# Patient Record
Sex: Female | Born: 1945 | Race: White | Hispanic: No | Marital: Married | Smoking: Never smoker
Health system: Southern US, Community
[De-identification: ages and names within clinical notes are randomized; demographics above are authoritative.]

## PROBLEM LIST (undated history)

## (undated) DIAGNOSIS — G4730 Sleep apnea, unspecified: Secondary | ICD-10-CM

## (undated) DIAGNOSIS — Z9289 Personal history of other medical treatment: Secondary | ICD-10-CM

## (undated) DIAGNOSIS — M1990 Unspecified osteoarthritis, unspecified site: Secondary | ICD-10-CM

## (undated) DIAGNOSIS — F329 Major depressive disorder, single episode, unspecified: Secondary | ICD-10-CM

## (undated) DIAGNOSIS — K219 Gastro-esophageal reflux disease without esophagitis: Secondary | ICD-10-CM

## (undated) DIAGNOSIS — I509 Heart failure, unspecified: Secondary | ICD-10-CM

## (undated) DIAGNOSIS — E669 Obesity, unspecified: Secondary | ICD-10-CM

## (undated) DIAGNOSIS — I10 Essential (primary) hypertension: Secondary | ICD-10-CM

## (undated) DIAGNOSIS — F32A Depression, unspecified: Secondary | ICD-10-CM

## (undated) DIAGNOSIS — R51 Headache: Secondary | ICD-10-CM

## (undated) DIAGNOSIS — I2510 Atherosclerotic heart disease of native coronary artery without angina pectoris: Secondary | ICD-10-CM

## (undated) HISTORY — DX: Depression, unspecified: F32.A

## (undated) HISTORY — DX: Gastro-esophageal reflux disease without esophagitis: K21.9

## (undated) HISTORY — PX: CARDIAC CATHETERIZATION: SHX172

## (undated) HISTORY — DX: Obesity, unspecified: E66.9

## (undated) HISTORY — DX: Atherosclerotic heart disease of native coronary artery without angina pectoris: I25.10

## (undated) HISTORY — DX: Personal history of other medical treatment: Z92.89

## (undated) HISTORY — PX: TOTAL ABDOMINAL HYSTERECTOMY: SHX209

## (undated) HISTORY — DX: Essential (primary) hypertension: I10

## (undated) HISTORY — PX: BACK SURGERY: SHX140

## (undated) HISTORY — DX: Major depressive disorder, single episode, unspecified: F32.9

---

## 1998-05-31 ENCOUNTER — Other Ambulatory Visit: Admission: RE | Admit: 1998-05-31 | Discharge: 1998-05-31 | Payer: Self-pay | Admitting: Obstetrics and Gynecology

## 1999-04-14 ENCOUNTER — Other Ambulatory Visit: Admission: RE | Admit: 1999-04-14 | Discharge: 1999-04-14 | Payer: Self-pay | Admitting: Obstetrics and Gynecology

## 2000-05-05 ENCOUNTER — Other Ambulatory Visit: Admission: RE | Admit: 2000-05-05 | Discharge: 2000-05-05 | Payer: Self-pay | Admitting: Obstetrics and Gynecology

## 2000-05-16 ENCOUNTER — Other Ambulatory Visit: Admission: RE | Admit: 2000-05-16 | Discharge: 2000-05-16 | Payer: Self-pay | Admitting: Obstetrics and Gynecology

## 2000-05-16 ENCOUNTER — Encounter (INDEPENDENT_AMBULATORY_CARE_PROVIDER_SITE_OTHER): Payer: Self-pay | Admitting: Specialist

## 2000-06-30 ENCOUNTER — Encounter (INDEPENDENT_AMBULATORY_CARE_PROVIDER_SITE_OTHER): Payer: Self-pay | Admitting: Specialist

## 2000-06-30 ENCOUNTER — Other Ambulatory Visit: Admission: RE | Admit: 2000-06-30 | Discharge: 2000-06-30 | Payer: Self-pay | Admitting: *Deleted

## 2000-09-08 ENCOUNTER — Encounter: Payer: Self-pay | Admitting: *Deleted

## 2000-09-14 ENCOUNTER — Encounter (INDEPENDENT_AMBULATORY_CARE_PROVIDER_SITE_OTHER): Payer: Self-pay | Admitting: Specialist

## 2000-09-14 ENCOUNTER — Inpatient Hospital Stay (HOSPITAL_COMMUNITY): Admission: RE | Admit: 2000-09-14 | Discharge: 2000-09-15 | Payer: Self-pay | Admitting: *Deleted

## 2001-06-16 ENCOUNTER — Encounter: Payer: Self-pay | Admitting: Family Medicine

## 2001-06-16 ENCOUNTER — Inpatient Hospital Stay (HOSPITAL_COMMUNITY): Admission: AD | Admit: 2001-06-16 | Discharge: 2001-06-18 | Payer: Self-pay | Admitting: Family Medicine

## 2001-06-18 ENCOUNTER — Encounter: Payer: Self-pay | Admitting: Family Medicine

## 2001-08-21 ENCOUNTER — Ambulatory Visit (HOSPITAL_COMMUNITY): Admission: RE | Admit: 2001-08-21 | Discharge: 2001-08-21 | Payer: Self-pay | Admitting: Gastroenterology

## 2001-11-01 ENCOUNTER — Other Ambulatory Visit: Admission: RE | Admit: 2001-11-01 | Discharge: 2001-11-01 | Payer: Self-pay | Admitting: *Deleted

## 2003-07-01 ENCOUNTER — Encounter: Admission: RE | Admit: 2003-07-01 | Discharge: 2003-09-29 | Payer: Self-pay | Admitting: Family Medicine

## 2006-08-25 DIAGNOSIS — I2510 Atherosclerotic heart disease of native coronary artery without angina pectoris: Secondary | ICD-10-CM

## 2006-08-25 HISTORY — PX: CORONARY STENT PLACEMENT: SHX1402

## 2006-08-25 HISTORY — DX: Atherosclerotic heart disease of native coronary artery without angina pectoris: I25.10

## 2006-09-06 ENCOUNTER — Ambulatory Visit: Payer: Self-pay | Admitting: Internal Medicine

## 2006-09-06 ENCOUNTER — Observation Stay (HOSPITAL_COMMUNITY): Admission: EM | Admit: 2006-09-06 | Discharge: 2006-09-09 | Payer: Self-pay | Admitting: Emergency Medicine

## 2006-09-21 ENCOUNTER — Ambulatory Visit: Payer: Self-pay | Admitting: Cardiology

## 2006-10-17 ENCOUNTER — Observation Stay (HOSPITAL_COMMUNITY): Admission: EM | Admit: 2006-10-17 | Discharge: 2006-10-19 | Payer: Self-pay | Admitting: Emergency Medicine

## 2006-10-17 ENCOUNTER — Ambulatory Visit: Payer: Self-pay | Admitting: Cardiology

## 2006-12-07 ENCOUNTER — Ambulatory Visit: Payer: Self-pay | Admitting: Internal Medicine

## 2007-07-16 ENCOUNTER — Ambulatory Visit (HOSPITAL_COMMUNITY): Admission: RE | Admit: 2007-07-16 | Discharge: 2007-07-16 | Payer: Self-pay | Admitting: Ophthalmology

## 2007-12-06 ENCOUNTER — Emergency Department (HOSPITAL_COMMUNITY): Admission: EM | Admit: 2007-12-06 | Discharge: 2007-12-07 | Payer: Self-pay | Admitting: Emergency Medicine

## 2009-04-16 ENCOUNTER — Telehealth (INDEPENDENT_AMBULATORY_CARE_PROVIDER_SITE_OTHER): Payer: Self-pay | Admitting: *Deleted

## 2010-04-14 ENCOUNTER — Encounter: Admission: RE | Admit: 2010-04-14 | Discharge: 2010-04-14 | Payer: Self-pay | Admitting: Endocrinology

## 2010-09-21 ENCOUNTER — Encounter: Payer: Self-pay | Admitting: Cardiology

## 2010-10-06 ENCOUNTER — Encounter: Payer: Self-pay | Admitting: Cardiology

## 2010-10-06 ENCOUNTER — Ambulatory Visit (INDEPENDENT_AMBULATORY_CARE_PROVIDER_SITE_OTHER): Payer: PRIVATE HEALTH INSURANCE | Admitting: Cardiology

## 2010-10-06 DIAGNOSIS — R0602 Shortness of breath: Secondary | ICD-10-CM | POA: Insufficient documentation

## 2010-10-06 DIAGNOSIS — I2510 Atherosclerotic heart disease of native coronary artery without angina pectoris: Secondary | ICD-10-CM | POA: Insufficient documentation

## 2010-10-06 DIAGNOSIS — I10 Essential (primary) hypertension: Secondary | ICD-10-CM

## 2010-10-06 DIAGNOSIS — I509 Heart failure, unspecified: Secondary | ICD-10-CM

## 2010-10-09 ENCOUNTER — Encounter: Payer: Self-pay | Admitting: Cardiology

## 2010-10-10 LAB — CONVERTED CEMR LAB
BUN: 16 mg/dL (ref 6–23)
CO2: 24 meq/L (ref 19–32)
Chloride: 104 meq/L (ref 96–112)
Creatinine, Ser: 0.79 mg/dL (ref 0.40–1.20)
Glucose, Bld: 80 mg/dL (ref 70–99)
Potassium: 4 meq/L (ref 3.5–5.3)

## 2010-10-12 ENCOUNTER — Telehealth: Payer: Self-pay | Admitting: Cardiology

## 2010-10-12 NOTE — Assessment & Plan Note (Signed)
Summary: np6/cad,sob,flutter/inclusive health/dr. Darl Pikes fuller per jes...  Medications Added CARVEDILOL 6.25 MG TABS (CARVEDILOL) Take one tablet by mouth twice a day AMLODIPINE BESYLATE 5 MG TABS (AMLODIPINE BESYLATE) Take one tablet by mouth daily FUROSEMIDE 40 MG TABS (FUROSEMIDE) Take one tablet by mouth daily for 5 days, then DECREASE to 1/2 tablet by mouth once daily. POTASSIUM CHLORIDE CR 10 MEQ CR-CAPS (POTASSIUM CHLORIDE) Take 2 tablets by mouth once daily for 5 days, then DECREASE to 1 tablet by mouth once daily.      Allergies Added: NKDA  Visit Type:  Initial Consult Primary Provider:  [name], P.A.   CC:  c/o fatigue, shortness of breath, and fluttering in chest with swelling in feet and face.Marland Kitchen  History of Present Illness: 65 yo with history of CAD, DM, HTN presents for evaluation of dyspnea, orthopnea, and lower extremity edema.  She had unstable angina in 2/08 with a stent placed in her distal CFX.  She has not had cardiology followup since 2008.  She did not have heart failure at that time, symptom with unstable angina was chest pain.    She has had exertional dyspnea for several months now but much worse for 2-3 weeks.  [name] short of breath walking from one room to the next at this point.  She has had significant orthopnea for the last 2 weeks and sometimes sleeps sitting up.  She has episodes consistent with PND.  She has had increased swelling in her legs for the last 2-3 weeks.  She gets occasional fleeting atypical chest pain but nothing prolonged or associated with exertion.   Blood pressure today is elevated at 160/64 and has been running high at her last few visits with doctors.  She does not check it at home. She has had a cough for about a month.    ECG: NSR, lateral T wave flattening, HR 58  Preventive Screening-Counseling & Management  Alcohol-Tobacco     Smoking Status: never  Caffeine-Diet-Exercise     Does Patient Exercise: no  Current Medications  (verified): 1)  Aspirin 325 Mg Tabs (Aspirin) .Marland Kitchen.. 1 Tablet Once Daily 2)  Actos 15 Mg Tabs (Pioglitazone Hcl) .Marland Kitchen.. 1 Tablet Once Daily 3)  Ropinirole Hcl 2 Mg Tabs (Ropinirole Hcl) .Marland Kitchen.. 1 Tablet Once Daily 4)  Metformin Hcl 1000 Mg Tabs (Metformin Hcl) .Marland Kitchen.. 1 Tablet Two Times A Day 5)  Lisinopril 40 Mg Tabs (Lisinopril) .Marland Kitchen.. 1 Tablet Once Daily 6)  Lyrica 75 Mg Caps (Pregabalin) .Marland Kitchen.. 1 Capsule Two Times A Day, For 30 Days 7)  Simvastatin 40 Mg Tabs (Simvastatin) .Marland Kitchen.. 1 Tablet At Bedtime 8)  Humalog 100 Unit/ml Soln (Insulin Lispro (Human)) .... As Directed 9)  Ibuprofen 800 Mg Tabs (Ibuprofen) .Marland Kitchen.. 1 Tablet As Needed 10)  Coreg 12.5 Mg Tabs (Carvedilol) .Marland Kitchen.. 1 Tablet Once Daily For 30 Days 11)  Lantus 100 Unit/ml Soln (Insulin Glargine) .... As Directed 12)  Zolpidem Tartrate 10 Mg Tabs (Zolpidem Tartrate) .Marland Kitchen.. 1 Tablet At Bedtime  Allergies (verified): No Known Drug Allergies  Past History:  Family History: Last updated: 10-10-10 Father: Deceased; MI in his 79s, CABG, CHF Mother: Alive; HTN, rhematoid arthritis, anemic  Social History: Last updated: 10/10/2010 Married, lives with husband in [place] disabled: diabetes & fibromyagia Tobacco Use - No.  Alcohol Use - no Regular Exercise - no  Risk Factors: Exercise: no (10/10/10)  Risk Factors: Smoking Status: never (10/10/10)  Past Medical History: 1.  Hypertension 2.  Depression 3.  DM 4.  Obesity 5.  GERD 6.  CAD: Unstable angina 2/08.  PCI to distal codominant CFX with 2.5 x 20 Taxus DES.   Past Surgical History: CAD; stent placed @ [facility] 08/2006  Family History: Father: Deceased; MI in his 35s, CABG, CHF Mother: Alive; HTN, rhematoid arthritis, anemic  Social History: Married, lives with husband in [place] disabled: diabetes & fibromyagia Tobacco Use - No.  Alcohol Use - no Regular Exercise - no Smoking Status:  never Does Patient Exercise:  no  Review of Systems       All systems  reviewed and negative except as per HPI.   Vital Signs:  Patient profile:   65 year old female Height:      65 inches Weight:      273 pounds BMI:     45.59 Pulse rate:   58 / minute BP sitting:   160 / 64  (left arm) Cuff size:   large  Vitals Entered By: Bishop Dublin, CMA (October 06, 2010 3:27 PM)  Physical Exam  General:  Well developed, well nourished, in no acute distress.  Obese.  Head:  normocephalic and atraumatic Nose:  no deformity, discharge, inflammation, or lesions Mouth:  Teeth, gums and palate normal. Oral mucosa normal. Neck:  Neck supple, JVP 14 cm. No masses, thyromegaly or abnormal cervical nodes. Lungs:  Crackles at bases bilaterally.  Heart:  Non-displaced PMI, chest non-tender; regular rate and rhythm, S1, S2 without rubs or gallops. 3/6 HSM at apex.  Carotid upstroke normal, no bruit. Pedals normal pulses. 1+ edema to knees bilaterally.  Abdomen:  Bowel sounds positive; abdomen soft and non-tender without masses, organomegaly, or hernias noted. No hepatosplenomegaly. Extremities:  No clubbing or cyanosis. Neurologic:  Alert and oriented x 3. Skin:  Intact without lesions or rashes. Psych:  Normal affect.   Impression & Recommendations:  Problem # 1:  CHF (ICD-428.0) Patient is volume overloaded today with NYHA class III symptoms.  Symptoms have come on gradually over the last few months but much worse last 2 weeks.  No rest symptoms.  She has only mild atypical chest pain.  She has what sounds like a mitral area murmur.  - Needs echocardiogram.  - Start Lasix 40 mg daily x 5 days + KCl 20 mEq daily x 5 days.  After 5 days, decrease Lasix to 20 mg daily and KCl to 10 mEq daily.  - BMET, BNP, TSH today; BMET/BNP in 2 wks.   - Change Coreg dosing to 6.25 mg two times a day (she has been taking 12.5 mg once daily).   - Continue ACEI.   Problem # 2:  CAD (ICD-414.00) Patient has history of CAD s/p PCI in 2/08.  She has had only mild, fleeting atypical chest  pain.  However, given her new onset CHF, I would be concerned for the possibility of ischemic cardiomyopathy.  I will get a Lexiscan myoview to assess for ischemia.  She will continue ASA and statin.  I will get lipids/LFTs with goal LDL < 70.   Problem # 3:  HYPERTENSION, UNSPECIFIED (ICD-401.9) BP running high.  Divide Coreg to 6.25 mg two times a day and continue lisinopril.  I will also add amlodipine 5 mg daily.    Followup in 2 weeks.  Go to the ER if symptoms worsen.   Other Orders: Nuclear Stress Test (Nuc Stress Test) Echocardiogram (Echo)  Patient Instructions: 1)  Your physician recommends that you schedule a follow-up appointment in: 2 WEEKS 2)  Your physician recommends that you return for a FASTING lipid profile: Lipid/Lft/BMP/BNP IN 2 WEEKS 3)  Your physician has recommended you make the following change in your medication: DECREASE  Coreg to 6.25 two times a day. START Amlodipine 5mg  once daily. START Lasix 40mg  1/2 tablet once daily x 5 days then 1 tablet once daily. START Potassium 2 tablets by mouth x 5 days then DECREASE to 1 tablet once daily. 4)  Your physician has requested that you have an echocardiogram.  Echocardiography is a painless test that uses sound waves to create images of your heart. It provides your doctor with information about the size and shape of your heart and how well your heart's chambers and valves are working.  This procedure takes approximately one hour. There are no restrictions for this procedure. 5)  Your physician has requested that you have an exercise stress myoview.  For further information please visit [email or web address]  Please follow instruction sheet, as given. Prescriptions: POTASSIUM CHLORIDE CR 10 MEQ CR-CAPS (POTASSIUM CHLORIDE) Take 2 tablets by mouth once daily for 5 days, then DECREASE to 1 tablet by mouth once daily.  #30 x 6   Entered by:   Lanny Hurst RN   Authorized by:   [name], MD   Signed by:   Lanny Hurst RN  on 10/06/2010   Method used:   Electronically to        [facility] (retail)       [address]       [place], [place]  [address]       Ph: [phone]       Fax: [phone]   RxID:   [id number] FUROSEMIDE 40 MG TABS (FUROSEMIDE) Take one tablet by mouth daily for 5 days, then DECREASE to 1/2 tablet by mouth once daily.  #30 x 6   Entered by:   Lanny Hurst RN   Authorized by:   [name], MD   Signed by:   Lanny Hurst RN on 10/06/2010   Method used:   Electronically to        [facility] (retail)       [address]       [place], [place]  [address]       Ph: [phone]       Fax: [phone]   RxID:   [id number] AMLODIPINE BESYLATE 5 MG TABS (AMLODIPINE BESYLATE) Take one tablet by mouth daily  #30 x 6   Entered by:   Lanny Hurst RN   Authorized by:   [name], MD   Signed by:   Lanny Hurst RN on 10/06/2010   Method used:   Electronically to        [facility] (retail)       [address]       [place], [place]  [address]       Ph: [phone]       Fax: [phone]   RxID:   [id number] CARVEDILOL 6.25 MG TABS (CARVEDILOL) Take one tablet by mouth twice a day  #60 x 6   Entered by:   Lanny Hurst RN   Authorized by:   [name], MD   Signed by:   Lanny Hurst RN on 10/06/2010   Method used:   Electronically to  [facility]       [address]       [place], [place]  [address]       Ph: [phone]       Fax: [phone]   RxID:   [id number]   Appended Document: Orders Update    Clinical Lists Changes  Problems: Added new problem of SHORTNESS OF BREATH (ICD-786.05) Orders: Added new Test order of T-Basic Metabolic Panel [phone]) - Signed Added new Test order of T-BNP  (B Natriuretic Peptide) [phone]) - Signed Added new Test order of T-TSH ([id number]) - Signed

## 2010-10-12 NOTE — Telephone Encounter (Signed)
Patient: Krista Wise Note: All result statuses are Final unless otherwise noted.  Tests: (1) B Natriuretic Peptide ([id number])  B Natriuretic Peptide                        [H]  417.5 pg/mL                 0.0-100.0  Note: An exclamation mark (!) indicates a result that was not dispersed into the flowsheet. Document Creation Date: 10/07/2010 11:18 AM _______________________________________________________________________  (1) Order result status: Final Collection or observation date-time: 10/06/2010 23:00 Requested date-time: 10/06/2010 00:00 Receipt date-time: 10/06/2010 23:00 Reported date-time: 10/07/2010 11:18 Referring Physician:   Ordering Physician:  Gala Romney ([id number]) Specimen Source:  Source: Ursula Alert Order Number: [id number] Lab site: [facility], [facility]     [address]     [place]  [place]  [address]   Signed by Dolores Patty, MD, [facility] on 10/10/2010 at 11:12 AM  Signed by [name], MD on 10/10/2010 at 3:39 PM ________________________________________________________________________ elevated BNP, started lasix.    Signed by [name], MD on 10/10/2010 at 3:39 PM  ________________________________________________________________________     Patient: Krista Wise Note: All result statuses are Final unless otherwise noted.  Tests: (1) TSH (23280)   TSH                  [H]  5.694 uIU/mL                0.350-4.500  Note: An exclamation mark (!) indicates a result that was not dispersed into the flowsheet. Document Creation Date: 10/07/2010 11:18 AM _______________________________________________________________________  (1) Order result status: Final Collection or observation date-time: 10/06/2010 23:00 Requested date-time: 10/06/2010 00:00 Receipt date-time: 10/06/2010 23:00 Reported date-time: 10/07/2010 11:18 Referring Physician:   Ordering Physician:  Gala Romney ([id number]) Specimen Source:  Source:  Ursula Alert Order Number: [id number] Lab site: [facility], [facility]     [address]     [place]  [place]  [address]   Signed by Dolores Patty, MD, [facility] on 10/10/2010 at 11:13 AM  Signed by [name], MD on 10/10/2010 at 3:39 PM ________________________________________________________________________ needs free T3 and free T4, also repeat TSH   Signed by [name], MD on 10/10/2010 at 3:40 PM  ________________________________________________________________________    Patient: Krista Wise Note: All result statuses are Final unless otherwise noted.  Tests: (1) B Natriuretic Peptide (56387)  B Natriuretic Peptide                        [H]  417.5 pg/mL                 0.0-100.0  Note: An exclamation mark (!) indicates a result that was not dispersed into the flowsheet. Document Creation Date: 10/07/2010 11:18 AM _______________________________________________________________________  (1) Order result status: Final Collection or observation date-time: 10/06/2010 23:00 Requested date-time: 10/06/2010 00:00 Receipt date-time: 10/06/2010 23:00 Reported date-time: 10/07/2010 11:18 Referring Physician:   Ordering Physician:  Gala Romney ([id number]) Specimen Source:  Source: Ursula Alert Order Number: [id number] Lab site: [facility], [facility]     [address]     [place]  [place]  [address]   Signed by Dolores Patty, MD, [facility] on 10/10/2010 at 11:12 AM  Signed by [name], MD on 10/10/2010 at 3:39 PM ________________________________________________________________________ elevated BNP, started lasix.    Signed by [name], MD on 10/10/2010  at 3:39 PM  ________________________________________________________________________

## 2010-10-15 NOTE — Telephone Encounter (Signed)
Free T3, T4, and TSH added to pt's bloodwork.

## 2010-10-19 ENCOUNTER — Other Ambulatory Visit (HOSPITAL_COMMUNITY): Payer: Self-pay | Admitting: Cardiology

## 2010-10-19 DIAGNOSIS — I509 Heart failure, unspecified: Secondary | ICD-10-CM

## 2010-10-20 ENCOUNTER — Ambulatory Visit (HOSPITAL_BASED_OUTPATIENT_CLINIC_OR_DEPARTMENT_OTHER): Payer: PRIVATE HEALTH INSURANCE | Admitting: Radiology

## 2010-10-20 ENCOUNTER — Ambulatory Visit (HOSPITAL_COMMUNITY): Payer: PRIVATE HEALTH INSURANCE | Attending: Cardiology | Admitting: Radiology

## 2010-10-20 ENCOUNTER — Other Ambulatory Visit: Payer: PRIVATE HEALTH INSURANCE | Admitting: *Deleted

## 2010-10-20 ENCOUNTER — Other Ambulatory Visit (INDEPENDENT_AMBULATORY_CARE_PROVIDER_SITE_OTHER): Payer: PRIVATE HEALTH INSURANCE | Admitting: *Deleted

## 2010-10-20 DIAGNOSIS — I2510 Atherosclerotic heart disease of native coronary artery without angina pectoris: Secondary | ICD-10-CM

## 2010-10-20 DIAGNOSIS — I509 Heart failure, unspecified: Secondary | ICD-10-CM

## 2010-10-20 DIAGNOSIS — E119 Type 2 diabetes mellitus without complications: Secondary | ICD-10-CM

## 2010-10-20 DIAGNOSIS — R0609 Other forms of dyspnea: Secondary | ICD-10-CM

## 2010-10-20 DIAGNOSIS — R011 Cardiac murmur, unspecified: Secondary | ICD-10-CM

## 2010-10-20 DIAGNOSIS — R0989 Other specified symptoms and signs involving the circulatory and respiratory systems: Secondary | ICD-10-CM

## 2010-10-20 DIAGNOSIS — R0789 Other chest pain: Secondary | ICD-10-CM

## 2010-10-20 LAB — HEPATIC FUNCTION PANEL
AST: 15 U/L (ref 0–37)
Alkaline Phosphatase: 76 U/L (ref 39–117)
Bilirubin, Direct: 0.1 mg/dL (ref 0.0–0.3)
Total Protein: 7.1 g/dL (ref 6.0–8.3)

## 2010-10-20 LAB — BASIC METABOLIC PANEL
BUN: 18 mg/dL (ref 6–23)
CO2: 30 mEq/L (ref 19–32)
Chloride: 103 mEq/L (ref 96–112)
Creatinine, Ser: 0.7 mg/dL (ref 0.4–1.2)
Glucose, Bld: 193 mg/dL — ABNORMAL HIGH (ref 70–99)
Potassium: 4.4 mEq/L (ref 3.5–5.1)

## 2010-10-20 LAB — CBC WITH DIFFERENTIAL/PLATELET
Eosinophils Absolute: 0.2 10*3/uL (ref 0.0–0.7)
Eosinophils Relative: 3.1 % (ref 0.0–5.0)
HCT: 44 % (ref 36.0–46.0)
Lymphs Abs: 0.9 10*3/uL (ref 0.7–4.0)
MCHC: 32.7 g/dL (ref 30.0–36.0)
MCV: 81.2 fl (ref 78.0–100.0)
Monocytes Absolute: 0.4 10*3/uL (ref 0.1–1.0)
Neutrophils Relative %: 75.8 % (ref 43.0–77.0)
Platelets: 190 10*3/uL (ref 150.0–400.0)
RDW: 16.3 % — ABNORMAL HIGH (ref 11.5–14.6)
WBC: 6.5 10*3/uL (ref 4.5–10.5)

## 2010-10-20 LAB — LIPID PANEL
Cholesterol: 167 mg/dL (ref 0–200)
Total CHOL/HDL Ratio: 4

## 2010-10-20 LAB — LDL CHOLESTEROL, DIRECT: Direct LDL: 96.5 mg/dL

## 2010-10-20 MED ORDER — REGADENOSON 0.4 MG/5ML IV SOLN
0.4000 mg | Freq: Once | INTRAVENOUS | Status: AC
  Administered 2010-10-20: 0.4 mg via INTRAVENOUS

## 2010-10-20 MED ORDER — TECHNETIUM TC 99M TETROFOSMIN IV KIT
33.0000 | PACK | Freq: Once | INTRAVENOUS | Status: AC | PRN
  Administered 2010-10-20: 33 via INTRAVENOUS

## 2010-10-20 MED ORDER — TECHNETIUM TC 99M TETROFOSMIN IV KIT
11.0000 | PACK | Freq: Once | INTRAVENOUS | Status: AC | PRN
  Administered 2010-10-20: 11 via INTRAVENOUS

## 2010-10-20 NOTE — Progress Notes (Signed)
[facility] SITE 3 NUCLEAR MED [address] [place] [address] [phone]  Cardiology Nuclear Med Study Krista Wise female 1946/07/23   Nuclear Med Background Indication for Stress Test:  Evaluation for Ischemia and Stent Patency History:  2/08 Stent-CFX, 3/08 CATH:patent stent, EF=65% Cardiac Risk Factors: Family History - CAD, Hypertension, IDDM Type 2, Lipids and Obesity  Symptoms:  Chest Tightness with and without Exertion (last date of chest discomfort was 3-4 days ago), Diaphoresis, Dizziness, DOE, Fatigue, Near Syncope and Rapid HR   Nuclear Pre-Procedure Caffeine/Decaff Intake:  7:00pm NPO After: 8:00pm   Lungs:  Clear IV 0.9% NS with Angio Cath:  20g  IV Site: R Antecubital  IV Started by:  Stanton Kidney, EMT-P  Chest Size (in):  44 Cup Size:C+  Height: 5\' 5"  (1.651 m)  Weight:  265 lb (120.203 kg)  BMI:  Body mass index is 44.10 kg/(m^2). Tech Comments:  n/a    Nuclear Med Study 1 or 2 day study: 1 day  Stress Test Type:  Lexiscan  Reading MD: Cassell Clement, MD  Order Authorizing Provider: [name], MD  Resting Radionuclide: Technetium 16m Tetrofosmin  Resting Radionuclide Dose: 11 mCi   Stress Radionuclide:  Technetium 1m Tetrofosmin  Stress Radionuclide Dose: 33 mCi           Stress Protocol Rest HR: 69 Stress HR: 114  Rest BP: 160/65 Stress BP: 189/78  Exercise Time:  4:21 min METS: 4.5  Predicted HR: 158 % of Maximum: 72%        Dose of Adenosine:  n/a Dose of Lexiscan:  0.4 mg  Dose of Atropine:  n/a Dose of Dobutamine:  n/a  Stress Test Technologist: Rea [employer], CMA-N  Nuclear Technologist:  Doyne Keel, CNMT     Rest Procedure:  Myocardial perfusion imaging was performed at rest 45 minutes following the intravenous administration of Technetium 51m Tetrofosmin. Rest ECG: No acute changes.  Stress Procedure:  The patient attempted to walk the treadmill utilizing the Bruce protocol for 3:15, but was unable to  obtain her target heart rate due to knee pain and dyspnea.  Her O2 sat  was 88%.  She was then given IV Lexiscan 0.4 mg over 15-seconds with concurrent low level exercise and then Technetium 108m Tetrofosmin was injected at 30-seconds while the patient continued walking   There were nonspecific ST-T wave changes prior to giving Lexiscan.  She did c/o chest tightness, 6/10.  Quantitative spect images were obtained after a 45-minute delay.  Discussed images with Dr. Shirlee Latch and patient is scheduled to see him tomorrow, 10/21/10. Stress ECG: Significant ST abnormalities consistent with ischemia.  QPS Raw Data Images:  Normal; no motion artifact; normal heart/lung ratio. Stress Images:  There is decreased uptake in the lateral wall. Rest Images:  There is decreased uptake in the lateral wall. Subtraction (SDS):  These findings are consistent with ischemia.  Findings Risk Category:  High risk nuclear study. Clinically Abnormal:  Chest Pain Ischemia:  lateral Fixed Defect:  Lateral LV Dysfunction:  Yes Transient Ischemic Dilatation (Normal <1.22):  1.17 Lung/Heart Ratio (Normal <0.45):  0.35  Quantitative Gated Spect Images QGS EDV:  128 ml QGS ESV:  49 ml QGS cine images:  No wall motion abnormalities QGS EF:  62 %  Impression Exercise Capacity:  Lexiscan with low level exercise. BP Response:  Normal blood pressure response. Clinical Symptoms:  Significant chest pain. ECG Impression:  Significant ST abnormalities consistent with ischemia. Comparison with Prior  Nuclear Study: No previous nuclear study performed  Overall Impression:  High risk stress nuclear study.  There is a small fixed lateral defect with significant surrounding lateral reversible ischemia.

## 2010-10-21 ENCOUNTER — Ambulatory Visit (INDEPENDENT_AMBULATORY_CARE_PROVIDER_SITE_OTHER): Payer: PRIVATE HEALTH INSURANCE | Admitting: Cardiology

## 2010-10-21 ENCOUNTER — Encounter: Payer: Self-pay | Admitting: Cardiology

## 2010-10-21 ENCOUNTER — Encounter: Payer: Self-pay | Admitting: *Deleted

## 2010-10-21 DIAGNOSIS — I10 Essential (primary) hypertension: Secondary | ICD-10-CM

## 2010-10-21 DIAGNOSIS — I509 Heart failure, unspecified: Secondary | ICD-10-CM

## 2010-10-21 DIAGNOSIS — E785 Hyperlipidemia, unspecified: Secondary | ICD-10-CM

## 2010-10-21 DIAGNOSIS — I2510 Atherosclerotic heart disease of native coronary artery without angina pectoris: Secondary | ICD-10-CM

## 2010-10-21 LAB — PROTIME-INR: Prothrombin Time: 11.4 s (ref 10.2–12.4)

## 2010-10-21 MED ORDER — ATORVASTATIN CALCIUM 40 MG PO TABS: 40.0000 mg | ORAL_TABLET | Freq: Every day | ORAL | Status: DC

## 2010-10-21 MED ORDER — CARVEDILOL 12.5 MG PO TABS: 12.5000 mg | ORAL_TABLET | Freq: Two times a day (BID) | ORAL | Status: DC

## 2010-10-21 NOTE — Assessment & Plan Note (Signed)
Goal LDL < 70.  She was above goal when cholesterol was checked in 3/12.  Need to replace simvastatin with atorvastatin 40 mg daily.  Lipids/LFTs in 2 months.

## 2010-10-21 NOTE — Assessment & Plan Note (Signed)
Significant ischemia on myoview in the lateral wall.  I suspect that this is a major factor in her exertional dyspnea.  She has known CAD s/p prior PCI.  She will continue ASA.  I am going to change simvastatin to Lipitor and will also increase Coreg to 12.5 mg bid.  [name] taking lisinopril.  She has no rest symptoms (only exertional).  I am going to take her for left heart cath next Tuesday.  Will use radial approach.

## 2010-10-21 NOTE — Patient Instructions (Addendum)
Stop Simvastatin.  Start Lipitor 40mg  daily.  Increase Coreg(carvedilol) to 12.5mg  twice a day.  Cardiac Cath Tuesday April 3,2012. See instruction sheet given to you today.  Lab today--PT 414.01   401.9  428.0  Appointment with Dr Shirlee Latch in about 3 weeks.

## 2010-10-21 NOTE — Assessment & Plan Note (Signed)
Diastolic CHF with preserved LV systolic function but moderate pulmonary hypertension.  Weight is down 7 lbs with Lasix and volume status looks better (no JVD now).  I suspect that her ongoing dyspnea with exertion is due to ischemia.  I will arrange for left heart cath.

## 2010-10-21 NOTE — Progress Notes (Signed)
65 yo with history of CAD, DM, HTN, and diastolic CHF presents for followup of dyspnea, orthopnea, and lower extremity edema.  She had unstable angina in 2/08 with a stent placed in her distal CFX.  I saw her recently in the office due to severe exertional dyspnea (short of breath just walking around her house).  She was volume overloaded on exam and hypertensive.  I uptitrated her BP meds and started Lasix.  She was set up for myoview and echo.  The myoview was ischemic by both ECG and perfusion images.  There was significant lateral ischemia.  Echo showed preserved EF (55-60%), but there was moderate pulmonary hypertension suggesting likely diastolic LV dysfunction.   Lasix has helped a lot with Krista Wise's swelling.  She has lost 7 lbs.  [name] still, however, short of breath just walking around her house.  She has orthopnea.  She gets occasional sharp atypical chest pain that lasts for only a few seconds at a time.  She has had no significant symptoms at rest.   Labs (3/12): BNP 417 => 309, K 4.4, creatinine 0.7, HCT 44, free T3/T4 normal, TSH normal, HDL 38, LDL 96.5  Allergies (verified):  No Known Drug Allergies  Family History: Father: Deceased; MI in his 67s, CABG, CHF Mother: Alive; HTN, rhematoid arthritis, anemic  Social History: Married, lives with husband in [place] disabled: diabetes & fibromyagia Tobacco Use - No.  Alcohol Use - no Regular Exercise - no  Past Medical History: 1.  Hypertension 2.  Depression 3.  DM 4.  Obesity 5.  GERD 6.  CAD: Unstable angina 2/08.  PCI to distal codominant CFX with 2.5 x 20 Taxus DES.  Lexiscan myoview (3/12): chest pain and dyspnea with stress, EF 62%, small fixed lateral perfusion defect with significant surrounding reversible perfusion defect suggestive of significant ischemia, ischemic ECG response.   7.  Fibromyalgia 8.  Diastolic CHF: Echo (3/12) with EF 55-60%, mild LV hypertrophy, mild aortic stenosis (mean gradient 13  mmHg), mild MR, normal RV size and systolic function, PA systolic pressure 55 mmHg.  9.  Aortic stenosis: Mild by echo 3/12, mean gradient 13 mmHg.   Review of Systems        All systems reviewed and negative except as per HPI.   Current Outpatient Prescriptions  Medication Sig Dispense Refill  . amLODipine (NORVASC) 5 MG tablet Take 5 mg by mouth daily.        Marland Kitchen aspirin 325 MG tablet Take 325 mg by mouth daily.        . furosemide (LASIX) 40 MG tablet Take 40 mg by mouth. Take 1 tablet daily for 5 days, the DECREASE to 1/2 tablet once daily       . ibuprofen (ADVIL,MOTRIN) 800 MG tablet Take 800 mg by mouth as needed.        . insulin glargine (LANTUS) 100 UNIT/ML injection Inject into the skin as directed.        . insulin lispro (HUMALOG) 100 UNIT/ML injection Inject into the skin as directed.        Marland Kitchen lisinopril (PRINIVIL,ZESTRIL) 40 MG tablet Take 40 mg by mouth daily.        . metFORMIN (GLUCOPHAGE) 1000 MG tablet Take 1,000 mg by mouth 2 (two) times daily.        . pioglitazone (ACTOS) 15 MG tablet Take 15 mg by mouth daily.        . potassium chloride (KLOR-CON) 10 MEQ CR tablet Take  10 mEq by mouth. Take 2 tablets for 5 days, then DECREASE to 1 tablet once daily.       . pregabalin (LYRICA) 75 MG capsule Take 75 mg by mouth 2 (two) times daily.        Marland Kitchen rOPINIRole (REQUIP) 2 MG tablet Take 2 mg by mouth daily.        Marland Kitchen zolpidem (AMBIEN) 10 MG tablet Take 10 mg by mouth at bedtime as needed.        Marland Kitchen DISCONTD: carvedilol (COREG) 6.25 MG tablet Take 6.25 mg by mouth 2 (two) times daily.        Marland Kitchen DISCONTD: simvastatin (ZOCOR) 40 MG tablet Take 40 mg by mouth at bedtime.        Marland Kitchen atorvastatin (LIPITOR) 40 MG tablet Take 1 tablet (40 mg total) by mouth daily.  30 tablet  3  . carvedilol (COREG) 12.5 MG tablet Take 1 tablet (12.5 mg total) by mouth 2 (two) times daily.  60 tablet  6   No current facility-administered medications for this visit.   Facility-Administered Medications  Ordered in Other Visits  Medication Dose Route Frequency Provider Last Rate Last Dose  . regadenoson (LEXISCAN) injection SOLN 0.4 mg  0.4 mg Intravenous Once [name], MD   0.4 mg at 10/20/10 1214  . technetium tetrofosmin (TC-MYOVIEW) injection 33 milli Curie  33 milli Curie Intravenous Once PRN Cassell Clement, MD   33 milli Curie at 10/20/10 1215    BP 172/75  Pulse 72  Ht 5\' 5"  (1.651 m)  Wt 266 lb 4 oz (120.77 kg)  BMI 44.31 kg/m2 General:  Well developed, well nourished, in no acute distress.  Obese.  Neck:  Neck supple, JVP 7 cm (improved). No masses, thyromegaly or abnormal cervical nodes. Lungs:  Clear bilaterally Heart:  Non-displaced PMI, chest non-tender; regular rate and rhythm, S1, S2 without rubs or gallops. 2/6 early systolic murmur RUSB.  Carotid upstroke normal, no bruit. Pedals normal pulses. Trace ankle edema.  Abdomen:  Bowel sounds positive; abdomen soft and non-tender without masses, organomegaly, or hernias noted. No hepatosplenomegaly. Extremities:  No clubbing or cyanosis. Neurologic:  Alert and oriented x 3. Psych:  Normal affect.

## 2010-10-21 NOTE — Assessment & Plan Note (Signed)
Increasing Coreg to 12.5 mg bid today.

## 2010-10-25 ENCOUNTER — Ambulatory Visit: Payer: Self-pay | Admitting: Cardiology

## 2010-10-26 ENCOUNTER — Ambulatory Visit (HOSPITAL_COMMUNITY)
Admission: RE | Admit: 2010-10-26 | Discharge: 2010-10-27 | Disposition: A | Discharge status: Home / Self Care | Payer: PRIVATE HEALTH INSURANCE | Source: Ambulatory Visit | Attending: Cardiology | Admitting: Cardiology

## 2010-10-26 DIAGNOSIS — I2510 Atherosclerotic heart disease of native coronary artery without angina pectoris: Secondary | ICD-10-CM | POA: Insufficient documentation

## 2010-10-26 DIAGNOSIS — Z794 Long term (current) use of insulin: Secondary | ICD-10-CM | POA: Insufficient documentation

## 2010-10-26 DIAGNOSIS — I2582 Chronic total occlusion of coronary artery: Secondary | ICD-10-CM | POA: Insufficient documentation

## 2010-10-26 DIAGNOSIS — R0609 Other forms of dyspnea: Secondary | ICD-10-CM | POA: Insufficient documentation

## 2010-10-26 DIAGNOSIS — Z01810 Encounter for preprocedural cardiovascular examination: Secondary | ICD-10-CM | POA: Insufficient documentation

## 2010-10-26 DIAGNOSIS — E669 Obesity, unspecified: Secondary | ICD-10-CM | POA: Insufficient documentation

## 2010-10-26 DIAGNOSIS — Z23 Encounter for immunization: Secondary | ICD-10-CM | POA: Insufficient documentation

## 2010-10-26 DIAGNOSIS — E119 Type 2 diabetes mellitus without complications: Secondary | ICD-10-CM | POA: Insufficient documentation

## 2010-10-26 DIAGNOSIS — F329 Major depressive disorder, single episode, unspecified: Secondary | ICD-10-CM | POA: Insufficient documentation

## 2010-10-26 DIAGNOSIS — I10 Essential (primary) hypertension: Secondary | ICD-10-CM | POA: Insufficient documentation

## 2010-10-26 DIAGNOSIS — I359 Nonrheumatic aortic valve disorder, unspecified: Secondary | ICD-10-CM | POA: Insufficient documentation

## 2010-10-26 DIAGNOSIS — K219 Gastro-esophageal reflux disease without esophagitis: Secondary | ICD-10-CM | POA: Insufficient documentation

## 2010-10-26 DIAGNOSIS — F3289 Other specified depressive episodes: Secondary | ICD-10-CM | POA: Insufficient documentation

## 2010-10-26 DIAGNOSIS — I5030 Unspecified diastolic (congestive) heart failure: Secondary | ICD-10-CM | POA: Insufficient documentation

## 2010-10-26 DIAGNOSIS — R0989 Other specified symptoms and signs involving the circulatory and respiratory systems: Secondary | ICD-10-CM | POA: Insufficient documentation

## 2010-10-26 DIAGNOSIS — I509 Heart failure, unspecified: Secondary | ICD-10-CM | POA: Insufficient documentation

## 2010-10-26 DIAGNOSIS — IMO0001 Reserved for inherently not codable concepts without codable children: Secondary | ICD-10-CM | POA: Insufficient documentation

## 2010-10-26 LAB — GLUCOSE, CAPILLARY
Glucose-Capillary: 153 mg/dL — ABNORMAL HIGH (ref 70–99)
Glucose-Capillary: 134 mg/dL — ABNORMAL HIGH (ref 70–99)
Glucose-Capillary: 232 mg/dL — ABNORMAL HIGH (ref 70–99)
Glucose-Capillary: 178 mg/dL — ABNORMAL HIGH (ref 70–99)

## 2010-10-27 DIAGNOSIS — R0989 Other specified symptoms and signs involving the circulatory and respiratory systems: Secondary | ICD-10-CM

## 2010-10-27 DIAGNOSIS — R0609 Other forms of dyspnea: Secondary | ICD-10-CM

## 2010-10-27 LAB — CBC
Hemoglobin: 11 g/dL — ABNORMAL LOW (ref 12.0–15.0)
MCH: 24.9 pg — ABNORMAL LOW (ref 26.0–34.0)
MCHC: 30.6 g/dL (ref 30.0–36.0)
MCV: 81.6 fL (ref 78.0–100.0)

## 2010-10-27 LAB — BASIC METABOLIC PANEL
BUN: 16 mg/dL (ref 6–23)
CO2: 31 mEq/L (ref 19–32)
Calcium: 8.9 mg/dL (ref 8.4–10.5)
Chloride: 99 mEq/L (ref 96–112)
Creatinine, Ser: 0.93 mg/dL (ref 0.4–1.2)
GFR calc Af Amer: >60 mL/min (ref >60)
Glucose, Bld: 197 mg/dL — ABNORMAL HIGH (ref 70–99)

## 2010-10-27 NOTE — Procedures (Signed)
NAMEADALYND, DONAHOE               ACCOUNT NO.:  [id number]  MEDICAL RECORD NO.:  [id number]           PATIENT TYPE:  O  LOCATION:  6526                         FACILITY:  [facility]  PHYSICIAN:  Verne Carrow, MDDATE OF BIRTH:  1946/02/16  DATE OF PROCEDURE:  10/26/2010 DATE OF DISCHARGE:                           CARDIAC CATHETERIZATION   PRIMARY CARDIOLOGIST:  [name], MD  PROCEDURE PERFORMED:  Percutaneous transluminal coronary angioplasty with placement of two drug-eluting stents in the mid circumflex coronary artery.  OPERATOR:  Verne Carrow, MD  INDICATION:  This is a 65 year old Caucasian female with a history of known coronary artery disease with recent complaints of dyspnea. Diagnostic catheterization was performed by Dr. Shirlee Latch and is dictated in a separate report.  The patient was found to have two severe lesions in the mid circumflex artery both before and after prior placed drug- eluting stent from 4 years ago.  She also was found to have a small- caliber totally occluded obtuse marginal branch that appeared to be chronic.  Dr. Shirlee Latch asked me to perform intervention in the circumflex artery.  DETAILS OF PROCEDURE:  When I entered the case, the patient had a 5- [place] sheath present in the right radial artery.  I changed the sheath out over a wire and inserted a 6-French sheath into the right radial artery.  Verapamil 3 mg was given through the sheath.  The patient was given a bolus of Angiomax and a drip was started.  We then used XB LAD 3.5 guiding catheter to selectively engage the left main artery.  When the ACT was greater than 200, I passed a cougar wire down the circumflex artery and into the distal portion of the most distal obtuse marginal branch.  A 2.5 x 15 mm balloon was used to predilate both the more distal lesion in the mid segment and the more proximal lesion in the mid segment.  A 2.5 x 16 mm Promus Element drug-eluting stent was  carefully positioned in the distal portion of the mid segment and was deployed without difficulty.  A 2.5 x 12 mm balloon was used to post dilate the lesion.  A 2.75 x 28 mm Promus Element drug-eluting stent was carefully positioned in the proximal vessel making sure to cover the severe stenosis as well as a moderately severe stenosis in the distal portion of the proximal segment.  This stent was deployed without difficulty.  A 3.0 x 20 mm noncompliant balloon was inflated twice inside the stented segment.  In the more distal portion in the stent, I went to 8 atmospheres; and in the more proximal portion, I went to 14 atmospheres. There was an excellent angiographic result.  The vessel was diffusely diseased beyond the more distal stent.  There was excellent flow into the distal vessel.  The patient tolerated the procedure well.  She was taken to the recovery room in stable condition.  IMPRESSION:  Successful percutaneous transluminal coronary angioplasty with placement of two drug-eluting stents in a non-overlapping fashion in the mid circumflex artery.  RECOMMENDATIONS:  The patient will be continued on aspirin and Plavix for at least 1 year.  We will continue her beta-blocker, ACE inhibitors, and statin.     Verne Carrow, MD     CM/MEDQ  D:  10/26/2010  T:  10/27/2010  Job:  [id number]  cc:   [name], MD  Electronically Signed by Verne Carrow MD on 10/27/2010 08:49:05 AM

## 2010-10-28 NOTE — Discharge Summary (Addendum)
NAMEJOSSLYN, CIOLEK               ACCOUNT NO.:  [id number]  MEDICAL RECORD NO.:  [id number]           PATIENT TYPE:  O  LOCATION:  6526                         FACILITY:  [facility]  PHYSICIAN:  Verne Carrow, MDDATE OF BIRTH:  January 05, 1946  DATE OF ADMISSION:  10/26/2010 DATE OF DISCHARGE:  10/27/2010                              DISCHARGE SUMMARY   DISCHARGE DIAGNOSES: 1. Exertional dyspnea with abnormal nuclear Myoview stress test as an     outpatient. 2. Coronary artery disease status post two drug-eluting stents placed     in a nonoverlapping fashion to the mid circumflex artery, October 26, 2010.     a.     Residual disease in the left anterior descending, right      coronary artery, and total occlusion of a moderate first obtuse      marginal.     b.     History of Taxus drug-eluting stent placement to the distal      circumflex, February 2008. 3. Diastolic heart failure with normal ejection fraction by cath,     October 26, 2010. 4. Hypertension. 5. Insulin-dependent diabetes. 6. Depression. 7. Obesity. 8. Gastroesophageal reflux disease. 9. Fibromyalgia. 10.Mild aortic stenosis.  HOSPITAL COURSE:  Krista Wise is a 65 year old female with a history of known CAD, diastolic heart failure, diabetes, and fibromyalgia who was recently seen in the office for severe exertional dyspnea, even with just walking around her house.  She was volume overloaded on exam and hypertensive.  Her blood pressure meds were uptitrated and Lasix was started.  She was set up for Myoview and echocardiogram.  Echo showed preserved EF, but there was moderate pulmonary hypertension suggesting likely diastolic LV dysfunction.  The Myoview was ischemic by both EKG and perfusion images with significant lateral wall ischemia.  She had done well since starting on the Lasix and had lost 7 pounds, but still complained of shortness of breath with even just walking around her house.  She was set up for  the catheterization on October 26, 2010, which demonstrated severe circumflex system disease with an 80-90% lesions in the mid circumflex both before and after prior placed stent.  She had total occlusion of the moderate first OM.  She also had residual RCA and LAD disease, please see full cath report.  Her EF was estimated at 55% without regional wall motion abnormalities.  Dr. Clifton James was called and performed PCI and subsequently placed two Promus drug-eluting stents in a nonoverlapping fashion in the mid circumflex artery, after which recommendations were made to continue aspirin and Plavix for at least 1 year.  The patient did well overnight and was observed.  She tolerated the procedure well.  Dr. Clifton James has seen and examined her today and feels she is stable for discharge.  DISCHARGE LABORATORY DATA:  WBC 7, hemoglobin 11, hematocrit 36, platelet count 224.  Sodium 138, potassium 3.8, chloride 99, CO2 is 31, glucose 197, BUN 16, creatinine 0.93.  STUDIES:  Cardiac catheterization on October 26, 2010, please see full report for details as well as HPI for summary.  DISCHARGE MEDICATIONS: 1. Plavix  75 mg daily. 2. Nitroglycerin sublingual 0.4 mg every 5 minutes as needed up to     three doses. 3. Aspirin 81 mg daily. 4. Ibuprofen 800 mg, only as needed with instructions that this     medicine can increase the risk of stomach bleeding while taking     medicines like aspirin and Plavix. 5. Silver sulfate 1% cream 1 application daily as needed for burn area     on stomach. 6. Actos 15 mg daily. 7. Amlodipine 5 mg daily. 8. Atorvastatin 40 mg daily. 9. Carvedilol 12.5 mg b.i.d. 10.Cymbalta 60 mg daily. 11.Insulin lispro 30 units t.i.d. before meals as needed. 12.Klor-Con 10 mEq daily. 13.Lantus 90 units subcutaneously every morning. 14.Lasix 40 mg daily. 15.Lisinopril 20 mg two tablets daily nightly. 16.Lyrica 75 mg b.i.d. 17.Metformin 1000 mg b.i.d. with instructions not to  restart until     October 29, 2010. 18.Ropinirole 2 mg nightly. 19.Zolpidem 10 mg nightly.  DISPOSITION:  [name] will be discharged in stable condition to home.  [name] not to lift anything over 5 pounds or participate in sexual activity for 1 week.  [name] to follow a low-salt heart-healthy diabetic diet and call or return if she notices any pain, swelling, bleeding, or pus at her cath site.  She will follow up with Dr. Shirlee Latch on a previously scheduled appointment on November 09, 2010, at 2:00 p.m. She was also seen by cardiac rehab to set up for cardiac rehab as an outpatient, and they have recommended to write for rolling walker which has been done.  DURATION OF DISCHARGE ENCOUNTER:  Greater than 30 minutes including physician and PA time.     Ronie Spies, P.A.C.   ______________________________ Verne Carrow, MD    DD/MEDQ  D:  10/27/2010  T:  10/27/2010  Job:  [id number]  cc:   [name], MD [name], PA  Electronically Signed by Verne Carrow MD on 10/28/2010 12:42:36 PM Electronically Signed by Ronie Spies  on 10/28/2010 01:30:46 PM

## 2010-11-02 LAB — GLUCOSE, CAPILLARY: Glucose-Capillary: 132 mg/dL — ABNORMAL HIGH (ref 70–99)

## 2010-11-08 ENCOUNTER — Encounter: Payer: Self-pay | Admitting: Cardiology

## 2010-11-08 NOTE — Procedures (Signed)
Krista Wise, Krista Wise               ACCOUNT NO.:  [id number]  MEDICAL RECORD NO.:  [id number]           PATIENT TYPE:  O  LOCATION:  6526                         FACILITY:  [facility]  PHYSICIAN:  [name], MD      DATE OF BIRTH:  14-Jan-1946  DATE OF PROCEDURE:  10/26/2010 DATE OF DISCHARGE:                           CARDIAC CATHETERIZATION   PROCEDURES: 1. Left heart catheterization. 2. Coronary angiography. 3. Left ventriculography.  INDICATIONS:  This is a 65 year old with diabetes and prior coronary artery disease who presented to clinic with congestive heart failure exacerbation.  She had an abnormal Myoview showing significant lateral ischemia.  PROCEDURE NOTE:  After informed consent was obtained, the patient underwent Allen testing on the right wrist.  The right ulnar artery provided good collateral circulation of the radial side of the hand constituting positive Allen test.  The patient's right wrist was then sterilely prepped and draped.  Lidocaine 1% was used to locally anesthetize the right radial area.  The right radial artery was entered using modified Seldinger technique and a 5-French arterial sheath was placed.  The patient received 3 mg of intraarterial verapamil and then 4000 units of IV heparin.  The right coronary artery was engaged using the JR-4 catheter.  The left coronary artery was engaged using the JL- 3.5 catheter.  Left ventricle was entered using angled pigtail catheter and no complications.  FINDINGS: 1. Hemodynamics:  Aorta 158/71.  LV 160/23. 2. Left ventriculography:  EF was estimated to be 55%.  There were no     regional wall motion abnormalities. 3. Right coronary artery:  The right coronary artery was a dominant     vessel.  There was 30% proximal RCA stenosis and about 30% mid RCA     stenosis. The distal PDA had a 70% stenosis at a branch point. 4. Left main:  The left main had no significant coronary artery     disease. 5. Left  circumflex system:  There was a total occlusion of a moderate-     sized first obtuse marginal close to the takeoff from the     circumflex.  This was followed by a small second obtuse marginal.     Just distal to the takeoff of a small second obtuse marginal     there was an 80-90% discrete stenosis in the circumflex which was     just proximal to a prior placed drug-eluting stent.  This stent had     about 30% in-stent restenosis.  Following the stent, there was     another 80-90% stenosis in the distal circumflex.  The circumflex     ended in a moderate-sized PLOM. 6. LAD system:  The LAD itself had about 40% midvessel stenosis.     There was a small first diagonal with about 80% ostial stenosis and     a small second diagonal with 90% ostial stenosis.  IMPRESSION:  This is a 65 year old with diabetes and diastolic congestive heart failure who had an abnormal Myoview showing significant lateral ischemia.  [name] found today to have severe circumflex system disease with 80-90% lesions in  the mid and distal circumflex both before and after a prior placed stent.  The patient has had total occlusion of a moderate first obtuse marginal.  We will plan percutaneous coronary intervention to the circumflex today.  Dr. Clifton James will perform the procedure.     [name], MD     DM/MEDQ  D:  10/26/2010  T:  10/27/2010  Job:  [id number]  cc:   Darl Pikes A. Toni Arthurs, [place]  Electronically Signed by [name] MD on 11/08/2010 09:11:16 AM

## 2010-11-09 ENCOUNTER — Ambulatory Visit (INDEPENDENT_AMBULATORY_CARE_PROVIDER_SITE_OTHER): Payer: PRIVATE HEALTH INSURANCE | Admitting: Cardiology

## 2010-11-09 ENCOUNTER — Encounter: Payer: Self-pay | Admitting: Cardiology

## 2010-11-09 DIAGNOSIS — I509 Heart failure, unspecified: Secondary | ICD-10-CM

## 2010-11-09 DIAGNOSIS — I2510 Atherosclerotic heart disease of native coronary artery without angina pectoris: Secondary | ICD-10-CM

## 2010-11-09 DIAGNOSIS — I5032 Chronic diastolic (congestive) heart failure: Secondary | ICD-10-CM

## 2010-11-09 DIAGNOSIS — E785 Hyperlipidemia, unspecified: Secondary | ICD-10-CM

## 2010-11-09 DIAGNOSIS — R0602 Shortness of breath: Secondary | ICD-10-CM

## 2010-11-09 MED ORDER — FUROSEMIDE 40 MG PO TABS: 40.0000 mg | ORAL_TABLET | Freq: Every day | ORAL | Status: DC

## 2010-11-09 MED ORDER — POTASSIUM CHLORIDE CRYS ER 20 MEQ PO TBCR: 20.0000 meq | EXTENDED_RELEASE_TABLET | Freq: Every day | ORAL | Status: DC

## 2010-11-09 MED ORDER — GLIPIZIDE 5 MG PO TABS: 5.0000 mg | ORAL_TABLET | Freq: Two times a day (BID) | ORAL | Status: DC

## 2010-11-09 NOTE — Patient Instructions (Signed)
Increase Lasix(furosemide) to 40mg  daily.    Increase  KCL(potassium) to  20 mEq daily.  Decrease Aspirin to 81mg  daily. This should be enteric coated.  Stop Actos.  Start Glipizide 5mg  twice a day.  Lab today--BMP/BNP 414.01  428.32  Lab in 2 weeks--BMP/BNP 414.01 428.32  Fasting lab in 2 months--lipid profile/liver profile--414.01  428.32  Schedule an appointment to see Dr Shirlee Latch in 1 month.  Dr Shirlee Latch has referred to you to the Cardiac  Rehab program at [facility].

## 2010-11-10 LAB — BASIC METABOLIC PANEL
BUN: 13 mg/dL (ref 6–23)
CO2: 32 mEq/L (ref 19–32)
Calcium: 9.3 mg/dL (ref 8.4–10.5)
Chloride: 100 mEq/L (ref 96–112)
Creatinine, Ser: 0.8 mg/dL (ref 0.4–1.2)
Glucose, Bld: 186 mg/dL — ABNORMAL HIGH (ref 70–99)

## 2010-11-10 NOTE — Progress Notes (Signed)
PCP: [name]  65 yo with history of CAD, DM, HTN, and diastolic CHF presents for followup of dyspnea, orthopnea, and lower extremity edema. She had unstable angina in 2/08 with a stent placed in her distal CFX. I saw her recently in the office due to severe exertional dyspnea (short of breath just walking around her house). She was volume overloaded on exam and hypertensive. I uptitrated her BP meds and started Lasix. She was set up for myoview and echo. The myoview was ischemic by both ECG and perfusion images. There was significant lateral ischemia. Echo showed preserved EF (55-60%), but there was moderate pulmonary hypertension suggesting likely diastolic LV dysfunction.  I took to the cath lab where left heart cath showed 80-90% mid and distal circumflex stenoses before and after the prior-placed stent and a totally occluded moderate-sized OM1.  She received 2 drug eluting stents in the CFX.   Since discharge from the hospital, she has continued to have dyspnea.  [name] short of breath walking around her house.  She has gained 2 lbs since last appointment.  She has no energy.  No chest pain.  Also of note, she has been taking Actos for about 6 months, and she noted the onset of lower extremity swelling 4-5 months ago.   Labs (3/12): BNP 417 => 309, K 4.4, creatinine 0.7, HCT 44, free T3/T4 normal, TSH normal, HDL 38, LDL 96.5   Allergies (verified):  No Known Drug Allergies   Family History:  Father: Deceased; MI in his 48s, CABG, CHF  Mother: Alive; HTN, rhematoid arthritis, anemic   Social History:  Married, lives with husband in [place]  disabled: diabetes & fibromyagia  Tobacco Use - No.  Alcohol Use - no  Regular Exercise - no   Past Medical History:  1. Hypertension  2. Depression  3. DM  4. Obesity  5. GERD  6. CAD: Unstable angina 2/08. PCI to distal codominant CFX with 2.5 x 20 Taxus DES. Lexiscan myoview (3/12): chest pain and dyspnea with stress, EF 62%, small  fixed lateral perfusion defect with significant surrounding reversible perfusion defect suggestive of significant ischemia, ischemic ECG response.  LHC (4/12): 80-90% mid and distal CFX stenosis before and after prior-placed stent, total occlusion of a moderate 1st OM.  Patient had DES x 2 to CFX.   7. Fibromyalgia  8. Diastolic CHF: Echo (3/12) with EF 55-60%, mild LV hypertrophy, mild aortic stenosis (mean gradient 13 mmHg), mild MR, normal RV size and systolic function, PA systolic pressure 55 mmHg.  9. Aortic stenosis: Mild by echo 3/12, mean gradient 13 mmHg.   Review of Systems  All systems reviewed and negative except as per HPI.   Current Outpatient Prescriptions  Medication Sig Dispense Refill  . atorvastatin (LIPITOR) 40 MG tablet Take 1 tablet (40 mg total) by mouth daily.  30 tablet  3  . carvedilol (COREG) 12.5 MG tablet Take 1 tablet (12.5 mg total) by mouth 2 (two) times daily.  60 tablet  6  . ibuprofen (ADVIL,MOTRIN) 800 MG tablet Take 800 mg by mouth as needed.        . insulin glargine (LANTUS) 100 UNIT/ML injection Inject 90 Units into the skin as directed.       . insulin lispro (HUMALOG) 100 UNIT/ML injection Inject 30 Units into the skin as directed.       Marland Kitchen lisinopril (PRINIVIL,ZESTRIL) 40 MG tablet Take 40 mg by mouth daily.        Marland Kitchen  metFORMIN (GLUCOPHAGE) 1000 MG tablet Take 1,000 mg by mouth 2 (two) times daily.        . pregabalin (LYRICA) 75 MG capsule Take 75 mg by mouth 2 (two) times daily.        Marland Kitchen rOPINIRole (REQUIP) 2 MG tablet Take 2 mg by mouth daily.        . simvastatin (ZOCOR) 40 MG tablet Take 40 mg by mouth at bedtime.        Marland Kitchen zolpidem (AMBIEN) 10 MG tablet Take 10 mg by mouth at bedtime as needed.        Marland Kitchen amLODipine (NORVASC) 5 MG tablet Take 5 mg by mouth daily.        Marland Kitchen aspirin EC 81 MG EC tablet Take 1 tablet (81 mg total) by mouth daily.      . clopidogrel (PLAVIX) 75 MG tablet Take 1 tablet (75 mg total) by mouth daily.      . furosemide  (LASIX) 40 MG tablet Take 1 tablet (40 mg total) by mouth daily.  30 tablet  11  . glipiZIDE (GLUCOTROL) 5 MG tablet Take 1 tablet (5 mg total) by mouth 2 (two) times daily before a meal.  60 tablet  6  . potassium chloride SA (KLOR-CON M20) 20 MEQ tablet Take 1 tablet (20 mEq total) by mouth daily.  30 tablet  6    BP 128/98  Pulse 68  Ht 5\' 5"  (1.651 m)  Wt 268 lb 1.9 oz (121.618 kg)  BMI 44.62 kg/m2 General: Well developed, well nourished, in no acute distress. Obese.  Neck: Neck supple, JVP 8-9 cm. No masses, thyromegaly or abnormal cervical nodes.  Lungs: Clear bilaterally  Heart: Non-displaced PMI, chest non-tender; regular rate and rhythm, S1, S2 without rubs or gallops. 2/6 early systolic murmur RUSB. Carotid upstroke normal, no bruit. Pedals normal pulses. 1+ ankle edema.  Abdomen: Bowel sounds positive; abdomen soft and non-tender without masses, organomegaly, or hernias noted. No hepatosplenomegaly.  Extremities: No clubbing or cyanosis.  Neurologic: Alert and oriented x 3.  Psych: Normal affect.

## 2010-11-10 NOTE — Assessment & Plan Note (Signed)
Diastolic CHF with preserved LV systolic function but moderate pulmonary hypertension.  She appears volume overloaded on exam, worse than at prior appointment, and weight is back up 2 lbs.   - Stop Actos, as this may be playing a role in volume retention.  I will have her take glipizide 5 mg bid and followup with her endocrinologist.  - Increase Lasix back to 40 mg daily and KCl to 20 mEq daily.  - BMET/BNP now and in 2 wks.   - Followup in 1 month.

## 2010-11-10 NOTE — Assessment & Plan Note (Signed)
S/p PCI with DES x 2 to CFX.  She will continue Plavix for at least a year and likely long-term given multiple drug eluting stents.  She may decrease ASA to 81 mg daily.  Continue statin, Coreg, and lisinopril. She will start cardiac rehab.

## 2010-11-10 NOTE — Assessment & Plan Note (Signed)
Goal LDL < 70.  Recently increased statin potency, check lipids/LFTs in 2 months.

## 2010-11-23 ENCOUNTER — Other Ambulatory Visit (INDEPENDENT_AMBULATORY_CARE_PROVIDER_SITE_OTHER): Payer: PRIVATE HEALTH INSURANCE | Admitting: *Deleted

## 2010-11-23 ENCOUNTER — Telehealth: Payer: Self-pay | Admitting: Cardiology

## 2010-11-23 ENCOUNTER — Other Ambulatory Visit (INDEPENDENT_AMBULATORY_CARE_PROVIDER_SITE_OTHER): Payer: PRIVATE HEALTH INSURANCE | Admitting: Cardiology

## 2010-11-23 DIAGNOSIS — I2510 Atherosclerotic heart disease of native coronary artery without angina pectoris: Secondary | ICD-10-CM

## 2010-11-23 DIAGNOSIS — E785 Hyperlipidemia, unspecified: Secondary | ICD-10-CM

## 2010-11-23 DIAGNOSIS — I5032 Chronic diastolic (congestive) heart failure: Secondary | ICD-10-CM

## 2010-11-23 DIAGNOSIS — R0602 Shortness of breath: Secondary | ICD-10-CM

## 2010-11-23 DIAGNOSIS — I509 Heart failure, unspecified: Secondary | ICD-10-CM

## 2010-11-23 LAB — HEPATIC FUNCTION PANEL
ALT: 11 U/L (ref 0–35)
AST: 17 U/L (ref 0–37)
Bilirubin, Direct: 0.1 mg/dL (ref 0.0–0.3)
Total Bilirubin: 0.3 mg/dL (ref 0.3–1.2)

## 2010-11-23 LAB — LDL CHOLESTEROL, DIRECT: Direct LDL: 83.7 mg/dL

## 2010-11-23 LAB — BASIC METABOLIC PANEL
BUN: 16 mg/dL (ref 6–23)
CO2: 28 mEq/L (ref 19–32)
Calcium: 8.8 mg/dL (ref 8.4–10.5)
Creatinine, Ser: 0.9 mg/dL (ref 0.4–1.2)
GFR: 68.11 mL/min (ref >60.00)
Glucose, Bld: 163 mg/dL — ABNORMAL HIGH (ref 70–99)

## 2010-11-23 LAB — LIPID PANEL
Total CHOL/HDL Ratio: 4
Triglycerides: 233 mg/dL — ABNORMAL HIGH (ref 0.0–149.0)

## 2010-11-23 NOTE — Telephone Encounter (Signed)
Walk In Pt Form" Pt would Like to set up Rehab" please call @ [phone] sent to Message Nurse 11/23/10/km

## 2010-11-25 ENCOUNTER — Telehealth: Payer: Self-pay | Admitting: *Deleted

## 2010-11-25 DIAGNOSIS — E785 Hyperlipidemia, unspecified: Secondary | ICD-10-CM

## 2010-11-25 MED ORDER — ATORVASTATIN CALCIUM 80 MG PO TABS: 80.0000 mg | ORAL_TABLET | Freq: Every day | ORAL | Status: DC

## 2010-11-25 NOTE — Telephone Encounter (Signed)
Pt rtn your call and she said you can call her back at the same number

## 2010-11-25 NOTE — Telephone Encounter (Signed)
Notes Recorded by Jacqlyn Krauss, RN on 11/25/2010 at 3:10 PM Discussed results with pt by telephone. She will increase Lipitor to 80mg  daily. She will return for fasting Lipid/Liver profile 01/21/11. ------  Notes Recorded by Jacqlyn Krauss, RN on 11/25/2010 at 9:16 AM LMTCB ------  Notes Recorded by [name], MD on 11/23/2010 at 11:17 PM LDL a little above goal, increase Lipitor to 80 mg daily with lipids/LFTs 2 months.

## 2010-11-25 NOTE — Telephone Encounter (Signed)
I called pt about lab results from 11/23/10. I talked with pt by telephone.

## 2010-12-07 NOTE — Op Note (Signed)
Krista Wise, Krista Wise               ACCOUNT NO.:  [id number]   MEDICAL RECORD NO.:  [id number]          PATIENT TYPE:  AMB   LOCATION:  SDS                          FACILITY:  [facility]   PHYSICIAN:  Alford Highland. Rankin, M.D.   DATE OF BIRTH:  08-06-1945   DATE OF PROCEDURE:  07/16/2007  DATE OF DISCHARGE:                               OPERATIVE REPORT   PREOPERATIVE DIAGNOSES:  1. Dense vitreous hemorrhage, right eye.  2. Progressive proliferative diabetic retinopathy, right eye.   POSTOPERATIVE DIAGNOSES:  1. Dense vitreous hemorrhage, right eye.  2. Progressive proliferative diabetic retinopathy, right eye.  3. Diabetic papillopathy, right eye.   PROCEDURE:  Posterior vitrectomy with endolaser  pan photocoagulation,  right eye - 25 gauge.   SURGEON:  Alford Highland. Rankin, M.D.   ANESTHESIA:  Left for local pharmacy control.   PROCEDURE:  The patient is a 65 year old woman has profound vision loss  on the basis of the vitreous and preretinal hemorrhage on the left eye.  The patient understands this is an attempt to induce quiescence of  diabetic retinopathy, laser photocoagulation but also to remove the  vitreous opacification to allow enhanced visual acuity on the right eye.  She understands the risks of anesthesia including rare occurrence of  death, loss of the eye including, but not limited to, from the  underlying condition a surgical repair hemorrhage, infection, scarring,  need for another surgery, no change in vision, loss of vision,  progressive disease despite intervention.  Appropriate signed consent  was obtained.  The patient taken to the operating room.  In the  operating __________ mild sedation.  Xylocaine 2% injected retrobulbar  for additional 5 mL left fascia modified [name].  The right periocular  region sterilely prepped and draped in the usual ophthalmic sterile  fashion.  Lid speculum applied.  A 25 gauge trocar placed in the  inferotemporal quadrant.  Superior  trocars applied.  Core vitrectomy was  again begun.  Physician induced posterior hyaloid detachment was not  necessary as this was largely created spontaneously.  The posterior  hyaloid was removed.  Posterior hyaloid was then trimmed 360 degrees and  then the vitreous base as well.  Preretinal hemorrhage was aspirated.  Peripheral and laser photocoagulation was present.  Posterior laser  photocoagulation from the mid periphery to the temporal arcades was then  completed.  No complications occurred.  The issues removed from the eye.  Superior trocars removed.  Excellent wound closure occurred.  The  infusion removed.  Subconjunctival Decadron applied.  Sterile patch and  Fox shield applied.  The patient tolerated the procedure well without  complication.  She was taken to the PACU in good and stable condition.     Alford Highland Rankin, M.D.  Electronically Signed    GAR/MEDQ  D:  07/16/2007  T:  07/17/2007  Job:  [id number]

## 2010-12-07 NOTE — Assessment & Plan Note (Signed)
[facility]                            CARDIOLOGY OFFICE NOTE   NAME:Wise, Krista SEGALL                 MRN:          [id number]  DATE:12/07/2006                            DOB:          1946/03/12    IDENTIFICATION:  Krista Wise is a 65 year old woman with a history of  coronary artery disease. Recently discharged from the hospital in March.  Had a repeat catheterization and a widely patent stent (that had been  placed in the circumflex in February). Also, had 90% lesion in the D1  and D2 small vessels. D3 had a 50% lesion. Circumflex again as noted.  Right coronary artery (RCA) had 20% lesion.   Since seen, she notes no shortness of breath. [name] trying to build up  on her walking. Still has chest pain 1 to 2 times per week lasting 15  minutes to an hour. It can occur with and without activity. Note, she  noted a lot of burping on the Nexium, sour burps. Also, complaining of  constipation when she does not take her laxatives.   CURRENT MEDICATIONS:  1. Nexium 40 daily.  2. Synthroid 137.  3. Insulin as directed.  4. Lisinopril 40.  5. Metformin 1 gram b.i.d.  6. Aspirin 325 daily.  7. Plavix 75 daily.  8. Stool softeners.  9. Simvastatin 20.  10.Requip 1-2 q nightly.  11.Lyrica 5 t.i.d.  12.Effexor 150 daily.   PHYSICAL EXAMINATION:  The patient is in no distress. Blood pressure is  132/82. Pulse 85. Weight is 174, down from 177 on last visit.  LUNGS:  Clear.  CARDIAC: Regular rate and rhythm, S1, S2. No S3.  EXTREMITIES: No edema.   IMPRESSION:  1. Coronary artery disease. Note, recent cath results as noted in      March. Again, I am not convinced her chest pain is anginal      equivalent. It occurs with and without activity and question GI.      Not clear to my why she is not on a beta-blocker and will add      Toprol low-dose 25.  2. Unusual to have acid indigestion with the Nexium. [name] having      problems with  constipation with it and I have written for      omeprazole. Will followup.  3. Dyslipidemia. Continue on simvastatin. Will need to follow.   I will set followup for August, sooner if problems develop.     Pricilla Riffle, MD, [facility]  Electronically Signed    PVR/MedQ  DD: 12/11/2006  DT: 12/11/2006  Job #: [id number]

## 2010-12-10 NOTE — Discharge Summary (Signed)
[facility]  Patient:    Krista Wise, Krista Wise                 MRN: [id number] Adm. Date:  [id number] Disc. 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  The remainder of her History and Physical are as previously dictated.  LABORATORY DATA AND X-RAY FINDINGS:  Preoperative hemoglobin 12.1.  Chest x-ray and electrocardiogram within normal limits.  HOSPITAL COURSE:  The patient was taken to the operating room on September 14, 2000, at which time abdominal supracervical hysterectomy with bilateral salpingo-oophorectomy were performed.  The patient did well postoperatively. Diet and ambulation were progressed over the evening of September 14, 2000, and early morning of September 15, 2000.  On the morning of September 15, 2000, she was afebrile and experiencing only slight hesitancy and voiding which later resolved.  It was felt that she could be discharged at this time.  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  PROCEDURE:  Abdominal supracervical hysterectomy with bilateral salpingo-oophorectomy.  Pathology report unavailable at the time of dictation.  DISPOSITION:  Discharged to home to return to the office in approximately two weeks for followup.  SPECIAL INSTRUCTIONS:  [name] to call with unusual bleeding, pain or unexplained fevers.  ACTIVITY:  She was fully ambulatory.  DIET:  Regular.  CONDITION ON DISCHARGE:  Good.  DISCHARGE MEDICATIONS: 1. Tylox or generic, #30, to be taken one or two q.4h. p.r.n. pain. 2. Doxycycline 100 mg, #14, to be taken one b.i.d. 3. Estrogen replacement therapy. DD:  09/18/00 TD:  09/19/00 Job:  [id number]

## 2010-12-10 NOTE — Discharge Summary (Signed)
[name], Wise               ACCOUNT NO.:  [id number]   MEDICAL RECORD NO.:  [id number]          PATIENT TYPE:  INP   LOCATION:  2027                         FACILITY:  [facility]   PHYSICIAN:  Salvadore Farber, MD  DATE OF BIRTH:  May 18, 1946   DATE OF ADMISSION:  10/17/2006  DATE OF DISCHARGE:  10/17/2006                               DISCHARGE SUMMARY   PRIMARY CARDIOLOGIST:  Dr. Dietrich Pates.   PRIMARY CARE PHYSICIAN:  Dr. Mosetta Putt.   PROCEDURES PERFORMED DURING HOSPITALIZATION:  1. Cardiac catheterization per Dr. Randa Evens.      a.     Widely patent circumflex stent.  No new stenosis.  Probable       noncardiac chest pain (please see fully dictated cardiac       catheterization report for more details).   DISCHARGE DIAGNOSES:  1. Noncardiac chest pain.  2. Known coronary artery disease.      a.     Status post admission for unstable angina, treated with       TAXUS drug-eluting stent to the distal circumflex on September 08, 2006.      b.     Cardiac catheterization, September 08, 2006, first diagonal       90% ostial stenosis in a 1.5 mm vessel.  Second diagonal with a       90% ostial stenosis in a 1.0 mm vessel.  Third diagonal with a 50%       ostial stenosis in a 1.5 mm vessel.  Circumflex 30%, mid distal       90%.  Treated with PCI.  Right coronary artery 20% mid, ejection       fraction 65%.  Medical therapy recommended for diagonal disease       secondary to the small size.   SECONDARY DIAGNOSES:  1. Hypertension.  2. Hyperlipidemia.  3. Diabetes mellitus insulin dependent.  4. Treated hypothyroidism.  5. Arthritis.  6. Depression.  7. Chronic fatigue.  8. Rheumatic fever as a child.  9. Nephrolithiasis.   HISTORY OF PRESENT ILLNESS:  This is a 65 year old Caucasian female with  known history of coronary artery disease as discussed above with chronic  chest discomfort, which has been occurring prior to the stent placement  in February and  continued after stent placement.  She describes the  chest discomfort as substernal, coming and going on and off all day.  She takes sublingual nitroglycerin with some relief.  She has occasional  shortness of breath associated with it.  The pain has appeared to be  very chronic, sometimes lasting several hours, at other times lasting  several minutes.  She denies associated nausea, vomiting or syncope.  She does note some bilateral radiation to her shoulders and arms.  On  day of admission, the patient decided to come to the emergency room  because she was tired of it and wished to be further evaluated with  her known history of coronary artery disease secondary to the continuing  symptoms.  The patient was seen and evaluated by Dr. Lorin Picket  Alben Spittle,  physician assistant and Dr. Valera Castle in the emergency room.  The  patient was admitted to rule out cardiac etiology for chest discomfort.  She was placed on heparin.  There was a concern that statins may be  causing some of these myalgias that she is having and had been on  Vytorin in the past, but this had been switched to simvastatin.  The  patient was also continued on aspirin and Plavix and a proton pump  inhibitor was added.   The following morning, the patient was seen and examined by myself and  Dr. Randa Evens and planned cardiac catheterization for that day.  The patient continued to have discomfort prior to the cardiac  catheterization.  The patient tolerated the cardiac catheterization  well, will continue to be treated medically per Dr. Samule Ohm.  Dr. Samule Ohm  was concerned that there may be some component of compression associated  with this discomfort.  She will need to follow with her primary care  physician for other etiology of the continued chest discomfort.   LABS:  Troponin 0.05, 0.03 and 0.02 respectively.  CK-MB 0.9 and 1.1  respectively.  TSH 2.33.  PT 13.6, INR 1.0.  Hemoglobin 11.9, hematocrit  35.5, white blood  cells 4.4, platelets 274.  Sodium 139, potassium 3.8,  chloride 106, CO2 27, BUN 20, creatinine 0.5, glucose 90.  Blood  pressure 135/75, heart rate 76, respirations 20, weight 80.8 kilograms.  EKG revealing normal sinus rhythm.   FOLLOWUP PLANS AND APPOINTMENTS:  1. The patient is scheduled to see Dr. Dietrich Pates on May 15 at 11 a.m.  2. The patient is to continue her previously scheduled medications as      at home.  3. She should follow with her primary care physician and make an      appointment post discharge.  4. The patient has been given postcardiac catheterization instruction      with particular emphasis to the right groin site for evidence of      bleeding, hematoma or infection.   DISCHARGE MEDICATIONS:  1. Aspirin 325 daily.  2. Plavix 75 mg daily.  3. Levoxyl 0.137 daily.  4. Lantus insulin 100 units daily.  5. Lisinopril 40 mg daily.  6. Metformin 1 gram twice a day.  7. Fluoxetine 40 mg daily.  8. Simvastatin 40 mg daily.  9. Requip at bedtime.  10.alprazolam p.r.n.   ALLERGIES:  NO KNOWN DRUG ALLERGIES WITH QUESTIONABLE INTOLERANCE TO  STATINS.   Time spent with the patient, to include physician time, 30 minutes.      Bettey Mare. Lyman Bishop, NP      Salvadore Farber, MD  Electronically Signed    KML/MEDQ  D:  10/18/2006  T:  10/18/2006  Job:  [id number]   cc:   Mosetta Putt, M.D.

## 2010-12-10 NOTE — Discharge Summary (Signed)
NAMELARISSA, Krista Wise               ACCOUNT NO.:  [id number]   MEDICAL RECORD NO.:  [id number]          PATIENT TYPE:  INP   LOCATION:  1435                         FACILITY:  [facility]   PHYSICIAN:  Luis Abed, MD, FACCDATE OF BIRTH:  05/04/1946   DATE OF ADMISSION:  09/06/2006  DATE OF DISCHARGE:  09/08/2006                               DISCHARGE SUMMARY   DISCHARGE PHYSICIAN:  Dr. Myrtis Ser   PRIMARY CARDIOLOGIST:  Dr. Samule Ohm   PRIMARY CARE:  Dr. Mosetta Putt   DISCHARGE DIAGNOSIS:  Coronary artery disease status post cardiac  catheterization this admission by Dr. Samule Ohm on September 08, 2006 with  successful percutaneous revascularization of culprit lesion in the  distal circumflex using a drug-eluting stent, patient with ejection  fraction of 65% without regional wall motion abnormality, residual  disease, 20% stenosis in the mid-coronary artery.  LAD:  First diagonal  with a 90% ostial stenosis, second diagonal 90% ostial stenosis, third  diagonal 50% ostial stenosis.  Circumflex:  Patient with 30% stenosis in  the mid-vessel, distal vessel had 90% stenosis that was treated with the  drug-eluting stent with no residual stenosis.  Patient tolerated  procedure without complications.   PAST MEDICAL HISTORY:  Includes:  1. Type 2 diabetes poorly controlled with a hemoglobin A1c of 11.5      this admission.  2. Hypertension.  3. Chronic fatigue.  4. Arthritis.  5. Depression.   HOSPITAL COURSE:  [name] is a 65 year old Caucasian female with  three-to-four-day history of mid-sternal chest pain radiating to her  neck and shoulders bilaterally.  She initially presented to [facility], was seen by [facility] team.  We were asked to consult.  The  patient was transferred to [facility] for further evaluation.  It was  felt she should have further diagnostic evaluation with her risk factors  and complaints of discomfort.  Patient to the cath lab on the 15th,  results  as stated above.  Patient ruled out for MI with negative cardiac  enzymes, EKG showing no acute ST-T wave changes.  Post cath, patient  stable.  Cardiac rehab worked with patient.  Patient being discharged to  home with instructions to continue previous medications including  Synthroid 137 mcg daily, Lantus insulin 100 units daily or as previously  prescribed by Dr. Duaine Dredge, Wellbutrin 150 b.i.d., lisinopril 40,  Vytorin 10/40, Prozac 40, metformin 500 mg two tablets p.o. b.i.d., the  patient can resume this on September 11, 2006, Xanax as previously  prescribed.  Her new medications include aspirin 325 mg daily and Plavix  75 mg daily, both are indefinite, nitroglycerin for chest discomfort.  Patient has been given the post cardiac catheterization discharge  instructions.  [name] instructed to call the office if she has any  problems from cath site, she is to call our office Monday and make an  appointment to see Dr. Samule Ohm within the next two weeks, patient  verbalizes understanding of the discharge instructions.  [name] to  follow up with Dr. Duaine Dredge within the next month.   Duration of discharge encounter  is 30 minutes.      [name], ACNP      Luis Abed, MD, [facility]  Electronically Signed    MB/MEDQ  D:  09/09/2006  T:  09/10/2006  Job:  [id number]   cc:   Mosetta Putt, M.D.

## 2010-12-10 NOTE — Assessment & Plan Note (Signed)
[facility]                            CARDIOLOGY OFFICE NOTE   NAME:Wise, Krista PRYER                 MRN:          [id number]  DATE:09/21/2006                            DOB:          08/21/1945    PRIMARY CARE PHYSICIAN:  Krista Wise, M.D.   HISTORY OF PRESENT ILLNESS:  Ms. Krista Wise is a 65 year old woman with  hypertension, diabetes mellitus, and hypercholesterolemia.  She  presented with unstable angina on September 07, 2006.  I took her to  cardiac catheterization and found the culprit lesion to be a severe  stenosis of the distal portion of the codominant circumflex artery.  I  treated it with a 2.5 x 20 mm Taxus drug-eluting stent.  The remainder  of her course was uncomplicated.  She has done well since discharge.  She has not had any recurrent angina.  [name] having no exertional  dyspnea, PND, orthopnea, or edema.  She has had a few episodes of chest  discomfort lasting 1 second which are clearly different from her  presenting symptom.   CURRENT MEDICATIONS:  1. Aspirin 325 mg daily.  2. Plavix 75 mg daily.  3. Synthroid 137 mcg daily.  4. Lantus insulin 100 units daily.  5. Wellbutrin 150 mg twice per day.  6. Lisinopril 40 mg daily.  7. Vytorin 10/40, 1 per day.  8. Prozac 40 mg daily.  9. Metformin 1000 mg twice daily.  10.Alprazolam p.r.n.  11.Stool softener p.r.n.   PHYSICAL EXAMINATION:  GENERAL:  [name] generally well appearing, in no  distress.  VITAL SIGNS:  Heart rate 85, blood pressure 130/76, weight 177 pounds.  NECK:  She has no jugular venous distention, thyromegaly, or  lymphadenopathy.  LUNGS:  Clear to auscultation.  CARDIAC:  She has a nondisplaced point of maximal cardiac impulse.  There is a regular rate and rhythm, without murmur, rub, or gallop.  ABDOMEN:  Soft, nondistended, nontender.  There is no  hepatosplenomegaly.  Bowel sounds are normal.  EXTREMITIES:  Warm, without clubbing, cyanosis, edema,  or ulceration.  Carotid pulses 2+ bilaterally, without bruit.   Electrocardiogram demonstrates normal sinus rhythm.  It is a normal EKG.   IMPRESSION/RECOMMENDATION:  1. Coronary disease.  Status post drug-eluting stent in the distal      circumflex for unstable angina.  Doing well.  Since it is drug      eluting, she should continue on aspirin and Plavix indefinitely.  2. Diabetes mellitus.  Hemoglobin A1c was 11.5 in the hospital.  This      is followed by Dr. Duaine Wise.  [name] to see him in approximately 1      month's time.  3. Hypertension.  Blood pressure nicely controlled today.  Continue      current medications.  4. Hypercholesterolemia.  Managed by Dr. Duaine Wise.  Goal LDL less than      70.   The patient will follow up with Dr. Tenny Wise in 3 months' time.     Krista Farber, MD  Electronically Signed    WED/MedQ  DD: 09/21/2006  DT: 09/21/2006  Job #: [id number]  cc:   Krista Wise, M.D.

## 2010-12-10 NOTE — Discharge Summary (Signed)
[place]  Patient:    Krista Wise, Krista Wise Visit Number: [id number] MRN: [id number]          Service Type: MED Location: [id number] Attending Physician:  Drema Halon Dictated by:   Carolyne Fiscal, M.D. Admit Date:  06/16/2001 Discharge Date: 06/18/2001                             Discharge Summary  HISTORY OF PRESENT ILLNESS:  This 65 year old white female patient five days prior to admission developed fever, chills, myalgias, and malaise.  She had been seen in the office a few days prior to admission, and found to have an urinary tract infection and placed on Levaquin.  She was seen in the office on the day of admission by Dr. Karma Ganja, with complaints of weakness, poor intake, and dehydration.  She had a history of type 2 diabetes mellitus. Other significant past medical history included a history of depression.  ADMISSION MEDICATIONS: 1. Glucophage 2 g q.d. 2. Glucotrol 2 tablets q.d. 3. Prozac 60 mg q.d. 4. Levoxyl 125 mcg q.d.  PHYSICAL EXAMINATION:  VITAL SIGNS:  Blood pressure 120/82, pulse 80, respiratory rate 18, temperature 99.1.  HEENT:  Unremarkable, except for dry mucous membranes.  NECK:  Unremarkable.  SKIN:  Normal.  HEART:  Negative.  LUNGS:  Negative.  ABDOMEN:  Without organomegaly or masses.  Bowel sounds were normal.  PELVIC:  Not done, but had been done 36 hours prior to the admission, and was normal.  RECTAL:  Not done, but had been done 36 hours prior to the admission, and was normal.  NEUROLOGIC:  Normal.  ADMISSION DIAGNOSIS:  Possible viral syndrome with dehydration versus pyelonephritis.  LABORATORY DATA:  Admission EKG revealed nonspecific ST-T changes that were minor, and was otherwise normal.  There was no significant change since her previous tracing of 09/08/00.  Abdominal ultrasound showed gallstones, and was otherwise negative.  Chest x-ray demonstrated bronchitic  and mild interstitial changes with a questionable mild left perihilar infiltrate.  Admission CBC showed a hemoglobin of 11.1, hematocrit 34, platelets 329,000, and white blood cell count 5000 with 47 polys, 35 lymphs, 15 monos, 2 eosinophils, and 1 basophil.  CMET on admission was normal except for a potassium of 3.2, glucose of 146, and an albumin of 2.4.  Followup potassium returned to 3.7 prior to discharge.  Glucoses dropped to 111.  Her albumin rose slightly to 2.5.  Liver enzymes were unremarkable.  TSH was normal at 2.63.  Urinalysis on admission showed specific gravity of 1.035.  She had a small amount of occult blood. Many white blood cells were present.  A few red blood cells were present, and many bacteria were present.  Urine culture showed no growth, but the patient was on Levaquin at the time of admission when it was obtained.  HOSPITAL COURSE:  The patient was given intravenous fluids and an 1800 calorie ADA diet.  She was continued on Prozac 60 mg daily, Synthroid 125 mcg daily, Glucophage XR 1 g b.i.d., Glucotrol XL 5 mg daily, and Levaquin 500 mg p.o. daily was continued.  By the second hospital day the patient was improved. She was having some difficulty sleeping, possibly secondary to Tequin which was given at 400 mg q.d. instead of Levaquin.  She had a low-grade fever on the second hospital day.  Vital signs were okay.  Her I&O was good.  Lungs  were clear.  Cardiac examination revealed a grade 2/6 systolic ejection murmur at the left sternal border which was an old finding.  Abdominal examination was negative with no CVA tenderness.  It was felt that she probably had acute pyelonephritis, and not a viral illness.  Her intravenous fluids and Tequin was continued.  By the third hospital day the patient continued to improve further.  She was tolerating solid foods and was afebrile.  She did complain of a small amount of bright red rectal bleeding.  Physical examination  was negative, except she had mild right upper quadrant tenderness with some guarding, but no rebound.  Rectal examination was negative with a trace heme positive stool.  Urine culture obtained as an outpatient prior to admission showed Klebsiella, sensitive to quinolones.  Abdominal ultrasound on the third hospital day showed gallstones.  Consideration was given to the possibility of cholecystitis.  The patient was seen in surgical consultation by Dr. Carolynne Edouard.  It was his feeling the patient did not likely have cholecystitis in the absence of significant abdominal pain and increased liver enzymes, and could be discharged.  DISCHARGE DIAGNOSES: 1. Acute pyelonephritis. 2. Dehydration with hypokalemia. 3. Type 2 diabetes mellitus. 4. Mild anemia, probably secondary to her acute illness. 5. Mild rectal bleeding.  CONDITION ON DISCHARGE:  Satisfactory and improved.  DIET:  A 1500 calorie, ADA.  FOLLOWUP:  In the office in four days.  DISCHARGE MEDICATIONS: 1. Levaquin 500 mg q.d. 2. Glucophage 1 g b.i.d. p.c. 3. Glucotrol XL 10 mg q.a.m. if p.o. intake adequate. 4. Prozac 60 mg q.d. 5. Levoxyl 125 mcg daily. Dictated by:   Carolyne Fiscal, M.D. Attending Physician:  Drema Halon DD:  08/13/01 TD:  08/14/01 Job: [id number]

## 2010-12-10 NOTE — Procedures (Signed)
[place]  Patient:    Krista Wise, Krista Wise Visit Number: [id number] MRN: [id number]          Service Type: END Location: ENDO Attending Physician:  Orland Mustard Dictated by:   Llana Aliment. Randa Evens, M.D. Proc. Date: 08/21/01 Admit Date:  08/21/2001   CC:         Carolyne Fiscal, M.D.   Procedure Report  PROCEDURE PERFORMED:  Colonoscopy.  ENDOSCOPIST:  Llana Aliment. Randa Evens, M.D.  MEDICATIONS USED:  Fentanyl 70 mcg, Versed 6 mg IV.  INSTRUMENT:  INDICATIONS:  Hematochezia with strong family history of colon polyps.  DESCRIPTION OF PROCEDURE:  The procedure had been explained to the patient and consent obtained.  With the patient in the left lateral decubitus position, the Olympus pediatric video colonoscope was inserted and advanced under direct visualization.  The prep was excellent and we were able to advance to the cecum without difficulty.  The ileocecal valve and appendiceal orifice were seen well.  The scope was withdrawn.  The cecum, ascending colon, hepatic flexure, transverse colon, splenic flexure, descending and sigmoid colon were seen well upon withdrawal.  No polyps were seen.  No significant diverticular disease.  Scope withdrawn.  Internal hemorrhoids were seen in the rectum upon removal of the scope.  The patient tolerated the procedure well.  Maintained on low flow oxygen and pulse oximeter throughout the procedure. ASSESSMENT: 1. Internal hemorrhoids probably the source of heme positive stools. 2. Heme positive stool.  PLAN: 1. Will recommend repeating the procedure in five years due to strong family    history of colon polyps in multiple family members. 2. Will give a hemorrhoid sheet. Dictated by:   Llana Aliment. Randa Evens, M.D. Attending Physician:  Orland Mustard DD:  08/21/01 TD:  08/21/01 Job: [id number]

## 2010-12-10 NOTE — H&P (Signed)
[place]. [facility]  Patient:    Krista Wise, Krista Wise Visit Number: [id number] MRN: [id number]          Service Type: Attending:  Vale Haven. Andrey Campanile, M.D. Dictated by:   Vale Haven Andrey Campanile, M.D. Adm. Date:  06/16/01                           History and Physical  PHYSICIAN: Carolyne Fiscal, M.D.  PRESENTING HISTORY: The patient is a 65 year old white female, type 2 diabetic, who five days prior to admission started having fever, chills, and myalgias with decreased energy.  She had no upper respiratory infection symptoms but was seen by Dr. Duaine Dredge on Thursday and found to have an abnormal urine and was placed on Levaquin.  She was seen in my office and admitted today when her liquid intake had been substantially less than it should have been.  She continued to feel weak and it was noted her potassium was low.  She was hospitalized for hydration and appropriate further evaluation and therapy.  ALLERGIES: None known.  MEDICATIONS:  1. Glucophage 2 g total per day.  2. Glucotrol two tablets q.d.  3. Prozac 60 mg q.d.  4. Levoxyl probably 1.25 mg q.d.  SOCIAL HISTORY: No tobacco or alcohol.  [name] married.  FAMILY HISTORY: She has a significant family history of diabetes. PAST MEDICAL/SURGICAL HISTORY:  1. Abdominal hysterectomy earlier this year for bleeding.  2. Left cataract repair.  3. Gravida 2 para 2 AB 0.  REVIEW OF SYSTEMS: Pertinent positives: The patient has been diabetic for three years.  She monitors her own sugars at home.  She today was 200 on a CBG and had not had adequate liquid intake.  She does not particularly follow an appropriate diabetic diet.  Known to have a heart murmur but was told it was not significant by Dr. Caprice Kluver, probably mitral prolapse by history.  GU: The patient has had urinary tract infections in the past and one episode of pyelonephritis.  PHYSICAL EXAMINATION:  VITAL SIGNS: On admission weight 245 pounds.   TEMP 99.1 degrees, pulse 80, respirations 18, blood pressure 120/82.  SKIN: Cool and dry with no acute rash.  No substantial edema.  HEENT: Tympanic membranes clear.  Mucous membranes benign except dry tongue.  NECK: Without masses or bruits.  CARDIORESPIRATORY: I do not today hear a click or murmur.  Lung fields clear.  ABDOMEN: Soft without organomegaly or masses.  Bowel sounds normal.  GU/RECTAL: Deferred pending laboratories and appropriately Dr. Duaine Dredge had examined her 36 hours prior.  NEUROLOGIC/MUSCULOSKELETAL: Within normal limits.  ADMISSION IMPRESSION:  1. Viral syndrome with dehydration.  Rule out pyelonephritis.  2. Type 2 diabetes.  3. Thyroid replacement.  4. History of depression.  PLAN: See orders. Dictated by:   Vale Haven Andrey Campanile, M.D. Attending:  Vale Haven. Andrey Campanile, M.D. DD:  06/16/01 TD:  06/17/01 Job: [id number]

## 2010-12-10 NOTE — H&P (Signed)
Krista Wise, Krista Wise               ACCOUNT NO.:  [id number]   MEDICAL RECORD NO.:  [id number]          PATIENT TYPE:  INP   LOCATION:  2027                         FACILITY:  [facility]   PHYSICIAN:  Jesse Sans. Wall, MD, FACCDATE OF BIRTH:  [birth year]   DATE OF ADMISSION:  10/17/2006  DATE OF DISCHARGE:                              HISTORY & PHYSICAL   PRIMARY CARE PHYSICIAN:  Mosetta Putt, M.D.   CARDIOLOGIST:  Salvadore Farber, MD.  She will eventually switch over  to Dr. Dietrich Pates.   CHIEF COMPLAINT:  Chest pain.   HISTORY OF PRESENT ILLNESS:  Ms. Krista Wise is a very pleasant 65 year old  female patient with coronary artery disease status post Taxus drug-  eluting stent placement to the distal circumflex on September 08, 2006  after presenting with symptoms consistent with unstable angina pectoris.  She was also noted to have a 90% ostial stenosis in the first diagonal  and the second diagonal.  These were both very small vessels, and this  was treated medically.  [name] actually seeing Dr. Samule Ohm back in  followup in the office and was actually doing well at that time.  She  complained of a few episodes of chest discomfort lasting for less than  one second.  The patient called the office today with complaints of  chest discomfort worsened since last Saturday.  Today is Tuesday.  She  was referred to the emergency room as she noted that her pain was  consistent with her pre-PCI pain.  In the emergency room, the patient  notes that she has been having chest discomfort ever since her  intervention.  This was the same pain she had prior to intervention.  It  has never gotten any better.  It has been intermittent.  It is not  necessarily related to exertion.  There are times when she can exert  herself and not have any pain at all.  The pain usually lasts about 30  minutes.  She does take sublingual nitroglycerin with relief.  She does  note shortness of breath.  She also notes  shortness of breath with  exertion, but this seems to be chronic and has not changed that much  recently.  She notes occasional diaphoresis.  She denies nausea or  syncope.  She does note some radiation to her bilateral shoulders and  arms.  [name] currently pain free.   PAST MEDICAL HISTORY:  1. Coronary artery disease.      a.     Status post admission for unstable angina pectoris treated       with a Taxus drug-eluting stent to the distal circumflex September 08, 2006.      b.     Cardiac catheterization September 08, 2006.  First diagonal       90% ostial stenosis in a 1.5-mm vessel; second diagonal with a 90%       ostial stenosis in a 1-mm vessel, third diagonal with 50% ostial       stenosis in a 1.5-mm vessel; circumflex 30%, mid-distal 90%  treated with PCI; RCA 20% mid; EF 65%.  Medical therapy       recommended for diagonal disease secondary to small size.  2. Hypertension.  3. Hyperlipidemia.  4. Diabetes mellitus, insulin dependent.  5. Treated hypothyroidism.  6. Arthritis.  7. Depression.  8. Chronic fatigue.  9. Rheumatic fever as a child.  10.Nephrolithiasis.  11.Status post total abdominal hysterectomy/bilateral salpingo-      oophorectomy.  12.History of left cataract surgery.  13.History of colonoscopy.   MEDICATIONS:  1. Aspirin 325 mg daily.  2. Plavix 75 mg daily.  3. Levoxyl 0.137 mg daily.  4. Lantus 100 units daily.  5. Lisinopril 40 mg daily.  6. Metformin 1 gram b.i.d.  7. Alprazolam p.r.n.  8. Stool softener p.r.n.  9. Fluoxetine 40 mg daily.  10.Nitroglycerin p.r.n.  11.Ibuprofen.  12.Simvastatin 20 mg q.h.s. - recently started and Vytorin was      discontinued secondary to myalgias.  13.Requip q.h.s. - recently started due to suspected restless leg      syndrome.   ALLERGIES:  No known drug allergies.   SOCIAL HISTORY:  She lives in [place] with her husband.  She has  two children.  Denies tobacco or alcohol abuse.  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   FAMILY HISTORY:  Significant for coronary artery disease.  Her father  had myocardial infarction in his 37s and also had bypass.  He is alive  at age 64.   REVIEW OF SYSTEMS:  Please see HPI.  Denies any fevers but has chills.  Denies any headaches or sore throat.  She does have a nonproductive  cough.  There is no hemoptysis.  She denies orthopnea or paroxysmal  nocturnal dyspnea.  Denies any syncope or near syncope.  She does note  global body aches and weakness.  She also notes chronic fatigue.  She  denies bright red blood per rectum or melena, dysphagia, odynophagia.  She does have occasional water brash symptoms.  She denies dysuria or  hematuria.  The rest of the review of systems are negative.  Of note,  she does note a recent trip to [facility].  She traveled in the care  two hours total and in a plane about one hour.   PHYSICAL EXAMINATION:  [name] a well-nourished, well-developed female in  no distress.  Blood pressure 142/73, pulse 78, respirations 18, oxygen saturation 90%  on room air.  HEENT:  Head:  Normocephalic, atraumatic.  Eyes:  PERRLA.  Sclerae  clear.  NECK:  Without JVD.  Carotids without bruits bilaterally.  LYMPHATICS:  Without lymphadenopathy.  ENDOCRINE:  Without thyromegaly.  CARDIAC:  S1, S2, regular rate and rhythm, with a 1 to 2 over 6 systolic  ejection murmur heard best along the left sternal border.  LUNGS:  Clear to auscultation bilaterally without wheezes, rhonchi, or  rales.  SKIN:  Warm and dry.  ABDOMEN:  Soft, nontender with no masses, no rebound, no guarding, no  organomegaly.  EXTREMITIES:  Without clubbing, cyanosis, or edema.  MUSCULOSKELETAL:  Without spine or CVA tenderness.  NEUROLOGIC:  [name] alert and oriented x 3.  Cranial Nerves II-XII  grossly intact.   Chest x-ray is pending.  EKG reveals sinus rhythm with a heart rate of  78, normal axis, no acute changes.  LABORATORY DATA:   Hemoglobin 12.9, sodium 138, potassium 3.8, BUN 20,  creatinine 0.6, glucose 175, cardiac markers negative x1.  D-dimer 1.44.   IMPRESSION:  1. Chest pain syndrome.  The patient's symptoms are somewhat      concerning for end-stage angina pectoris, but she does have some      atypical features as well.  Stent thrombosis is not likely at this      point and time.  She does have a history of 90% ostial stenosis in      the first and second diagonal.  These were small vessels and were      treated medically.  There is a question if her residual disease is      contributing to some of her pain.  She also gives a history of      global body aching.  She recently had her Vytorin switched to      Simvastatin.  Question if she is having myalgias from statin      therapy.  We will hold her statin therapy at this point and time.      Also question whether or not she has fibromyalgia.  Her D-dimer is      elevated.  She did have a recent trip.  Will get a chest CT to rule      out pulmonary embolus.  Will admit her to [facility] and      continue her on aspirin and Plavix.  Will start heparin.  Will      start her on low-dose beta blocker and Imdur therapy.  Will check      serial enzymes.  Will consider proceeding with Myoview scan versus      rule out cardiac catheterization.  Will also add proton pump      inhibitor and check echocardiogram given her murmur.  2. Coronary artery disease status post Taxus stent to the circumflex      February 2008.  Will continue her on aspirin and Plavix.  3. Diabetes mellitus.  Will continue her on Lantus and hold her      metformin for now.  4. Hyperlipidemia.  Will hold her statins as noted above.  5. Hypothyroidism.  Will continue on her Levoxyl.  6. Hypertension.  Will continue on her home medications.  7. Depression.  Will continue on Prozac.      Tereso Newcomer, PA-C      Jesse Sans. Daleen Squibb, MD, [facility]  Electronically Signed    SW/MEDQ  D:   10/17/2006  T:  10/18/2006  Job:  [id number]

## 2010-12-10 NOTE — Cardiovascular Report (Signed)
NAMELORINE, IANNACCONE NO.:  [id number]   MEDICAL RECORD NO.:  [id number]          PATIENT TYPE:  OUT   LOCATION:  CATH                         FACILITY:  [facility]   PHYSICIAN:  Salvadore Farber, MD  DATE OF BIRTH:  [birth year]   DATE OF PROCEDURE:  09/08/2006  DATE OF DISCHARGE:                            CARDIAC CATHETERIZATION   PROCEDURES:  1. Left heart catheterization.  2. Left ventriculography.  3. Coronary angiography.  4. Drug-eluting stent placed in the distal circumflex.  5. Star close closure of the right common femoral arteriotomy site.   INDICATION:  Ms. Tantillo as a 65 year old woman with hypertension,  diabetes, and hypercholesterolemia, but no prior history of cardiac  disease.  She presents with chest discomfort occurring primarily at rest  and not with the modest levels of exertion that she does.  Symptoms have  been progressive over the past couple of weeks prompting presentation to  the emergency room.  Dr. Tenny Craw recommended cardiac catheterization, given  her multiple risk factors.   PROCEDURAL TECHNIQUE:  Informed consent was obtained.  Under 1%  lidocaine local anesthesia, a 5-French sheath was placed in the right  common femoral artery using the modified Seldinger technique.  Diagnostic angiography and ventriculography were performed using JL-4,  JR-4, and pigtail catheters.  These images demonstrated 90% stenosis in  the distal portion of a codominant circumflex.  Decision was made to  proceed to percutaneous revascularization of this.   The sheath was up-sized over a wire to a 6-French.  Anticoagulation was  initiated with bivalirudin.  ACT was confirmed to be greater than 225  seconds.  Plavix 600 mg was administered.  She had been loaded on  aspirin prior to the procedure.   A 6-French Voda left 3.5 guide was advanced over a wire and engaged in  the ostial of the left main.  A Prowater wire was advanced to the distal  portion of  the circumflex without difficulty.  The lesion was pre-  dilated using a 2.25 x 15-mm Maverick at 8 atmospheres.  It was then  stented using a 2.5 x 20-mm TAXUS deployed at 16 atmospheres.  The stent  was then post-dilated using a 2.75 x 20-mm Quantum at 16 atmospheres.  Final angiography demonstrated no residual stenosis, no dissection, and  TIMI III flow to the distal vasculature.   The arteriotomy was closed using a Star close device.  She required  approximately 5 minutes of manual compression to achieve complete  hemostasis.  It was then achieved.  She was transferred to the holding  room in stable condition, having tolerated the procedure well.   COMPLICATIONS:  None.   FINDINGS:  1. LV:  142/2/5.  EF 65% without regional wall motion abnormality.  2. No aortic stenosis or mitral regurgitation.  3. Left main:  Angiographically normal.  4. LAD:  A moderate-sized vessel giving rise to three diagonals.  The      first diagonal was approximately 1.5-mm in diameter and has a 90%      ostial stenosis.  The second diagonal was approximately 1-mm in  diameter and also has a 90% ostial stenosis.  The third diagonal      has a 50% ostial stenosis and is approximately 1.5-mm in diameter.      The LAD has some calcification at the origin of the first diagonal      but no significant stenosis.  5. Circumflex:  Moderate-sized vessel giving rise to two marginals and      a PDA.  It is a codominant vessel.  There is a 30% stenosis of the      mid vessel.  The distal vessel had a 90% stenosis just before the      origin of the left PDA.  This was treated with drug-eluting stent      with no residual stenosis.  6. RCA:  Moderate-sized codominant vessel.  There is a 20% stenosis in      the mid vessel.   IMPRESSION/PLAN:  Successful percutaneous revascularization of the  culprit lesion of the distal circumflex using a drug-eluting stent.  We  will plan medical therapy of the diagonal  stenoses given their small  size.  I think the likelihood of limiting angina due to these is low.   Due to her drug-eluting stent, she should be continued on both aspirin  and Plavix indefinitely.  Importance of control of her cholesterol and  diabetes was emphasized.      Salvadore Farber, MD  Electronically Signed     WED/MEDQ  D:  09/08/2006  T:  09/08/2006  Job:  [id number]   cc:   Mosetta Putt, M.D.  Pricilla Riffle, MD, [facility]

## 2010-12-10 NOTE — Cardiovascular Report (Signed)
Krista Wise, SENEY               ACCOUNT NO.:  [id number]   MEDICAL RECORD NO.:  [id number]          PATIENT TYPE:  INP   LOCATION:  2027                         FACILITY:  [facility]   PHYSICIAN:  Salvadore Farber, MD  DATE OF BIRTH:  [birth year]   DATE OF PROCEDURE:  10/18/2006  DATE OF DISCHARGE:                            CARDIAC CATHETERIZATION   PROCEDURE:  1. Left heart catheterization.  2. Left ventriculography.  3. Coronary angiography.  4. StarClose closure of the right common femoral arteriotomy site.   INDICATIONS:  Ms. Meineke is a 65 year old woman who presented with  unstable angina on September 08, 2006 and underwent placement of a drug-  eluting stent in the distal circumflex.  She presents with recurrent  atypical symptoms.  Though I had seen her, and she was doing fairly well  on February 29, she now presents complaining that she has had chest pain  ever since her initial presentation.  Symptoms can frequently last 20-30  minutes.  She had an episode lasting 2 hours yesterday.  Troponin and  electrocardiogram were normal.  We discussed options for further  assessment; and she opted for coronary angiography for definitive  exclusion of new coronary disease as the etiology of her symptoms.   PROCEDURAL TECHNIQUE:  Informed consent was obtained.  Under 1%  lidocaine local anesthesia, a 5-French sheath was placed in the right  common femoral artery using the modified Seldinger technique.  Diagnostic angiography and ventriculography were performed using JL-4,  JR-4, and pigtail catheters.  The arteriotomy was then closed using a  StarClose device.  Complete hemostasis was obtained.  The patient was  then transferred to the holding room in stable condition, having  tolerated the procedure well.   COMPLICATIONS:  None.   FINDINGS:  1. LV:  133/3/8,  2. EF of 65% without regional wall motion abnormality.  3. No aortic stenosis or mitral regurgitation.  4. Left main:   Angiographically normal.  5. LAD:  Moderate-sized vessel giving rise to three diagonals.  The      first two diagonals are quite small and a third is moderate-sized.      The first two diagonals have a 90% stenosis at their ostia.  The      third diagonal has a 50% stenosis at its origin.  The LAD has some      calcification across from the arch in the first diagonal, but no      significant stenosis.  6. Circumflex:  A moderate-sized vessel giving rise to two marginals      and a PDA.  It is codominant.  There is a 30% stenosis in the mid      vessel.  The previously stented portion of the distal vessel was      widely patent.  7. RCA:  A moderate-sized codominant vessel.  A 20% stenosis in the      mid vessel.   IMPRESSION/PLAN:  No change in her coronary anatomy compared with  February.  Specifically, the stent in the distal circumflex remains  widely patent.  EF remains normal.  I  suspect a noncardiac etiology of  to her chest discomfort.  Discharge home later today.      Salvadore Farber, MD  Electronically Signed     WED/MEDQ  D:  10/18/2006  T:  10/18/2006  Job:  [id number]

## 2010-12-10 NOTE — H&P (Signed)
NAMEANJELIQUE, MAKAR               ACCOUNT NO.:  [id number]   MEDICAL RECORD NO.:  [id number]          PATIENT TYPE:  EMS   LOCATION:  GYN                          FACILITY[facility]  [facility]   PHYSICIAN:  Merlene Laughter. Renae Gloss, M.D.DATE OF BIRTH:  26-Nov-1945   DATE OF ADMISSION:  09/06/2006  DATE OF DISCHARGE:                              HISTORY & PHYSICAL   PRIMARY CARE PHYSICIAN:  Mosetta Putt, M.D.   CHIEF COMPLAINT:  Chest pain.   HISTORY OF PRESENT ILLNESS:  Krista Wise is a 65 year old lady who  presents with a 3-4 day history of mid sternal chest pain that has been  radiating to her neck and shoulders bilaterally.  She complains of  intermittent palpitations and shortness of breath, especially with  exertion.  She denies nausea, vomiting, fever or chills, or diaphoresis.  When in the emergency department, it was noted that she did have relief  of her chest and shoulder pain with sublingual nitroglycerin.   PAST MEDICAL HISTORY:  1. Type 2 diabetes mellitus.  2. Hypertension.  3. Chronic fatigue.  4. Arthritis.  5. Depression.   Family history significant for hypertension, diabetes, and coronary  artery disease in her father who is age 41.  Hypertension in mother, who  is age 56.   SOCIAL HISTORY:  Patient is married.  She denies alcohol, tobacco, or  drugs of abuse.   DRUG ALLERGIES:  NKDA.   MEDICATIONS:  1. Levoxyl 137 mcg 1 p.o. daily.  2. Fluoxetine 40 mg p.o. daily.  3. Metformin 500 mg 2 p.o. b.i.d.  4. Budeprion SR 150 mg 2 p.o. daily.  5. Vytorin 10/40 1 p.o. daily.  6. Lisinopril 400 mg p.o. daily.  7. Lantus 100 units subcu q.a.m.  8. Ibuprofen p.r.n.  9. Alprazolam 0.05 mg p.o. nightly.  10.Tylenol arthritis 650 mg 2 p.o. daily.   REVIEW OF SYSTEMS:  Significant for chronic fatigue, otherwise negative,  greater than 10 systems were reviewed.   PHYSICAL EXAMINATION:  GENERAL APPEARANCE:  A well-developed and well-  nourished female in no acute  distress.  VITAL SIGNS:  Temperature 97.9, pulse 75, respirations 20, blood  pressure 141/79, O2 sats on room air 97%.  HEENT:  No oropharyngeal lesions.  NECK:  Supple.  No masses.  Carotids 2+, no bruits.  LUNGS:  Clear to auscultation bilaterally.  HEART:  S1 and S2.  A grade 3/6 systolic murmur, best heard in the left  upper sternal border.  ABDOMEN:  Soft, nontender, nondistended.  Positive bowel sounds.  EXTREMITIES:  No clubbing, cyanosis or edema.  SKIN:  Warm, intact.  NEURO:  Alert and oriented x3.  Cranial nerves are intact.   LABORATORY DATA:  Chest x-ray shows no acute disease.  Cardiac panel  pending.   ASSESSMENT/PLAN:  1. Chest pain:  Given Ms. Pennella's cardiac risk factors and the fact      that her chest pain has been relieved with nitroglycerin, she will      be admitted to assess her for myocardial ischemia.  Serial cardiac      enzymes will be obtained as well  as cardiology consultation.  2. Hypertension:  Ms. Dempsey outpatient regimen will be continued;      however, adjusted as needed.  3. Type 2 diabetes mellitus:  Hemoglobin A1C will be obtained, and her      diabetic regimen will be adjusted if needed.  4. Hyperlipidemia:  Lipid panel will be obtained, and Vytorin will be      adjusted if needed.           ______________________________  Merlene Laughter Renae Gloss, M.D.     KRS/MEDQ  D:  09/06/2006  T:  09/06/2006  Job:  [id number]

## 2010-12-10 NOTE — Consult Note (Signed)
NAMETYRA, GURAL               ACCOUNT NO.:  [id number]   MEDICAL RECORD NO.:  [id number]          PATIENT TYPE:  INP   LOCATION:  1435                         FACILITY:  [facility]   PHYSICIAN:  Pricilla Riffle, MD, FACCDATE OF BIRTH:  [birth year]   DATE OF CONSULTATION:  09/07/2006  DATE OF DISCHARGE:                                 CONSULTATION   IDENTIFICATION:  The patient is a 65 year old who we are asked to see  regarding chest pain.   HISTORY OF PRESENT ILLNESS:  The patient has no known history of  coronary artery disease.  She complains of substernal chest pain that  began weeks ago, maybe even longer, that has gotten worse over the past  several days.  Now greater than five episodes daily, not associated with  any exertion or meals. Denies any difficulty in eating food.  The food  is not getting stuck. She denies GE reflux.  Of concern to her and what  brought her in was that the pain was radiating to her shoulders and  arms.  5-6 out of 10 at its worse.  No shortness of breath, nausea,  vomiting or diaphoresis.  She was seen by her regular doctor yesterday.  Also, complained of general malaise, has noted increased fatiguability  with exertion, again worsening. [name] not that active, overall.  She  was admitted and ruled out for myocardial infarction.  Note, sublingual  nitroglycerin x2 relieved the symptoms, question Dilaudid.  Notes  occasional PND, remote.   ALLERGIES:  None.   MEDICATIONS:  Levoxyl 137 mcg daily, Wellbutrin 150, 2 daily, Prozac 40  daily, metformin 500 q.i.d., Vytorin 10/40 daily, Xanax q.h.s., Tylenol  650 b.i.d., Lantus insulin 100 q.a.m., lisinopril 40 daily.   Here in the hospital, the patient is getting aspirin 81 mg daily,  guaifenesin b.i.d., Synthroid 137 mcg daily, Prozac 40 daily, Wellbutrin  150 b.i.d., Vytorin 10/40 daily, lisinopril 40 daily, Protonix 40 daily,  heparin IV, Lantus 8 units subcu daily, sliding scale insulin, Phenergan  p.r.n.  Note, hydromorphine p.r.n., does not appear to be getting  metformin, though written for.   PAST MEDICAL HISTORY:  Diabetes.  The patient reports for five years has  neuropathy in hands and feet.  Hypertension.  Dyslipidemia, has been on  Vytorin for four years.  Chronic fatigue, osteoarthritis,  hypothyroidism, depression, history of rheumatic fever as a child,  nephrolithiasis.   PAST SURGICAL HISTORY:  TAH-BSO, left cataract surgery, status post  colonoscopy.   SOCIAL HISTORY:  The patient lives in [place], she is married, does  not smoke, does not drink.   FAMILY HISTORY:  Mother is alive at age 72.  Father is alive at age 72.  He has a history of coronary artery disease with a MI in his 90s, had  bypass surgery. Three sisters, none with CAD.   REVIEW OF SYSTEMS:  Notes 75-80 pound weight loss in the past year.  Occasional headache, depression, arthralgias, chronic abdominal pain.  Otherwise, all systems are reviewed and are negative to the above  problem except as noted.   PHYSICAL EXAMINATION:  GENERAL:  The patient is currently in no acute  distress.  VITAL SIGNS:  Blood pressure 134/76, pulse is 75 and regular, weight not  taken.  Temperature 98, O2 sat on 1 liter 99%.  HEENT:  Normocephalic, atraumatic.  PERRL.  EOMI.  Sclerae clear.  NECK:  JVP is normal.  No bruits.  LUNGS:  Clear to auscultation.  No rales or wheezes.  CARDIAC:  Regular rate and rhythm, S1 and S2.  No S3.  Grade 1/6  systolic murmur.  ABDOMEN:  Supple, nontender.  No hepatomegaly.  EXTREMITIES:  Good distal pulses. No lower extremity edema.  No femoral  bruits.   Chest x-ray shows slight cardiac enlargement. 12-lead EKG shows probable  ectopic atrial rhythm with a PR interval of 134 milliseconds.  Negative  P waves inferiorly. Left axis deviation.  No Q-waves in V6 but normal  QRS in 1 L, question placement.   Labs significant for a hemoglobin of 12.3, WBC of 3.6, BUN and   creatinine of 15 and 0.5, potassium of 3.6. CK-MB negative x3.  Troponin  negative x3.  Cholesterol 122, triglyceride 114, HDL 34, LDL of 65.  Hemoglobin A1c of 11.5.   IMPRESSION:  The patient is a 65 year old with diabetes accompanied by  neuropathy, has poor control, dyslipidemia, family history of CAD,  hypertension, now with increasing chest pain radiating to the shoulders  and notes also increased fatigue, shortness of breath with activity.  The pain occurs with and without activity making it atypical but the  patient is not that active.  I discussed the options of noninvasive  versus invasive (cath) and with all her other medical problems, would  recommend cath to define anatomy.  The patient understands the risks and  agrees to proceed.   PLAN:  Hold metformin, half Landis in a.m., gentle hydration, potassium  in a.m.  Repeat EKG if not done to confirm progression and atrial site.  Continue her on lipid lowering agents.      Pricilla Riffle, MD, [facility]  Electronically Signed     PVR/MEDQ  D:  09/07/2006  T:  09/07/2006  Job:  [id number]

## 2010-12-10 NOTE — Op Note (Signed)
[facility]  Patient:    Krista Wise, Krista Wise                 MRN: [id number] Proc. Date: 09/14/00 Adm. Date:  [id number] Attending:  Collene Schlichter CC:         Harl Bowie, M.D.   Operative Report  PREOPERATIVE DIAGNOSES:  Abnormal uterine bleeding, endometrial hyperplasia with atypia, pelvic pain.  POSTOPERATIVE DIAGNOSES:  Abnormal uterine bleeding, endometrial hyperplasia with atypia, pelvic pain. Pending pathology.  OPERATION PERFORMED:  Abdominal supracervical hysterectomy, bilateral salpingo-oophorectomy.  ANESTHESIA:  General oral tracheal.  SURGEON:  Dr. Randell Patient.  FIRST ASSISTANT:  Dr. Laureen Ochs.  INDICATIONS FOR PROCEDURE:  The patient is a 65 year old with persistent abnormal postmenopausal bleeding and findings of complex hyperplasia with atypia on D&C previously. [name] admitted at this time for above noted surgery and has been counseled fully as to the nature of this procedure and the risks involved to include risks of anesthesia, injury to bowel, bladder, blood vessels, ureters, postoperative hemorrhage, infection, recuperation, and continued hormone replacement following surgery. She fully understands all these considerations and wishes to proceed on September 14, 2000.  OPERATIVE FINDINGS:  On entry into the abdomen, palpation of the upper abdominal viscera revealed the liver, area of the gallbladder, spleen, diaphragms, kidney, periaortic areas to be normal to palpation. The uterus was enlarged and quite soft suggestive of adenomyosis. There were areas of deposits in the serosa which were suggestive of endometriosis as well. Both ovaries appeared to be inactive and normal.  DESCRIPTION OF PROCEDURE:  With the patient under general anesthesia, prepped and draped in the usual sterile fashion, with a Foley catheter in the bladder, a lower abdominal transverse incision was made and carried into the peritoneal cavity without  difficulty except for some difficulty with the patients panniculus. Small bleeders were rendered hemostatic using Bovie electrocoagulation. The self retaining retractor was placed, and the bowel was packed off.  The uterine ovarian anastomosis, tubes, and round ligaments were clamped using Kelly clamps for traction and hemostasis. Suture ligatures were placed in the round ligaments with transection of these structures proximal to the suture and opening of the retroperitoneal space and development of the perivesical space and bladder flap anteriorly. The infundibulopelvic ligaments were identified bilaterally, clamped, cut, and doubly ligated with #1 chromic catgut for removal of the tubes and ovaries. The uterine vessels were then skeletonized bilaterally, clamped, cut and suture ligated with #1 chromic catgut. The cardinal ligaments bilaterally were likewise, clamped, cut and suture ligated with #1 chromic catgut. It was then possible to excise the uterine fundus, tubes and ovaries as a single specimen. The cervix was rendered hemostatic and endocervical tissue was oblated using Bovie electrocoagulation. Figure-of-eight sutures of #1 chromic catgut were placed at the lateral angles of the cervix for hemostasis. The mid portion of the cervix was rendered hemostatic and reapproximated with an interrupted mattress suture of #1 chromic catgut. The area was lavaged with copious amounts of lactated Ringers solution, and small bleeders were rendered hemostatic with Bovie electrocoagulation. There was a slight ooze in the area of the left adnexal structure, so a small piece of Gelfoam was placed with hemostasis. After noting that hemostasis was maintained and that sponge and instrument counts were correct, the peritoneum was closed with a continuing suture of #0 Vicryl. The fascia was closed with two sutures of #0 Vicryl which were brought from the lateral aspects of the incision and tied  separately in the midline. The  subcutaneous fat was reapproximated with interrupted sutures of #1 chromic catgut. The skin was closed with subcuticular suture of 3-0 plain catgut. Estimated blood loss 250 ml. The patient was taken to the recovery room in good condition with clear urine and a Foley catheter tubing. She will be placed on 23 hour observation following surgery. DD:  09/14/00 TD:  09/15/00 Job: [id number]

## 2010-12-10 NOTE — H&P (Signed)
[facility]  Patient:    Krista Wise, Krista Wise                        MRN: [id number] Adm. Date:  09/14/00 Attending:  Almedia Balls. Randell Patient, M.D.                         History and Physical  REASON FOR ADMISSION:  Abnormal uterine bleeding, endometrial hyperplasia with atypia.  HISTORY:  Patient is a 65 year old gravida 2, para 2 who has had intermittent bleeding over the past several years, having been placed on hormone replacement several years ago.  She was seen in our office initially on June 20, 2000, at which time evaluation was performed showing her uterus to be enlarged, with hemoglobin 11.9.  Saline sonogram was performed on June 27, 2000 with finding of polypoid area in the endometrium on saline instillation.  She underwent hysteroscopy/D&C on June 30, 2000, at which time benign endocervical mucosa was found.  Endometrium showed complex hyperplasia with atypia.  The uterus was enlarged and noted to have submucous myomata present.  She was counseled as to the need for surgery and the type of surgery to be performed to include the risks of anesthesia; injury to bowel, bladder, blood vessels, ureters, postoperative hemorrhage or infection; recuperation; and continuation of estrogen therapy.  She fully understands all these considerations and wishes to proceed on September 14, 2000.  Last Pap smear was normal in November of 2001.  PAST MEDICAL HISTORY:  History of rheumatic fever in 1958 which has left her with a heart murmur.  She has adult-onset diabetes mellitus controlled with oral medications to include Glucotrol XL 10 mg two times a day, Glucophage XR 500 mg four times a day.  She has thyroid problems and takes Levoxyl 125 mcg a day.  She has been on Prometrium 100 mg daily, Sonata one 10 mg tablet h.s., Estrace 1 mg daily, fluoxetine 20 mg a day and ibuprofen for pain.  She has been maintained well on her anti-diabetes medications with normal  sugars.  FAMILY HISTORY:  Family history includes grandfather with cancer of unknown type on her maternal side, father and mother with cardiovascular disease, father and aunts with diabetes mellitus.  SOCIAL HISTORY:  The patient is a nonsmoker, nondrinker.  She drinks two to three cups of coffee a day.  REVIEW OF SYSTEMS:  She has headaches.  CARDIORESPIRATORY:  As noted above. GASTROINTESTINAL:  Essentially negative.  GENITOURINARY:  As noted above. NEUROMUSCULAR:  Negative.  PHYSICAL EXAMINATION  VITAL SIGNS:  Height 5 feet 4-1/4 inches.  Weight 257 pounds.  Blood pressure 140/80, pulse 84, respirations 18.  GENERAL:  Well-developed white female in no acute distress.  HEENT:  Within normal limits.  NECK:  Supple without masses, adenopathy or bruits.  HEART:  Regular rate and rhythm with grade 2 to 3 systolic ejection murmur greatest in the aortic area with transmission to the carotids bilaterally.  LUNGS:  Clear to P&A.  BREASTS:  Examined sitting and lying without mass.  Axillae negative.  ABDOMEN:  Increased panniculus.  Soft without mass, nontender.  PELVIC:  External genitalia, Bartholins, urethra and Skenes glands within normal limits.  Cervix slightly inflamed.  Uterus mid-posterior, approximately [redacted] weeks gestational size.  Adnexa without mass.  Rectovaginal exam is confirmatory.  EXTREMITIES:  Within normal limits.  CENTRAL NERVOUS SYSTEM:  Grossly intact.  SKIN:  Without suspicious lesions.  IMPRESSION:  Abnormal  uterine bleeding; complex endometrial hyperplasia; leiomyomata uteri.  DISPOSITION:  As noted above. DD:  09/11/00 TD:  09/12/00 Job: [id number]

## 2010-12-13 ENCOUNTER — Other Ambulatory Visit: Payer: Self-pay | Admitting: Cardiology

## 2010-12-13 ENCOUNTER — Encounter (HOSPITAL_COMMUNITY): Payer: PRIVATE HEALTH INSURANCE | Attending: Cardiology

## 2010-12-13 ENCOUNTER — Telehealth: Payer: Self-pay | Admitting: Cardiology

## 2010-12-13 DIAGNOSIS — I5030 Unspecified diastolic (congestive) heart failure: Secondary | ICD-10-CM | POA: Insufficient documentation

## 2010-12-13 DIAGNOSIS — Z794 Long term (current) use of insulin: Secondary | ICD-10-CM | POA: Insufficient documentation

## 2010-12-13 DIAGNOSIS — I2510 Atherosclerotic heart disease of native coronary artery without angina pectoris: Secondary | ICD-10-CM | POA: Insufficient documentation

## 2010-12-13 DIAGNOSIS — F3289 Other specified depressive episodes: Secondary | ICD-10-CM | POA: Insufficient documentation

## 2010-12-13 DIAGNOSIS — I509 Heart failure, unspecified: Secondary | ICD-10-CM | POA: Insufficient documentation

## 2010-12-13 DIAGNOSIS — Z9861 Coronary angioplasty status: Secondary | ICD-10-CM | POA: Insufficient documentation

## 2010-12-13 DIAGNOSIS — I10 Essential (primary) hypertension: Secondary | ICD-10-CM | POA: Insufficient documentation

## 2010-12-13 DIAGNOSIS — E119 Type 2 diabetes mellitus without complications: Secondary | ICD-10-CM | POA: Insufficient documentation

## 2010-12-13 DIAGNOSIS — E669 Obesity, unspecified: Secondary | ICD-10-CM | POA: Insufficient documentation

## 2010-12-13 DIAGNOSIS — I359 Nonrheumatic aortic valve disorder, unspecified: Secondary | ICD-10-CM | POA: Insufficient documentation

## 2010-12-13 DIAGNOSIS — F329 Major depressive disorder, single episode, unspecified: Secondary | ICD-10-CM | POA: Insufficient documentation

## 2010-12-13 DIAGNOSIS — I2582 Chronic total occlusion of coronary artery: Secondary | ICD-10-CM | POA: Insufficient documentation

## 2010-12-13 DIAGNOSIS — Z5189 Encounter for other specified aftercare: Secondary | ICD-10-CM | POA: Insufficient documentation

## 2010-12-13 LAB — GLUCOSE, CAPILLARY: Glucose-Capillary: 241 mg/dL — ABNORMAL HIGH (ref 70–99)

## 2010-12-13 NOTE — Telephone Encounter (Signed)
LOv,12 faxed to [facility] @ [phone]  12/13/10/km

## 2010-12-14 ENCOUNTER — Encounter: Payer: Self-pay | Admitting: Cardiology

## 2010-12-14 ENCOUNTER — Ambulatory Visit (INDEPENDENT_AMBULATORY_CARE_PROVIDER_SITE_OTHER): Payer: PRIVATE HEALTH INSURANCE | Admitting: Cardiology

## 2010-12-14 DIAGNOSIS — I509 Heart failure, unspecified: Secondary | ICD-10-CM

## 2010-12-14 DIAGNOSIS — I2510 Atherosclerotic heart disease of native coronary artery without angina pectoris: Secondary | ICD-10-CM

## 2010-12-14 DIAGNOSIS — I10 Essential (primary) hypertension: Secondary | ICD-10-CM

## 2010-12-14 DIAGNOSIS — E785 Hyperlipidemia, unspecified: Secondary | ICD-10-CM

## 2010-12-14 NOTE — Patient Instructions (Signed)
Follow-up with Dr Shirlee Latch as needed.

## 2010-12-14 NOTE — Assessment & Plan Note (Signed)
S/p PCI with DES x 2 to CFX.  She will continue Plavix for at least a year and likely long-term given multiple drug eluting stents.  Continue ASA 81, statin, Coreg, and lisinopril. She has started cardiac rehab.

## 2010-12-14 NOTE — Assessment & Plan Note (Signed)
Lipids/LFTs in 7/12 with goal LDL < 70.

## 2010-12-14 NOTE — Assessment & Plan Note (Signed)
BP is at goal on current regimen. 

## 2010-12-14 NOTE — Assessment & Plan Note (Signed)
Diastolic CHF with preserved LV systolic function but moderate pulmonary hypertension.  Weight is down 5 lbs and BNP is down.  She still has NYHA class III symptoms.  I will continue current med regimen for now. [name] going to start cardiac rehab which should help with deconditioning, which I think is a major part of her dyspnea. Followup in 3 months.

## 2010-12-14 NOTE — Progress Notes (Signed)
PCP: [name]  65 yo with history of CAD, DM, HTN, and diastolic CHF presents for followup of dyspnea, orthopnea, and lower extremity edema. She had unstable angina in 2/08 with a stent placed in her distal CFX. I saw her recently in the office due to severe exertional dyspnea (short of breath just walking around her house). She was volume overloaded on exam and hypertensive. I uptitrated her BP meds and started Lasix. She was set up for myoview and echo. The myoview was ischemic by both ECG and perfusion images. There was significant lateral ischemia. Echo showed preserved EF (55-60%), but there was moderate pulmonary hypertension suggesting likely diastolic LV dysfunction.  I took to the cath lab where left heart cath showed 80-90% mid and distal circumflex stenoses before and after the prior-placed stent and a totally occluded moderate-sized OM1.  She received 2 drug eluting stents in the CFX.   Since discharge from the hospital, she has continued to have dyspnea.  [name] short of breath walking around her house.  At last appointment, I increased her Lasix and had her stop Actos.  Weight is down 5 lbs.  Her lower extremity edema has mostly resolved and BNP has gone down considerably but she is symptomatically about the same. No chest pain.  [name] not very active, her husband is taking care of most of the cooking and cleaning.  She started cardiac rehab yesterday.   Labs (3/12): BNP 417 => 309, K 4.4, creatinine 0.7, HCT 44, free T3/T4 normal, TSH normal, HDL 38, LDL 96.5  Labs (5/12): BNP 422 => 160, K 3.9, creatinine 0.9, HDL 33, LDL 84  Allergies (verified):  No Known Drug Allergies   Family History:  Father: Deceased; MI in his 104s, CABG, CHF  Mother: Alive; HTN, rhematoid arthritis, anemic   Social History:  Married, lives with husband in [place]  disabled: diabetes & fibromyagia  Tobacco Use - No.  Alcohol Use - no  Regular Exercise - no   Past Medical History:  1.  Hypertension  2. Depression  3. DM  4. Obesity  5. GERD  6. CAD: Unstable angina 2/08. PCI to distal codominant CFX with 2.5 x 20 Taxus DES. Lexiscan myoview (3/12): chest pain and dyspnea with stress, EF 62%, small fixed lateral perfusion defect with significant surrounding reversible perfusion defect suggestive of significant ischemia, ischemic ECG response.  LHC (4/12): 80-90% mid and distal CFX stenosis before and after prior-placed stent, total occlusion of a moderate 1st OM.  Patient had DES x 2 to CFX.   7. Fibromyalgia  8. Diastolic CHF: Echo (3/12) with EF 55-60%, mild LV hypertrophy, mild aortic stenosis (mean gradient 13 mmHg), mild MR, normal RV size and systolic function, PA systolic pressure 55 mmHg.  9. Aortic stenosis: Mild by echo 3/12, mean gradient 13 mmHg.    Current Outpatient Prescriptions  Medication Sig Dispense Refill  . amLODipine (NORVASC) 5 MG tablet Take 5 mg by mouth daily.        Marland Kitchen aspirin EC 81 MG EC tablet Take 1 tablet (81 mg total) by mouth daily.      Marland Kitchen atorvastatin (LIPITOR) 80 MG tablet Take 40 mg by mouth 2 (two) times daily.        . carvedilol (COREG) 12.5 MG tablet Take 1 tablet (12.5 mg total) by mouth 2 (two) times daily.  60 tablet  6  . clopidogrel (PLAVIX) 75 MG tablet Take 1 tablet (75 mg total) by mouth daily.      Marland Kitchen  DULoxetine (CYMBALTA) 60 MG capsule Take 60 mg by mouth daily.        . furosemide (LASIX) 40 MG tablet Take 1 tablet (40 mg total) by mouth daily.  30 tablet  11  . glipiZIDE (GLUCOTROL) 5 MG tablet Take 1 tablet (5 mg total) by mouth 2 (two) times daily before a meal.  60 tablet  6  . ibuprofen (ADVIL,MOTRIN) 800 MG tablet Take 800 mg by mouth as needed.        . insulin glargine (LANTUS) 100 UNIT/ML injection Inject 90 Units into the skin as directed.       . insulin lispro (HUMALOG) 100 UNIT/ML injection Inject 30 Units into the skin as directed.       Marland Kitchen lisinopril (PRINIVIL,ZESTRIL) 40 MG tablet Take 40 mg by mouth daily.         . metFORMIN (GLUCOPHAGE) 1000 MG tablet Take 1,000 mg by mouth 2 (two) times daily.        . potassium chloride SA (KLOR-CON M20) 20 MEQ tablet Take 1 tablet (20 mEq total) by mouth daily.  30 tablet  6  . pregabalin (LYRICA) 75 MG capsule Take 75 mg by mouth 2 (two) times daily.        Marland Kitchen rOPINIRole (REQUIP) 2 MG tablet Take 2 mg by mouth daily.        Marland Kitchen zolpidem (AMBIEN) 10 MG tablet Take 10 mg by mouth at bedtime as needed.        Marland Kitchen DISCONTD: atorvastatin (LIPITOR) 80 MG tablet Take 1 tablet (80 mg total) by mouth daily.  30 tablet  3  . DISCONTD: simvastatin (ZOCOR) 40 MG tablet Take 40 mg by mouth at bedtime.          BP 118/55  Pulse 74  Resp 20  Ht 5\' 5"  (1.651 m)  Wt 263 lb (119.296 kg)  BMI 43.77 kg/m2 General: Well developed, well nourished, in no acute distress. Obese.  Neck: Neck supple, JVP 7 cm. No masses, thyromegaly or abnormal cervical nodes.  Lungs: Clear bilaterally  Heart: Non-displaced PMI, chest non-tender; regular rate and rhythm, S1, S2 without rubs or gallops. 2/6 early systolic murmur RUSB. Carotid upstroke normal, no bruit. Pedals normal pulses. Trace ankle edema.  Abdomen: Bowel sounds positive; abdomen soft and non-tender without masses, organomegaly, or hernias noted. No hepatosplenomegaly.  Extremities: No clubbing or cyanosis.  Neurologic: Alert and oriented x 3.  Psych: Normal affect.

## 2010-12-15 ENCOUNTER — Other Ambulatory Visit: Payer: Self-pay | Admitting: Cardiology

## 2010-12-15 ENCOUNTER — Encounter (HOSPITAL_COMMUNITY): Payer: PRIVATE HEALTH INSURANCE

## 2010-12-15 LAB — GLUCOSE, CAPILLARY
Glucose-Capillary: 250 mg/dL — ABNORMAL HIGH (ref 70–99)
Glucose-Capillary: 187 mg/dL — ABNORMAL HIGH (ref 70–99)

## 2010-12-17 ENCOUNTER — Other Ambulatory Visit: Payer: Self-pay | Admitting: Cardiology

## 2010-12-17 ENCOUNTER — Encounter (HOSPITAL_COMMUNITY): Payer: PRIVATE HEALTH INSURANCE

## 2010-12-17 LAB — GLUCOSE, CAPILLARY: Glucose-Capillary: 279 mg/dL — ABNORMAL HIGH (ref 70–99)

## 2010-12-20 ENCOUNTER — Encounter (HOSPITAL_COMMUNITY): Payer: PRIVATE HEALTH INSURANCE

## 2010-12-22 ENCOUNTER — Encounter (HOSPITAL_COMMUNITY): Payer: PRIVATE HEALTH INSURANCE

## 2010-12-22 ENCOUNTER — Other Ambulatory Visit: Payer: Self-pay | Admitting: Cardiology

## 2010-12-22 LAB — GLUCOSE, CAPILLARY: Glucose-Capillary: 138 mg/dL — ABNORMAL HIGH (ref 70–99)

## 2010-12-24 ENCOUNTER — Encounter (HOSPITAL_COMMUNITY): Payer: PRIVATE HEALTH INSURANCE | Attending: Cardiology

## 2010-12-24 ENCOUNTER — Other Ambulatory Visit: Payer: Self-pay | Admitting: Cardiology

## 2010-12-24 DIAGNOSIS — E669 Obesity, unspecified: Secondary | ICD-10-CM | POA: Insufficient documentation

## 2010-12-24 DIAGNOSIS — I2510 Atherosclerotic heart disease of native coronary artery without angina pectoris: Secondary | ICD-10-CM | POA: Insufficient documentation

## 2010-12-24 DIAGNOSIS — Z5189 Encounter for other specified aftercare: Secondary | ICD-10-CM | POA: Insufficient documentation

## 2010-12-24 DIAGNOSIS — I2582 Chronic total occlusion of coronary artery: Secondary | ICD-10-CM | POA: Insufficient documentation

## 2010-12-24 DIAGNOSIS — F329 Major depressive disorder, single episode, unspecified: Secondary | ICD-10-CM | POA: Insufficient documentation

## 2010-12-24 DIAGNOSIS — Z794 Long term (current) use of insulin: Secondary | ICD-10-CM | POA: Insufficient documentation

## 2010-12-24 DIAGNOSIS — F3289 Other specified depressive episodes: Secondary | ICD-10-CM | POA: Insufficient documentation

## 2010-12-24 DIAGNOSIS — I10 Essential (primary) hypertension: Secondary | ICD-10-CM | POA: Insufficient documentation

## 2010-12-24 DIAGNOSIS — E119 Type 2 diabetes mellitus without complications: Secondary | ICD-10-CM | POA: Insufficient documentation

## 2010-12-24 DIAGNOSIS — I5030 Unspecified diastolic (congestive) heart failure: Secondary | ICD-10-CM | POA: Insufficient documentation

## 2010-12-24 DIAGNOSIS — Z9861 Coronary angioplasty status: Secondary | ICD-10-CM | POA: Insufficient documentation

## 2010-12-24 DIAGNOSIS — I359 Nonrheumatic aortic valve disorder, unspecified: Secondary | ICD-10-CM | POA: Insufficient documentation

## 2010-12-24 DIAGNOSIS — I509 Heart failure, unspecified: Secondary | ICD-10-CM | POA: Insufficient documentation

## 2010-12-24 LAB — GLUCOSE, CAPILLARY: Glucose-Capillary: 223 mg/dL — ABNORMAL HIGH (ref 70–99)

## 2010-12-27 ENCOUNTER — Encounter (HOSPITAL_COMMUNITY): Payer: PRIVATE HEALTH INSURANCE

## 2010-12-27 ENCOUNTER — Encounter: Payer: Self-pay | Admitting: Cardiology

## 2010-12-29 ENCOUNTER — Encounter (HOSPITAL_COMMUNITY): Payer: PRIVATE HEALTH INSURANCE

## 2010-12-31 ENCOUNTER — Encounter (HOSPITAL_COMMUNITY): Payer: PRIVATE HEALTH INSURANCE

## 2011-01-03 ENCOUNTER — Encounter (HOSPITAL_COMMUNITY): Payer: PRIVATE HEALTH INSURANCE

## 2011-01-03 ENCOUNTER — Other Ambulatory Visit: Payer: PRIVATE HEALTH INSURANCE | Admitting: *Deleted

## 2011-01-05 ENCOUNTER — Encounter (HOSPITAL_COMMUNITY): Payer: PRIVATE HEALTH INSURANCE

## 2011-01-07 ENCOUNTER — Encounter (HOSPITAL_COMMUNITY): Payer: PRIVATE HEALTH INSURANCE

## 2011-01-10 ENCOUNTER — Encounter (HOSPITAL_COMMUNITY): Payer: PRIVATE HEALTH INSURANCE

## 2011-01-12 ENCOUNTER — Encounter (HOSPITAL_COMMUNITY): Payer: PRIVATE HEALTH INSURANCE

## 2011-01-14 ENCOUNTER — Encounter (HOSPITAL_COMMUNITY): Payer: PRIVATE HEALTH INSURANCE

## 2011-01-17 ENCOUNTER — Encounter (HOSPITAL_COMMUNITY): Payer: PRIVATE HEALTH INSURANCE

## 2011-01-17 ENCOUNTER — Other Ambulatory Visit: Payer: Self-pay | Admitting: Internal Medicine

## 2011-01-17 ENCOUNTER — Ambulatory Visit
Admission: RE | Admit: 2011-01-17 | Discharge: 2011-01-17 | Disposition: A | Discharge status: Home / Self Care | Payer: PRIVATE HEALTH INSURANCE | Source: Ambulatory Visit | Attending: Internal Medicine | Admitting: Internal Medicine

## 2011-01-17 DIAGNOSIS — R0602 Shortness of breath: Secondary | ICD-10-CM

## 2011-01-19 ENCOUNTER — Encounter (HOSPITAL_COMMUNITY): Payer: PRIVATE HEALTH INSURANCE

## 2011-01-21 ENCOUNTER — Other Ambulatory Visit (INDEPENDENT_AMBULATORY_CARE_PROVIDER_SITE_OTHER): Payer: PRIVATE HEALTH INSURANCE | Admitting: *Deleted

## 2011-01-21 ENCOUNTER — Encounter (HOSPITAL_COMMUNITY): Payer: PRIVATE HEALTH INSURANCE

## 2011-01-21 DIAGNOSIS — I10 Essential (primary) hypertension: Secondary | ICD-10-CM

## 2011-01-21 LAB — HEPATIC FUNCTION PANEL
ALT: 18 U/L (ref 0–35)
AST: 20 U/L (ref 0–37)
Albumin: 3.5 g/dL (ref 3.5–5.2)
Alkaline Phosphatase: 72 U/L (ref 39–117)
Total Protein: 6.4 g/dL (ref 6.0–8.3)

## 2011-01-24 ENCOUNTER — Encounter (HOSPITAL_COMMUNITY): Payer: PRIVATE HEALTH INSURANCE

## 2011-01-25 ENCOUNTER — Encounter: Payer: Self-pay | Admitting: Cardiology

## 2011-01-25 ENCOUNTER — Emergency Department (HOSPITAL_BASED_OUTPATIENT_CLINIC_OR_DEPARTMENT_OTHER)
Admission: EM | Admit: 2011-01-25 | Discharge: 2011-01-26 | Disposition: A | Discharge status: Home / Self Care | Payer: PRIVATE HEALTH INSURANCE | Attending: Emergency Medicine | Admitting: Emergency Medicine

## 2011-01-25 ENCOUNTER — Emergency Department (INDEPENDENT_AMBULATORY_CARE_PROVIDER_SITE_OTHER): Payer: PRIVATE HEALTH INSURANCE

## 2011-01-25 DIAGNOSIS — E109 Type 1 diabetes mellitus without complications: Secondary | ICD-10-CM | POA: Insufficient documentation

## 2011-01-25 DIAGNOSIS — S199XXA Unspecified injury of neck, initial encounter: Secondary | ICD-10-CM

## 2011-01-25 DIAGNOSIS — Y92009 Unspecified place in unspecified non-institutional (private) residence as the place of occurrence of the external cause: Secondary | ICD-10-CM | POA: Insufficient documentation

## 2011-01-25 DIAGNOSIS — W1809XA Striking against other object with subsequent fall, initial encounter: Secondary | ICD-10-CM | POA: Insufficient documentation

## 2011-01-25 DIAGNOSIS — S0990XA Unspecified injury of head, initial encounter: Secondary | ICD-10-CM | POA: Insufficient documentation

## 2011-01-25 DIAGNOSIS — E785 Hyperlipidemia, unspecified: Secondary | ICD-10-CM | POA: Insufficient documentation

## 2011-01-25 DIAGNOSIS — I2510 Atherosclerotic heart disease of native coronary artery without angina pectoris: Secondary | ICD-10-CM | POA: Insufficient documentation

## 2011-01-25 DIAGNOSIS — IMO0002 Reserved for concepts with insufficient information to code with codable children: Secondary | ICD-10-CM

## 2011-01-25 DIAGNOSIS — R0602 Shortness of breath: Secondary | ICD-10-CM

## 2011-01-25 DIAGNOSIS — I10 Essential (primary) hypertension: Secondary | ICD-10-CM | POA: Insufficient documentation

## 2011-01-25 LAB — CK TOTAL AND CKMB (NOT AT ARMC)
CK, MB: 1.9 ng/mL (ref 0.3–4.0)
Relative Index: INVALID (ref 0.0–2.5)
Total CK: 54 U/L (ref 7–177)

## 2011-01-25 LAB — BASIC METABOLIC PANEL
BUN: 17 mg/dL (ref 6–23)
Calcium: 9.6 mg/dL (ref 8.4–10.5)
Chloride: 98 mEq/L (ref 96–112)
Creatinine, Ser: 1.1 mg/dL (ref 0.50–1.10)
GFR calc Af Amer: >60 mL/min (ref >60)
GFR calc non Af Amer: 50 mL/min — ABNORMAL LOW (ref >60)

## 2011-01-25 LAB — TROPONIN I: Troponin I: <0.3 ng/mL (ref <0.30)

## 2011-01-25 LAB — CBC
MCV: 81.3 fL (ref 78.0–100.0)
Platelets: 227 10*3/uL (ref 150–400)
RBC: 4.38 MIL/uL (ref 3.87–5.11)
RDW: 17.3 % — ABNORMAL HIGH (ref 11.5–15.5)
WBC: 7.3 10*3/uL (ref 4.0–10.5)

## 2011-01-25 LAB — DIFFERENTIAL
Basophils Absolute: 0 10*3/uL (ref 0.0–0.1)
Basophils Relative: 0 % (ref 0–1)
Eosinophils Absolute: 0.2 10*3/uL (ref 0.0–0.7)
Eosinophils Relative: 3 % (ref 0–5)
Lymphs Abs: 1.1 10*3/uL (ref 0.7–4.0)
Neutrophils Relative %: 73 % (ref 43–77)

## 2011-01-26 ENCOUNTER — Encounter (HOSPITAL_COMMUNITY): Payer: PRIVATE HEALTH INSURANCE

## 2011-01-27 ENCOUNTER — Ambulatory Visit (INDEPENDENT_AMBULATORY_CARE_PROVIDER_SITE_OTHER): Payer: PRIVATE HEALTH INSURANCE | Admitting: Cardiology

## 2011-01-27 ENCOUNTER — Encounter: Payer: Self-pay | Admitting: *Deleted

## 2011-01-27 DIAGNOSIS — I509 Heart failure, unspecified: Secondary | ICD-10-CM

## 2011-01-27 DIAGNOSIS — I2510 Atherosclerotic heart disease of native coronary artery without angina pectoris: Secondary | ICD-10-CM

## 2011-01-27 DIAGNOSIS — E785 Hyperlipidemia, unspecified: Secondary | ICD-10-CM

## 2011-01-27 DIAGNOSIS — I10 Essential (primary) hypertension: Secondary | ICD-10-CM

## 2011-01-27 DIAGNOSIS — R42 Dizziness and giddiness: Secondary | ICD-10-CM

## 2011-01-27 DIAGNOSIS — I5032 Chronic diastolic (congestive) heart failure: Secondary | ICD-10-CM

## 2011-01-27 MED ORDER — FUROSEMIDE 40 MG PO TABS: ORAL_TABLET | ORAL | Status: DC

## 2011-01-27 MED ORDER — POTASSIUM CHLORIDE CRYS ER 20 MEQ PO TBCR: EXTENDED_RELEASE_TABLET | ORAL | Status: DC

## 2011-01-27 MED ORDER — MECLIZINE HCL 12.5 MG PO TABS: 12.5000 mg | ORAL_TABLET | Freq: Three times a day (TID) | ORAL | Status: DC | PRN

## 2011-01-27 NOTE — Patient Instructions (Signed)
Your physician has recommended you make the following change in your medication:    INCREASE:  Furosemide to 1 tablet in the morning and 1 tablet in the evening for 4 days      Then:   Furosemide 1 tablet in the morning and 1/2 tablet in the evening  INCREASE:  Potassium to 1 tablet in the morning and 1 tablet in the evening for 4 days       Then:     Potassium 1 tablet in the morning and 1/2 tablet in the evening  START:  Meclizine   You have been referred to EENT doctor for your vertigo  Your physician recommends that you schedule a follow-up appointment in: 2 weeks with Dr. Shirlee Latch  Your physician recommends that you return for lab work in: 2 weeks for a bmp and a bnp

## 2011-01-28 ENCOUNTER — Encounter: Payer: Self-pay | Admitting: Cardiology

## 2011-01-28 ENCOUNTER — Encounter (HOSPITAL_COMMUNITY): Payer: PRIVATE HEALTH INSURANCE

## 2011-01-28 DIAGNOSIS — R42 Dizziness and giddiness: Secondary | ICD-10-CM | POA: Insufficient documentation

## 2011-01-28 NOTE — Progress Notes (Signed)
   Patient ID: Krista Wise, female    DOB: 12-09-1945, 65 y.o.   MRN: [id number]  HPI    Review of Systems    Physical Exam

## 2011-01-28 NOTE — Progress Notes (Signed)
PCP: [name]  65 yo with history of CAD, DM, HTN, and diastolic CHF presents for followup of dyspnea, orthopnea, and lower extremity edema. She had unstable angina in 2/08 with a stent placed in her distal CFX. I saw her earlier this year due to severe exertional dyspnea (short of breath just walking around her house). She was volume overloaded on exam and hypertensive. I uptitrated her BP meds and started Lasix. She was set up for myoview and echo. The myoview was ischemic by both ECG and perfusion images. There was significant lateral ischemia. Echo showed preserved EF (55-60%), but there was moderate pulmonary hypertension suggesting likely diastolic LV dysfunction.  I took her to the cath lab in 4/12 where left heart cath showed 80-90% mid and distal circumflex stenoses before and after the prior-placed stent and a totally occluded moderate-sized OM1.  She received 2 drug eluting stents in the CFX.   Since discharge from the hospital, she has continued to have dyspnea.  [name] short of breath after walking about 20 feet (this has changed minimally).  She has rare atypical chest pain (nonexertional, lasts 1-2 seconds at a time).  Today, she also reports "dizziness."  She feels off balance and has been walking with a walker.  She has had 2 recent falls due to losing balance (did not pass out).  She reports a "spinning" sensation that will often occur when she moves her head from side to side or lies down.  She does not get lightheaded like she is going to pass out, and she was not orthostatic when checked in the office today.  She has had vertigo before that felt similar to this, and it sounds like meclizine helped in the past.    Patient started cardiac rehab but apparently developed some retinal bleeding related to her diabetes retinopathy and has been treated by an ophthalmologist. She stopped cardiac rehab when this began.  Labs (3/12): BNP 417 => 309, K 4.4, creatinine 0.7, HCT 44, free T3/T4  normal, TSH normal, HDL 38, LDL 96.5  Labs (5/12): BNP 422 => 160, K 3.9, creatinine 0.9, HDL 33, LDL 84 Labs (6/12): LDL 50, HDL 34, TGs 197 Labs (7/12): K 4.1, creatinine 1.1  Allergies (verified):  No Known Drug Allergies   Family History:  Father: Deceased; MI in his 40s, CABG, CHF  Mother: Alive; HTN, rhematoid arthritis, anemic   Social History:  Married, lives with husband in [place]  disabled: diabetes & fibromyagia  Tobacco Use - No.  Alcohol Use - no  Regular Exercise - no   Past Medical History:  1. Hypertension  2. Depression  3. DM  4. Obesity  5. GERD  6. CAD: Unstable angina 2/08. PCI to distal codominant CFX with 2.5 x 20 Taxus DES. Lexiscan myoview (3/12): chest pain and dyspnea with stress, EF 62%, small fixed lateral perfusion defect with significant surrounding reversible perfusion defect suggestive of significant ischemia, ischemic ECG response.  LHC (4/12): 80-90% mid and distal CFX stenosis before and after prior-placed stent, total occlusion of a moderate 1st OM.  Patient had DES x 2 to CFX.   7. Fibromyalgia  8. Diastolic CHF: Echo (3/12) with EF 55-60%, mild LV hypertrophy, mild aortic stenosis (mean gradient 13 mmHg), mild MR, normal RV size and systolic function, PA systolic pressure 55 mmHg.  9. Aortic stenosis: Mild by echo 3/12, mean gradient 13 mmHg.  10.  Diabetic retinopathy 11.  H/o vertigo   Current Outpatient Prescriptions  Medication Sig  Dispense Refill  . albuterol (PROAIR HFA) 108 (90 BASE) MCG/ACT inhaler Inhale 2 puffs into the lungs every 6 (six) hours as needed.        Marland Kitchen amLODipine (NORVASC) 5 MG tablet Take 5 mg by mouth daily.        Marland Kitchen aspirin EC 81 MG EC tablet Take 1 tablet (81 mg total) by mouth daily.      Marland Kitchen atorvastatin (LIPITOR) 40 MG tablet Take 40 mg by mouth daily.       Marland Kitchen buPROPion (WELLBUTRIN SR) 150 MG 12 hr tablet Take 150 mg by mouth 2 (two) times daily.        . carvedilol (COREG) 12.5 MG tablet Take 1 tablet  (12.5 mg total) by mouth 2 (two) times daily.  60 tablet  6  . clopidogrel (PLAVIX) 75 MG tablet Take 1 tablet (75 mg total) by mouth daily.      . DULoxetine (CYMBALTA) 60 MG capsule Take 60 mg by mouth daily.        . furosemide (LASIX) 40 MG tablet 1 tablet in the morning and 1/2 tablet in the evening   60 tablet  11  . glipiZIDE (GLUCOTROL) 5 MG tablet Take 1 tablet (5 mg total) by mouth 2 (two) times daily before a meal.  60 tablet  6  . ibuprofen (ADVIL,MOTRIN) 800 MG tablet Take 800 mg by mouth as needed.        . insulin glargine (LANTUS) 100 UNIT/ML injection Inject into the skin as directed.       . insulin lispro (HUMALOG) 100 UNIT/ML injection Inject into the skin as directed.       Marland Kitchen lisinopril (PRINIVIL,ZESTRIL) 20 MG tablet 2 tabs po qd       . metFORMIN (GLUCOPHAGE) 1000 MG tablet Take 1,000 mg by mouth 2 (two) times daily.        . potassium chloride SA (KLOR-CON M20) 20 MEQ tablet 1 tablet in the morning and 1/2 tablet in the evening   60 tablet  11  . pregabalin (LYRICA) 75 MG capsule Take 75 mg by mouth 2 (two) times daily.        Marland Kitchen rOPINIRole (REQUIP) 2 MG tablet Take 2 mg by mouth daily.        . traZODone (DESYREL) 50 MG tablet Take 50 mg by mouth at bedtime.        Marland Kitchen zolpidem (AMBIEN) 10 MG tablet Take 10 mg by mouth at bedtime as needed.        . meclizine (ANTIVERT) 12.5 MG tablet Take 1 tablet (12.5 mg total) by mouth 3 (three) times daily as needed.  30 tablet  3    BP 124/68  Pulse 70  Ht 5\' 4"  (1.626 m)  Wt 269 lb (122.018 kg)  BMI 46.17 kg/m2 General: Well developed, well nourished, in no acute distress. Obese.  Neck: Neck supple, JVP 8-9 cm. No masses, thyromegaly or abnormal cervical nodes.  Lungs: Clear bilaterally  Heart: Non-displaced PMI, chest non-tender; regular rate and rhythm, S1, S2 without rubs or gallops. 2/6 early systolic murmur RUSB. Carotid upstroke normal, no bruit. Pedals normal pulses. 1+ ankle edema.  Abdomen: Bowel sounds positive;  abdomen soft and non-tender without masses, organomegaly, or hernias noted. No hepatosplenomegaly.  Extremities: No clubbing or cyanosis.  Neurologic: Alert and oriented x 3.  Psych: Normal affect.

## 2011-01-28 NOTE — Assessment & Plan Note (Signed)
LDL is at goal, continue current atorvastatin.

## 2011-01-28 NOTE — Assessment & Plan Note (Signed)
BP is at goal, continue current medications.  

## 2011-01-28 NOTE — Assessment & Plan Note (Signed)
Chronic diastolic CHF.  Patient has stable NYHA class III symptoms and does appear volume overloaded.  Weight is up 6 lbs.  I will have her increase Lasix to 40 mg bid x 4 days then 40 qam, 20 qpm.  She will increase KCl to 20 bid x 4 days then 20 qam, 10 qpm.  I will get a BMET/BNP in 10 days and she will followup in 2 wks.

## 2011-01-28 NOTE — Assessment & Plan Note (Signed)
Patient is "dizzy" with poor balance.  [name] not orthostatic.  Symptoms sound more like positional vertigo which she apparently has had in the past.  I will let her try meclizine and will have her followup with ENT for evaluation and possible Epley maneuvers.

## 2011-01-28 NOTE — Assessment & Plan Note (Addendum)
S/p PCI with DES x 2 to CFX.  [name] not having worrisome chest pain.  She continues to have exertional dyspnea but this has not changed appreciably.  She will continue Plavix for at least a year and likely long-term given multiple drug eluting stents.  Continue ASA 81, statin, Coreg, and lisinopril.  I would like her to restart cardiac rehab.

## 2011-01-31 ENCOUNTER — Encounter (HOSPITAL_COMMUNITY): Payer: PRIVATE HEALTH INSURANCE

## 2011-02-01 ENCOUNTER — Telehealth: Payer: Self-pay | Admitting: *Deleted

## 2011-02-01 NOTE — Telephone Encounter (Signed)
02/01/11 Krista Wise had an appointment with Dr.Gore on 02/02/11 at 1:40p, and her son told me that she was felling a lot better and he tried to cancel the appointment,but no one answer the phone. I call this morning and cancel it for him just to save him a cancellation fee. [name]

## 2011-02-02 ENCOUNTER — Encounter (HOSPITAL_COMMUNITY): Payer: PRIVATE HEALTH INSURANCE

## 2011-02-04 ENCOUNTER — Encounter (HOSPITAL_COMMUNITY): Payer: PRIVATE HEALTH INSURANCE

## 2011-02-07 ENCOUNTER — Encounter (HOSPITAL_COMMUNITY): Payer: PRIVATE HEALTH INSURANCE

## 2011-02-09 ENCOUNTER — Encounter (HOSPITAL_COMMUNITY): Payer: PRIVATE HEALTH INSURANCE

## 2011-02-09 ENCOUNTER — Other Ambulatory Visit (INDEPENDENT_AMBULATORY_CARE_PROVIDER_SITE_OTHER): Payer: PRIVATE HEALTH INSURANCE | Admitting: *Deleted

## 2011-02-09 ENCOUNTER — Encounter: Payer: Self-pay | Admitting: Cardiology

## 2011-02-09 DIAGNOSIS — I2510 Atherosclerotic heart disease of native coronary artery without angina pectoris: Secondary | ICD-10-CM

## 2011-02-10 ENCOUNTER — Encounter: Payer: Self-pay | Admitting: Cardiology

## 2011-02-10 ENCOUNTER — Ambulatory Visit (INDEPENDENT_AMBULATORY_CARE_PROVIDER_SITE_OTHER): Payer: PRIVATE HEALTH INSURANCE | Admitting: Cardiology

## 2011-02-10 DIAGNOSIS — I509 Heart failure, unspecified: Secondary | ICD-10-CM

## 2011-02-10 DIAGNOSIS — R2681 Unsteadiness on feet: Secondary | ICD-10-CM

## 2011-02-10 DIAGNOSIS — I2510 Atherosclerotic heart disease of native coronary artery without angina pectoris: Secondary | ICD-10-CM

## 2011-02-10 DIAGNOSIS — E785 Hyperlipidemia, unspecified: Secondary | ICD-10-CM

## 2011-02-10 DIAGNOSIS — R269 Unspecified abnormalities of gait and mobility: Secondary | ICD-10-CM

## 2011-02-10 DIAGNOSIS — R0602 Shortness of breath: Secondary | ICD-10-CM

## 2011-02-10 DIAGNOSIS — I5032 Chronic diastolic (congestive) heart failure: Secondary | ICD-10-CM

## 2011-02-10 LAB — BASIC METABOLIC PANEL
CO2: 27 mEq/L (ref 19–32)
Calcium: 9 mg/dL (ref 8.4–10.5)
Calcium: 9.1 mg/dL (ref 8.4–10.5)
Creatinine, Ser: 1 mg/dL (ref 0.4–1.2)
GFR: 59.5 mL/min — ABNORMAL LOW (ref >60.00)
GFR: 53.87 mL/min — ABNORMAL LOW (ref >60.00)
Glucose, Bld: 199 mg/dL — ABNORMAL HIGH (ref 70–99)
Potassium: 4.5 mEq/L (ref 3.5–5.1)
Sodium: 141 mEq/L (ref 135–145)
Sodium: 142 mEq/L (ref 135–145)

## 2011-02-10 LAB — BRAIN NATRIURETIC PEPTIDE: Pro B Natriuretic peptide (BNP): 175 pg/mL — ABNORMAL HIGH (ref 0.0–100.0)

## 2011-02-10 NOTE — Patient Instructions (Signed)
Lab today---BMP/BNP 414.01  428.32  Cardiac Rehab phone number [phone].  Schedule an appointment to see Dr Shirlee Latch in 2 months.

## 2011-02-11 ENCOUNTER — Encounter (HOSPITAL_COMMUNITY): Payer: PRIVATE HEALTH INSURANCE

## 2011-02-11 DIAGNOSIS — R2681 Unsteadiness on feet: Secondary | ICD-10-CM | POA: Insufficient documentation

## 2011-02-11 NOTE — Assessment & Plan Note (Signed)
LDL is at goal, continue current atorvastatin.  

## 2011-02-11 NOTE — Progress Notes (Signed)
PCP: [name]  65 yo with history of CAD, DM, HTN, and diastolic CHF presents for followup of dyspnea, orthopnea, and lower extremity edema. She had unstable angina in 2/08 with a stent placed in her distal CFX. I saw her earlier this year due to severe exertional dyspnea (short of breath just walking around her house). She was volume overloaded on exam and hypertensive. I uptitrated her BP meds and started Lasix. She was set up for myoview and echo. The myoview was ischemic by both ECG and perfusion images. There was significant lateral ischemia. Echo showed preserved EF (55-60%), but there was moderate pulmonary hypertension suggesting likely diastolic LV dysfunction.  I took her to the cath lab in 4/12 where left heart cath showed 80-90% mid and distal circumflex stenoses before and after the prior-placed stent and a totally occluded moderate-sized OM1.  She received 2 drug eluting stents in the CFX.   At last appointment, patient was doing poorly symptomatically with significant exertional dyspnea as well as gait instability and episodes of positional vertigo.  I gave her meclizine for vertigo and increased her Lasix.  Since last appointment, she has been feeling considerably better.  The vertigo has resolved.  She has lost 13 lbs and is breathing better.  [name] able to walk around in her house without dyspnea.  [name] still short of breath walking longer distances.  She continues to walk with a walker because of balance difficulty: she attributes this to her peripheral neuropathy.  She has had an occasional cough.  No chest pain.   Labs (3/12): BNP 417 => 309, K 4.4, creatinine 0.7, HCT 44, free T3/T4 normal, TSH normal, HDL 38, LDL 96.5  Labs (5/12): BNP 422 => 160, K 3.9, creatinine 0.9, HDL 33, LDL 84 Labs (6/12): LDL 50, HDL 34, TGs 197 Labs (7/12): K 4.1, creatinine 1.1  Allergies (verified):  No Known Drug Allergies   Family History:  Father: Deceased; MI in his 33s, CABG, CHF    Mother: Alive; HTN, rhematoid arthritis, anemic   Social History:  Married, lives with husband in [place]  disabled: diabetes & fibromyagia  Tobacco Use - No.  Alcohol Use - no  Regular Exercise - no   Past Medical History:  1. Hypertension  2. Depression  3. DM  4. Obesity  5. GERD  6. CAD: Unstable angina 2/08. PCI to distal codominant CFX with 2.5 x 20 Taxus DES. Lexiscan myoview (3/12): chest pain and dyspnea with stress, EF 62%, small fixed lateral perfusion defect with significant surrounding reversible perfusion defect suggestive of significant ischemia, ischemic ECG response.  LHC (4/12): 80-90% mid and distal CFX stenosis before and after prior-placed stent, total occlusion of a moderate 1st OM.  Patient had DES x 2 to CFX.   7. Fibromyalgia  8. Diastolic CHF: Echo (3/12) with EF 55-60%, mild LV hypertrophy, mild aortic stenosis (mean gradient 13 mmHg), mild MR, normal RV size and systolic function, PA systolic pressure 55 mmHg.  9. Aortic stenosis: Mild by echo 3/12, mean gradient 13 mmHg.  10.  Diabetic retinopathy 11.  H/o vertigo 12.  Peripheral neuropathy likely from diabetes.    Current Outpatient Prescriptions  Medication Sig Dispense Refill  . albuterol (PROAIR HFA) 108 (90 BASE) MCG/ACT inhaler Inhale 2 puffs into the lungs every 6 (six) hours as needed.        Marland Kitchen amLODipine (NORVASC) 5 MG tablet Take 5 mg by mouth daily.        Marland Kitchen  aspirin EC 81 MG EC tablet Take 1 tablet (81 mg total) by mouth daily.      Marland Kitchen atorvastatin (LIPITOR) 40 MG tablet Take 40 mg by mouth daily.       Marland Kitchen buPROPion (WELLBUTRIN SR) 150 MG 12 hr tablet Take 150 mg by mouth 2 (two) times daily.        . carvedilol (COREG) 12.5 MG tablet Take 1 tablet (12.5 mg total) by mouth 2 (two) times daily.  60 tablet  6  . clopidogrel (PLAVIX) 75 MG tablet Take 1 tablet (75 mg total) by mouth daily.      . DULoxetine (CYMBALTA) 60 MG capsule Take 60 mg by mouth daily.        . furosemide (LASIX) 40 MG  tablet 1 tablet in the morning and 1/2 tablet in the evening   60 tablet  11  . glipiZIDE (GLUCOTROL) 5 MG tablet Take 1 tablet (5 mg total) by mouth 2 (two) times daily before a meal.  60 tablet  6  . ibuprofen (ADVIL,MOTRIN) 800 MG tablet Take 800 mg by mouth as needed.        . insulin glargine (LANTUS) 100 UNIT/ML injection Inject into the skin as directed.       . insulin lispro (HUMALOG) 100 UNIT/ML injection Inject into the skin as directed.       Marland Kitchen lisinopril (PRINIVIL,ZESTRIL) 20 MG tablet 2 tabs po qd       . metFORMIN (GLUCOPHAGE) 1000 MG tablet Take 1,000 mg by mouth 2 (two) times daily.        . potassium chloride SA (KLOR-CON M20) 20 MEQ tablet 1 tablet in the morning and 1/2 tablet in the evening   60 tablet  11  . pregabalin (LYRICA) 75 MG capsule Take 75 mg by mouth 2 (two) times daily.        Marland Kitchen rOPINIRole (REQUIP) 2 MG tablet Take 2 mg by mouth daily.        . traZODone (DESYREL) 50 MG tablet Take 50 mg by mouth at bedtime.        Marland Kitchen zolpidem (AMBIEN) 10 MG tablet Take 10 mg by mouth at bedtime as needed.          BP 118/56  Pulse 74  Resp 14  Ht 5\' 2"  (1.575 m)  Wt 256 lb (116.121 kg)  BMI 46.82 kg/m2 General: Well developed, well nourished, in no acute distress. Obese.  Neck: Neck supple, JVP 7 cm. No masses, thyromegaly or abnormal cervical nodes.  Lungs: Clear bilaterally  Heart: Non-displaced PMI, chest non-tender; regular rate and rhythm, S1, S2 without rubs or gallops. 2/6 early systolic murmur RUSB. Carotid upstroke normal, no bruit. Pedals normal pulses. No edema.  Abdomen: Bowel sounds positive; abdomen soft and non-tender without masses, organomegaly, or hernias noted. No hepatosplenomegaly.  Extremities: No clubbing or cyanosis.  Neurologic: Alert and oriented x 3.  Psych: Normal affect.

## 2011-02-11 NOTE — Assessment & Plan Note (Signed)
She uses a walker when out of the house.  She has tingling and numbness in her feet.  I think that her instability may be primarily due to diabetic neuropathy. She has been seeing an endocrinologist and need good glucose control.

## 2011-02-11 NOTE — Assessment & Plan Note (Signed)
S/p PCI with DES x 2 to CFX.  [name] not having worrisome chest pain.  She continues to have exertional dyspnea but this is now improved.  She will continue Plavix for at least a year and likely long-term given multiple drug eluting stents.  Continue ASA 81, statin, Coreg, and lisinopril.  I would like her to restart cardiac rehab.

## 2011-02-11 NOTE — Assessment & Plan Note (Signed)
Chronic diastolic CHF.  Patient has NYHA class III symptoms but is improved compared to prior appointment and is down 13 lbs.  I will have her continue current Lasix dosing and will get BMET/BNP today.  She needs to increase her activity level and will go back to cardiac rehab.

## 2011-02-14 ENCOUNTER — Encounter (HOSPITAL_COMMUNITY): Payer: PRIVATE HEALTH INSURANCE

## 2011-02-16 ENCOUNTER — Encounter (HOSPITAL_COMMUNITY): Payer: PRIVATE HEALTH INSURANCE

## 2011-02-18 ENCOUNTER — Encounter (HOSPITAL_COMMUNITY): Payer: PRIVATE HEALTH INSURANCE

## 2011-02-21 ENCOUNTER — Encounter (HOSPITAL_COMMUNITY): Payer: PRIVATE HEALTH INSURANCE

## 2011-02-23 ENCOUNTER — Encounter (HOSPITAL_COMMUNITY): Payer: PRIVATE HEALTH INSURANCE

## 2011-02-25 ENCOUNTER — Encounter (HOSPITAL_COMMUNITY): Payer: PRIVATE HEALTH INSURANCE

## 2011-02-28 ENCOUNTER — Ambulatory Visit: Payer: PRIVATE HEALTH INSURANCE | Attending: Nurse Practitioner | Admitting: Physical Therapy

## 2011-02-28 ENCOUNTER — Encounter (HOSPITAL_COMMUNITY): Payer: PRIVATE HEALTH INSURANCE

## 2011-02-28 DIAGNOSIS — IMO0001 Reserved for inherently not codable concepts without codable children: Secondary | ICD-10-CM | POA: Insufficient documentation

## 2011-02-28 DIAGNOSIS — R269 Unspecified abnormalities of gait and mobility: Secondary | ICD-10-CM | POA: Insufficient documentation

## 2011-02-28 DIAGNOSIS — R5381 Other malaise: Secondary | ICD-10-CM | POA: Insufficient documentation

## 2011-03-02 ENCOUNTER — Ambulatory Visit: Payer: PRIVATE HEALTH INSURANCE | Admitting: Physical Therapy

## 2011-03-02 ENCOUNTER — Encounter (HOSPITAL_COMMUNITY): Payer: PRIVATE HEALTH INSURANCE

## 2011-03-04 ENCOUNTER — Encounter (HOSPITAL_COMMUNITY): Payer: PRIVATE HEALTH INSURANCE

## 2011-03-04 ENCOUNTER — Other Ambulatory Visit: Payer: Self-pay | Admitting: Cardiology

## 2011-03-07 ENCOUNTER — Encounter (HOSPITAL_COMMUNITY): Payer: PRIVATE HEALTH INSURANCE

## 2011-03-08 ENCOUNTER — Other Ambulatory Visit: Payer: Self-pay | Admitting: Orthopaedic Surgery

## 2011-03-08 DIAGNOSIS — M4310 Spondylolisthesis, site unspecified: Secondary | ICD-10-CM

## 2011-03-08 DIAGNOSIS — R29898 Other symptoms and signs involving the musculoskeletal system: Secondary | ICD-10-CM

## 2011-03-08 DIAGNOSIS — M545 Low back pain: Secondary | ICD-10-CM

## 2011-03-09 ENCOUNTER — Encounter (HOSPITAL_COMMUNITY): Payer: PRIVATE HEALTH INSURANCE

## 2011-03-10 ENCOUNTER — Encounter: Payer: PRIVATE HEALTH INSURANCE | Admitting: Physical Therapy

## 2011-03-11 ENCOUNTER — Encounter (HOSPITAL_COMMUNITY): Payer: PRIVATE HEALTH INSURANCE

## 2011-03-14 ENCOUNTER — Encounter (HOSPITAL_COMMUNITY): Payer: PRIVATE HEALTH INSURANCE

## 2011-03-16 ENCOUNTER — Encounter (HOSPITAL_COMMUNITY): Payer: PRIVATE HEALTH INSURANCE

## 2011-03-16 ENCOUNTER — Ambulatory Visit
Admission: RE | Admit: 2011-03-16 | Discharge: 2011-03-16 | Disposition: A | Discharge status: Home / Self Care | Payer: PRIVATE HEALTH INSURANCE | Source: Ambulatory Visit | Attending: Orthopaedic Surgery | Admitting: Orthopaedic Surgery

## 2011-03-16 DIAGNOSIS — R29898 Other symptoms and signs involving the musculoskeletal system: Secondary | ICD-10-CM

## 2011-03-16 DIAGNOSIS — M4310 Spondylolisthesis, site unspecified: Secondary | ICD-10-CM

## 2011-03-16 DIAGNOSIS — M545 Low back pain, unspecified: Secondary | ICD-10-CM

## 2011-03-18 ENCOUNTER — Encounter (HOSPITAL_COMMUNITY): Payer: PRIVATE HEALTH INSURANCE

## 2011-03-18 ENCOUNTER — Encounter: Payer: PRIVATE HEALTH INSURANCE | Admitting: Physical Therapy

## 2011-03-21 ENCOUNTER — Ambulatory Visit (HOSPITAL_COMMUNITY): Payer: PRIVATE HEALTH INSURANCE

## 2011-03-22 ENCOUNTER — Encounter: Payer: PRIVATE HEALTH INSURANCE | Admitting: Physical Therapy

## 2011-03-23 ENCOUNTER — Ambulatory Visit (HOSPITAL_COMMUNITY): Payer: PRIVATE HEALTH INSURANCE

## 2011-03-24 ENCOUNTER — Encounter: Payer: PRIVATE HEALTH INSURANCE | Admitting: Physical Therapy

## 2011-03-25 ENCOUNTER — Ambulatory Visit (HOSPITAL_COMMUNITY): Payer: PRIVATE HEALTH INSURANCE

## 2011-03-28 ENCOUNTER — Ambulatory Visit (HOSPITAL_COMMUNITY): Payer: PRIVATE HEALTH INSURANCE

## 2011-03-29 ENCOUNTER — Encounter: Payer: PRIVATE HEALTH INSURANCE | Admitting: Physical Therapy

## 2011-03-30 ENCOUNTER — Ambulatory Visit (HOSPITAL_COMMUNITY): Payer: PRIVATE HEALTH INSURANCE

## 2011-03-31 ENCOUNTER — Encounter: Payer: PRIVATE HEALTH INSURANCE | Admitting: Physical Therapy

## 2011-04-01 ENCOUNTER — Ambulatory Visit (HOSPITAL_COMMUNITY): Payer: PRIVATE HEALTH INSURANCE

## 2011-04-04 ENCOUNTER — Ambulatory Visit (HOSPITAL_COMMUNITY): Payer: PRIVATE HEALTH INSURANCE

## 2011-04-05 ENCOUNTER — Other Ambulatory Visit: Payer: Self-pay | Admitting: Nurse Practitioner

## 2011-04-05 ENCOUNTER — Other Ambulatory Visit: Payer: PRIVATE HEALTH INSURANCE

## 2011-04-05 ENCOUNTER — Inpatient Hospital Stay
Admission: RE | Admit: 2011-04-05 | Discharge: 2011-04-05 | Payer: PRIVATE HEALTH INSURANCE | Source: Ambulatory Visit | Attending: Nurse Practitioner | Admitting: Nurse Practitioner

## 2011-04-05 DIAGNOSIS — N6310 Unspecified lump in the right breast, unspecified quadrant: Secondary | ICD-10-CM

## 2011-04-06 ENCOUNTER — Ambulatory Visit
Admission: RE | Admit: 2011-04-06 | Discharge: 2011-04-06 | Disposition: A | Discharge status: Home / Self Care | Payer: PRIVATE HEALTH INSURANCE | Source: Ambulatory Visit | Attending: Nurse Practitioner | Admitting: Nurse Practitioner

## 2011-04-06 ENCOUNTER — Other Ambulatory Visit: Payer: Self-pay | Admitting: Nurse Practitioner

## 2011-04-06 ENCOUNTER — Ambulatory Visit (HOSPITAL_COMMUNITY): Payer: PRIVATE HEALTH INSURANCE

## 2011-04-06 DIAGNOSIS — N6310 Unspecified lump in the right breast, unspecified quadrant: Secondary | ICD-10-CM

## 2011-04-07 ENCOUNTER — Other Ambulatory Visit: Payer: PRIVATE HEALTH INSURANCE

## 2011-04-08 ENCOUNTER — Ambulatory Visit (HOSPITAL_COMMUNITY): Payer: PRIVATE HEALTH INSURANCE

## 2011-04-11 ENCOUNTER — Ambulatory Visit (HOSPITAL_COMMUNITY): Payer: PRIVATE HEALTH INSURANCE

## 2011-04-12 ENCOUNTER — Other Ambulatory Visit: Payer: Self-pay | Admitting: Orthopaedic Surgery

## 2011-04-12 DIAGNOSIS — M549 Dorsalgia, unspecified: Secondary | ICD-10-CM

## 2011-04-12 DIAGNOSIS — M479 Spondylosis, unspecified: Secondary | ICD-10-CM

## 2011-04-13 ENCOUNTER — Ambulatory Visit (HOSPITAL_COMMUNITY): Payer: PRIVATE HEALTH INSURANCE

## 2011-04-15 ENCOUNTER — Ambulatory Visit (HOSPITAL_COMMUNITY): Payer: PRIVATE HEALTH INSURANCE

## 2011-04-18 ENCOUNTER — Ambulatory Visit
Admission: RE | Admit: 2011-04-18 | Discharge: 2011-04-18 | Disposition: A | Discharge status: Home / Self Care | Payer: PRIVATE HEALTH INSURANCE | Source: Ambulatory Visit | Attending: Orthopaedic Surgery | Admitting: Orthopaedic Surgery

## 2011-04-18 DIAGNOSIS — M479 Spondylosis, unspecified: Secondary | ICD-10-CM

## 2011-04-18 DIAGNOSIS — M549 Dorsalgia, unspecified: Secondary | ICD-10-CM

## 2011-04-18 MED ORDER — DIAZEPAM 2 MG PO TABS
10.0000 mg | ORAL_TABLET | Freq: Once | ORAL | Status: AC
  Administered 2011-04-18: 10 mg via ORAL

## 2011-04-18 MED ORDER — IOHEXOL 180 MG/ML  SOLN
20.0000 mL | Freq: Once | INTRAMUSCULAR | Status: AC | PRN
  Administered 2011-04-18: 20 mL via INTRATHECAL

## 2011-04-18 MED ORDER — SODIUM CHLORIDE 0.9 % IV SOLN: 4.0000 mg | Freq: Four times a day (QID) | INTRAVENOUS | Status: DC | PRN

## 2011-04-18 NOTE — Progress Notes (Signed)
Pt states she has been off Trazadone for the past two days.  jkl

## 2011-04-20 ENCOUNTER — Ambulatory Visit (INDEPENDENT_AMBULATORY_CARE_PROVIDER_SITE_OTHER)
Admission: RE | Admit: 2011-04-20 | Discharge: 2011-04-20 | Disposition: A | Discharge status: Home / Self Care | Payer: PRIVATE HEALTH INSURANCE | Source: Ambulatory Visit | Attending: Cardiology | Admitting: Cardiology

## 2011-04-20 ENCOUNTER — Encounter: Payer: Self-pay | Admitting: Cardiology

## 2011-04-20 ENCOUNTER — Ambulatory Visit (INDEPENDENT_AMBULATORY_CARE_PROVIDER_SITE_OTHER): Payer: PRIVATE HEALTH INSURANCE | Admitting: Cardiology

## 2011-04-20 ENCOUNTER — Ambulatory Visit (HOSPITAL_BASED_OUTPATIENT_CLINIC_OR_DEPARTMENT_OTHER): Payer: PRIVATE HEALTH INSURANCE | Attending: Cardiology

## 2011-04-20 DIAGNOSIS — I5032 Chronic diastolic (congestive) heart failure: Secondary | ICD-10-CM

## 2011-04-20 DIAGNOSIS — R0989 Other specified symptoms and signs involving the circulatory and respiratory systems: Secondary | ICD-10-CM

## 2011-04-20 DIAGNOSIS — I509 Heart failure, unspecified: Secondary | ICD-10-CM

## 2011-04-20 DIAGNOSIS — R0683 Snoring: Secondary | ICD-10-CM

## 2011-04-20 DIAGNOSIS — E785 Hyperlipidemia, unspecified: Secondary | ICD-10-CM

## 2011-04-20 DIAGNOSIS — I2510 Atherosclerotic heart disease of native coronary artery without angina pectoris: Secondary | ICD-10-CM

## 2011-04-20 DIAGNOSIS — R0602 Shortness of breath: Secondary | ICD-10-CM

## 2011-04-20 DIAGNOSIS — G4733 Obstructive sleep apnea (adult) (pediatric): Secondary | ICD-10-CM | POA: Insufficient documentation

## 2011-04-20 LAB — CBC
HCT: 42.3
Hemoglobin: 13.8
MCHC: 32.6
MCV: 76.4 — ABNORMAL LOW
RBC: 5.53 — ABNORMAL HIGH

## 2011-04-20 LAB — URINE CULTURE: Colony Count: >=100000

## 2011-04-20 LAB — POCT I-STAT, CHEM 8
Calcium, Ion: 1.13
Chloride: 99
Glucose, Bld: 394 — ABNORMAL HIGH
HCT: 45
TCO2: 28

## 2011-04-20 LAB — DIFFERENTIAL
Basophils Relative: 0
Eosinophils Absolute: 0
Eosinophils Relative: 0
Monocytes Absolute: 0.5
Monocytes Relative: 7
Neutro Abs: 5.9

## 2011-04-20 LAB — URINALYSIS, ROUTINE W REFLEX MICROSCOPIC
Bilirubin Urine: NEGATIVE
Ketones, ur: >80 — AB
Nitrite: POSITIVE — AB
Protein, ur: 100 — AB
pH: 7.5

## 2011-04-20 LAB — URINE MICROSCOPIC-ADD ON

## 2011-04-20 MED ORDER — POTASSIUM CHLORIDE CRYS ER 20 MEQ PO TBCR: 20.0000 meq | EXTENDED_RELEASE_TABLET | Freq: Two times a day (BID) | ORAL | Status: DC

## 2011-04-20 MED ORDER — FUROSEMIDE 40 MG PO TABS: ORAL_TABLET | ORAL | Status: DC

## 2011-04-20 NOTE — Assessment & Plan Note (Signed)
LDL is at goal, continue current atorvastatin.

## 2011-04-20 NOTE — Assessment & Plan Note (Signed)
S/p PCI with DES x 2 to CFX.  [name] not having worrisome chest pain.  She continues to have exertional dyspnea.  She will continue Plavix for at least a year and likely long-term given multiple drug eluting stents.  Continue ASA 81, statin, Coreg, and lisinopril.  She has been evaluated by Dr. Ophelia Charter for lumbar spine surgery.  It will not be safe for her to come off Plavix until 5/13 for surgery, and she should hold off unless it is emergent.

## 2011-04-20 NOTE — Progress Notes (Signed)
PCP: Krista Wise  65 yo with history of CAD, DM, HTN, and diastolic CHF presents for followup of dyspnea, orthopnea, and lower extremity edema. She had unstable angina in 2/08 with a stent placed in her distal CFX. I saw her earlier this year due to severe exertional dyspnea (short of breath just walking around her house). She was volume overloaded on exam and hypertensive. I uptitrated her BP meds and started Lasix. She was set up for myoview and echo. The myoview was ischemic by both ECG and perfusion images. There was significant lateral ischemia. Echo showed preserved EF (55-60%), but there was moderate pulmonary hypertension suggesting likely diastolic LV dysfunction.  I took her to the cath lab in 4/12 where left heart cath showed 80-90% mid and distal circumflex stenoses before and after the prior-placed stent and a totally occluded moderate-sized OM1.  She received 2 drug eluting stents in the CFX.   At last appointment, Krista Wise seemed to be doing better.  Lately, however, her dyspnea has increased.  [name] very limited by bilateral knee and back pain is not able to get much exercise.  She has been unsteady on her feet and feels like her legs are weak.  She uses a walker.  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  An operation was recommended.  Currently, she is short of breath just walking around her house.  She does have orthopnea and sleeps on several pillows.  She was told by her physical therapist that her oxygen level has been running low.  She feels fatigued all the time, and per her husband, she snores loudly.  Since last appointment, she has gained 10 lbs.    Oxygen saturation today was 85% at rest and 82% with exertion.  Saturation rose to 96% with 2 liters oxygen by nasal cannula.   Labs (3/12): BNP 417 => 309, K 4.4, creatinine 0.7, HCT 44, free T3/T4 normal, TSH normal, HDL 38, LDL 96.5  Labs (5/12): BNP 422 => 160, K 3.9, creatinine 0.9,  HDL 33, LDL 84 Labs (6/12): LDL 50, HDL 34, TGs 197 Labs (7/12): K 4.1, creatinine 1.1  Allergies (verified):  No Known Drug Allergies   Family History:  Father: Deceased; MI in his 51s, CABG, CHF  Mother: Alive; HTN, rhematoid arthritis, anemic   Social History:  Married, lives with husband in [place]  disabled: diabetes & fibromyagia  Tobacco Use - No.  Alcohol Use - no  Regular Exercise - no   ROS: All systems reviewed and negative except as per HPI.   Past Medical History:  1. Hypertension  2. Depression  3. DM  4. Obesity  5. GERD  6. CAD: Unstable angina 2/08. PCI to distal codominant CFX with 2.5 x 20 Taxus DES. Lexiscan myoview (3/12): chest pain and dyspnea with stress, EF 62%, small fixed lateral perfusion defect with significant surrounding reversible perfusion defect suggestive of significant ischemia, ischemic ECG response.  LHC (4/12): 80-90% mid and distal CFX stenosis before and after prior-placed stent, total occlusion of a moderate 1st OM.  Patient had DES x 2 to CFX.   7. Fibromyalgia  8. Diastolic CHF: Echo (3/12) with EF 55-60%, mild LV hypertrophy, mild aortic stenosis (mean gradient 13 mmHg), mild MR, normal RV size and systolic function, PA systolic pressure 55 mmHg.  9. Aortic stenosis: Mild by echo 3/12, mean gradient 13 mmHg.  10.  Diabetic retinopathy 11.  H/o vertigo 12.  Peripheral neuropathy  likely from diabetes.    Current Outpatient Prescriptions  Medication Sig Dispense Refill  . albuterol (PROAIR HFA) 108 (90 BASE) MCG/ACT inhaler Inhale 2 puffs into the lungs every 6 (six) hours as needed.        Marland Kitchen amLODipine (NORVASC) 5 MG tablet Take 5 mg by mouth daily.        Marland Kitchen aspirin EC 81 MG EC tablet Take 1 tablet (81 mg total) by mouth daily.      Marland Kitchen atorvastatin (LIPITOR) 40 MG tablet TAKE ONE TABLET BY MOUTH EVERY DAY  30 tablet  6  . buPROPion (WELLBUTRIN SR) 150 MG 12 hr tablet Take 150 mg by mouth 2 (two) times daily.        . carvedilol  (COREG) 12.5 MG tablet Take 1 tablet (12.5 mg total) by mouth 2 (two) times daily.  60 tablet  6  . clopidogrel (PLAVIX) 75 MG tablet Take 1 tablet (75 mg total) by mouth daily.      . DULoxetine (CYMBALTA) 60 MG capsule Take 60 mg by mouth daily.        Marland Kitchen glipiZIDE (GLUCOTROL) 5 MG tablet Take 1 tablet (5 mg total) by mouth 2 (two) times daily before a meal.  60 tablet  6  . ibuprofen (ADVIL,MOTRIN) 800 MG tablet Take 800 mg by mouth as needed.        . insulin glargine (LANTUS) 100 UNIT/ML injection Inject into the skin as directed.       . insulin lispro (HUMALOG) 100 UNIT/ML injection Inject into the skin as directed.       Marland Kitchen lisinopril (PRINIVIL,ZESTRIL) 20 MG tablet 2 tabs po qd       . metFORMIN (GLUCOPHAGE) 1000 MG tablet Take 1,000 mg by mouth 2 (two) times daily.        . pregabalin (LYRICA) 75 MG capsule Take 75 mg by mouth 2 (two) times daily.        Marland Kitchen rOPINIRole (REQUIP) 2 MG tablet Take 2 mg by mouth daily.        . traZODone (DESYREL) 50 MG tablet Take 50 mg by mouth at bedtime.        Marland Kitchen zolpidem (AMBIEN) 10 MG tablet Take 10 mg by mouth at bedtime as needed.        Marland Kitchen DISCONTD: furosemide (LASIX) 40 MG tablet 1 tablet in the morning and 1/2 tablet in the evening   60 tablet  11  . DISCONTD: potassium chloride SA (KLOR-CON M20) 20 MEQ tablet 1 tablet in the morning and 1/2 tablet in the evening   60 tablet  11  . furosemide (LASIX) 40 MG tablet Take two tablets in the morning and one tablet in the afternoon for 3 days, then take one tablet twice a day.  70 tablet  6  . potassium chloride SA (KLOR-CON M20) 20 MEQ tablet Take 1 tablet (20 mEq total) by mouth 2 (two) times daily.  60 tablet  6  . DISCONTD: furosemide (LASIX) 40 MG tablet Take 2 tablets twice a day for 3 days, then decrease to 1 tablet twice a day.  70 tablet  6    BP 122/71  Pulse 74  Ht 5\' 3"  (1.6 m)  Wt 266 lb (120.657 kg)  BMI 47.12 kg/m2  SpO2 82% General: Well developed, well nourished, in no acute  distress. Obese.  Neck: Neck supple, JVP 8-9 cm. No masses, thyromegaly or abnormal cervical nodes.  Lungs: Clear bilaterally  Heart: Non-displaced PMI,  chest non-tender; regular rate and rhythm, S1, S2 without rubs or gallops. 2/6 early systolic murmur RUSB. Carotid upstroke normal, no bruit. Pedals normal pulses. Trace ankle edema bilaterally.  Abdomen: Bowel sounds positive; abdomen soft and non-tender without masses, organomegaly, or hernias noted. No hepatosplenomegaly.  Extremities: No clubbing or cyanosis.  Neurologic: Alert and oriented x 3.  Psych: Normal affect.

## 2011-04-20 NOTE — Patient Instructions (Addendum)
Increase lasix(furosemide) to 80mg  in the morning and 40mg  in the afternoon  for 3 days, then decrease to lasix 40mg  twice a day. This will be two 40mg  lasix in the morning and one 40mg  tablet in the afternoon for 3 days,then one 40mg  lasix twice a day.  Increase KCL(potassium) to 20 mEq twice a day.  A chest x-ray takes a picture of the organs and structures inside the chest, including the heart, lungs, and blood vessels. This test can show several things, including, whether the heart is enlarges; whether fluid is building up in the lungs; and whether pacemaker / defibrillator leads are still in place. TODAY   Your physician has recommended that you have a sleep study. This test records several body functions during sleep, including: brain activity, eye movement, oxygen and carbon dioxide blood levels, heart rate and rhythm, breathing rate and rhythm, the flow of air through your mouth and nose, snoring, body muscle movements, and chest and belly movement.  Dr Shirlee Latch has referred you to Dr Vassie Loll in the [facility] pulmonary department.  Dr Shirlee Latch has referred you to [facility] for oxygen.  Your physician recommends that you schedule a follow-up appointment in: 2 weeks with Dr Shirlee Latch.  Lab in 2 weeks when you see Dr McLean--BMP/BNP 428.32  414.01

## 2011-04-20 NOTE — Assessment & Plan Note (Signed)
Patient has NYHA class III-IV symptoms with hypoxemia at rest and with exertion.  I suspect this is multifactorial.  She does have diastolic CHF and seems volume overloaded.  Weight is up 10 lbs compared to prior appointment.  [name] morbidly obese, and I am concerned that obesity hypoventilation syndrome/OSA could be playing a role here as well.  - Increase Lasix to 80 mg qam, 40 mg qpm x 3 days then 40 mg bid after that.  Increase KCl to 20 mEq bid.  - BMET/BNP in 2 weeks. - Start home oxygen at rest and with exertion. - I will arrange a sleep study and evaluation by pulmonary service for OHS/OSA.   - CXR to make sure that we are not missing pneumonia, etc.   - Followup in this office in 2 weeks.

## 2011-04-23 DIAGNOSIS — G4733 Obstructive sleep apnea (adult) (pediatric): Secondary | ICD-10-CM

## 2011-04-23 NOTE — Procedures (Signed)
Krista Wise, Krista Wise               ACCOUNT NO.:  [id number]  MEDICAL RECORD NO.:  [id number]          PATIENT TYPE:  OUT  LOCATION:  SLEEP CENTER                 FACILITY:  [facility]  PHYSICIAN:  Barbaraann Share, MD,FCCPDATE OF BIRTH:  1947/08/24  DATE OF STUDY:  04/20/2011                           NOCTURNAL POLYSOMNOGRAM  REFERRING PHYSICIAN:  [name], MD  INDICATION FOR STUDY:  Hypersomnia with sleep apnea.  EPWORTH SLEEPINESS SCORE:  Five.  MEDICATIONS:  SLEEP ARCHITECTURE:  The patient had a total sleep time of 328 minutes with no slow wave sleep and very small quantity of REM.  Sleep onset latency was normal at 3.5 minutes and REM onset was normal at 69 minutes.  Sleep efficiency was 77% during the diagnostic portion of the study and 86% during the titration portion.  RESPIRATORY DATA:  The patient underwent a split night protocol where she was found to have 171 obstructive and central events in the first 135 minutes of sleep.  This gave her an apnea/hypopnea index of 76 events per hour.  The events occurred in all body positions, and there was moderate snoring noted throughout.  By protocol, the patient was then fitted with a small ResMed Quattro FX full-face mask, and CPAP titration was initiated.  At an optimal pressure of 11 cm, she had what appeared to be adequate control of her obstructive events and snoring. However, she had very little supine sleep on this setting.  OXYGEN DATA:  There was O2 desaturation as low as 72% with the patient's obstructive events.  The patient, however, was started on oxygen at 2 liters per minute at 2:07 a.m. prior to reaching optimal CPAP pressure.  CARDIAC DATA:  Rare PAC noted, but no clinically significant arrhythmias were seen.  MOVEMENT-PARASOMNIA:  The patient had no significant leg jerks or other abnormal behaviors noted.  IMPRESSIONS-RECOMMENDATIONS: 1. Split night study shows severe obstructive sleep apnea with an AHI     of 76 events per hour and O2 desaturation as low as 72% during the     diagnostic portion of the study.  She was then fitted with a small     ResMed Quattro FX full-face mask, and found to have an optimal CPAP     pressure of 11 cm of water.  She should also be encouraged to work     aggressively on weight loss. 2. Two liters of oxygen was added to the patient's CPAP system prior     to reaching optimal CPAP pressure.  I would recommend starting the     patient on her optimal CPAP pressure, and then rechecking her     overnight     saturations prior to adding supplemental oxygen to her CPAP. 3. Rare PAC noted, but no clinically significant arrhythmias were     noted.     Barbaraann Share, MD,FCCP Diplomate, American Board of Sleep Medicine Electronically Signed    [facility]  D:  04/23/2011 14:35:07  T:  04/23/2011 15:18:20  Job:  [id number]

## 2011-04-29 LAB — BASIC METABOLIC PANEL
BUN: 12
CO2: 29
Chloride: 104
Creatinine, Ser: 0.59

## 2011-04-29 LAB — CBC
MCHC: 33.7
MCV: 74.8 — ABNORMAL LOW
Platelets: 255
RDW: 15.5

## 2011-04-29 LAB — PROTIME-INR: Prothrombin Time: 12.3

## 2011-05-05 ENCOUNTER — Other Ambulatory Visit: Payer: PRIVATE HEALTH INSURANCE | Admitting: *Deleted

## 2011-05-05 ENCOUNTER — Ambulatory Visit: Payer: PRIVATE HEALTH INSURANCE | Admitting: Cardiology

## 2011-05-06 ENCOUNTER — Ambulatory Visit (INDEPENDENT_AMBULATORY_CARE_PROVIDER_SITE_OTHER): Payer: PRIVATE HEALTH INSURANCE | Admitting: Pulmonary Disease

## 2011-05-06 ENCOUNTER — Encounter: Payer: Self-pay | Admitting: Pulmonary Disease

## 2011-05-06 VITALS — BP 136/82 | HR 71 | Temp 98.3°F | Ht 64.0 in | Wt 267.0 lb

## 2011-05-06 DIAGNOSIS — G4733 Obstructive sleep apnea (adult) (pediatric): Secondary | ICD-10-CM | POA: Insufficient documentation

## 2011-05-06 NOTE — Assessment & Plan Note (Signed)
The patient has severe obstructive sleep apnea with significant oxygen desaturation by her recent sleep study.  This is greatly affecting her quality of life, and I suspect a significant contributor to her pulmonary hypertension (in addition to her diastolic heart failure).  I would like to start her on CPAP therapy, and will ultimately have to optimize her pressure at home since she did not have supine sleep on the final pressure setting.  Once her pressure is optimized, we can determine whether she will need supplemental oxygen as well while sleeping on CPAP.  I have encouraged the patient to work aggressively on weight loss.

## 2011-05-06 NOTE — Patient Instructions (Signed)
Will set up on cpap, and see you back in 5weeks to check on progress Work on weight reduction Please call if having any cpap tolerance issues.

## 2011-05-06 NOTE — Progress Notes (Signed)
  Subjective:    Patient ID: Krista Wise, female    DOB: [birth year], 65 y.o.   MRN: [id number]  HPI The patient is a 65 year old female who I've been asked to see for severe obstructive sleep apnea.  She has recently undergone nocturnal polysomnography, and was found to have an AHI of 76 events per hour with severe desaturation.  Her events were significantly improved with a CPAP pressure of 11 cm.  The patient has been noted to have loud snoring by her spouse, but he has never commented on witnessed apneas during sleep.  She does have choking arousals at times.  She typically goes to bed at 11 PM, and arises at 10 AM to start her day.  She has fragmented and frequent awakenings, and is not rested in the mornings upon arising.  She has significant sleepiness with periods of in activity during the day.  She will often catnap.  She also has sleepiness in the evening if not being stimulated.  Of note, her weight is neutral of the last one year by her history.  The patient has also had a recent echo that showed elevation of her pulmonary artery pressures, but a normal right ventricle.  [name] felt to have multifactorial pulmonary hypertension.   Review of Systems  Constitutional: Negative for fever and unexpected weight change.  HENT: Negative for ear pain, nosebleeds, congestion, sore throat, rhinorrhea, sneezing, trouble swallowing, dental problem, postnasal drip and sinus pressure.   Eyes: Negative for redness and itching.  Respiratory: Positive for cough and shortness of breath. Negative for chest tightness and wheezing.   Cardiovascular: Positive for chest pain and leg swelling. Negative for palpitations.  Gastrointestinal: Negative for nausea and vomiting.  Genitourinary: Negative for dysuria.  Musculoskeletal: Positive for joint swelling.  Skin: Negative for rash.  Neurological: Negative for headaches.  Hematological: Does not bruise/bleed easily.  Psychiatric/Behavioral: Positive for  dysphoric mood. The patient is not nervous/anxious.        Objective:   Physical Exam Constitutional:  Obese and debilitated female, no acute distress  HENT:  Nares patent without discharge  Oropharynx without exudate, palate and uvula are thickened and elongated.  Eyes:  Perrla, eomi, no scleral icterus  Neck:  No JVD, no TMG  Cardiovascular:  Normal rate, regular rhythm, no rubs or gallops.  2/6 sem        Intact distal pulses  Pulmonary :  Normal breath sounds, no stridor or respiratory distress   No rales, rhonchi, or wheezing  Abdominal:  Soft, nondistended, bowel sounds present.  No tenderness noted.   Musculoskeletal:  1+ lower extremity edema noted.  Lymph Nodes:  No cervical lymphadenopathy noted  Skin:  No cyanosis noted  Neurologic:  Alert, appropriate, moves all 4 extremities without obvious deficit.         Assessment & Plan:

## 2011-05-20 ENCOUNTER — Other Ambulatory Visit: Payer: Self-pay | Admitting: *Deleted

## 2011-05-20 DIAGNOSIS — I5032 Chronic diastolic (congestive) heart failure: Secondary | ICD-10-CM

## 2011-05-20 DIAGNOSIS — I2510 Atherosclerotic heart disease of native coronary artery without angina pectoris: Secondary | ICD-10-CM

## 2011-05-20 MED ORDER — CLOPIDOGREL BISULFATE 75 MG PO TABS: 75.0000 mg | ORAL_TABLET | Freq: Every day | ORAL | Status: DC

## 2011-05-30 ENCOUNTER — Encounter: Payer: Self-pay | Admitting: Cardiology

## 2011-06-01 ENCOUNTER — Ambulatory Visit (INDEPENDENT_AMBULATORY_CARE_PROVIDER_SITE_OTHER): Payer: PRIVATE HEALTH INSURANCE | Admitting: Cardiology

## 2011-06-01 ENCOUNTER — Encounter: Payer: Self-pay | Admitting: Cardiology

## 2011-06-01 ENCOUNTER — Other Ambulatory Visit: Payer: PRIVATE HEALTH INSURANCE | Admitting: *Deleted

## 2011-06-01 DIAGNOSIS — I10 Essential (primary) hypertension: Secondary | ICD-10-CM

## 2011-06-01 DIAGNOSIS — E785 Hyperlipidemia, unspecified: Secondary | ICD-10-CM

## 2011-06-01 DIAGNOSIS — I2510 Atherosclerotic heart disease of native coronary artery without angina pectoris: Secondary | ICD-10-CM

## 2011-06-01 DIAGNOSIS — I509 Heart failure, unspecified: Secondary | ICD-10-CM

## 2011-06-01 DIAGNOSIS — G4733 Obstructive sleep apnea (adult) (pediatric): Secondary | ICD-10-CM

## 2011-06-01 DIAGNOSIS — R0989 Other specified symptoms and signs involving the circulatory and respiratory systems: Secondary | ICD-10-CM

## 2011-06-01 LAB — BASIC METABOLIC PANEL
BUN: 24 mg/dL — ABNORMAL HIGH (ref 6–23)
Creatinine, Ser: 1.4 mg/dL — ABNORMAL HIGH (ref 0.4–1.2)
GFR: 40.31 mL/min — ABNORMAL LOW (ref >60.00)
Potassium: 3.9 mEq/L (ref 3.5–5.1)

## 2011-06-01 LAB — BRAIN NATRIURETIC PEPTIDE: Pro B Natriuretic peptide (BNP): 91 pg/mL (ref 0.0–100.0)

## 2011-06-01 NOTE — Patient Instructions (Signed)
Your physician recommends that you have  lab work today--BMP/BNP 414.01  428.32   Your physician recommends that you schedule a follow-up appointment in: 2 months with Dr Shirlee Latch.

## 2011-06-02 NOTE — Assessment & Plan Note (Signed)
S/p PCI with DES x 2 to CFX.  [name] not having any chest pain.  She continues to have exertional dyspnea though this is improved.  She will continue Plavix for at least a year and likely long-term given multiple drug eluting stents.  Continue ASA 81, statin, Coreg, and lisinopril.  She has been evaluated by Dr. Ophelia Charter for lumbar spine surgery.  It will not be safe for her to come off Plavix until 5/13 for surgery, and she should hold off unless it is emergent.

## 2011-06-02 NOTE — Assessment & Plan Note (Signed)
BP seems controlled on current regimen.

## 2011-06-02 NOTE — Assessment & Plan Note (Signed)
LDL has been at goal (<70).

## 2011-06-02 NOTE — Progress Notes (Signed)
PCP: [name]  65 yo with history of CAD, DM, HTN, and diastolic CHF presents for followup of dyspnea, orthopnea, and lower extremity edema. She had unstable angina in 2/08 with a stent placed in her distal CFX. I saw her earlier this year due to severe exertional dyspnea (short of breath just walking around her house). She was volume overloaded on exam and hypertensive. I uptitrated her BP meds and started Lasix. She was set up for myoview and echo. The myoview was ischemic by both ECG and perfusion images. There was significant lateral ischemia. Echo showed preserved EF (55-60%), but there was moderate pulmonary hypertension suggesting likely diastolic LV dysfunction.  I took her to the cath lab in 4/12 where left heart cath showed 80-90% mid and distal circumflex stenoses before and after the prior-placed stent and a totally occluded moderate-sized OM1.  She received 2 drug eluting stents in the CFX.   At last appointment, patient had significant exertional dyspnea.  She was found to have hypoxemia at rest and with exertion and I started her on home oxygen.  I was concerned for OHS/OSA, so I had her do a sleep study.  This showed severe sleep apnea and she is now on CPAP.  I additionally increased her Lasix as I thought that she had volume overload from diastolic CHF.  Since last appointment, she has lost 2 lbs.  Oxygen saturation today is 93% on room air.  [name] using oxygen with exertion mainly.  She wears her CPAP at night.  Her breathing has improved some but she still gets short of breath walking around her house.  No chest pain.  [name] compliant with CPAP use.   ECG: NSR, poor anterior R wave progression, lateral T wave inversions and < 1 mm ST depression  Labs (3/12): BNP 417 => 309, K 4.4, creatinine 0.7, HCT 44, free T3/T4 normal, TSH normal, HDL 38, LDL 96.5  Labs (5/12): BNP 422 => 160, K 3.9, creatinine 0.9, HDL 33, LDL 84 Labs (6/12): LDL 50, HDL 34, TGs 197 Labs (7/12): K 4.1,  creatinine 1.1  Allergies (verified):  No Known Drug Allergies   Family History:  Father: Deceased; MI in his 66s, CABG, CHF  Mother: Alive; HTN, rhematoid arthritis, anemic   Social History:  Married, lives with husband in [place]  disabled: diabetes & fibromyagia  Tobacco Use - No.  Alcohol Use - no  Regular Exercise - no   ROS: All systems reviewed and negative except as per HPI.   Past Medical History:  1. Hypertension  2. Depression  3. DM  4. Obesity  5. GERD  6. CAD: Unstable angina 2/08. PCI to distal codominant CFX with 2.5 x 20 Taxus DES. Lexiscan myoview (3/12): chest pain and dyspnea with stress, EF 62%, small fixed lateral perfusion defect with significant surrounding reversible perfusion defect suggestive of significant ischemia, ischemic ECG response.  LHC (4/12): 80-90% mid and distal CFX stenosis before and after prior-placed stent, total occlusion of a moderate 1st OM.  Patient had DES x 2 to CFX.   7. Fibromyalgia  8. Diastolic CHF: Echo (3/12) with EF 55-60%, mild LV hypertrophy, mild aortic stenosis (mean gradient 13 mmHg), mild MR, normal RV size and systolic function, PA systolic pressure 55 mmHg.  9. Aortic stenosis: Mild by echo 3/12, mean gradient 13 mmHg.  10.  Diabetic retinopathy 11.  H/o vertigo 12.  Peripheral neuropathy likely from diabetes.  13. OHS/OSA: Severe OSA on sleep study.  Patient  is using CPAP at night and oxygen with exertion during the day.    Current Outpatient Prescriptions  Medication Sig Dispense Refill  . albuterol (PROAIR HFA) 108 (90 BASE) MCG/ACT inhaler Inhale 2 puffs into the lungs every 6 (six) hours as needed.        Marland Kitchen amLODipine (NORVASC) 5 MG tablet Take 5 mg by mouth daily.        Marland Kitchen aspirin EC 81 MG EC tablet Take 1 tablet (81 mg total) by mouth daily.      Marland Kitchen atorvastatin (LIPITOR) 40 MG tablet TAKE ONE TABLET BY MOUTH EVERY DAY  30 tablet  6  . buPROPion (WELLBUTRIN SR) 150 MG 12 hr tablet Take 150 mg by mouth 2  (two) times daily.        . carvedilol (COREG) 12.5 MG tablet Take 1 tablet (12.5 mg total) by mouth 2 (two) times daily.  60 tablet  6  . clopidogrel (PLAVIX) 75 MG tablet Take 1 tablet (75 mg total) by mouth daily.  30 tablet  6  . DULoxetine (CYMBALTA) 60 MG capsule Take 60 mg by mouth daily.        . furosemide (LASIX) 40 MG tablet Take 40 mg by mouth 2 (two) times daily.        Marland Kitchen glipiZIDE (GLUCOTROL) 5 MG tablet Take 1 tablet (5 mg total) by mouth 2 (two) times daily before a meal.  60 tablet  6  . ibuprofen (ADVIL,MOTRIN) 800 MG tablet Take 800 mg by mouth as needed.        . insulin glargine (LANTUS) 100 UNIT/ML injection Inject into the skin as directed.       . insulin lispro (HUMALOG) 100 UNIT/ML injection Inject into the skin as directed.       Marland Kitchen lisinopril (PRINIVIL,ZESTRIL) 20 MG tablet 2 tabs po qd       . metFORMIN (GLUCOPHAGE) 1000 MG tablet Take 1,000 mg by mouth 2 (two) times daily.        . potassium chloride SA (KLOR-CON M20) 20 MEQ tablet Take 1 tablet (20 mEq total) by mouth 2 (two) times daily.  60 tablet  6  . pregabalin (LYRICA) 75 MG capsule Take 75 mg by mouth 2 (two) times daily.        Marland Kitchen rOPINIRole (REQUIP) 2 MG tablet Take 2 mg by mouth daily.        . traZODone (DESYREL) 50 MG tablet Take 50 mg by mouth at bedtime.        Marland Kitchen zolpidem (AMBIEN) 10 MG tablet Take 10 mg by mouth at bedtime as needed.          BP 132/80  Pulse 71  Resp 18  Ht 5\' 3"  (1.6 m)  Wt 119.75 kg (264 lb)  BMI 46.77 kg/m2  SpO2 93% General: Well developed, well nourished, in no acute distress. Obese.  Neck: Neck supple, JVP 7 cm. No masses, thyromegaly or abnormal cervical nodes.  Lungs: Clear bilaterally  Heart: Non-displaced PMI, chest non-tender; regular rate and rhythm, S1, S2 without rubs or gallops. 2/6 early systolic murmur RUSB. Carotid upstroke normal, no bruit. Pedals normal pulses. Trace ankle edema bilaterally.  Abdomen: Bowel sounds positive; abdomen soft and non-tender  without masses, organomegaly, or hernias noted. No hepatosplenomegaly.  Extremities: No clubbing or cyanosis.  Neurologic: Alert and oriented x 3.  Psych: Normal affect.

## 2011-06-02 NOTE — Assessment & Plan Note (Signed)
Chronic diastolic CHF.  Volume looks better today on increased Lasix.  Still with NYHA class IIIb symptoms.  Obesity and deconditioning as well as OHS/OSA play a role in her dyspnea. Continue current Lasix dose.  I will get a BMET and BNP today.

## 2011-06-02 NOTE — Assessment & Plan Note (Signed)
Severe OSA by sleep study.  Suspect she also has a component of OHS.  [name] using oxygen during the day with exertion.   I encouraged her to restart rehab.

## 2011-06-07 ENCOUNTER — Ambulatory Visit: Payer: PRIVATE HEALTH INSURANCE | Admitting: Pulmonary Disease

## 2011-06-12 ENCOUNTER — Other Ambulatory Visit: Payer: Self-pay | Admitting: Cardiology

## 2011-06-30 ENCOUNTER — Ambulatory Visit: Payer: PRIVATE HEALTH INSURANCE | Admitting: Pulmonary Disease

## 2011-07-07 ENCOUNTER — Other Ambulatory Visit: Payer: Self-pay | Admitting: Cardiology

## 2011-07-18 ENCOUNTER — Other Ambulatory Visit: Payer: Self-pay | Admitting: Cardiology

## 2011-08-01 ENCOUNTER — Encounter: Payer: Self-pay | Admitting: Cardiology

## 2011-08-01 ENCOUNTER — Ambulatory Visit (INDEPENDENT_AMBULATORY_CARE_PROVIDER_SITE_OTHER): Payer: Medicare Other | Admitting: Cardiology

## 2011-08-01 DIAGNOSIS — I509 Heart failure, unspecified: Secondary | ICD-10-CM

## 2011-08-01 DIAGNOSIS — E785 Hyperlipidemia, unspecified: Secondary | ICD-10-CM

## 2011-08-01 DIAGNOSIS — G4733 Obstructive sleep apnea (adult) (pediatric): Secondary | ICD-10-CM

## 2011-08-01 DIAGNOSIS — I5032 Chronic diastolic (congestive) heart failure: Secondary | ICD-10-CM

## 2011-08-01 DIAGNOSIS — I2510 Atherosclerotic heart disease of native coronary artery without angina pectoris: Secondary | ICD-10-CM

## 2011-08-01 NOTE — Patient Instructions (Signed)
Your physician recommends that you return for a FASTING lipid profile /liver profile/BMET 414.01 428.32  Your physician recommends that you schedule a follow-up appointment in: 4 months with Dr Shirlee Latch. (May 2013)

## 2011-08-02 NOTE — Assessment & Plan Note (Signed)
Severe OSA by sleep study.  Suspect she also has a component of OHS.  [name] using oxygen during the day with exertion.  She has not tolerated the CPAP mask.  I asked her to contact pulmonary to see if there is another type of mask she could try.

## 2011-08-02 NOTE — Assessment & Plan Note (Signed)
S/p PCI with DES x 2 to CFX.  [name] not having any chest pain.  She continues to have exertional dyspnea though this is improved.  She will continue Plavix for at least a year and likely long-term given multiple drug eluting stents.  Continue ASA 81, statin, Coreg, and lisinopril.  She has been evaluated by Dr. Yates for lumbar spine surgery.  It will not be safe for her to come off Plavix until 5/13 for surgery, and she should hold off unless it is emergent.   

## 2011-08-02 NOTE — Assessment & Plan Note (Signed)
Check lipids/LFTs today with goal LDL < 70.  

## 2011-08-02 NOTE — Assessment & Plan Note (Addendum)
Chronic diastolic CHF.  Volume looks ok today.  NYHA class III symptoms but improved.  Obesity and deconditioning as well as OHS/OSA play a role in her dyspnea. Continue current Lasix dose.  BMET today.   I would like her to restart physical therapy.

## 2011-08-02 NOTE — Progress Notes (Signed)
PCP: [name]  66 yo with history of CAD, DM, HTN, and diastolic CHF presents for followup of dyspnea, orthopnea, and lower extremity edema. She had unstable angina in 2/08 with a stent placed in her distal CFX. I saw her earlier this year due to severe exertional dyspnea (short of breath just walking around her house). She was volume overloaded on exam and hypertensive. I uptitrated her BP meds and started Lasix. She was set up for myoview and echo. The myoview was ischemic by both ECG and perfusion images. There was significant lateral ischemia. Echo showed preserved EF (55-60%), but there was moderate pulmonary hypertension suggesting likely diastolic LV dysfunction.  I took her to the cath lab in 4/12 where left heart cath showed 80-90% mid and distal circumflex stenoses before and after the prior-placed stent and a totally occluded moderate-sized OM1.  She received 2 drug eluting stents in the CFX.   Krista Wise seems to be doing better today.  She still has mild dyspnea walking around her house with her walker but this is improved.  More significant limitation at this point is low back pain.  She has lost another 2 lbs since last appointment.  No chest pain.  [name] planning to restart PT.  [name] using oxygen with exertion mainly.  She wears her CPAP at night.  Her breathing has improved some but she still gets short of breath walking around her house.  She cannot tolerate CPAP.    Labs (3/12): BNP 417 => 309, K 4.4, creatinine 0.7, HCT 44, free T3/T4 normal, TSH normal, HDL 38, LDL 96.5  Labs (5/12): BNP 422 => 160, K 3.9, creatinine 0.9, HDL 33, LDL 84 Labs (6/12): LDL 50, HDL 34, TGs 197 Labs (7/12): K 4.1, creatinine 1.1 Labs (11/12): K 3.9, creatinine 1.4, BNP 91  Allergies (verified):  No Known Drug Allergies   Family History:  Father: Deceased; MI in his 75s, CABG, CHF  Mother: Alive; HTN, rhematoid arthritis, anemic   Social History:  Married, lives with husband in [place]   disabled: diabetes & fibromyagia  Tobacco Use - No.  Alcohol Use - no  Regular Exercise - no   Past Medical History:  1. Hypertension  2. Depression  3. DM  4. Obesity  5. GERD  6. CAD: Unstable angina 2/08. PCI to distal codominant CFX with 2.5 x 20 Taxus DES. Lexiscan myoview (3/12): chest pain and dyspnea with stress, EF 62%, small fixed lateral perfusion defect with significant surrounding reversible perfusion defect suggestive of significant ischemia, ischemic ECG response.  LHC (4/12): 80-90% mid and distal CFX stenosis before and after prior-placed stent, total occlusion of a moderate 1st OM.  Patient had DES x 2 to CFX.   7. Fibromyalgia  8. Diastolic CHF: Echo (3/12) with EF 55-60%, mild LV hypertrophy, mild aortic stenosis (mean gradient 13 mmHg), mild MR, normal RV size and systolic function, PA systolic pressure 55 mmHg.  9. Aortic stenosis: Mild by echo 3/12, mean gradient 13 mmHg.  10.  Diabetic retinopathy 11.  H/o vertigo 12.  Peripheral neuropathy likely from diabetes.  13. OHS/OSA: Severe OSA on sleep study.  Patient is off CPAP currently b/c she could not tolerate the mask.    Current Outpatient Prescriptions  Medication Sig Dispense Refill  . acetaminophen (TYLENOL) 650 MG CR tablet Take 650 mg by mouth every 8 (eight) hours as needed.        Marland Kitchen albuterol (PROAIR HFA) 108 (90 BASE) MCG/ACT inhaler Inhale  2 puffs into the lungs every 6 (six) hours as needed.        Marland Kitchen amLODipine (NORVASC) 5 MG tablet TAKE ONE TABLET BY MOUTH EVERY DAY  30 tablet  6  . aspirin EC 81 MG EC tablet Take 1 tablet (81 mg total) by mouth daily.      Marland Kitchen atorvastatin (LIPITOR) 40 MG tablet TAKE ONE TABLET BY MOUTH EVERY DAY  30 tablet  6  . buPROPion (WELLBUTRIN SR) 150 MG 12 hr tablet Take 150 mg by mouth 2 (two) times daily.        . carvedilol (COREG) 12.5 MG tablet TAKE ONE TABLET BY MOUTH TWICE DAILY  60 tablet  2  . clopidogrel (PLAVIX) 75 MG tablet Take 1 tablet (75 mg total) by mouth  daily.  30 tablet  6  . DULoxetine (CYMBALTA) 60 MG capsule Take 60 mg by mouth daily.        . furosemide (LASIX) 40 MG tablet Take 40 mg by mouth 2 (two) times daily.        Marland Kitchen glipiZIDE (GLUCOTROL) 5 MG tablet TAKE ONE TABLET BY MOUTH TWICE DAILY BEFORE A MEAL.  60 tablet  4  . ibuprofen (ADVIL,MOTRIN) 800 MG tablet Take 800 mg by mouth as needed.        . insulin glargine (LANTUS) 100 UNIT/ML injection Inject into the skin as directed.       . insulin lispro (HUMALOG) 100 UNIT/ML injection Inject into the skin as directed.       Marland Kitchen lisinopril (PRINIVIL,ZESTRIL) 20 MG tablet 2 tabs po qd       . metFORMIN (GLUCOPHAGE) 1000 MG tablet Take 1,000 mg by mouth 2 (two) times daily.        . Multiple Vitamin (MULTIVITAMIN) tablet Take 1 tablet by mouth daily.        . potassium chloride SA (KLOR-CON M20) 20 MEQ tablet Take 1 tablet (20 mEq total) by mouth 2 (two) times daily.  60 tablet  6  . pregabalin (LYRICA) 75 MG capsule Take 75 mg by mouth 2 (two) times daily.        Marland Kitchen rOPINIRole (REQUIP) 2 MG tablet Take 2 mg by mouth daily.        . traZODone (DESYREL) 50 MG tablet Take 50 mg by mouth at bedtime.        Marland Kitchen zolpidem (AMBIEN) 10 MG tablet Take 10 mg by mouth at bedtime as needed.          BP 98/58  Pulse 80  Ht 5\' 5"  (1.651 m)  Wt 118.842 kg (262 lb)  BMI 43.60 kg/m2 General: Well developed, well nourished, in no acute distress. Obese.  Neck: Neck supple, JVP 7 cm. No masses, thyromegaly or abnormal cervical nodes.  Lungs: Clear bilaterally  Heart: Non-displaced PMI, chest non-tender; regular rate and rhythm, S1, S2 without rubs or gallops. 2/6 early systolic murmur RUSB. Carotid upstroke normal, no bruit. Pedals normal pulses. No edema.  Abdomen: Bowel sounds positive; abdomen soft and non-tender without masses, organomegaly, or hernias noted. No hepatosplenomegaly.  Extremities: No clubbing or cyanosis.  Neurologic: Alert and oriented x 3.  Psych: Normal affect.

## 2011-08-29 ENCOUNTER — Other Ambulatory Visit: Payer: Self-pay

## 2011-08-29 ENCOUNTER — Emergency Department (HOSPITAL_COMMUNITY): Payer: Medicare Other

## 2011-08-29 ENCOUNTER — Encounter (HOSPITAL_COMMUNITY): Payer: Self-pay

## 2011-08-29 ENCOUNTER — Inpatient Hospital Stay (HOSPITAL_COMMUNITY)
Admission: EM | Admit: 2011-08-29 | Discharge: 2011-09-06 | DRG: 154 | Disposition: A | Discharge status: Home Health | Payer: Medicare Other | Attending: Family Medicine | Admitting: Family Medicine

## 2011-08-29 DIAGNOSIS — IMO0001 Reserved for inherently not codable concepts without codable children: Secondary | ICD-10-CM | POA: Diagnosis present

## 2011-08-29 DIAGNOSIS — Z794 Long term (current) use of insulin: Secondary | ICD-10-CM

## 2011-08-29 DIAGNOSIS — R2681 Unsteadiness on feet: Secondary | ICD-10-CM

## 2011-08-29 DIAGNOSIS — Z9861 Coronary angioplasty status: Secondary | ICD-10-CM

## 2011-08-29 DIAGNOSIS — J209 Acute bronchitis, unspecified: Secondary | ICD-10-CM | POA: Diagnosis present

## 2011-08-29 DIAGNOSIS — I2789 Other specified pulmonary heart diseases: Secondary | ICD-10-CM | POA: Diagnosis present

## 2011-08-29 DIAGNOSIS — I5033 Acute on chronic diastolic (congestive) heart failure: Secondary | ICD-10-CM | POA: Diagnosis not present

## 2011-08-29 DIAGNOSIS — R0902 Hypoxemia: Secondary | ICD-10-CM | POA: Diagnosis present

## 2011-08-29 DIAGNOSIS — E1149 Type 2 diabetes mellitus with other diabetic neurological complication: Secondary | ICD-10-CM | POA: Diagnosis present

## 2011-08-29 DIAGNOSIS — K219 Gastro-esophageal reflux disease without esophagitis: Secondary | ICD-10-CM | POA: Diagnosis present

## 2011-08-29 DIAGNOSIS — R7989 Other specified abnormal findings of blood chemistry: Secondary | ICD-10-CM

## 2011-08-29 DIAGNOSIS — E662 Morbid (severe) obesity with alveolar hypoventilation: Secondary | ICD-10-CM | POA: Diagnosis present

## 2011-08-29 DIAGNOSIS — E162 Hypoglycemia, unspecified: Secondary | ICD-10-CM

## 2011-08-29 DIAGNOSIS — N289 Disorder of kidney and ureter, unspecified: Secondary | ICD-10-CM | POA: Diagnosis present

## 2011-08-29 DIAGNOSIS — E11319 Type 2 diabetes mellitus with unspecified diabetic retinopathy without macular edema: Secondary | ICD-10-CM | POA: Diagnosis present

## 2011-08-29 DIAGNOSIS — E872 Acidosis, unspecified: Secondary | ICD-10-CM | POA: Diagnosis not present

## 2011-08-29 DIAGNOSIS — Z79899 Other long term (current) drug therapy: Secondary | ICD-10-CM

## 2011-08-29 DIAGNOSIS — I10 Essential (primary) hypertension: Secondary | ICD-10-CM

## 2011-08-29 DIAGNOSIS — Z7982 Long term (current) use of aspirin: Secondary | ICD-10-CM

## 2011-08-29 DIAGNOSIS — R0602 Shortness of breath: Secondary | ICD-10-CM

## 2011-08-29 DIAGNOSIS — I2510 Atherosclerotic heart disease of native coronary artery without angina pectoris: Secondary | ICD-10-CM | POA: Diagnosis present

## 2011-08-29 DIAGNOSIS — F329 Major depressive disorder, single episode, unspecified: Secondary | ICD-10-CM | POA: Diagnosis present

## 2011-08-29 DIAGNOSIS — E785 Hyperlipidemia, unspecified: Secondary | ICD-10-CM

## 2011-08-29 DIAGNOSIS — E1139 Type 2 diabetes mellitus with other diabetic ophthalmic complication: Secondary | ICD-10-CM | POA: Diagnosis present

## 2011-08-29 DIAGNOSIS — F3289 Other specified depressive episodes: Secondary | ICD-10-CM | POA: Diagnosis present

## 2011-08-29 DIAGNOSIS — I129 Hypertensive chronic kidney disease with stage 1 through stage 4 chronic kidney disease, or unspecified chronic kidney disease: Secondary | ICD-10-CM | POA: Diagnosis present

## 2011-08-29 DIAGNOSIS — J9620 Acute and chronic respiratory failure, unspecified whether with hypoxia or hypercapnia: Secondary | ICD-10-CM | POA: Diagnosis not present

## 2011-08-29 DIAGNOSIS — G4733 Obstructive sleep apnea (adult) (pediatric): Principal | ICD-10-CM | POA: Diagnosis present

## 2011-08-29 DIAGNOSIS — I509 Heart failure, unspecified: Secondary | ICD-10-CM | POA: Diagnosis present

## 2011-08-29 DIAGNOSIS — N189 Chronic kidney disease, unspecified: Secondary | ICD-10-CM | POA: Diagnosis present

## 2011-08-29 DIAGNOSIS — E1169 Type 2 diabetes mellitus with other specified complication: Secondary | ICD-10-CM | POA: Diagnosis present

## 2011-08-29 DIAGNOSIS — E669 Obesity, unspecified: Secondary | ICD-10-CM | POA: Diagnosis present

## 2011-08-29 DIAGNOSIS — R42 Dizziness and giddiness: Secondary | ICD-10-CM

## 2011-08-29 DIAGNOSIS — I5032 Chronic diastolic (congestive) heart failure: Secondary | ICD-10-CM

## 2011-08-29 DIAGNOSIS — E876 Hypokalemia: Secondary | ICD-10-CM | POA: Diagnosis present

## 2011-08-29 DIAGNOSIS — I214 Non-ST elevation (NSTEMI) myocardial infarction: Secondary | ICD-10-CM

## 2011-08-29 DIAGNOSIS — I272 Pulmonary hypertension, unspecified: Secondary | ICD-10-CM

## 2011-08-29 DIAGNOSIS — Z7902 Long term (current) use of antithrombotics/antiplatelets: Secondary | ICD-10-CM

## 2011-08-29 DIAGNOSIS — E1142 Type 2 diabetes mellitus with diabetic polyneuropathy: Secondary | ICD-10-CM | POA: Diagnosis present

## 2011-08-29 LAB — DIFFERENTIAL
Basophils Absolute: 0 10*3/uL (ref 0.0–0.1)
Basophils Relative: 1 % (ref 0–1)
Eosinophils Relative: 1 % (ref 0–5)
Monocytes Absolute: 0.7 10*3/uL (ref 0.1–1.0)

## 2011-08-29 LAB — BASIC METABOLIC PANEL
CO2: 32 mEq/L (ref 19–32)
Calcium: 10.4 mg/dL (ref 8.4–10.5)
Creatinine, Ser: 1.84 mg/dL — ABNORMAL HIGH (ref 0.50–1.10)
GFR calc Af Amer: 33 mL/min — ABNORMAL LOW (ref >90)

## 2011-08-29 LAB — URINALYSIS, ROUTINE W REFLEX MICROSCOPIC
Glucose, UA: NEGATIVE mg/dL
Ketones, ur: 15 mg/dL — AB
Protein, ur: NEGATIVE mg/dL

## 2011-08-29 LAB — GLUCOSE, CAPILLARY: Glucose-Capillary: 125 mg/dL — ABNORMAL HIGH (ref 70–99)

## 2011-08-29 LAB — URINE MICROSCOPIC-ADD ON

## 2011-08-29 LAB — CBC
HCT: 36.4 % (ref 36.0–46.0)
MCHC: 30.8 g/dL (ref 30.0–36.0)
MCV: 87.7 fL (ref 78.0–100.0)
RDW: 16.7 % — ABNORMAL HIGH (ref 11.5–15.5)

## 2011-08-29 LAB — CARDIAC PANEL(CRET KIN+CKTOT+MB+TROPI)
CK, MB: 2.2 ng/mL (ref 0.3–4.0)
Relative Index: INVALID (ref 0.0–2.5)
Total CK: 67 U/L (ref 7–177)
Troponin I: 0.63 ng/mL (ref <0.30)

## 2011-08-29 MED ORDER — CARVEDILOL 12.5 MG PO TABS
12.5000 mg | ORAL_TABLET | Freq: Two times a day (BID) | ORAL | Status: DC
  Filled 2011-08-29 (×3): qty 1

## 2011-08-29 MED ORDER — ONDANSETRON HCL 4 MG/2ML IJ SOLN
4.0000 mg | Freq: Once | INTRAMUSCULAR | Status: AC
  Administered 2011-08-29: 4 mg via INTRAVENOUS
  Filled 2011-08-29: qty 2

## 2011-08-29 MED ORDER — MORPHINE SULFATE 2 MG/ML IJ SOLN: 2.0000 mg | INTRAMUSCULAR | Status: DC | PRN

## 2011-08-29 MED ORDER — ROPINIROLE HCL 1 MG PO TABS
2.0000 mg | ORAL_TABLET | Freq: Every day | ORAL | Status: DC
  Administered 2011-08-29: 2 mg via ORAL
  Filled 2011-08-29 (×2): qty 2

## 2011-08-29 MED ORDER — SODIUM CHLORIDE 0.9 % IJ SOLN
3.0000 mL | Freq: Two times a day (BID) | INTRAMUSCULAR | Status: DC
  Administered 2011-08-29 – 2011-09-05 (×12): 3 mL via INTRAVENOUS

## 2011-08-29 MED ORDER — HEPARIN (PORCINE) IN NACL 100-0.45 UNIT/ML-% IJ SOLN
1250.0000 [IU]/h | INTRAMUSCULAR | Status: DC
  Administered 2011-08-29: 1000 [IU]/h via INTRAVENOUS
  Administered 2011-08-30 (×2): 1250 [IU]/h via INTRAVENOUS
  Filled 2011-08-29 (×2): qty 250

## 2011-08-29 MED ORDER — INSULIN GLARGINE 100 UNIT/ML ~~LOC~~ SOLN
20.0000 [IU] | Freq: Every day | SUBCUTANEOUS | Status: DC
  Administered 2011-08-29: 20 [IU] via SUBCUTANEOUS
  Filled 2011-08-29: qty 3

## 2011-08-29 MED ORDER — CLOPIDOGREL BISULFATE 75 MG PO TABS
75.0000 mg | ORAL_TABLET | Freq: Every day | ORAL | Status: DC
  Administered 2011-08-30 – 2011-09-06 (×8): 75 mg via ORAL
  Filled 2011-08-29 (×10): qty 1

## 2011-08-29 MED ORDER — PREGABALIN 50 MG PO CAPS
75.0000 mg | ORAL_CAPSULE | Freq: Two times a day (BID) | ORAL | Status: DC
  Administered 2011-08-29: 75 mg via ORAL
  Filled 2011-08-29: qty 1

## 2011-08-29 MED ORDER — FUROSEMIDE 40 MG PO TABS
40.0000 mg | ORAL_TABLET | Freq: Two times a day (BID) | ORAL | Status: DC
  Administered 2011-08-29 – 2011-09-01 (×6): 40 mg via ORAL
  Filled 2011-08-29 (×8): qty 1

## 2011-08-29 MED ORDER — HEPARIN BOLUS VIA INFUSION
4000.0000 [IU] | Freq: Once | INTRAVENOUS | Status: AC
  Administered 2011-08-29: 4000 [IU] via INTRAVENOUS

## 2011-08-29 MED ORDER — ROSUVASTATIN CALCIUM 20 MG PO TABS
20.0000 mg | ORAL_TABLET | Freq: Every day | ORAL | Status: DC
  Administered 2011-08-30 – 2011-09-05 (×7): 20 mg via ORAL
  Filled 2011-08-29 (×8): qty 1

## 2011-08-29 MED ORDER — BIOTENE DRY MOUTH MT LIQD
15.0000 mL | Freq: Two times a day (BID) | OROMUCOSAL | Status: DC
  Administered 2011-08-30 – 2011-09-05 (×13): 15 mL via OROMUCOSAL

## 2011-08-29 MED ORDER — MECLIZINE HCL 12.5 MG PO TABS
12.5000 mg | ORAL_TABLET | Freq: Three times a day (TID) | ORAL | Status: DC | PRN
  Filled 2011-08-29: qty 1

## 2011-08-29 MED ORDER — ROPINIROLE HCL 1 MG PO TABS: 2.0000 mg | ORAL_TABLET | Freq: Every day | ORAL | Status: DC

## 2011-08-29 MED ORDER — ASPIRIN 81 MG PO CHEW
324.0000 mg | CHEWABLE_TABLET | Freq: Once | ORAL | Status: AC
  Administered 2011-08-29: 324 mg via ORAL
  Filled 2011-08-29: qty 4

## 2011-08-29 MED ORDER — POTASSIUM CHLORIDE CRYS ER 20 MEQ PO TBCR
20.0000 meq | EXTENDED_RELEASE_TABLET | Freq: Two times a day (BID) | ORAL | Status: DC
  Administered 2011-08-29 – 2011-08-30 (×3): 20 meq via ORAL
  Filled 2011-08-29 (×5): qty 1

## 2011-08-29 MED ORDER — DEXTROSE 50 % IV SOLN
INTRAVENOUS | Status: AC
  Administered 2011-08-29: 18:00:00
  Filled 2011-08-29: qty 50

## 2011-08-29 MED ORDER — ASPIRIN EC 81 MG PO TBEC
81.0000 mg | DELAYED_RELEASE_TABLET | Freq: Every day | ORAL | Status: DC
  Administered 2011-08-30 – 2011-09-05 (×7): 81 mg via ORAL
  Filled 2011-08-29 (×8): qty 1

## 2011-08-29 MED ORDER — FUROSEMIDE 40 MG PO TABS: 40.0000 mg | ORAL_TABLET | Freq: Two times a day (BID) | ORAL | Status: DC

## 2011-08-29 MED ORDER — INSULIN ASPART 100 UNIT/ML ~~LOC~~ SOLN
0.0000 [IU] | Freq: Three times a day (TID) | SUBCUTANEOUS | Status: DC
  Administered 2011-08-30 – 2011-08-31 (×4): 3 [IU] via SUBCUTANEOUS
  Administered 2011-08-31: 11 [IU] via SUBCUTANEOUS
  Filled 2011-08-29: qty 3

## 2011-08-29 MED ORDER — BUPROPION HCL ER (SR) 150 MG PO TB12
150.0000 mg | ORAL_TABLET | Freq: Two times a day (BID) | ORAL | Status: DC
  Filled 2011-08-29 (×3): qty 1

## 2011-08-29 NOTE — ED Provider Notes (Signed)
History     CSN: [id number]  Arrival date & time 08/29/11  1723   First MD Initiated Contact with Patient 08/29/11 1746      Chief Complaint  Patient presents with  . Fatigue    Pt has recent dx of bronchitis, not eating well or feeling well for past few days.  Pt was placed on ABX, non productive cough.  Pt normally uses walker to ambulate a few steps but has been weak and not walking for past few days.    (Consider location/radiation/quality/duration/timing/severity/associated sxs/prior treatment) The history is provided by the patient, medical records and a relative. The history is limited by the condition of the patient.   the patient is a 66 year old, female, who has been recently diagnosed with bronchitis.  She complains of a nonproductive cough, nausea, and fatigue.  She denies chest pain, vomiting, fevers, chills, or sweating.  She denies diarrhea.  She denies any urinary tract symptoms.  She took her insulin and oral hypoglycemic agent today, but did not have lunch.  Upon arrival, she was somnolent, with a blood sugar of 52  Past Medical History  Diagnosis Date  . Hypertension   . Depression   . Diabetes mellitus   . GERD (gastroesophageal reflux disease)   . Obesity   . Coronary artery disease 08/2006    Unstable angina. PCI to distal codominant CFX with 2.5 x 20 Taxus DES...    Past Surgical History  Procedure Date  . Coronary stent placement 08/2006    CAD; stent placed @ [facility]  . Total abdominal hysterectomy     Family History  Problem Relation Age of Onset  . Heart attack Father   . Heart failure Father   . Anemia Mother   . Hypertension Mother     History  Substance Use Topics  . Smoking status: Never Smoker   . Smokeless tobacco: Never Used  . Alcohol Use: No    OB History    Grav Para Term Preterm Abortions TAB SAB Ect Mult Living                  Review of Systems  Constitutional: Positive for fatigue. Negative for fever and chills.    Respiratory: Positive for cough. Negative for chest tightness.   Cardiovascular: Negative for chest pain.  Gastrointestinal: Positive for nausea. Negative for vomiting.  Neurological: Positive for weakness. Negative for headaches.  All other systems reviewed and are negative.    Allergies  Review of patient's allergies indicates no known allergies.  Home Medications   Current Outpatient Rx  Name Route Sig Dispense Refill  . ALBUTEROL SULFATE HFA 108 (90 BASE) MCG/ACT IN AERS Inhalation Inhale 2 puffs into the lungs every 6 (six) hours as needed. For shortness of breath.    . AMLODIPINE BESYLATE 5 MG PO TABS Oral Take 5 mg by mouth daily.    . ASPIRIN EC 81 MG PO TBEC Oral Take 1 tablet (81 mg total) by mouth daily.    . ATORVASTATIN CALCIUM 40 MG PO TABS Oral Take 40 mg by mouth daily.    . BUPROPION HCL ER (SR) 150 MG PO TB12 Oral Take 150 mg by mouth 2 (two) times daily.    Marland Kitchen CARVEDILOL 12.5 MG PO TABS Oral Take 12.5 mg by mouth 2 (two) times daily with a meal.    . CLOPIDOGREL BISULFATE 75 MG PO TABS Oral Take 75 mg by mouth daily.    . FUROSEMIDE 40 MG PO  TABS Oral Take 40 mg by mouth 2 (two) times daily.      Marland Kitchen GLIPIZIDE 5 MG PO TABS Oral Take 5 mg by mouth 2 (two) times daily before a meal.    . INSULIN ASPART 100 UNIT/ML [place] SOLN Subcutaneous Inject 90 Units into the skin every morning.    . INSULIN LISPRO (HUMAN) 100 UNIT/ML [place] SOLN Subcutaneous Inject 40 Units into the skin every morning.    Marland Kitchen LISINOPRIL 20 MG PO TABS Oral Take 40 mg by mouth at bedtime.     Marland Kitchen MECLIZINE HCL 12.5 MG PO TABS Oral Take 12.5 mg by mouth 3 (three) times daily as needed. For nausea.    Marland Kitchen METFORMIN HCL 1000 MG PO TABS Oral Take 1,000 mg by mouth 2 (two) times daily.      Marland Kitchen ONE-DAILY MULTI VITAMINS PO TABS Oral Take 1 tablet by mouth daily.      Marland Kitchen POTASSIUM CHLORIDE CRYS ER 20 MEQ PO TBCR Oral Take 20 mEq by mouth 2 (two) times daily.    Marland Kitchen PREGABALIN 75 MG PO CAPS Oral Take 75 mg by mouth 2 (two)  times daily.      Marland Kitchen ROPINIROLE HCL 2 MG PO TABS Oral Take 2 mg by mouth daily.      Marland Kitchen SIMVASTATIN 40 MG PO TABS Oral Take 40 mg by mouth every evening.    Marland Kitchen TRAMADOL HCL 50 MG PO TABS Oral Take 50-100 mg by mouth 2 (two) times daily as needed. For pain.    . TRAZODONE HCL 50 MG PO TABS Oral Take 50-100 mg by mouth at bedtime as needed. For sleep.    Marland Kitchen ZOLPIDEM TARTRATE 10 MG PO TABS Oral Take 10 mg by mouth at bedtime as needed. For sleep.      BP 112/43  Pulse 74  Temp(Src) 99.4 F (37.4 C) (Oral)  Resp 19  SpO2 94%  Physical Exam  Vitals reviewed. Constitutional: [name] oriented to person, place, and time.       Morbidly obese somnolent  HENT:  Head: Normocephalic and atraumatic.  Eyes: Conjunctivae are normal. Pupils are equal, round, and reactive to light.  Neck: Normal range of motion. Neck supple.  Cardiovascular: Normal rate.   Murmur heard. Pulmonary/Chest: Effort normal. No respiratory distress. She has no wheezes. She has no rales.  Abdominal: Soft. There is no tenderness.  Neurological: [name] oriented to person, place, and time. No cranial nerve deficit.       Arousable but drifts off to sleep frequently until an amp of D50 was given and then, she was alert and spoke fluently, and oriented fashion  Skin: Skin is warm and dry.  Psychiatric: She has a normal mood and affect. Thought content normal.    ED Course  Procedures (including critical care time) 66 year old, female, with a history of coronary artery disease, and insulin-dependent diabetes.  Presents with a nonproductive cough, fatigue, and nausea.  Her EKG shows ST segment changes in the anterior lateral leads.  This is different than her prior EKG from July of 2012.  We will perform laboratory testing, including cardiac enzymes, and a chest x-ray,  Labs Reviewed  GLUCOSE, CAPILLARY - Abnormal; Notable for the following:    Glucose-Capillary 52 (*)    All other components within normal limits  GLUCOSE,  CAPILLARY - Abnormal; Notable for the following:    Glucose-Capillary 171 (*)    All other components within normal limits  CBC - Abnormal; Notable for the  following:    Hemoglobin 11.2 (*)    RDW 16.7 (*)    All other components within normal limits  BASIC METABOLIC PANEL - Abnormal; Notable for the following:    Glucose, Bld 145 (*)    Creatinine, Ser 1.84 (*)    GFR calc non Af Amer 28 (*)    GFR calc Af Amer 33 (*)    All other components within normal limits  POCT I-STAT TROPONIN I - Abnormal; Notable for the following:    Troponin i, poc 0.15 (*)    All other components within normal limits  DIFFERENTIAL  URINALYSIS, ROUTINE W REFLEX MICROSCOPIC   Dg Chest Port 1 View  08/29/2011  *RADIOLOGY REPORT*  Clinical Data: Nausea, vomiting, weakness  PORTABLE CHEST - 1 VIEW  Comparison: 04/20/2011  Findings: Stable cardiomegaly.  Perihilar and bibasilar interstitial and airspace edema or infiltrates   slightly increased since previous study.  No definite effusion.  Regional bones unremarkable.  IMPRESSION:  1.  Stable cardiomegaly with some increase in bilateral edema/infiltrates.  Original Report Authenticated By: Osa Craver, M.D.     No diagnosis found.  ED ECG REPORT   Date: 08/29/2011  EKG Time: 8:01 PM  Rate: 74  Rhythm: normal sinus rhythm, sttw depression and inversion in antlat leads  Axis: right  Intervals:none  ST&T Change: st depression with tw inversions  Narrative Interpretation: nsr with new sttw changes suggestive of ischemia     8:02 PM i spoke with Dr. Myrtis Ser. He will consult but requests medicine admission.       MDM  nonstemi Hypoglycemia resolved.        Nicholes Stairs, MD 08/30/11 [phone]

## 2011-08-29 NOTE — Consult Note (Signed)
Cardiology Consult Note  No ref. provider found Reason for consult:  Positive troponin  History of Present Illness (and review of medical records): Krista Wise is a 66 y.o. female who presented to ED after hypoglycemic episode at home.  [name] a known diabetic on insulin therapy.  She reports that she had had a dry cough over the past 3 weeks that has gotten progressively worse.  This has been associated with subjective fevers, wheezing and decreased po intake.  She was seen at urgent care on Sat and given antibiotics and cough suppressant.  She had no significant improvement.  She and family reported prior episode of FBG in 30s due to decrease oral intake and taking her insulin.  She remained taking her daily insulin and today had repeat low FBG, where she was unresponsive.  She had no chest pain, shortness of breath, PND, or orthopnea.  LE swelling has been at baseline.  Of note she has past medical history of CAD, DM, HTN, and diastolic CHF. She had unstable angina in 2/08 with a stent placed in her distal CFX.   She last had myoview which was ischemic by both ECG and perfusion images. There was significant lateral ischemia. Echo showed preserved EF (55-60%), but there was moderate pulmonary hypertension suggesting likely diastolic LV dysfunction. I took her to the cath lab in 4/12 where left heart cath showed 80-90% mid and distal circumflex stenoses before and after the prior-placed stent and a totally occluded moderate-sized OM1. She received 2 drug eluting stents in the CFX.    Past Medical History  Diagnosis Date  1. Hypertension  2. Depression  3. DM  4. Obesity  5. GERD  6. CAD: Unstable angina 2/08. PCI to distal codominant CFX with 2.5 x 20 Taxus DES. Lexiscan myoview (3/12): chest pain and dyspnea with stress, EF 62%, small fixed lateral perfusion defect with significant surrounding reversible perfusion defect suggestive of significant ischemia, ischemic ECG response. LHC (4/12):  80-90% mid and distal CFX stenosis before and after prior-placed stent, total occlusion of a moderate 1st OM. Patient had DES x 2 to CFX.  7. Fibromyalgia  8. Diastolic CHF: Echo (3/12) with EF 55-60%, mild LV hypertrophy, mild aortic stenosis (mean gradient 13 mmHg), mild MR, normal RV size and systolic function, PA systolic pressure 55 mmHg.  9. Aortic stenosis: Mild by echo 3/12, mean gradient 13 mmHg.  10. Diabetic retinopathy  11. H/o vertigo  12. Peripheral neuropathy likely from diabetes.  13. OHS/OSA: Severe OSA on sleep study. Patient is off CPAP currently b/c she could not tolerate the mask.   Past Surgical History  Procedure Date  . Coronary stent placement 08/2006    CAD; stent placed @ [facility]  . Total abdominal hysterectomy     Prescriptions prior to admission  Medication Sig Dispense Refill  . albuterol (PROAIR HFA) 108 (90 BASE) MCG/ACT inhaler Inhale 2 puffs into the lungs every 6 (six) hours as needed. For shortness of breath.      Marland Kitchen amLODipine (NORVASC) 5 MG tablet Take 5 mg by mouth daily.      Marland Kitchen aspirin EC 81 MG EC tablet Take 1 tablet (81 mg total) by mouth daily.      Marland Kitchen atorvastatin (LIPITOR) 40 MG tablet Take 40 mg by mouth daily.      Marland Kitchen buPROPion (WELLBUTRIN SR) 150 MG 12 hr tablet Take 150 mg by mouth 2 (two) times daily.      . carvedilol (COREG) 12.5  MG tablet Take 12.5 mg by mouth 2 (two) times daily with a meal.      . clopidogrel (PLAVIX) 75 MG tablet Take 75 mg by mouth daily.      . furosemide (LASIX) 40 MG tablet Take 40 mg by mouth 2 (two) times daily.        Marland Kitchen glipiZIDE (GLUCOTROL) 5 MG tablet Take 5 mg by mouth 2 (two) times daily before a meal.      . insulin aspart (NOVOLOG) 100 UNIT/ML injection Inject 90 Units into the skin every morning.      . insulin lispro (HUMALOG) 100 UNIT/ML injection Inject 40 Units into the skin every morning.      Marland Kitchen lisinopril (PRINIVIL,ZESTRIL) 20 MG tablet Take 40 mg by mouth at bedtime.       . meclizine (ANTIVERT)  12.5 MG tablet Take 12.5 mg by mouth 3 (three) times daily as needed. For nausea.      . metFORMIN (GLUCOPHAGE) 1000 MG tablet Take 1,000 mg by mouth 2 (two) times daily.        . Multiple Vitamin (MULTIVITAMIN) tablet Take 1 tablet by mouth daily.        . potassium chloride SA (K-DUR,KLOR-CON) 20 MEQ tablet Take 20 mEq by mouth 2 (two) times daily.      . pregabalin (LYRICA) 75 MG capsule Take 75 mg by mouth 2 (two) times daily.        Marland Kitchen rOPINIRole (REQUIP) 2 MG tablet Take 2 mg by mouth daily.        . simvastatin (ZOCOR) 40 MG tablet Take 40 mg by mouth every evening.      . traMADol (ULTRAM) 50 MG tablet Take 50-100 mg by mouth 2 (two) times daily as needed. For pain.      . traZODone (DESYREL) 50 MG tablet Take 50-100 mg by mouth at bedtime as needed. For sleep.      Marland Kitchen zolpidem (AMBIEN) 10 MG tablet Take 10 mg by mouth at bedtime as needed. For sleep.       No Known Allergies  History  Substance Use Topics  . Smoking status: Never Smoker   . Smokeless tobacco: Never Used  . Alcohol Use: No    Family History  Problem Relation Age of Onset  . Heart attack Father   . Heart failure Father   . Anemia Mother   . Hypertension Mother      Review of Systems A comprehensive review of systems was negative other than stated in HPI.  Objective: Blood pressure 106/60, pulse 68, temperature 98.7 F (37.1 C), temperature source Oral, resp. rate 20, height 5\' 5"  (1.651 m), weight 123.7 kg (272 lb 11.3 oz), SpO2 90.00%.  General Appearance:    Alert, obese, female, appears older than stated age  Head:    Normocephalic, without obvious abnormality, atraumatic  Eyes:     PERRL, EOMI, anicteric sclerae  Neck:   Supple, no carotid bruit or JVD  Lungs:     respirations unlabored, expiratory wheezing  Heart:    Regular rate and rhythm, S1 and S2 normal  Abdomen:     Soft, non-tender, normoactive bowel sounds  Extremities:   Extremities normal, atraumatic, no cyanosis or edema  Pulses:   2+ and  symmetric all extremities  Skin:   no rashes or lesions  Neurologic:   No focal deficits. AAO x3   Results for orders placed during the hospital encounter of 08/29/11 (from the past 48 hour(s))  GLUCOSE, CAPILLARY     Status: Abnormal   Collection Time   08/29/11  5:48 PM      Component Value Range Comment   Glucose-Capillary 52 (*) 70 - 99 (mg/dL)    Comment 1 Notify RN     GLUCOSE, CAPILLARY     Status: Abnormal   Collection Time   08/29/11  6:05 PM      Component Value Range Comment   Glucose-Capillary 171 (*) 70 - 99 (mg/dL)   CBC     Status: Abnormal   Collection Time   08/29/11  6:36 PM      Component Value Range Comment   WBC 8.0  4.0 - 10.5 (K/uL)    RBC 4.15  3.87 - 5.11 (MIL/uL)    Hemoglobin 11.2 (*) 12.0 - 15.0 (g/dL)    HCT 64.4  03.4 - 74.2 (%)    MCV 87.7  78.0 - 100.0 (fL)    MCH 27.0  26.0 - 34.0 (pg)    MCHC 30.8  30.0 - 36.0 (g/dL)    RDW 59.5 (*) 63.8 - 15.5 (%)    Platelets 188  150 - 400 (K/uL)   DIFFERENTIAL     Status: Normal   Collection Time   08/29/11  6:36 PM      Component Value Range Comment   Neutrophils Relative 75  43 - 77 (%)    Neutro Abs 6.0  1.7 - 7.7 (K/uL)    Lymphocytes Relative 15  12 - 46 (%)    Lymphs Abs 1.2  0.7 - 4.0 (K/uL)    Monocytes Relative 9  3 - 12 (%)    Monocytes Absolute 0.7  0.1 - 1.0 (K/uL)    Eosinophils Relative 1  0 - 5 (%)    Eosinophils Absolute 0.1  0.0 - 0.7 (K/uL)    Basophils Relative 1  0 - 1 (%)    Basophils Absolute 0.0  0.0 - 0.1 (K/uL)   BASIC METABOLIC PANEL     Status: Abnormal   Collection Time   08/29/11  6:36 PM      Component Value Range Comment   Sodium 138  135 - 145 (mEq/L)    Potassium 4.8  3.5 - 5.1 (mEq/L)    Chloride 96  96 - 112 (mEq/L)    CO2 32  19 - 32 (mEq/L)    Glucose, Bld 145 (*) 70 - 99 (mg/dL)    BUN 21  6 - 23 (mg/dL)    Creatinine, Ser 7.56 (*) 0.50 - 1.10 (mg/dL)    Calcium 43.3  8.4 - 10.5 (mg/dL)    GFR calc non Af Amer 28 (*) >90 (mL/min)    GFR calc Af Amer 33 (*) >90  (mL/min)   POCT I-STAT TROPONIN I     Status: Abnormal   Collection Time   08/29/11  6:51 PM      Component Value Range Comment   Troponin i, poc 0.15 (*) 0.00 - 0.08 (ng/mL)    Comment NOTIFIED PHYSICIAN      Comment 3            URINALYSIS, ROUTINE W REFLEX MICROSCOPIC     Status: Abnormal   Collection Time   08/29/11  7:40 PM      Component Value Range Comment   Color, Urine YELLOW  YELLOW     APPearance CLEAR  CLEAR     Specific Gravity, Urine 1.022  1.005 - 1.030     pH  5.0  5.0 - 8.0     Glucose, UA NEGATIVE  NEGATIVE (mg/dL)    Hgb urine dipstick NEGATIVE  NEGATIVE     Bilirubin Urine SMALL (*) NEGATIVE     Ketones, ur 15 (*) NEGATIVE (mg/dL)    Protein, ur NEGATIVE  NEGATIVE (mg/dL)    Urobilinogen, UA 0.2  0.0 - 1.0 (mg/dL)    Nitrite NEGATIVE  NEGATIVE     Leukocytes, UA TRACE (*) NEGATIVE    URINE MICROSCOPIC-ADD ON     Status: Abnormal   Collection Time   08/29/11  7:40 PM      Component Value Range Comment   Squamous Epithelial / LPF FEW (*) RARE     WBC, UA 0-2  <3 (WBC/hpf)    Bacteria, UA RARE  RARE     Casts HYALINE CASTS (*) NEGATIVE     Urine-Other AMORPHOUS URATES/PHOSPHATES     GLUCOSE, CAPILLARY     Status: Abnormal   Collection Time   08/29/11  9:35 PM      Component Value Range Comment   Glucose-Capillary 125 (*) 70 - 99 (mg/dL)   CARDIAC PANEL(CRET KIN+CKTOT+MB+TROPI)     Status: Abnormal   Collection Time   08/29/11  9:56 PM      Component Value Range Comment   Total CK 67  7 - 177 (U/L)    CK, MB 2.2  0.3 - 4.0 (ng/mL)    Troponin I 0.63 (*) <0.30 (ng/mL)    Relative Index RELATIVE INDEX IS INVALID  0.0 - 2.5    PRO B NATRIURETIC PEPTIDE     Status: Abnormal   Collection Time   08/29/11  9:57 PM      Component Value Range Comment   Pro B Natriuretic peptide (BNP) 4043.0 (*) 0 - 125 (pg/mL)   HEPARIN LEVEL (UNFRACTIONATED)     Status: Abnormal   Collection Time   08/30/11  2:15 AM      Component Value Range Comment   Heparin Unfractionated <0.10 (*)  0.30 - 0.70 (IU/mL)   CBC     Status: Abnormal   Collection Time   08/30/11  2:15 AM      Component Value Range Comment   WBC 7.6  4.0 - 10.5 (K/uL)    RBC 3.94  3.87 - 5.11 (MIL/uL)    Hemoglobin 10.6 (*) 12.0 - 15.0 (g/dL)    HCT 16.1 (*) 09.6 - 46.0 (%)    MCV 88.1  78.0 - 100.0 (fL)    MCH 26.9  26.0 - 34.0 (pg)    MCHC 30.5  30.0 - 36.0 (g/dL)    RDW 04.5 (*) 40.9 - 15.5 (%)    Platelets 199  150 - 400 (K/uL)    Dg Chest Port 1 View  08/29/2011  *RADIOLOGY REPORT*  Clinical Data: Nausea, vomiting, weakness  PORTABLE CHEST - 1 VIEW  Comparison: 04/20/2011  Findings: Stable cardiomegaly.  Perihilar and bibasilar interstitial and airspace edema or infiltrates   slightly increased since previous study.  No definite effusion.  Regional bones unremarkable.  IMPRESSION:  1.  Stable cardiomegaly with some increase in bilateral edema/infiltrates.  Original Report Authenticated By: Thora Lance III, M.D.    ECG:  Sinus rhythm HR 74, ST-T wave abnormality consider lateral ischemia, slightly more depression than prior, but not significantly changed  Impression: Nonspecific troponin elevation in setting of hypoglycemic episode with unresponsiveness, along with Acute on chronic renal insufficiency  Recommendations/Plan: --Supportive care of primary medical problems,  probably upper respiratory infection and hypoglycemia due to poor po intake --Would stop heparin gtt --Ecg prn chest pain or rhythm changes --May trend biomarkers to peak, however, not likely ACS --Continue ASA/Plavix, Statin, BB --Hold ACEI as elevated Cr --Monitor strict I/Os, daily weights, restart Lasix when Cr stablizes --Diabetes management as per primary team --Further recommendations pending clinical course and follow up assessment.

## 2011-08-29 NOTE — ED Notes (Signed)
MD at bedside. 

## 2011-08-29 NOTE — ED Notes (Signed)
Attempted to call report.  [phone] RN will call back in .

## 2011-08-29 NOTE — ED Notes (Signed)
Glucose check 52, MD Caporossi notified.

## 2011-08-29 NOTE — H&P (Addendum)
PCP:   [name], NP, NP   Chief Complaint:  Passed out  HPI: 66 year old female was brought to the hospital, after patient passed out at home. Patient took insulin at home, after that she developed hypoglycemia and passed out. As per family patient was unresponsive with shaking of the body. The family was trying to feed the patient, while she lay on the floor. Patient was brought to the hospital and was given an amp of D50. At this time patient is alert oriented x3 denies any chest pain or shortness of breath. EKG in the hospital shows ST depression in leads 1 V4 V5 V6 and also has mild elevation of troponin 0.15. Cardiology  consultation with the [facility] cardiology has been obtained. Patient is currently on IV heparin. As the family patient was given antibiotics 2 days ago when she was diagnosed with bronchitis, this persist or fever and dry cough.  Allergies:  No Known Allergies    Past Medical History  Diagnosis Date  . Hypertension   . Depression   . Diabetes mellitus   . GERD (gastroesophageal reflux disease)   . Obesity   . Coronary artery disease 08/2006    Unstable angina. PCI to distal codominant CFX with 2.5 x 20 Taxus DES...    Past Surgical History  Procedure Date  . Coronary stent placement 08/2006    CAD; stent placed @ [facility]  . Total abdominal hysterectomy     Prior to Admission medications   Medication Sig Start Date End Date Taking? Authorizing Provider  albuterol (PROAIR HFA) 108 (90 BASE) MCG/ACT inhaler Inhale 2 puffs into the lungs every 6 (six) hours as needed. For shortness of breath.   Yes Historical Provider, MD  amLODipine (NORVASC) 5 MG tablet Take 5 mg by mouth daily.   Yes Historical Provider, MD  aspirin EC 81 MG EC tablet Take 1 tablet (81 mg total) by mouth daily. 11/09/10  Yes [name], MD  atorvastatin (LIPITOR) 40 MG tablet Take 40 mg by mouth daily.   Yes Historical Provider, MD  buPROPion (WELLBUTRIN SR) 150 MG 12 hr tablet Take 150 mg  by mouth 2 (two) times daily.   Yes Historical Provider, MD  carvedilol (COREG) 12.5 MG tablet Take 12.5 mg by mouth 2 (two) times daily with a meal.   Yes Historical Provider, MD  clopidogrel (PLAVIX) 75 MG tablet Take 75 mg by mouth daily. 05/20/11  Yes [name], MD  furosemide (LASIX) 40 MG tablet Take 40 mg by mouth 2 (two) times daily.   04/20/11  Yes [name], MD  glipiZIDE (GLUCOTROL) 5 MG tablet Take 5 mg by mouth 2 (two) times daily before a meal.   Yes Historical Provider, MD  insulin aspart (NOVOLOG) 100 UNIT/ML injection Inject 90 Units into the skin every morning.   Yes Historical Provider, MD  insulin lispro (HUMALOG) 100 UNIT/ML injection Inject 40 Units into the skin every morning.   Yes Historical Provider, MD  lisinopril (PRINIVIL,ZESTRIL) 20 MG tablet Take 40 mg by mouth at bedtime.    Yes Historical Provider, MD  meclizine (ANTIVERT) 12.5 MG tablet Take 12.5 mg by mouth 3 (three) times daily as needed. For nausea.   Yes Historical Provider, MD  metFORMIN (GLUCOPHAGE) 1000 MG tablet Take 1,000 mg by mouth 2 (two) times daily.     Yes Historical Provider, MD  Multiple Vitamin (MULTIVITAMIN) tablet Take 1 tablet by mouth daily.     Yes Historical Provider, MD  potassium chloride SA (  K-DUR,KLOR-CON) 20 MEQ tablet Take 20 mEq by mouth 2 (two) times daily. 04/20/11 04/19/12 Yes [name], MD  pregabalin (LYRICA) 75 MG capsule Take 75 mg by mouth 2 (two) times daily.     Yes Historical Provider, MD  rOPINIRole (REQUIP) 2 MG tablet Take 2 mg by mouth daily.     Yes Historical Provider, MD  simvastatin (ZOCOR) 40 MG tablet Take 40 mg by mouth every evening.   Yes Historical Provider, MD  traMADol (ULTRAM) 50 MG tablet Take 50-100 mg by mouth 2 (two) times daily as needed. For pain.   Yes Historical Provider, MD  traZODone (DESYREL) 50 MG tablet Take 50-100 mg by mouth at bedtime as needed. For sleep.   Yes Historical Provider, MD  zolpidem (AMBIEN) 10 MG tablet Take 10 mg by  mouth at bedtime as needed. For sleep.   Yes Historical Provider, MD    Social History:  reports that she has never smoked. She has never used smokeless tobacco. She reports that she does not drink alcohol or use illicit drugs.  Family History  Problem Relation Age of Onset  . Heart attack Father   . Heart failure Father   . Anemia Mother   . Hypertension Mother     Review of Systems:  HEENT: Denies headache, positive blurred vision, no runny nose, sore throat,  Neck: Denies thyroid problems,lymphadenopathy Chest : Denies shortness of breath, no history of COPD Heart : Patient has history of coronary arterey disease, status post stent GI: Positive one episode of vomiting, no diarrhea, constipation GU: Denies dysuria, urgency, frequency of urination, hematuria Neuro: Denies stroke, seizures, syncope    Physical Exam: Blood pressure 103/42, pulse 71, temperature 99.4 F (37.4 C), temperature source Oral, resp. rate 20, SpO2 94.00%. Constitutional:   Patient is a well-developed and well-nourished female in no acute distress and cooperative with exam. Head: Normocephalic and atraumatic Mouth: Mucus membranes moist Eyes: PERRL, EOMI, conjunctivae normal Neck: Supple, No Thyromegaly Cardiovascular: RRR, S1 normal, S2 normal Pulmonary/Chest: Bilateral scattered wheezes Abdominal: Soft. Non-tender, non-distended, bowel sounds are normal, no masses, organomegaly, or guarding present.  Neurological: A&O x3, Strenght is normal and symmetric bilaterally, cranial nerve II-XII are grossly intact, no focal motor deficit, sensory intact to light touch bilaterally.  Extremities : No Cyanosis, Clubbing or Edema   Labs on Admission:  Results for orders placed during the hospital encounter of 08/29/11 (from the past 48 hour(s))  GLUCOSE, CAPILLARY     Status: Abnormal   Collection Time   08/29/11  5:48 PM      Component Value Range Comment   Glucose-Capillary 52 (*) 70 - 99 (mg/dL)     Comment 1 Notify RN     GLUCOSE, CAPILLARY     Status: Abnormal   Collection Time   08/29/11  6:05 PM      Component Value Range Comment   Glucose-Capillary 171 (*) 70 - 99 (mg/dL)   CBC     Status: Abnormal   Collection Time   08/29/11  6:36 PM      Component Value Range Comment   WBC 8.0  4.0 - 10.5 (K/uL)    RBC 4.15  3.87 - 5.11 (MIL/uL)    Hemoglobin 11.2 (*) 12.0 - 15.0 (g/dL)    HCT 16.1  09.6 - 04.5 (%)    MCV 87.7  78.0 - 100.0 (fL)    MCH 27.0  26.0 - 34.0 (pg)    MCHC 30.8  30.0 - 36.0 (g/dL)  RDW 16.7 (*) 11.5 - 15.5 (%)    Platelets 188  150 - 400 (K/uL)   DIFFERENTIAL     Status: Normal   Collection Time   08/29/11  6:36 PM      Component Value Range Comment   Neutrophils Relative 75  43 - 77 (%)    Neutro Abs 6.0  1.7 - 7.7 (K/uL)    Lymphocytes Relative 15  12 - 46 (%)    Lymphs Abs 1.2  0.7 - 4.0 (K/uL)    Monocytes Relative 9  3 - 12 (%)    Monocytes Absolute 0.7  0.1 - 1.0 (K/uL)    Eosinophils Relative 1  0 - 5 (%)    Eosinophils Absolute 0.1  0.0 - 0.7 (K/uL)    Basophils Relative 1  0 - 1 (%)    Basophils Absolute 0.0  0.0 - 0.1 (K/uL)   BASIC METABOLIC PANEL     Status: Abnormal   Collection Time   08/29/11  6:36 PM      Component Value Range Comment   Sodium 138  135 - 145 (mEq/L)    Potassium 4.8  3.5 - 5.1 (mEq/L)    Chloride 96  96 - 112 (mEq/L)    CO2 32  19 - 32 (mEq/L)    Glucose, Bld 145 (*) 70 - 99 (mg/dL)    BUN 21  6 - 23 (mg/dL)    Creatinine, Ser 1.61 (*) 0.50 - 1.10 (mg/dL)    Calcium 09.6  8.4 - 10.5 (mg/dL)    GFR calc non Af Amer 28 (*) >90 (mL/min)    GFR calc Af Amer 33 (*) >90 (mL/min)   POCT I-STAT TROPONIN I     Status: Abnormal   Collection Time   08/29/11  6:51 PM      Component Value Range Comment   Troponin i, poc 0.15 (*) 0.00 - 0.08 (ng/mL)    Comment NOTIFIED PHYSICIAN      Comment 3            URINALYSIS, ROUTINE W REFLEX MICROSCOPIC     Status: Abnormal   Collection Time   08/29/11  7:40 PM      Component Value Range  Comment   Color, Urine YELLOW  YELLOW     APPearance CLEAR  CLEAR     Specific Gravity, Urine 1.022  1.005 - 1.030     pH 5.0  5.0 - 8.0     Glucose, UA NEGATIVE  NEGATIVE (mg/dL)    Hgb urine dipstick NEGATIVE  NEGATIVE     Bilirubin Urine SMALL (*) NEGATIVE     Ketones, ur 15 (*) NEGATIVE (mg/dL)    Protein, ur NEGATIVE  NEGATIVE (mg/dL)    Urobilinogen, UA 0.2  0.0 - 1.0 (mg/dL)    Nitrite NEGATIVE  NEGATIVE     Leukocytes, UA TRACE (*) NEGATIVE    URINE MICROSCOPIC-ADD ON     Status: Abnormal   Collection Time   08/29/11  7:40 PM      Component Value Range Comment   Squamous Epithelial / LPF FEW (*) RARE     WBC, UA 0-2  <3 (WBC/hpf)    Bacteria, UA RARE  RARE     Casts HYALINE CASTS (*) NEGATIVE     Urine-Other AMORPHOUS URATES/PHOSPHATES       Radiological Exams on Admission: Dg Chest Port 1 View  08/29/2011  *RADIOLOGY REPORT*  Clinical Data: Nausea, vomiting, weakness  PORTABLE CHEST -  1 VIEW  Comparison: 04/20/2011  Findings: Stable cardiomegaly.  Perihilar and bibasilar interstitial and airspace edema or infiltrates   slightly increased since previous study.  No definite effusion.  Regional bones unremarkable.  IMPRESSION:  1.  Stable cardiomegaly with some increase in bilateral edema/infiltrates.  Original Report Authenticated By: Osa Craver, M.D.    Assessment/Plan  Acute coronary syndrome Patient has been started on aspirin we'll continue her Plavix and heparin. Cardiology consultation with [facility] cardiology has obtained, and Dr. Myrtis Ser is coming to see the patient. We'll monitor the patient on telemetry .  Pulmonary edema Patient chest x-ray shows stable cardiomegaly with some increase in bilateral edema/infiltrates, will continue the Lasix at home dose. Due to hypotension, would refrain from giving IV Lasix at this time.  Diabetes mellitus We'll start on Lantus 20 units subcutaneous daily along with sliding scale insulin  Hyperlipidemia Continue  Zocor   Time Spent on Admission: 60 min  Cledith Kamiya S [facility] Pager: [phone] 08/29/2011, 8:46 PM

## 2011-08-29 NOTE — ED Notes (Signed)
Glucose check 171

## 2011-08-29 NOTE — ED Notes (Signed)
ED tech at bedside for glucose check and EKG

## 2011-08-29 NOTE — Progress Notes (Signed)
Pt has a critical lab value.  Pt resting comfortably. Troponin elevated 0.63 MD paged at [phone]. MD returned page. No new orders received.

## 2011-08-29 NOTE — Progress Notes (Signed)
ANTICOAGULATION CONSULT NOTE - Initial Consult  Pharmacy Consult for heparin Indication: chest pain/ACS  No Known Allergies  Vital Signs: Temp: 100.1 F (37.8 C) (02/04 2049) Temp src: Oral (02/04 2049) BP: 92/40 mmHg (02/04 2100) Pulse Rate: 70  (02/04 2100)  Labs:  Basename 08/29/11 1836  HGB 11.2*  HCT 36.4  PLT 188  APTT --  LABPROT --  INR --  HEPARINUNFRC --  CREATININE 1.84*  CKTOTAL --  CKMB --  TROPONINI --   The CrCl is unknown because both a height and weight (above a minimum accepted value) are required for this calculation.  Medical History: Past Medical History  Diagnosis Date  . Hypertension   . Depression   . Diabetes mellitus   . GERD (gastroesophageal reflux disease)   . Obesity   . Coronary artery disease 08/2006    Unstable angina. PCI to distal codominant CFX with 2.5 x 20 Taxus DES...    Assessment: 65 yof to start IV heparin for ACS with mildly elevated troponins. Pt denies CP. Heparin started earlier today per MD and dose is appropriate. Pharmacy will take over management to order appropriate labs and adjust doses as appropriate. Received 4000unit bolus per MD and started on gtt at 1000units/hr  Goal of Therapy:  Heparin level 0.3-0.7 units/ml   Plan:  1. Continue heparin gtt at 1000units/hr 2. Check a heparin level at 0200 3. Daily heparin level and CBC  Xadrian Craighead, Drake Leach 08/29/2011,9:19 PM

## 2011-08-30 ENCOUNTER — Inpatient Hospital Stay (HOSPITAL_COMMUNITY): Payer: Medicare Other

## 2011-08-30 DIAGNOSIS — J9620 Acute and chronic respiratory failure, unspecified whether with hypoxia or hypercapnia: Secondary | ICD-10-CM

## 2011-08-30 DIAGNOSIS — J9801 Acute bronchospasm: Secondary | ICD-10-CM

## 2011-08-30 DIAGNOSIS — I509 Heart failure, unspecified: Secondary | ICD-10-CM

## 2011-08-30 DIAGNOSIS — R0602 Shortness of breath: Secondary | ICD-10-CM

## 2011-08-30 DIAGNOSIS — I359 Nonrheumatic aortic valve disorder, unspecified: Secondary | ICD-10-CM

## 2011-08-30 DIAGNOSIS — R0609 Other forms of dyspnea: Secondary | ICD-10-CM

## 2011-08-30 DIAGNOSIS — I5032 Chronic diastolic (congestive) heart failure: Secondary | ICD-10-CM

## 2011-08-30 DIAGNOSIS — R0989 Other specified symptoms and signs involving the circulatory and respiratory systems: Secondary | ICD-10-CM

## 2011-08-30 LAB — CBC
HCT: 34.7 % — ABNORMAL LOW (ref 36.0–46.0)
Hemoglobin: 10.2 g/dL — ABNORMAL LOW (ref 12.0–15.0)
MCHC: 30.5 g/dL (ref 30.0–36.0)
MCHC: 30.4 g/dL (ref 30.0–36.0)
MCV: 88.1 fL (ref 78.0–100.0)
Platelets: 184 10*3/uL (ref 150–400)
RDW: 16.7 % — ABNORMAL HIGH (ref 11.5–15.5)
RDW: 17 % — ABNORMAL HIGH (ref 11.5–15.5)
WBC: 7.6 10*3/uL (ref 4.0–10.5)

## 2011-08-30 LAB — CARDIAC PANEL(CRET KIN+CKTOT+MB+TROPI)
CK, MB: 2.3 ng/mL (ref 0.3–4.0)
CK, MB: 2 ng/mL (ref 0.3–4.0)
Relative Index: INVALID (ref 0.0–2.5)
Relative Index: 1.8 (ref 0.0–2.5)
Total CK: 84 U/L (ref 7–177)
Total CK: 112 U/L (ref 7–177)
Troponin I: 0.45 ng/mL (ref <0.30)

## 2011-08-30 LAB — BLOOD GAS, ARTERIAL
Bicarbonate: 31.1 mEq/L — ABNORMAL HIGH (ref 20.0–24.0)
O2 Saturation: 87.6 %
pO2, Arterial: 55.5 mmHg — ABNORMAL LOW (ref 80.0–100.0)

## 2011-08-30 LAB — COMPREHENSIVE METABOLIC PANEL
GFR calc Af Amer: 26 mL/min — ABNORMAL LOW (ref >90)
GFR calc non Af Amer: 22 mL/min — ABNORMAL LOW (ref >90)
Potassium: 4.8 mEq/L (ref 3.5–5.1)
Sodium: 133 mEq/L — ABNORMAL LOW (ref 135–145)
Total Bilirubin: 0.2 mg/dL — ABNORMAL LOW (ref 0.3–1.2)

## 2011-08-30 LAB — GLUCOSE, CAPILLARY: Glucose-Capillary: 179 mg/dL — ABNORMAL HIGH (ref 70–99)

## 2011-08-30 MED ORDER — BISOPROLOL FUMARATE 5 MG PO TABS
5.0000 mg | ORAL_TABLET | Freq: Every day | ORAL | Status: DC
  Administered 2011-08-30 – 2011-09-05 (×6): 5 mg via ORAL
  Filled 2011-08-30 (×8): qty 1

## 2011-08-30 MED ORDER — HEPARIN BOLUS VIA INFUSION
2500.0000 [IU] | Freq: Once | INTRAVENOUS | Status: AC
  Administered 2011-08-30: 2500 [IU] via INTRAVENOUS
  Filled 2011-08-30: qty 2500

## 2011-08-30 MED ORDER — ALBUTEROL SULFATE (5 MG/ML) 0.5% IN NEBU
2.5000 mg | INHALATION_SOLUTION | Freq: Four times a day (QID) | RESPIRATORY_TRACT | Status: DC
  Administered 2011-08-30 – 2011-09-01 (×11): 2.5 mg via RESPIRATORY_TRACT
  Filled 2011-08-30 (×11): qty 0.5

## 2011-08-30 MED ORDER — ACETAMINOPHEN 325 MG PO TABS
650.0000 mg | ORAL_TABLET | Freq: Four times a day (QID) | ORAL | Status: DC | PRN
  Administered 2011-08-30 – 2011-09-05 (×7): 650 mg via ORAL
  Filled 2011-08-30 (×7): qty 2

## 2011-08-30 MED ORDER — IPRATROPIUM BROMIDE 0.02 % IN SOLN
0.5000 mg | Freq: Four times a day (QID) | RESPIRATORY_TRACT | Status: DC
  Administered 2011-08-30 – 2011-09-01 (×11): 0.5 mg via RESPIRATORY_TRACT
  Filled 2011-08-30 (×10): qty 2.5

## 2011-08-30 MED ORDER — DEXTROSE 5 % IV SOLN
1.0000 g | INTRAVENOUS | Status: DC
  Administered 2011-08-30 – 2011-08-31 (×2): 1 g via INTRAVENOUS
  Filled 2011-08-30 (×2): qty 10

## 2011-08-30 MED ORDER — BUDESONIDE 0.25 MG/2ML IN SUSP
0.2500 mg | Freq: Four times a day (QID) | RESPIRATORY_TRACT | Status: DC
  Administered 2011-08-30 – 2011-08-31 (×4): 0.25 mg via RESPIRATORY_TRACT
  Filled 2011-08-30 (×8): qty 2

## 2011-08-30 MED ORDER — DEXTROSE 5 % IV SOLN
500.0000 mg | INTRAVENOUS | Status: AC
  Administered 2011-08-30 – 2011-09-04 (×6): 500 mg via INTRAVENOUS
  Filled 2011-08-30 (×7): qty 500

## 2011-08-30 MED ORDER — ACETAMINOPHEN 650 MG RE SUPP: 650.0000 mg | Freq: Four times a day (QID) | RECTAL | Status: DC | PRN

## 2011-08-30 NOTE — Progress Notes (Signed)
Utilization review completed.  

## 2011-08-30 NOTE — Consult Note (Signed)
PULMONARY/CCM CONSULT NOTE  Requesting MD/Service: Derry Skill Date of admission:  2/4 Date of consult: 2/5 Reason for consultation: hypercarbic resp failure  Pt Profile:  68 yowf with PMH of OSA/OHS seen by Dr Shelle Iron in Oct 2012 and intolerant to CPAP. Admitted by [facility] after hypoglycemic syncopal episode. Hosp day one found to be more dyspneic and lethargic with audible wheezes. ABG 7.31/63/56 on 2lpm [place]. Transferred to SDU and Pulm med asked to assist in mgmt. She has been intolerant to NPPV     HPI: 66 year old female was brought to the hospital, after patient passed out at home. Patient took insulin at home, after that she developed hypoglycemia and passed out. As per family patient was unresponsive with shaking of the body. The family was trying to feed the patient, while she lay on the floor. Patient was brought to the hospital and was given an amp of D50. At this time patient is alert oriented x3 denies any chest pain or shortness of breath. EKG in the hospital shows ST depression in leads 1 V4 V5 V6 and also has mild elevation of troponin 0.15. Cardiology consultation with the [facility] cardiology has been obtained. Patient is currently on IV heparin. As the family patient was given antibiotics 2 days ago when she was diagnosed with bronchitis, this persist or fever and dry cough.   Past Medical History  Diagnosis Date  . Hypertension   . Depression   . Diabetes mellitus   . GERD (gastroesophageal reflux disease)   . Obesity   . Coronary artery disease 08/2006    Unstable angina. PCI to distal codominant CFX with 2.5 x 20 Taxus DES...  [name] on chronic home O2 without a specific dx that should require such. No prior smoking hx. Possible hx of asthma as child.   MEDICATIONS: reviewed  History   Social History  . Marital Status: Married    Spouse Name: Sarha Bartelt    Number of Children: N/A  . Years of Education: N/A   Occupational History  . 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 Smoking status: Never Smoker   . Smokeless tobacco: Never Used  . Alcohol Use: No  . Drug Use: No  . Sexually Active: Not on file   Other Topics Concern  . Not on file   Social History Narrative   Married, [place]: diabetes and fibromyalgiaNo regular exercise    Family History  Problem Relation Age of Onset  . Heart attack Father   . Heart failure Father   . Anemia Mother   . Hypertension Mother     ROS - difficult to obtain due to lethargy  Filed Vitals:   08/30/11 0907 08/30/11 0940 08/30/11 1029 08/30/11 1130  BP: 126/67 114/55 131/50 122/83  Pulse: 69 71 70 69  Temp: 100.9 F (38.3 C)  98.4 F (36.9 C) 99.4 F (37.4 C)  TempSrc: Oral  Oral Oral  Resp: 36 28 20 20   Height:   5\' 5"  (1.651 m)   Weight:   123.2 kg (271 lb 9.7 oz)   SpO2: 99% 87% 87% 90%    EXAM:  Gen: obese, audible wheezes noted, mild increased WOB, lethargic but oriented HEENT: WNL Neck: JVP cannot be visualized Lungs: Moderately diminished throughout. Scattered wheezes. No findings of consolidation Cardiovascular:  Distant HS, no murmurs noted Abdomen:  Obese, soft, NT, NABS Musculoskeletal:  Trace BLE edema, symmetric Neuro:  Tremulous, no focal deficits Skin:  intact  DATA: CXR; CM, vasc congestion, minimal interstitial prominence  BMET    Component Value Date/Time   NA 133* 08/30/2011 1116   K 4.8 08/30/2011 1116   CL 94* 08/30/2011 1116   CO2 30 08/30/2011 1116   GLUCOSE 187* 08/30/2011 1116   BUN 25* 08/30/2011 1116   CREATININE 2.22* 08/30/2011 1116   CALCIUM 9.8 08/30/2011 1116   GFRNONAA 22* 08/30/2011 1116   GFRAA 26* 08/30/2011 1116    CBC    Component Value Date/Time   WBC 7.1 08/30/2011 1116   RBC 3.81* 08/30/2011 1116   HGB 10.2* 08/30/2011 1116   HCT 33.6* 08/30/2011 1116   PLT 184 08/30/2011 1116   MCV 88.2 08/30/2011 1116   MCH 26.8 08/30/2011 1116   MCHC 30.4 08/30/2011 1116   RDW 17.0* 08/30/2011 1116    LYMPHSABS 1.2 08/29/2011 1836   MONOABS 0.7 08/29/2011 1836   EOSABS 0.1 08/29/2011 1836   BASOSABS 0.0 08/29/2011 1836      IMPRESSION/PLAN:   Acute hypercarbic resp failure - by ABG, it appears that she might have very mild baseline hypercarbia (likely on the basis of OHS). Suspect decompensation of same with perhaps component of acute bronchospasm (vs VCD). There appears to be a component of impaired ventilatory drive as well  -Agree with transfer to SDU -Since she is intolerant to NPPV, if she deteriorates further, we will likely need to consider intubation -Optimize nebulized steroids and bronchodilators - will avoid systemic steroids for now due to DM -Careful O2 titration maintaining SpO2 88-93%. No higher, no lower -Doubt bacterial PNA but will cont current abx for now. Check PCT in AM and consider stopping or narrowing coverage -There is little utility to repeated ABGs if we monitor her clinical status carefully. What matters is that her cognition is OK and that WOB and oxygenation are acceptable -Check TSH as hypothyroidism is a rare but imminently treatable cause of hypercapneic resp failure  CCM X 30 mins  Billy Fischer, MD;  PCCM service; Mobile [phone]

## 2011-08-30 NOTE — Progress Notes (Addendum)
Patient ID: Krista Wise, female   DOB: Aug 03, 1945, 66 y.o.   MRN: [id number]    SUBJECTIVE: More dyspneic this am, ABG with hypercapnea, now on Bipap.  Wheezing audibly this am.  Sedated.      Marland Kitchen albuterol  2.5 mg Nebulization Q6H  . antiseptic oral rinse  15 mL Mouth Rinse BID  . aspirin  324 mg Oral Once  . aspirin EC  81 mg Oral Daily  . azithromycin  500 mg Intravenous Q24H  . carvedilol  12.5 mg Oral BID WC  . cefTRIAXone (ROCEPHIN)  IV  1 g Intravenous Q24H  . clopidogrel  75 mg Oral Q breakfast  . dextrose      . furosemide  40 mg Oral BID  . heparin  2,500 Units Intravenous Once  . heparin  4,000 Units Intravenous Once  . insulin aspart  0-15 Units Subcutaneous TID WC  . ipratropium  0.5 mg Nebulization Q6H  . ondansetron  4 mg Intravenous Once  . potassium chloride SA  20 mEq Oral BID  . rosuvastatin  20 mg Oral Daily  . sodium chloride  3 mL Intravenous Q12H  . DISCONTD: buPROPion  150 mg Oral BID  . DISCONTD: furosemide  40 mg Oral BID  . DISCONTD: insulin glargine  20 Units Subcutaneous QHS  . DISCONTD: pregabalin  75 mg Oral BID  . DISCONTD: rOPINIRole  2 mg Oral Daily  . DISCONTD: rOPINIRole  2 mg Oral QHS      Filed Vitals:   08/30/11 0500 08/30/11 0853 08/30/11 0905 08/30/11 0907  BP: 118/75  126/64 126/67  Pulse: 71  69 69  Temp: 98.7 F (37.1 C)  100.9 F (38.3 C) 100.9 F (38.3 C)  TempSrc: Oral  Oral Oral  Resp: 20  36 36  Height:      Weight:      SpO2: 90% 94% 99% 99%    Intake/Output Summary (Last 24 hours) at 08/30/11 6295 Last data filed at 08/30/11 0351  Gross per 24 hour  Intake  83.58 ml  Output      0 ml  Net  83.58 ml    LABS: Basic Metabolic Panel:  Basename 08/29/11 1836  NA 138  K 4.8  CL 96  CO2 32  GLUCOSE 145*  BUN 21  CREATININE 1.84*  CALCIUM 10.4  MG --  PHOS --   Liver Function Tests: No results found for this basename: AST:2,ALT:2,ALKPHOS:2,BILITOT:2,PROT:2,ALBUMIN:2 in the last 72 hours No results  found for this basename: LIPASE:2,AMYLASE:2 in the last 72 hours CBC:  Basename 08/30/11 0215 08/29/11 1836  WBC 7.6 8.0  NEUTROABS -- 6.0  HGB 10.6* 11.2*  HCT 34.7* 36.4  MCV 88.1 87.7  PLT 199 188   Cardiac Enzymes:  Basename 08/30/11 0545 08/29/11 2156  CKTOTAL 84 67  CKMB 2.3 2.2  CKMBINDEX -- --  TROPONINI 0.60* 0.63*   BNP: No components found with this basename: POCBNP:3 D-Dimer: No results found for this basename: DDIMER:2 in the last 72 hours Hemoglobin A1C: No results found for this basename: HGBA1C in the last 72 hours Fasting Lipid Panel: No results found for this basename: CHOL,HDL,LDLCALC,TRIG,CHOLHDL,LDLDIRECT in the last 72 hours Thyroid Function Tests: No results found for this basename: TSH,T4TOTAL,FREET3,T3FREE,THYROIDAB in the last 72 hours Anemia Panel: No results found for this basename: VITAMINB12,FOLATE,FERRITIN,TIBC,IRON,RETICCTPCT in the last 72 hours  RADIOLOGY: Dg Chest Port 1 View  08/29/2011  *RADIOLOGY REPORT*  Clinical Data: Nausea, vomiting, weakness  PORTABLE CHEST - 1  VIEW  Comparison: 04/20/2011  Findings: Stable cardiomegaly.  Perihilar and bibasilar interstitial and airspace edema or infiltrates   slightly increased since previous study.  No definite effusion.  Regional bones unremarkable.  IMPRESSION:  1.  Stable cardiomegaly with some increase in bilateral edema/infiltrates.  Original Report Authenticated By: Osa Craver, M.D.    PHYSICAL EXAM General: Bipap, mild tachypnea Neck: Thick, difficult to estimate JVP, no thyromegaly or thyroid nodule.  Lungs: Bilateral expiratory wheezes CV: Nondisplaced PMI.  Heart regular S1/S2, no S3/S4, no murmur.  1+ bilateral ankle edema.  No carotid bruit.  Abdomen: Soft, nontender, no hepatosplenomegaly, no distention.  Neurologic: Sedated, oriented to place Psych: Normal affect. Extremities: No clubbing or cyanosis.   ASSESSMENT AND PLAN:  66 yo with history of CAD, diastolic CHF,  OHS and OSA admitted with dyspnea/wheezing.  This morning she has a temperature to 100.9 and is wheezing and hypercapneic.   1. Pulmonary: Hypercarbic respiratory failure.  She does not appear particularly volume overloaded compared to my last visit with her.  At baseline, she wears home O2 during the day for OHS and has OSA but did not tolerate CPAP.  She has bilateral wheezing.  I suspect that she has a respiratory infection (bronchitis versus PNA) with decompensation of her baseline poor respiratory status.  [name] febrile this morning.  - Agree with Bipap for now.  - Cover with antibiotics for PNA.   - Treat with albuterol/atrovent nebs - Pulmonary to see.  2. CAD: h/o PCI to CFX.  Continue ASA, Plavix, statin.  Would change beta blocker to bisoprolol with wheezing and lower the dose.  TnI mildly elevated with normal CKMB in the setting of severe hypoglycemia with syncope as well as respirtory failure.  No chest pain. I am inclined to think that this is demand ischemia.  Would continue to trend cardiac enzymes.  If no further rise, probable functional study when stable in the future.   3. CHF: Has significant diastolic CHF, on Lasix at home.  Some pulmonary vascular congestion on CXR.  I do not think that this is the root cause of her current decompensation.  Volume status is difficult to ascertain.  Creatinine is up from baseline. - Check BNP - Continue home po Lasix regimen for now.   [name] 08/30/2011 9:30 AM

## 2011-08-30 NOTE — Significant Event (Signed)
Rapid Response Event Note  Overview: Time Called: 0853 Arrival Time: 0953 Event Type: Respiratory  Initial Focused Assessment: Increased PCO2 decreased Sats & PO2  Interventions: Neb treatment, increased O2 from 2L to 4L, placed on Bipap, tx to SDU  Event Summary: Consulted by Dr. Carmell Austria to see pt for increased lethargy and increased PCO2 on ABG.  Pt's Sats were low on 2L so increased to 4L. Neb tx given, good mouth care given, & pt placed on Bipap.  Plan to tx pt to SDU.  Dr. Shirlee Latch & Dr. Bard Herbert at bedside.  PCXR pending.  See Doc flowsheets for VS. Name of Physician Notified: DR. Carmell Austria at [phone]    at    Outcome: Transferred (Comment) Transfererred to 2605. Report called to [facility] @0958 .    Krista Wise

## 2011-08-30 NOTE — Progress Notes (Signed)
ANTICOAGULATION CONSULT NOTE - Follow-Up Consult  Pharmacy Consult for heparin Indication: chest pain/ACS  No Known Allergies  Ht: 65 inches Wt: 123.7 kg IBW: 57 kg Heparin dosing weight: 87 kg  Vital Signs: Temp: 98.7 F (37.1 C) (02/04 2132) Temp src: Oral (02/04 2132) BP: 106/60 mmHg (02/04 2132) Pulse Rate: 68  (02/04 2132)  Labs:  Basename 08/30/11 0215 08/29/11 2156 08/29/11 1836  HGB 10.6* -- 11.2*  HCT 34.7* -- 36.4  PLT 199 -- 188  APTT -- -- --  LABPROT -- -- --  INR -- -- --  HEPARINUNFRC <0.10* -- --  CREATININE -- -- 1.84*  CKTOTAL -- 67 --  CKMB -- 2.2 --  TROPONINI -- 0.63* --   Estimated Creatinine Clearance: 41.4 ml/min (by C-G formula based on Cr of 1.84).  Assessment: Heparin level undetectable on 1000 units/hr. No bleeding noted. No issues with heparin line per RN.  Goal of Therapy:  Heparin level 0.3-0.7 units/ml   Plan:  1. Rebolus heparin 2500 units now 2. Increase heparin gtt to 1250 units/hr 3. Check heparin level in 6 hours  Krista Wise, Hilario Quarry, PharmD, BCPS Clinical pharmacist, pager [phone] 08/30/2011,3:40 AM

## 2011-08-30 NOTE — Progress Notes (Signed)
Subjective: Patient with diffuse wheezes, SOB,  more sleepy today, CBG 150.  Objective: Filed Vitals:   08/29/11 2132 08/30/11 0500 08/30/11 0853 08/30/11 0905  BP: 106/60 118/75  126/64  Pulse: 68 71  69  Temp: 98.7 F (37.1 C) 98.7 F (37.1 C)  100.9 F (38.3 C)  TempSrc: Oral Oral  Oral  Resp: 20 20  36  Height: 5\' 5"  (1.651 m)     Weight: 123.7 kg (272 lb 11.3 oz)     SpO2: 90% 90% 94% 99%   Weight change:   Intake/Output Summary (Last 24 hours) at 08/30/11 0907 Last data filed at 08/30/11 0351  Gross per 24 hour  Intake  83.58 ml  Output      0 ml  Net  83.58 ml    General: Sleepy, wakes up at command,  HEENT: No bruits, no goiter.  Heart: Regular rate and rhythm, without murmurs, rubs, gallops.  Lungs: Diffuse ,wheezes,  bilateral air movement.  Abdomen: Soft, nontender, nondistended, positive bowel sounds.  Extremities; no edema.    Lab Results:  [facility] 08/29/11 1836  NA 138  K 4.8  CL 96  CO2 32  GLUCOSE 145*  BUN 21  CREATININE 1.84*  CALCIUM 10.4  MG --  PHOS --    Basename 08/30/11 0215 08/29/11 1836  WBC 7.6 8.0  NEUTROABS -- 6.0  HGB 10.6* 11.2*  HCT 34.7* 36.4  MCV 88.1 87.7  PLT 199 188    Basename 08/30/11 0545 08/29/11 2156  CKTOTAL 84 67  CKMB 2.3 2.2  CKMBINDEX -- --  TROPONINI 0.60* 0.63*   Micro Results: No results found for this or any previous visit (from the past 240 hour(s)).  Studies/Results: Dg Chest Port 1 View  08/29/2011  *RADIOLOGY REPORT*  Clinical Data: Nausea, vomiting, weakness  PORTABLE CHEST - 1 VIEW  Comparison: 04/20/2011  Findings: Stable cardiomegaly.  Perihilar and bibasilar interstitial and airspace edema or infiltrates   slightly increased since previous study.  No definite effusion.  Regional bones unremarkable.  IMPRESSION:  1.  Stable cardiomegaly with some increase in bilateral edema/infiltrates.  Original Report Authenticated By: Osa Craver, M.D.    Medications: I have reviewed the  patient's current medications.  Respiratory Failure: Hypercapnia, : ABG showed Ph 7.30, PCO2 63, . BIPAP. Nebulizer treatment. Will check Chest x ray. Transfer to step down unit. CCM/ Pulmonary consulted, I will start antibiotics to cover for community PNA. Check CBC, B-met.  CAD/ mild increase troponin, stopped heparin. Continue with Plavix and aspirin. Coreg stop. Patient was started on bisoprolol.  Diastolic HF: continue with lasix but will need to monitor for worsening renal function, hypotension in setting of possible lung infection.  AMS: probably hypercapnia, CBG today 150, I will hold psych medications, sedatives.  DM: hold Lantus in event of recent hypoglycemia. SSI.    Carmon Sahli M.D.  [facility] 08/30/2011, 9:07 AM

## 2011-08-30 NOTE — Progress Notes (Signed)
Noted on Med Rec that patient was taking both Novolog and Humalog.  Apparently patient was given Novolog after a friend of her's passed away, but substituted Novolog for Lantus instead of Humalog. (Has been doing this for the last couple of months.)  Patient did say that she had a couple of hypoglycemic episodes during the last couple weeks.  Doesn't check CBG's on a regular basis but knows that she should.  States she went for outpatient diabetes education a couple months ago and doesn't feel she needs to go back.  Needs a new PCP because her MD has relocated.

## 2011-08-30 NOTE — Progress Notes (Signed)
  Echocardiogram 2D Echocardiogram has been performed.  Krista Wise 08/30/2011, 4:37 PM

## 2011-08-31 LAB — CARDIAC PANEL(CRET KIN+CKTOT+MB+TROPI)
Relative Index: 0.9 (ref 0.0–2.5)
Total CK: 437 U/L — ABNORMAL HIGH (ref 7–177)

## 2011-08-31 LAB — GLUCOSE, CAPILLARY
Glucose-Capillary: 316 mg/dL — ABNORMAL HIGH (ref 70–99)
Glucose-Capillary: 224 mg/dL — ABNORMAL HIGH (ref 70–99)
Glucose-Capillary: 188 mg/dL — ABNORMAL HIGH (ref 70–99)

## 2011-08-31 LAB — BASIC METABOLIC PANEL
CO2: 31 mEq/L (ref 19–32)
Chloride: 92 mEq/L — ABNORMAL LOW (ref 96–112)
Creatinine, Ser: 1.53 mg/dL — ABNORMAL HIGH (ref 0.50–1.10)
Glucose, Bld: 206 mg/dL — ABNORMAL HIGH (ref 70–99)

## 2011-08-31 LAB — CBC
HCT: 35.2 % — ABNORMAL LOW (ref 36.0–46.0)
MCV: 87.3 fL (ref 78.0–100.0)
RBC: 4.03 MIL/uL (ref 3.87–5.11)
RDW: 16.5 % — ABNORMAL HIGH (ref 11.5–15.5)
WBC: 5.9 10*3/uL (ref 4.0–10.5)

## 2011-08-31 LAB — PROCALCITONIN: Procalcitonin: 0.17 ng/mL

## 2011-08-31 MED ORDER — INSULIN ASPART 100 UNIT/ML ~~LOC~~ SOLN
6.0000 [IU] | Freq: Three times a day (TID) | SUBCUTANEOUS | Status: DC
  Administered 2011-08-31 – 2011-09-06 (×16): 6 [IU] via SUBCUTANEOUS

## 2011-08-31 MED ORDER — GUAIFENESIN-DM 100-10 MG/5ML PO SYRP
5.0000 mL | ORAL_SOLUTION | ORAL | Status: DC | PRN
  Administered 2011-08-31 – 2011-09-01 (×2): 5 mL via ORAL
  Filled 2011-08-31 (×2): qty 5

## 2011-08-31 MED ORDER — BUDESONIDE 0.25 MG/2ML IN SUSP
0.2500 mg | Freq: Two times a day (BID) | RESPIRATORY_TRACT | Status: DC
  Administered 2011-09-01 – 2011-09-06 (×10): 0.25 mg via RESPIRATORY_TRACT
  Filled 2011-08-31 (×13): qty 2

## 2011-08-31 MED ORDER — INSULIN ASPART 100 UNIT/ML ~~LOC~~ SOLN
0.0000 [IU] | Freq: Three times a day (TID) | SUBCUTANEOUS | Status: DC
  Administered 2011-08-31 – 2011-09-01 (×4): 7 [IU] via SUBCUTANEOUS
  Administered 2011-09-02: 4 [IU] via SUBCUTANEOUS
  Administered 2011-09-02: 7 [IU] via SUBCUTANEOUS
  Administered 2011-09-02: 20 [IU] via SUBCUTANEOUS
  Administered 2011-09-03: 4 [IU] via SUBCUTANEOUS
  Administered 2011-09-03: 7 [IU] via SUBCUTANEOUS
  Administered 2011-09-03: 11 [IU] via SUBCUTANEOUS
  Administered 2011-09-04: 15 [IU] via SUBCUTANEOUS
  Administered 2011-09-04: 11 [IU] via SUBCUTANEOUS
  Administered 2011-09-04: 4 [IU] via SUBCUTANEOUS
  Administered 2011-09-05: 11 [IU] via SUBCUTANEOUS
  Administered 2011-09-05: 7 [IU] via SUBCUTANEOUS
  Administered 2011-09-05 – 2011-09-06 (×2): 11 [IU] via SUBCUTANEOUS
  Filled 2011-08-31 (×2): qty 3

## 2011-08-31 MED ORDER — INSULIN ASPART 100 UNIT/ML ~~LOC~~ SOLN
0.0000 [IU] | Freq: Every day | SUBCUTANEOUS | Status: DC
  Administered 2011-09-02: 5 [IU] via SUBCUTANEOUS
  Administered 2011-09-03 – 2011-09-04 (×2): 3 [IU] via SUBCUTANEOUS
  Filled 2011-08-31: qty 3

## 2011-08-31 MED ORDER — HEPARIN SODIUM (PORCINE) 5000 UNIT/ML IJ SOLN
5000.0000 [IU] | Freq: Three times a day (TID) | INTRAMUSCULAR | Status: DC
  Administered 2011-08-31 – 2011-09-06 (×18): 5000 [IU] via SUBCUTANEOUS
  Filled 2011-08-31 (×23): qty 1

## 2011-08-31 MED ORDER — INSULIN GLARGINE 100 UNIT/ML ~~LOC~~ SOLN
20.0000 [IU] | Freq: Every day | SUBCUTANEOUS | Status: DC
  Administered 2011-08-31 – 2011-09-01 (×2): 20 [IU] via SUBCUTANEOUS
  Filled 2011-08-31: qty 3

## 2011-08-31 NOTE — Progress Notes (Signed)
Patient ID: Krista Wise, female   DOB: Aug 16, 1945, 66 y.o.   MRN: [id number]     SUBJECTIVE:  Breathing better this morning, more awake.       . albuterol  2.5 mg Nebulization Q6H  . antiseptic oral rinse  15 mL Mouth Rinse BID  . aspirin EC  81 mg Oral Daily  . azithromycin  500 mg Intravenous Q24H  . bisoprolol  5 mg Oral Daily  . budesonide  0.25 mg Nebulization Q6H  . cefTRIAXone (ROCEPHIN)  IV  1 g Intravenous Q24H  . clopidogrel  75 mg Oral Q breakfast  . furosemide  40 mg Oral BID  . insulin aspart  0-15 Units Subcutaneous TID WC  . ipratropium  0.5 mg Nebulization Q6H  . rosuvastatin  20 mg Oral Daily  . sodium chloride  3 mL Intravenous Q12H  . DISCONTD: buPROPion  150 mg Oral BID  . DISCONTD: carvedilol  12.5 mg Oral BID WC  . DISCONTD: insulin glargine  20 Units Subcutaneous QHS  . DISCONTD: potassium chloride SA  20 mEq Oral BID  . DISCONTD: pregabalin  75 mg Oral BID  . DISCONTD: rOPINIRole  2 mg Oral QHS      Filed Vitals:   08/31/11 0100 08/31/11 0258 08/31/11 0423 08/31/11 0747  BP: 127/64  135/53   Pulse: 66  61   Temp: 98 F (36.7 C)  98 F (36.7 C)   TempSrc: Oral  Oral   Resp: 20  17   Height:      Weight: 126 kg (277 lb 12.5 oz)     SpO2: 90% 94% 96% 98%    Intake/Output Summary (Last 24 hours) at 08/31/11 0805 Last data filed at 08/31/11 0500  Gross per 24 hour  Intake   1033 ml  Output   1900 ml  Net   -867 ml    LABS: Basic Metabolic Panel:  Basename 08/30/11 1116 08/29/11 1836  NA 133* 138  K 4.8 4.8  CL 94* 96  CO2 30 32  GLUCOSE 187* 145*  BUN 25* 21  CREATININE 2.22* 1.84*  CALCIUM 9.8 10.4  MG -- --  PHOS -- --   Liver Function Tests:  Basename 08/30/11 1116  AST 16  ALT 9  ALKPHOS 57  BILITOT 0.2*  PROT 6.5  ALBUMIN 2.7*   No results found for this basename: LIPASE:2,AMYLASE:2 in the last 72 hours CBC:  Basename 08/30/11 1116 08/30/11 0215 08/29/11 1836  WBC 7.1 7.6 --  NEUTROABS -- -- 6.0  HGB 10.2*  10.6* --  HCT 33.6* 34.7* --  MCV 88.2 88.1 --  PLT 184 199 --   Cardiac Enzymes:  Basename 08/31/11 0251 08/30/11 1818 08/30/11 1116  CKTOTAL 437* 112 77  CKMB 4.1* 2.0 1.9  CKMBINDEX -- -- --  TROPONINI 0.43* 0.45* 0.47*  proBNP 3335  D-Dimer: No results found for this basename: DDIMER:2 in the last 72 hours Hemoglobin A1C:  Basename 08/29/11 2217  HGBA1C 9.1*   Fasting Lipid Panel: No results found for this basename: CHOL,HDL,LDLCALC,TRIG,CHOLHDL,LDLDIRECT in the last 72 hours Thyroid Function Tests: No results found for this basename: TSH,T4TOTAL,FREET3,T3FREE,THYROIDAB in the last 72 hours Anemia Panel: No results found for this basename: VITAMINB12,FOLATE,FERRITIN,TIBC,IRON,RETICCTPCT in the last 72 hours  RADIOLOGY: Dg Chest Port 1 View  08/29/2011  *RADIOLOGY REPORT*  Clinical Data: Nausea, vomiting, weakness  PORTABLE CHEST - 1 VIEW  Comparison: 04/20/2011  Findings: Stable cardiomegaly.  Perihilar and bibasilar interstitial and airspace edema or  infiltrates   slightly increased since previous study.  No definite effusion.  Regional bones unremarkable.  IMPRESSION:  1.  Stable cardiomegaly with some increase in bilateral edema/infiltrates.  Original Report Authenticated By: Osa Craver, M.D.    PHYSICAL EXAM General: Bipap, mild tachypnea Neck: Thick, JVP 9-10 cm, no thyromegaly or thyroid nodule.  Lungs: Bilateral expiratory wheezes CV: Nondisplaced PMI.  Heart regular S1/S2, no S3/S4, no murmur.  1+ bilateral ankle edema.  No carotid bruit.  Abdomen: Soft, nontender, no hepatosplenomegaly, no distention.  Neurologic: Sedated, oriented to place Psych: Normal affect. Extremities: No clubbing or cyanosis.   ASSESSMENT AND PLAN:  66 yo with history of CAD, diastolic CHF, OHS and OSA admitted with dyspnea/wheezing, hypercarbic respiratory failure. 1. Pulmonary: Hypercarbic respiratory failure.   At baseline, she wears home O2 during the day for OHS and has  OSA but did not tolerate CPAP.  She continues to have wheezing.  I suspect that she has a respiratory infection (likely bronchitis) with decompensation of her baseline poor respiratory status.  [name] now afebrile. - Covering with empiric antibiotics. - O2 during the day, CPAP at night - Albuterol/atrovent nebs, inhaled steroids - Pulmonary following.  2. CAD: h/o PCI to CFX.  Continue ASA, Plavix, statin.  Beta blocker changed to bisoprolol with wheezing.  TnI mildly elevated with normal CKMB in the setting of severe hypoglycemia with syncope as well as respiratory failure.  No chest pain. I am inclined to think that this is demand ischemia.  Probable functional study when recovered from current episode.  3. CHF: Has significant diastolic CHF, on Lasix at home.  Some pulmonary vascular congestion on CXR.  BNP is elevated.  I do not think that this is the root cause of her current decompensation, but she does appear to have some volume overload on exam.  [name] getting her home dose of po Lasix currently.  I have not aggressively diuresed her given rise in creatinine.  Need to get BMET this am to see which way renal function is going.  EF was normal on echo with at least moderate pulmonary HTN.  The pulmonary HTN is likely due to undertreated OHS/OSA.  She may need a right heart cath this admission.   Krista Wise 08/31/2011 8:05 AM

## 2011-08-31 NOTE — Progress Notes (Signed)
PT STATED THAT SHE NEEDS A NEW PCP, I GAVE HER THE HEALTH CONNECT NO.  WILL F/U ON ANY DC NEEDS Willa Rough [phone] OR [phone] 08/31/2011

## 2011-08-31 NOTE — Progress Notes (Signed)
Order to transfer pt.  BED available on 2000.  Report called to [facility] on 2000.  Pt and family notified of transfer.  Pt to go to room 2031 via wc.  All belongings packed.

## 2011-08-31 NOTE — Consult Note (Signed)
PULMONARY/CCM FOLLOW UP NOTE  Requesting MD/Service: Krista Wise Date of admission:  2/4 Date of consult: 2/5 Reason for consultation: hypercarbic resp failure  Pt Profile:  44 yowf with PMH of OSA/OHS seen by Dr Shelle Iron in Oct 2012 and intolerant to CPAP. Admitted by [facility] after hypoglycemic syncopal episode. Hosp day one found to be more dyspneic and lethargic with audible wheezes. ABG 7.31/63/56 on 2lpm [place]. Transferred to SDU and Pulm med asked to assist in mgmt. She has been intolerant to NPPV.  Overnight: Rested comfortably  Filed Vitals:   08/31/11 0423 08/31/11 0747 08/31/11 0800 08/31/11 1000  BP: 135/53  118/75   Pulse: 61  65 64  Temp: 98 F (36.7 C)  98.6 F (37 C)   TempSrc: Oral  Oral   Resp: 17  19 18   Height:      Weight:      SpO2: 96% 98% 100% 95%    EXAM:   Gen: awake and alert, comfortable HEENT: NCAT, PERRL, EOMi PULM: Diminished throughout, crackles in bases CV: RRR, no mgr Ab: soft, nontender, no hsm Ext: significant pitting edema Neuro: A&Ox4, maew   DATA: 2/5 CXR; CM, vasc congestion, minimal interstitial prominence  BMET    Component Value Date/Time   NA 134* 08/31/2011 0904   K 4.4 08/31/2011 0904   CL 92* 08/31/2011 0904   CO2 31 08/31/2011 0904   GLUCOSE 206* 08/31/2011 0904   BUN 28* 08/31/2011 0904   CREATININE 1.53* 08/31/2011 0904   CALCIUM 9.5 08/31/2011 0904   GFRNONAA 35* 08/31/2011 0904   GFRAA 41* 08/31/2011 0904    CBC    Component Value Date/Time   WBC 5.9 08/31/2011 0904   RBC 4.03 08/31/2011 0904   HGB 10.9* 08/31/2011 0904   HCT 35.2* 08/31/2011 0904   PLT 189 08/31/2011 0904   MCV 87.3 08/31/2011 0904   MCH 27.0 08/31/2011 0904   MCHC 31.0 08/31/2011 0904   RDW 16.5* 08/31/2011 0904   LYMPHSABS 1.2 08/29/2011 1836   MONOABS 0.7 08/29/2011 1836   EOSABS 0.1 08/29/2011 1836   BASOSABS 0.0 08/29/2011 1836      IMPRESSION/PLAN:   Acute hypercarbic resp failure -likely chronic hypercarbia from obesity hypoventilation syndrome with concomitant OSA.   Unclear exactly why she decompensated but suspect decreased resp drive in setting of hypoglycemia led to worsening hypercarbia.  All this was likely made worse by underlying bronchitis and bronchospasm and possibly volume overload.   -would continue pulmicort bid as you are doing -continue duonebs -no evidence of pneumonia, I will stop ceftriaxone, would maintain her on azithromycin for bronchitis -gentle diuresis -continue to use CPAP at night, her husbanc to bring in home machine -doubt she needs BIPAP now that she is out of the acute phase -will follow and will help set up out patient follow up  Max Fickle Pager [phone]

## 2011-08-31 NOTE — Progress Notes (Signed)
[facility] [place] TEAM 8  Subjective: 66 year old female was brought to the hospital, after she took insulin at home, developed hypoglycemia, and passed out.  EKG in the hospital revealed ST depression in leads 1 V4 V5 V6 and she also had mild elevation of troponin 0.15. Hosp day one found to be more dyspneic and lethargic with audible wheezes. ABG 7.31/63/56 on 2lpm [place]. Transferred to SDU.    Today she is much improved.  She reports that her breathing is close to her baseline.  She reports no further issues with syncope/presyncope.  She denies cp, f/c, n/v, or abdom pain.    Objective: Weight change: -0.5 kg (-1 lb 1.6 oz)  Intake/Output Summary (Last 24 hours) at 08/31/11 1354 Last data filed at 08/31/11 1254  Gross per 24 hour  Intake   1113 ml  Output   3050 ml  Net  -1937 ml   Blood pressure 121/53, pulse 62, temperature 98.4 F (36.9 C), temperature source Oral, resp. rate 19, height 5\' 5"  (1.651 m), weight 126 kg (277 lb 12.5 oz), SpO2 95.00%.  Physical Exam: General: No acute respiratory distress Lungs: Clear to auscultation bilaterally without wheezes or crackles - BS are distant th/o  Cardiovascular: Regular rate and rhythm without murmur gallop or rub normal S1 and S2 Abdomen: Nontender, nondistended, soft, bowel sounds positive, no rebound, no ascites, no appreciable mass - obese Extremities: No significant cyanosis, or clubbing, 1+ edema bilateral lower extremities  Lab Results:  [facility] 08/31/11 0904 08/30/11 1116 08/29/11 1836  NA 134* 133* 138  K 4.4 4.8 4.8  CL 92* 94* 96  CO2 31 30 32  GLUCOSE 206* 187* 145*  BUN 28* 25* 21  CREATININE 1.53* 2.22* 1.84*  CALCIUM 9.5 9.8 10.4  MG -- -- --  PHOS -- -- --    Basename 08/30/11 1116  AST 16  ALT 9  ALKPHOS 57  BILITOT 0.2*  PROT 6.5  ALBUMIN 2.7*    Basename 08/31/11 0904 08/30/11 1116 08/30/11 0215 08/29/11 1836  WBC 5.9 7.1 7.6 --  NEUTROABS -- -- -- 6.0  HGB 10.9* 10.2* 10.6* --    HCT 35.2* 33.6* 34.7* --  MCV 87.3 88.2 88.1 --  PLT 189 184 199 --    Basename 08/31/11 0251 08/30/11 1818 08/30/11 1116  CKTOTAL 437* 112 77  CKMB 4.1* 2.0 1.9  CKMBINDEX -- -- --  TROPONINI 0.43* 0.45* 0.47*    Basename 08/29/11 2217  HGBA1C 9.1*    Micro Results: Recent Results (from the past 240 hour(s))  MRSA PCR SCREENING     Status: Normal   Collection Time   08/30/11 10:32 AM      Component Value Range Status Comment   MRSA by PCR NEGATIVE  NEGATIVE  Final     Studies/Results: All recent x-ray/radiology reports have been reviewed in detail.   Medications: I have reviewed the patient's complete medication list.  Assessment/Plan:  Hypercarbic resp failure Acute on chronic - does not utilize home CPAP - suspect an element of acute bronchitis comlicating picture - PCCM has seen - I have reviewed their note and reccs - she appears to be at her baseline mental status   Nonspecific troponin elevation in setting of hypoglycemic episode/h/o PCI to CFX Has been seen by Cards - see their reccs - no plans for further ischemia w/u  DM w/ hypoglycemia Now CBGs are elevated - it appears hypoglycemia was due to pt taking Novolog by mistake at dose for which  Lantus was prescribed, along with her usual humalog - will begin to adjust standing regimen and follow CBGs - A1c is noted to be quite elevated   Hyperlipidemia Is on medical tx  Acute on chronic renal insufficiency Essentially resolved - follow crt trend  Diastolic CHF Pt appears to be mildlly volume overloaded at present - will continue her home dose of lasix, but may have to increase it if her renal fxn remains stable on f/u in the AM  OHS/SA Does not wear CPAP at home - wears daytime O2 at home -   Mod pulm HTN (via echo) Cards is considering a R heart cath   Dispo Stable for transfer to Tele bed  Lonia Blood, MD [facility] Office  [phone] Pager [phone]  On-Call/Text Page:       [email or web address]      password [facility]

## 2011-09-01 DIAGNOSIS — J9620 Acute and chronic respiratory failure, unspecified whether with hypoxia or hypercapnia: Secondary | ICD-10-CM

## 2011-09-01 DIAGNOSIS — R0609 Other forms of dyspnea: Secondary | ICD-10-CM

## 2011-09-01 DIAGNOSIS — R0989 Other specified symptoms and signs involving the circulatory and respiratory systems: Secondary | ICD-10-CM

## 2011-09-01 DIAGNOSIS — J9801 Acute bronchospasm: Secondary | ICD-10-CM

## 2011-09-01 LAB — GLUCOSE, CAPILLARY: Glucose-Capillary: 226 mg/dL — ABNORMAL HIGH (ref 70–99)

## 2011-09-01 LAB — BASIC METABOLIC PANEL
CO2: 33 mEq/L — ABNORMAL HIGH (ref 19–32)
Calcium: 9.1 mg/dL (ref 8.4–10.5)
Potassium: 4.5 mEq/L (ref 3.5–5.1)
Sodium: 134 mEq/L — ABNORMAL LOW (ref 135–145)

## 2011-09-01 MED ORDER — IPRATROPIUM BROMIDE 0.02 % IN SOLN
0.5000 mg | Freq: Two times a day (BID) | RESPIRATORY_TRACT | Status: DC
Start: 2011-09-02 — End: 2011-09-06
  Administered 2011-09-02 – 2011-09-06 (×9): 0.5 mg via RESPIRATORY_TRACT
  Filled 2011-09-01 (×8): qty 2.5

## 2011-09-01 MED ORDER — ALBUTEROL SULFATE (5 MG/ML) 0.5% IN NEBU
2.5000 mg | INHALATION_SOLUTION | Freq: Two times a day (BID) | RESPIRATORY_TRACT | Status: DC
  Administered 2011-09-02 – 2011-09-06 (×9): 2.5 mg via RESPIRATORY_TRACT
  Filled 2011-09-01 (×10): qty 0.5

## 2011-09-01 MED ORDER — FUROSEMIDE 10 MG/ML IJ SOLN
40.0000 mg | Freq: Two times a day (BID) | INTRAMUSCULAR | Status: DC
  Administered 2011-09-01 – 2011-09-04 (×7): 40 mg via INTRAVENOUS
  Filled 2011-09-01 (×10): qty 4

## 2011-09-01 NOTE — Evaluation (Signed)
Physical Therapy Evaluation Patient Details Name: Krista Wise MRN: [id number] DOB: October 10, 1945 Today's Date: 09/01/2011  Problem List:  Patient Active Problem List  Diagnoses  . HYPERTENSION, UNSPECIFIED  . CAD  . CHF  . SHORTNESS OF BREATH  . Hyperlipidemia  . Vertigo  . Gait instability  . OSA (obstructive sleep apnea)    Past Medical History:  Past Medical History  Diagnosis Date  . Hypertension   . Depression   . Diabetes mellitus   . GERD (gastroesophageal reflux disease)   . Obesity   . Coronary artery disease 08/2006    Unstable angina. PCI to distal codominant CFX with 2.5 x 20 Taxus DES...   Past Surgical History:  Past Surgical History  Procedure Date  . Coronary stent placement 08/2006    CAD; stent placed @ [facility]  . Total abdominal hysterectomy     PT Assessment/Plan/Recommendation PT Assessment Clinical Impression Statement: 66 year old female was brought to the hospital, after she took insulin at home, developed hypoglycemia, and passed out.  EKG in the hospital revealed ST depression in leads 1 V4 V5 V6 and she also had mild elevation of troponin 0.15. Hosp day one found to be more dyspneic and lethargic with audible wheezes. ABG 7.31/63/56 on 2lpm [place]. Transferred to SDU.  Transferred back to 2000 08/31/11. Pt presents to physical therapy this morning sitting upright in chair with family present. [name] disoriented to place but appears aware that she is confused. Demonstrates impaired mobility, balance and activity tolerance limiting her independence. Will benefit physical therapy in the acute setting to address these and the following problem list so as to increase independence and safety and decrease BOC at home. Will benefit HHPT on d/c home. Will also need initial 24 hour supervision/assist at home.   PT Recommendation/Assessment: Patient will need skilled PT in the acute care venue PT Problem List: Decreased strength;Decreased activity  tolerance;Decreased balance;Decreased mobility;Impaired sensation;Cardiopulmonary status limiting activity;Decreased cognition;Decreased safety awareness;Obesity;Decreased knowledge of use of DME PT Therapy Diagnosis : Difficulty walking;Generalized weakness;Altered mental status;Abnormality of gait PT Plan PT Frequency: Min 3X/week PT Treatment/Interventions: DME instruction;Gait training;Functional mobility training;Therapeutic activities;Therapeutic exercise;Balance training;Neuromuscular re-education;Cognitive remediation;Patient/family education PT Recommendation Recommendations for Other Services: OT consult Follow Up Recommendations: Home health PT;Supervision/Assistance - 24 hour Equipment Recommended: None recommended by PT PT Goals  Acute Rehab PT Goals PT Goal Formulation: With patient Time For Goal Achievement: 7 days Pt will go Supine/Side to Sit: with modified independence PT Goal: Supine/Side to Sit - Progress: Goal set today Pt will go Sit to Supine/Side: with modified independence PT Goal: Sit to Supine/Side - Progress: Goal set today Pt will go Sit to Stand: with modified independence PT Goal: Sit to Stand - Progress: Goal set today Pt will go Stand to Sit: with modified independence PT Goal: Stand to Sit - Progress: Goal set today Pt will Transfer Bed to Chair/Chair to Bed: with modified independence PT Transfer Goal: Bed to Chair/Chair to Bed - Progress: Goal set today Pt will Ambulate: 51 - 150 feet;with modified independence;with least restrictive assistive device PT Goal: Ambulate - Progress: Goal set today Pt will Perform Home Exercise Program: with supervision, verbal cues required/provided PT Goal: Perform Home Exercise Program - Progress: Goal set today  PT Evaluation Precautions/Restrictions  Precautions Precautions: Fall Prior Functioning  Home Living Lives With: Spouse;Family (brother-in-law and sister) Dolores Lory Help From: Family Type of Home:  House Home Layout: Multi-level;Full bath on main level;Able to live on main level with bedroom/bathroom Home Access:  Ramped entrance Bathroom Shower/Tub: Health visitor: Standard Home Adaptive Equipment: Bedside commode/3-in-1;Grab bars around toilet;Grab bars in shower;Built-in shower seat;Walker - rolling;Wheelchair - Cytogeneticist - four wheeled;Wheelchair - manual Additional Comments: brother-in-law states that pt no longer drives, can only walk about 15 ft in the house without getting tired, uses RW but also has scooter, manual w/c; pt performs bathing and dressing independently but reports increased difficulty because of activity tolerance Prior Function Level of Independence: Independent with basic ADLs;Needs assistance with homemaking;Requires assistive device for independence (RW for gait) Driving: No Vocation: Unemployed Comments: full house; someone with her all the time Cognition Cognition Arousal/Alertness: Awake/alert Overall Cognitive Status: Impaired Orientation Level: Oriented to person;Disoriented to place;Disoriented to time;Oriented to situation (was able to look at calendar without cues for orientation q ) Awareness of Errors: Decreased awareness of errors made Decreased Awareness of Errors: Assistance required to correct errors made Problem Solving: Requires assistance for problem solving Cognition - Other Comments: pt appeared aware that she was confused; family reports there is a history of memory issues, worse in the morning and mid afternoon; assist for problem solving during transfers; When I arrived in pt's room she was on 2 L [place] and sats were 92%, I left briefly to get equipment and when I returned pt had removed her [place] and could not tell me why other than this wasn't hers, when checking her sats they were 80%, upon replacing her [place] they measured 92% again, RN made aware of encounter Sensation/Coordination Sensation Light Touch: Impaired by gross  assessment Additional Comments: 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 Coordination Gross Motor Movements are Fluid and Coordinated: Yes Fine Motor Movements are Fluid and Coordinated: Yes Extremity Assessment RUE Assessment RUE Assessment:  (defer to OT) LUE Assessment LUE Assessment:  (defer to OT) RLE Assessment RLE Assessment:  (grossly 4/5) LLE Assessment LLE Assessment:  (grossly 4/5) Mobility (including Balance) Bed Mobility Bed Mobility: No Transfers Transfers: Yes Sit to Stand: 4: Min assist;From chair/3-in-1;With upper extremity assist;With armrests Sit to Stand Details (indicate cue type and reason): assist for tecnique and follow through to stand Stand to Sit: 4: Min assist;To chair/3-in-1;With upper extremity assist Stand to Sit Details: cues for technique Stand Pivot Transfers: 4: Min assist Stand Pivot Transfer Details (indicate cue type and reason): SPT chair->3in1 with minA hand held assist and cues for sequencing with hand placment and getting hips fully square prior to sitting; pt uses arm rests to guide hips to chair and control descent with verbal cues  Ambulation/Gait Ambulation/Gait: Yes Ambulation/Gait Assistance: 4: Min assist Ambulation/Gait Assistance Details (indicate cue type and reason): amb approx 24 ft with RW; amb with flexed trunk, inferior gaze; cues for safety with RW; experiences DOE during gait on 2 L pt dropped to 82% after 12 ft; with verbal cueing and one standing rest break SaO2 rose to 92%; with fatigue pt becomes impulsive needing cueing to slow down and breathe Ambulation Distance (Feet): 24 Feet Assistive device: Rolling walker Gait Pattern: Trunk flexed;Decreased stride length;Shuffle  Posture/Postural Control Posture/Postural Control: No significant limitations Balance Balance Assessed: No Exercise    End of Session PT - End of Session Equipment Utilized During Treatment: Gait belt Activity Tolerance: Patient limited by  fatigue;Patient tolerated treatment well Patient left: in chair;with call bell in reach (chair alarm) Nurse Communication: Mobility status for transfers;Mobility status for ambulation General Behavior During Session: [facility] for tasks performed Cognition: Impaired, at baseline  [facility] [name] 09/01/2011, 10:13 AM

## 2011-09-01 NOTE — Evaluation (Signed)
Occupational Therapy Evaluation Patient Details Name: Krista Wise MRN: [id number] DOB: 10-09-1945 Today's Date: 09/01/2011  Problem List:  Patient Active Problem List  Diagnoses  . HYPERTENSION, UNSPECIFIED  . CAD  . CHF  . SHORTNESS OF BREATH  . Hyperlipidemia  . Vertigo  . Gait instability  . OSA (obstructive sleep apnea)    Past Medical History:  Past Medical History  Diagnosis Date  . Hypertension   . Depression   . Diabetes mellitus   . GERD (gastroesophageal reflux disease)   . Obesity   . Coronary artery disease 08/2006    Unstable angina. PCI to distal codominant CFX with 2.5 x 20 Taxus DES...   Past Surgical History:  Past Surgical History  Procedure Date  . Coronary stent placement 08/2006    CAD; stent placed @ [facility]  . Total abdominal hysterectomy     OT Assessment/Plan/Recommendation OT Assessment Clinical Impression Statement: Pt. presents to the hospital with hypoglycemia and SOB with history of CHF and CAD. Pt. with decreased activity tolerance and will benefit from skilled OT to get to supervision level with ADLs at D/C home. OT Recommendation/Assessment: Patient will need skilled OT in the acute care venue OT Problem List: Decreased strength;Decreased activity tolerance;Impaired balance (sitting and/or standing);Decreased safety awareness;Decreased knowledge of use of DME or AE;Decreased knowledge of precautions;Cardiopulmonary status limiting activity Barriers to Discharge: None OT Therapy Diagnosis : Cognitive deficits;Generalized weakness OT Plan OT Frequency: Min 2X/week OT Treatment/Interventions: Self-care/ADL training;DME and/or AE instruction;Therapeutic activities;Patient/family education;Balance training OT Recommendation Follow Up Recommendations: Home health OT;Supervision/Assistance - 24 hour Equipment Recommended: None recommended by OT Individuals Consulted Consulted and Agree with Results and Recommendations: Patient OT  Goals Acute Rehab OT Goals OT Goal Formulation: With patient Time For Goal Achievement: 7 days ADL Goals Pt Will Perform Grooming: with set-up;with supervision;Standing at sink ADL Goal: Grooming - Progress: Goal set today Pt Will Perform Lower Body Dressing: with set-up;with supervision;with adaptive equipment;Sit to stand from chair ADL Goal: Lower Body Dressing - Progress: Goal set today Pt Will Transfer to Toilet: with set-up;with supervision;3-in-1;with DME;Ambulation ADL Goal: Toilet Transfer - Progress: Goal set today  OT Evaluation Precautions/Restrictions  Precautions Precautions: Fall Restrictions Weight Bearing Restrictions: No Prior Functioning Home Living Lives With: Spouse;Family (brother-in-law and sister) Dolores Lory Help From: Family Type of Home: House Home Layout: Multi-level;Full bath on main level;Able to live on main level with bedroom/bathroom Home Access: Ramped entrance Bathroom Shower/Tub: Walk-in shower Bathroom Toilet: Standard Home Adaptive Equipment: Bedside commode/3-in-1;Grab bars around toilet;Grab bars in shower;Built-in shower seat;Walker - rolling;Wheelchair - Cytogeneticist - four wheeled;Wheelchair - manual Additional Comments: brother-in-law states that pt no longer drives, can only walk about 15 ft in the house without getting tired, uses RW but also has scooter, manual w/c; pt performs bathing and dressing independently but reports increased difficulty because of activity tolerance Prior Function Level of Independence: Independent with basic ADLs;Needs assistance with homemaking;Requires assistive device for independence (RW for gait) Driving: No Vocation: Unemployed ADL ADL Eating/Feeding: Performed;Independent Where Assessed - Eating/Feeding: Chair Grooming: Performed;Wash/dry hands;Minimal assistance;Set up Grooming Details (indicate cue type and reason): Min assist for balance at sink and min verbal cues for hand placement on RW for  safety Where Assessed - Grooming: Standing at sink Upper Body Bathing: Simulated;Chest;Right arm;Left arm;Abdomen;Set up Where Assessed - Upper Body Bathing: Sitting, chair Lower Body Bathing: Simulated;+1 Total assistance Where Assessed - Lower Body Bathing: Sit to stand from chair Upper Body Dressing: Performed;Minimal assistance Upper Body Dressing Details (indicate cue  type and reason): with donning gown Where Assessed - Upper Body Dressing: Sitting, chair Lower Body Dressing: Simulated;+1 Total assistance Lower Body Dressing Details (indicate cue type and reason): Pt. unable to reach LE Where Assessed - Lower Body Dressing: Sitting, chair Toilet Transfer: Performed;Minimal assistance Toilet Transfer Details (indicate cue type and reason): Min verbal cues for hand placement and technique Toilet Transfer Method: Ambulating Toilet Transfer Equipment: Raised toilet seat with arms (or 3-in-1 over toilet) Toileting - Clothing Manipulation: Simulated;Minimal assistance Toileting - Clothing Manipulation Details (indicate cue type and reason): with moving gown Where Assessed - Toileting Clothing Manipulation: Standing Toileting - Hygiene: Simulated;Moderate assistance Toileting - Hygiene Details (indicate cue type and reason): For thoroughness reaching backside Where Assessed - Toileting Hygiene: Standing Tub/Shower Transfer: Not assessed Equipment Used: Rolling walker Ambulation Related to ADLs: Pt. min assist for balance and safety with RW  ADL Comments: Pt. educated and provided with handout on energy conservation techniques with ADLs to increase activity tolerance and independence. Pt. will benefit from further education on use of AE to complete. Vision/Perception  Vision - History Baseline Vision: Wears glasses all the time Patient Visual Report: No change from baseline Vision - Assessment Eye Alignment: Within Functional Limits Cognition Cognition Arousal/Alertness:  Awake/alert Overall Cognitive Status: Impaired Orientation Level: Oriented to person;Disoriented to place;Disoriented to time;Oriented to situation Awareness of Errors: Decreased awareness of errors made Decreased Awareness of Errors: Assistance required to correct errors made Problem Solving: Requires assistance for problem solving Cognition - Other Comments: pt. with mild confusion and requiring increased time for processing during session. pt. with decreased safety awareness Sensation/Coordination Sensation Light Touch: Not tested Stereognosis: Not tested Extremity Assessment RUE Assessment RUE Assessment: Within Functional Limits LUE Assessment LUE Assessment: Within Functional Limits Mobility  Bed Mobility Bed Mobility: No Transfers Transfers: Yes Sit to Stand: 4: Min assist;From chair/3-in-1;With upper extremity assist;With armrests Sit to Stand Details (indicate cue type and reason): Min verbal cues for hand placement on RW    End of Session OT - End of Session Equipment Utilized During Treatment: Gait belt Activity Tolerance: Patient tolerated treatment well Patient left: in chair;with call bell in reach;Other (comment) (with chair alarm) Nurse Communication: Mobility status for transfers General Behavior During Session: [facility] for tasks performed Cognition: Impaired, at baseline   Havard Radigan, OTR/L Pager [phone] 09/01/2011, 2:37 PM

## 2011-09-01 NOTE — Progress Notes (Addendum)
Pt. Alert and oriented x 3, disoriented to place, some memory impairment and forgetfulness. Pt asking repetitve questions about how she ended up in room 2031, and asking about people sneaking in from the back door. VS stable. BS 215. Will continue to monitor.

## 2011-09-01 NOTE — Plan of Care (Signed)
Problem: Phase II Progression Outcomes Goal: Walk in hall or up in chair TID Outcome: Progressing Ambulated 24 ft with therapy today with minA and RW. Limited by dyspnea. Pt dropped to 82% after 12 ft on 2 L [place]. With standing rest break it increased to 92%.

## 2011-09-01 NOTE — Progress Notes (Signed)
Subjective: Pt in no acute distress.  States that she feels like she is breathing better today.  No acute issues overnight.  Objective: Filed Vitals:   09/01/11 0920 09/01/11 0935 09/01/11 1345 09/01/11 1523  BP:   130/65   Pulse:   66   Temp:   98.1 F (36.7 C)   TempSrc:   Oral   Resp:   22   Height:      Weight:      SpO2: 92% 80% 94% 96%   Weight change: -1.908 kg (-4 lb 3.3 oz)  Intake/Output Summary (Last 24 hours) at 09/01/11 1546 Last data filed at 09/01/11 1415  Gross per 24 hour  Intake    963 ml  Output   1800 ml  Net   -837 ml    General: Alert, awake, oriented x3, in no acute distress.  HEENT: No bruits, no goiter.  Heart: Regular rate and rhythm, + systolic grade II-III murmur, rubs, gallops.  Lungs: CTA BL  Abdomen: Soft, nontender, nondistended, positive bowel sounds.  Neuro: Grossly intact, nonfocal.   Lab Results:  [facility] 09/01/11 0550 08/31/11 0904  NA 134* 134*  K 4.5 4.4  CL 89* 92*  CO2 33* 31  GLUCOSE 225* 206*  BUN 29* 28*  CREATININE 1.16* 1.53*  CALCIUM 9.1 9.5  MG -- --  PHOS -- --    Basename 08/30/11 1116  AST 16  ALT 9  ALKPHOS 57  BILITOT 0.2*  PROT 6.5  ALBUMIN 2.7*   No results found for this basename: LIPASE:2,AMYLASE:2 in the last 72 hours  Basename 08/31/11 0904 08/30/11 1116 08/29/11 1836  WBC 5.9 7.1 --  NEUTROABS -- -- 6.0  HGB 10.9* 10.2* --  HCT 35.2* 33.6* --  MCV 87.3 88.2 --  PLT 189 184 --    Basename 08/31/11 0251 08/30/11 1818 08/30/11 1116  CKTOTAL 437* 112 77  CKMB 4.1* 2.0 1.9  CKMBINDEX -- -- --  TROPONINI 0.43* 0.45* 0.47*   No components found with this basename: POCBNP:3 No results found for this basename: DDIMER:2 in the last 72 hours  Basename 08/29/11 2217  HGBA1C 9.1*   No results found for this basename: CHOL:2,HDL:2,LDLCALC:2,TRIG:2,CHOLHDL:2,LDLDIRECT:2 in the last 72 hours  Basename 08/31/11 0200  TSH 3.456  T4TOTAL --  T3FREE --  THYROIDAB --   No results found for  this basename: VITAMINB12:2,FOLATE:2,FERRITIN:2,TIBC:2,IRON:2,RETICCTPCT:2 in the last 72 hours  Micro Results: Recent Results (from the past 240 hour(s))  MRSA PCR SCREENING     Status: Normal   Collection Time   08/30/11 10:32 AM      Component Value Range Status Comment   MRSA by PCR NEGATIVE  NEGATIVE  Final     Studies/Results: No results found.  Medications: I have reviewed the patient's current medications.  Acute on chronic hypoxic and hypercapnic respiratory failure in setting of OSA and probable OHS complicated by acute upper respiratory infection and reactive airway disease and secondary pulmonary hypertension.  OSA/OHS Pulm is on board.  Will discuss compliance of CPAP with patient as well.  Acute bronchitis with RAD Will continue antibiotics, nebs and inhaled steroids.  Have patient f/u with pulm for PFT's  Secondary Pulmonary htn:  Pulm on board.  Acute on Chronic Diastolic dysfunction. Stable at this juncture. Pt is on lasix, B blocker  HPL: Patient is on statin.  DM: At this point better controlled than yesterday.  Will continue to observe cbg's and continue current regimen. [id number]  LOS: 3 days   Penny Pia M.D.  [facility] 09/01/2011, 3:46 PM

## 2011-09-01 NOTE — Progress Notes (Signed)
Patient ID: Krista Wise, female   DOB: 1946/03/02, 66 y.o.   MRN: [id number]     SUBJECTIVE:  Doing well this morning.  Breathing better, creatinine down.      . albuterol  2.5 mg Nebulization Q6H  . antiseptic oral rinse  15 mL Mouth Rinse BID  . aspirin EC  81 mg Oral Daily  . azithromycin  500 mg Intravenous Q24H  . bisoprolol  5 mg Oral Daily  . budesonide  0.25 mg Nebulization BID  . clopidogrel  75 mg Oral Q breakfast  . furosemide  40 mg Intravenous BID  . heparin  5,000 Units Subcutaneous Q8H  . insulin aspart  0-20 Units Subcutaneous TID WC  . insulin aspart  0-5 Units Subcutaneous QHS  . insulin aspart  6 Units Subcutaneous TID WC  . insulin glargine  20 Units Subcutaneous QHS  . ipratropium  0.5 mg Nebulization Q6H  . rosuvastatin  20 mg Oral Daily  . sodium chloride  3 mL Intravenous Q12H  . DISCONTD: budesonide  0.25 mg Nebulization Q6H  . DISCONTD: cefTRIAXone (ROCEPHIN)  IV  1 g Intravenous Q24H  . DISCONTD: furosemide  40 mg Oral BID  . DISCONTD: insulin aspart  0-15 Units Subcutaneous TID WC      Filed Vitals:   08/31/11 2238 09/01/11 0112 09/01/11 0408 09/01/11 0414  BP:   139/81   Pulse: 65  63   Temp:   98.3 F (36.8 C)   TempSrc:   Oral   Resp: 18  18   Height:      Weight:    121.292 kg (267 lb 6.4 oz)  SpO2: 96% 97% 94%     Intake/Output Summary (Last 24 hours) at 09/01/11 0836 Last data filed at 08/31/11 2100  Gross per 24 hour  Intake   1260 ml  Output   1850 ml  Net   -590 ml    LABS: Basic Metabolic Panel:  Basename 09/01/11 0550 08/31/11 0904  NA 134* 134*  K 4.5 4.4  CL 89* 92*  CO2 33* 31  GLUCOSE 225* 206*  BUN 29* 28*  CREATININE 1.16* 1.53*  CALCIUM 9.1 9.5  MG -- --  PHOS -- --   Liver Function Tests:  Basename 08/30/11 1116  AST 16  ALT 9  ALKPHOS 57  BILITOT 0.2*  PROT 6.5  ALBUMIN 2.7*   No results found for this basename: LIPASE:2,AMYLASE:2 in the last 72 hours CBC:  Basename 08/31/11 0904  08/30/11 1116 08/29/11 1836  WBC 5.9 7.1 --  NEUTROABS -- -- 6.0  HGB 10.9* 10.2* --  HCT 35.2* 33.6* --  MCV 87.3 88.2 --  PLT 189 184 --   Cardiac Enzymes:  Basename 08/31/11 0251 08/30/11 1818 08/30/11 1116  CKTOTAL 437* 112 77  CKMB 4.1* 2.0 1.9  CKMBINDEX -- -- --  TROPONINI 0.43* 0.45* 0.47*  proBNP 3335  D-Dimer: No results found for this basename: DDIMER:2 in the last 72 hours Hemoglobin A1C:  Basename 08/29/11 2217  HGBA1C 9.1*   Fasting Lipid Panel: No results found for this basename: CHOL,HDL,LDLCALC,TRIG,CHOLHDL,LDLDIRECT in the last 72 hours Thyroid Function Tests:  Basename 08/31/11 0200  TSH 3.456  T4TOTAL --  T3FREE --  THYROIDAB --   Anemia Panel: No results found for this basename: VITAMINB12,FOLATE,FERRITIN,TIBC,IRON,RETICCTPCT in the last 72 hours  RADIOLOGY: Dg Chest Port 1 View  08/29/2011  *RADIOLOGY REPORT*  Clinical Data: Nausea, vomiting, weakness  PORTABLE CHEST - 1 VIEW  Comparison: 04/20/2011  Findings: Stable cardiomegaly.  Perihilar and bibasilar interstitial and airspace edema or infiltrates   slightly increased since previous study.  No definite effusion.  Regional bones unremarkable.  IMPRESSION:  1.  Stable cardiomegaly with some increase in bilateral edema/infiltrates.  Original Report Authenticated By: Osa Craver, M.D.    PHYSICAL EXAM General: Bipap, mild tachypnea Neck: Thick, JVP 9-10 cm, no thyromegaly or thyroid nodule.  Lungs: Bilateral expiratory wheezes CV: Nondisplaced PMI.  Heart regular S1/S2, no S3/S4, no murmur.  1+ bilateral ankle edema.  No carotid bruit.  Abdomen: Soft, nontender, no hepatosplenomegaly, no distention.  Neurologic: Sedated, oriented to place Psych: Normal affect. Extremities: No clubbing or cyanosis.   ASSESSMENT AND PLAN:  66 yo with history of CAD, diastolic CHF, OHS and OSA admitted with dyspnea/wheezing, hypercarbic respiratory failure. 1. Pulmonary: Hypercarbic respiratory  failure.   At baseline, she wears home O2 during the day for OHS and has OSA but did not tolerate CPAP.  She continues to have wheezing.  I suspect that she has a respiratory infection (likely bronchitis) with decompensation of her baseline poor respiratory status.  [name] now afebrile. - On azithromycin, ceftriaxone stopped. - O2 during the day, CPAP at night (trying to find a CPAP mask she can tolerate) - Albuterol/atrovent nebs, inhaled steroids 2. CAD: h/o PCI to CFX.  Continue ASA, Plavix, statin.  Beta blocker changed to bisoprolol with wheezing.  TnI mildly elevated with normal CKMB in the setting of severe hypoglycemia with syncope as well as respiratory failure.  No chest pain. I am inclined to think that this is demand ischemia.  Probable functional study when recovered from current episode.  3. CHF: Has significant diastolic CHF, on Lasix at home.  Some pulmonary vascular congestion on CXR.  BNP is elevated.  I do not think that this is the root cause of her current decompensation, but she does appear to have some volume overload on exam.  With improved creatinine today, I am going to change Lasix to IV to try to get some fluid out of her.   Krista Wise 09/01/2011 8:36 AM

## 2011-09-01 NOTE — Progress Notes (Signed)
Krista Wise is a 66 y.o. female admitted on 08/29/2011 with syncopal event with hypoglycemia.  Developed dyspnea, lethargy, fever 102F and wheezing with respiratory acidosis and hypoxemia on ABG.  Transferred to SDU and PCCM consulted.  [name] followed by Dr. Shelle Iron as outpt for severe OSA, but intolerant of CPAP as outpt due to difficulty with mask fit.  Antibiotics: Rocephin 2/5>> Zithromax 2/5>>2/6  Tests/events: Echo 2/5>>EF 60%, mild LVH, diastolic dysfx, mild AR, mild MR, mild reduced RV systolic fx, PAS 61 mmHg  SUBJECTIVE: She tried using CPAP last night, but felt the mask was too big.  OBJECTIVE:  Blood pressure 139/81, pulse 63, temperature 98.3 F (36.8 C), temperature source Oral, resp. rate 18, height 5\' 5"  (1.651 m), weight 267 lb 6.4 oz (121.292 kg), SpO2 80.00%. Wt Readings from Last 3 Encounters:  09/01/11 267 lb 6.4 oz (121.292 kg)  08/01/11 262 lb (118.842 kg)  06/01/11 264 lb (119.75 kg)   Body mass index is 44.50 kg/(m^2).  I/O last 3 completed shifts: In: 1563 [P.O.:1260; I.V.:3; IV Piggyback:300] Out: 3150 [Urine:3150]  General - no distress HEENT - no sinus tenderness Cardiac - s1s2 no murmur Chest - no wheeze Abd - soft, nontender Ext - no edema Neuro - normal strength  CBC    Component Value Date/Time   WBC 5.9 08/31/2011 0904   RBC 4.03 08/31/2011 0904   HGB 10.9* 08/31/2011 0904   HCT 35.2* 08/31/2011 0904   PLT 189 08/31/2011 0904   MCV 87.3 08/31/2011 0904   MCH 27.0 08/31/2011 0904   MCHC 31.0 08/31/2011 0904   RDW 16.5* 08/31/2011 0904   LYMPHSABS 1.2 08/29/2011 1836   MONOABS 0.7 08/29/2011 1836   EOSABS 0.1 08/29/2011 1836   BASOSABS 0.0 08/29/2011 1836    BMET    Component Value Date/Time   NA 134* 09/01/2011 0550   K 4.5 09/01/2011 0550   CL 89* 09/01/2011 0550   CO2 33* 09/01/2011 0550   GLUCOSE 225* 09/01/2011 0550   BUN 29* 09/01/2011 0550   CREATININE 1.16* 09/01/2011 0550   CALCIUM 9.1 09/01/2011 0550   GFRNONAA 49* 09/01/2011 0550   GFRAA 57*  09/01/2011 0550    ABG    Component Value Date/Time   PHART 7.312* 08/30/2011 0834   PCO2ART 63.3* 08/30/2011 0834   PO2ART 55.5* 08/30/2011 0834   HCO3 31.1* 08/30/2011 0834   TCO2 33.0 08/30/2011 0834   O2SAT 87.6 08/30/2011 0834     ASSESSMENT/PLAN:  Acute on chronic hypoxic and hypercapnic respiratory failure in setting of OSA and probable OHS complicated by acute upper respiratory infection and reactive airway disease and secondary pulmonary hypertension.  OSA/OHS -explained to pt importance of compliance with CPAP and how this can affect her health particularly regarding cardiovascular disease -continue auto CPAP qhs; she is to try different mask 2/7 -titrate oxygen to keep SpO2 > 90% -?if she might need BPAP instead of CPAP  Acute bronchitis with reactive airway disease.  No prior hx of smoking. -D3/x zithromax -continue nebulizers with inhaled steroids for now>>re-assess needs as outpt with PFT's  Secondary pulmonary hypertension -would try to optimize her pulmonary therapy with PAP and supplemental oxygen before further investigating specific tx for pulmonary hypertension  Acute on chronic diastolic dysfunction -per primary team and cardiology  Jaree Dwight Pager:  [phone] 09/01/2011, 10:30 AM

## 2011-09-02 ENCOUNTER — Telehealth: Payer: Self-pay | Admitting: Cardiology

## 2011-09-02 DIAGNOSIS — I214 Non-ST elevation (NSTEMI) myocardial infarction: Secondary | ICD-10-CM

## 2011-09-02 DIAGNOSIS — R0609 Other forms of dyspnea: Secondary | ICD-10-CM

## 2011-09-02 DIAGNOSIS — R0989 Other specified symptoms and signs involving the circulatory and respiratory systems: Secondary | ICD-10-CM

## 2011-09-02 DIAGNOSIS — J9620 Acute and chronic respiratory failure, unspecified whether with hypoxia or hypercapnia: Secondary | ICD-10-CM

## 2011-09-02 DIAGNOSIS — J9801 Acute bronchospasm: Secondary | ICD-10-CM

## 2011-09-02 LAB — BASIC METABOLIC PANEL
BUN: 22 mg/dL (ref 6–23)
Calcium: 8.9 mg/dL (ref 8.4–10.5)
Chloride: 91 mEq/L — ABNORMAL LOW (ref 96–112)
Creatinine, Ser: 0.88 mg/dL (ref 0.50–1.10)
GFR calc non Af Amer: 68 mL/min — ABNORMAL LOW (ref >90)
Potassium: 3.6 mEq/L (ref 3.5–5.1)

## 2011-09-02 LAB — GLUCOSE, CAPILLARY

## 2011-09-02 MED ORDER — POTASSIUM CHLORIDE 20 MEQ/15ML (10%) PO LIQD
40.0000 meq | Freq: Once | ORAL | Status: AC
  Administered 2011-09-02: 40 meq via ORAL
  Filled 2011-09-02: qty 30

## 2011-09-02 MED ORDER — INSULIN GLARGINE 100 UNIT/ML ~~LOC~~ SOLN
30.0000 [IU] | Freq: Every day | SUBCUTANEOUS | Status: DC
  Administered 2011-09-02 – 2011-09-05 (×4): 30 [IU] via SUBCUTANEOUS
  Filled 2011-09-02 (×2): qty 3

## 2011-09-02 NOTE — Progress Notes (Signed)
   CARE MANAGEMENT NOTE 09/02/2011  Patient:  Krista Wise, Krista Wise   Account Number:  [id number]  Date Initiated:  09/02/2011  Documentation initiated by:  Donn Pierini  Subjective/Objective Assessment:   Pt admitted with CAD, Nstemi, syncope, pulm. edema     Action/Plan:   PTA pt lived at home with spouse, was assisted with ADLs, PT/OT evals ordered   Anticipated DC Date:  09/05/2011   Anticipated DC Plan:  SKILLED NURSING FACILITY  In-house referral  Clinical Social Worker      DC Planning Services  CM consult      Choice offered to / List presented to:             Status of service:  In process, will continue to follow Medicare Important Message given?   (If response is "NO", the following Medicare IM given date fields will be blank) Date Medicare IM given:   Date Additional Medicare IM given:    Discharge Disposition:    Per UR Regulation:    Comments:  PCP- pt needs a new PCP- info on [facility] given to daughter  09/02/11- 1500- Donn Pierini RN, BSN [phone] Spoke with pt at bedside- per conversation pt states that her PCP has moved and she needs a new PCP- [facility] number has been given to pt's daughter. Pt reports that she has home O2 and a home cpap. She states that she would like to use [facility] for [facility] if she where to d/c home. Pt gave permission to call and speak to daughter and husband regarding d/c plans. Call made to Ocie Cornfield (daughter)- [phone]- per conversation with daughter pt would not be safe at home at this time due to husband not well himself and there is no one else to provide 24 hr assistance at home. Family working on arranging somekind of assistance/caregiver but is not arranged at this time. Daughter would like info on private pay agenices and also life alert systems. This info left in room with pt. Call also made to pt's husband [name]- [phone]) who was at the urgent care himself- per conversation with him- confirmed that pt will not  have 24hr assistance at home- and he himself can not provide care at this time- would like for pt to go to [facility] for rehab before coiming home and also to give family time to set up caregivers for home. MD notified of change in d/c plans. CSW consulted for placement needs.

## 2011-09-02 NOTE — Progress Notes (Signed)
Pt's original d/c plan stated that pt's husband would be available 24/7 to assist this pt with needed adls.  Pt doing well but is not independent enough to be home alone at this time.  Now, husband will not be available to home with pt 24/7.  For this pt's safety, feel this pt would be better served with short term rehab/SNF dc to regain full independence before returning home.  Will continue to follow while on acute care. Tory Emerald, [place]   [phone]

## 2011-09-02 NOTE — Progress Notes (Signed)
Inpatient Diabetes Program Recommendations  AACE/ADA: New Consensus Statement on Inpatient Glycemic Control (2009)  Target Ranges:  Prepandial:   less than 140 mg/dL      Peak postprandial:   less than 180 mg/dL (1-2 hours)      Critically ill patients:  140 - 180 mg/dL   Reason for Visit: Hyperglycemia  Inpatient Diabetes Program Recommendations Insulin - Basal: Please iincrease Lantus to 30-40 units Insulin - Meal Coverage: Increase meal coverage to 6 units  Note: Pt takes as much as 50 units Humalog in the morning, 90 units in the evening, and 90 units Lantus at home.

## 2011-09-02 NOTE — Progress Notes (Signed)
Occupational Therapy Treatment Patient Details Name: Krista Wise MRN: [id number] DOB: 11/11/1945 Today's Date: 09/02/2011  OT Assessment/Plan OT Assessment/Plan Comments on Treatment Session: Pt was mildly confused during session about location but was oriented to self, situation and date today.   OT Plan: Discharge plan remains appropriate OT Frequency: Min 2X/week Follow Up Recommendations: Home health OT;Supervision/Assistance - 24 hour Equipment Recommended: None recommended by OT OT Goals Acute Rehab OT Goals OT Goal Formulation: With patient Time For Goal Achievement: 7 days ADL Goals Pt Will Perform Grooming: with set-up;with supervision;Standing at sink ADL Goal: Grooming - Progress: Progressing toward goals Pt Will Perform Lower Body Dressing: with set-up;with supervision;with adaptive equipment;Sit to stand from chair ADL Goal: Lower Body Dressing - Progress: Progressing toward goals Pt Will Transfer to Toilet: with set-up;with supervision;3-in-1;with DME;Ambulation ADL Goal: Toilet Transfer - Progress: Progressing toward goals  OT Treatment Precautions/Restrictions  Precautions Precautions: Fall Required Braces or Orthoses: No Restrictions Weight Bearing Restrictions: No   ADL ADL Grooming: Performed;Wash/dry hands;Teeth care;Supervision/safety Where Assessed - Grooming: Standing at sink Lower Body Dressing: Performed;Minimal assistance Lower Body Dressing Details (indicate cue type and reason): pt able to cross legs over to reach feet Where Assessed - Lower Body Dressing: Sit to stand from bed Toilet Transfer: Performed;Minimal assistance Toilet Transfer Details (indicate cue type and reason): min guard while turning in bathroom to sit secondary to O2 cord being in the way. Toilet Transfer Method: Proofreader: Raised toilet seat with arms (or 3-in-1 over toilet) Toileting - Clothing Manipulation: Simulated;Supervision/safety Where  Assessed - Glass blower/designer Manipulation: Standing Toileting - Hygiene: Performed;Supervision/safety Where Assessed - Toileting Hygiene: Sit on 3-in-1 or toilet Equipment Used: Rolling walker Ambulation Related to ADLs: Pt was min guard for all ambulation with verbal cues to stay inside the walker when walking. Pt tended to stand to the side of the walker. Mobility  Bed Mobility Bed Mobility: No Transfers Transfers: Yes Sit to Stand: 5: Supervision;From chair/3-in-1;With armrests;Other (comment) (cues to push up from chair not from walker.) Stand to Sit: 5: Supervision;With armrests;To chair/3-in-1 (cues to reach back to chair.) Exercises    End of Session OT - End of Session Activity Tolerance: Patient tolerated treatment well Patient left: in chair;with call bell in reach;Other (comment) (chair alarm on.) Nurse Communication: Mobility status for ambulation General Behavior During Session: [facility] for tasks performed Cognition: Impaired, at baseline  [name] [phone] 09/02/2011, 9:55 AM

## 2011-09-02 NOTE — Telephone Encounter (Signed)
Spoke with daughter and she would like to speak to MD regarding her mother.  Advised would forward to Dr Shirlee Latch

## 2011-09-02 NOTE — Telephone Encounter (Signed)
New Msg: pt daughter calling wanting to speak with md/nurse regarding pt in hospital. Pt daughter said she always misses MD when he is in hospital. Pt daughter wanted to speak with MD to get pt diagnosis. Please return pt daugther call to discuss further.

## 2011-09-02 NOTE — Progress Notes (Signed)
Patient ID: Krista Wise, female   DOB: Jun 14, 1946, 66 y.o.   MRN: [id number]     SUBJECTIVE:  Doing well this morning.  Breathing better, creatinine down.      . albuterol  2.5 mg Nebulization BID  . antiseptic oral rinse  15 mL Mouth Rinse BID  . aspirin EC  81 mg Oral Daily  . azithromycin  500 mg Intravenous Q24H  . bisoprolol  5 mg Oral Daily  . budesonide  0.25 mg Nebulization BID  . clopidogrel  75 mg Oral Q breakfast  . furosemide  40 mg Intravenous BID  . heparin  5,000 Units Subcutaneous Q8H  . insulin aspart  0-20 Units Subcutaneous TID WC  . insulin aspart  0-5 Units Subcutaneous QHS  . insulin aspart  6 Units Subcutaneous TID WC  . insulin glargine  20 Units Subcutaneous QHS  . ipratropium  0.5 mg Nebulization BID  . rosuvastatin  20 mg Oral Daily  . sodium chloride  3 mL Intravenous Q12H  . DISCONTD: albuterol  2.5 mg Nebulization Q6H  . DISCONTD: ipratropium  0.5 mg Nebulization Q6H      Filed Vitals:   09/01/11 2209 09/02/11 0644 09/02/11 0839 09/02/11 0949  BP:  115/57    Pulse: 63 65    Temp:  96.5 F (35.8 C)    TempSrc:  Oral    Resp: 21 22    Height:      Weight:      SpO2: 91% 92% 92% 93%    Intake/Output Summary (Last 24 hours) at 09/02/11 1314 Last data filed at 09/02/11 0300  Gross per 24 hour  Intake    720 ml  Output   2920 ml  Net  -2200 ml    LABS: Basic Metabolic Panel:  Basename 09/02/11 0635 09/01/11 0550  NA 137 134*  K 3.6 4.5  CL 91* 89*  CO2 33* 33*  GLUCOSE 184* 225*  BUN 22 29*  CREATININE 0.88 1.16*  CALCIUM 8.9 9.1  MG -- --  PHOS -- --   Liver Function Tests: No results found for this basename: AST:2,ALT:2,ALKPHOS:2,BILITOT:2,PROT:2,ALBUMIN:2 in the last 72 hours No results found for this basename: LIPASE:2,AMYLASE:2 in the last 72 hours CBC:  Basename 08/31/11 0904  WBC 5.9  NEUTROABS --  HGB 10.9*  HCT 35.2*  MCV 87.3  PLT 189   Cardiac Enzymes:  Basename 08/31/11 0251 08/30/11 1818  CKTOTAL  437* 112  CKMB 4.1* 2.0  CKMBINDEX -- --  TROPONINI 0.43* 0.45*  proBNP 3335  D-Dimer: No results found for this basename: DDIMER:2 in the last 72 hours Hemoglobin A1C: No results found for this basename: HGBA1C in the last 72 hours Fasting Lipid Panel: No results found for this basename: CHOL,HDL,LDLCALC,TRIG,CHOLHDL,LDLDIRECT in the last 72 hours Thyroid Function Tests:  Basename 08/31/11 0200  TSH 3.456  T4TOTAL --  T3FREE --  THYROIDAB --   Anemia Panel: No results found for this basename: VITAMINB12,FOLATE,FERRITIN,TIBC,IRON,RETICCTPCT in the last 72 hours  RADIOLOGY: Dg Chest Port 1 View  08/29/2011  *RADIOLOGY REPORT*  Clinical Data: Nausea, vomiting, weakness  PORTABLE CHEST - 1 VIEW  Comparison: 04/20/2011  Findings: Stable cardiomegaly.  Perihilar and bibasilar interstitial and airspace edema or infiltrates   slightly increased since previous study.  No definite effusion.  Regional bones unremarkable.  IMPRESSION:  1.  Stable cardiomegaly with some increase in bilateral edema/infiltrates.  Original Report Authenticated By: Osa Craver, M.D.    PHYSICAL EXAM General: Bipap,  mild tachypnea Neck: Thick, JVP 10 cm, no thyromegaly or thyroid nodule.  Lungs: Bilateral expiratory wheezes CV: Nondisplaced PMI.  Heart regular S1/S2, no S3/S4, no murmur.  1+ bilateral ankle edema.  No carotid bruit.  Abdomen: Soft, nontender, no hepatosplenomegaly, no distention.  Neurologic: Sedated, oriented to place Psych: Normal affect. Extremities: No clubbing or cyanosis.   ASSESSMENT AND PLAN:  66 yo with history of CAD, diastolic CHF, OHS and OSA admitted with dyspnea/wheezing, hypercarbic respiratory failure. 1. Pulmonary: Hypercarbic respiratory failure.   At baseline, she wears home O2 during the day for OHS and has OSA but did not tolerate CPAP.  I suspect that she has a respiratory infection (likely bronchitis) with reactive airways disease and decompensation of her  baseline poor respiratory status.  - On azithromycin, ceftriaxone stopped. - O2 during the day, CPAP at night (trying to find a CPAP mask she can tolerate) - Albuterol/atrovent nebs, inhaled steroids 2. CAD: h/o PCI to CFX.  Continue ASA, Plavix, statin.  Beta blocker changed to bisoprolol with wheezing.  TnI mildly elevated with normal CKMB in the setting of severe hypoglycemia with syncope as well as respiratory failure.  No chest pain. I am inclined to think that this is demand ischemia.  Probable functional study when recovered from current episode.  3. CHF: Has significant diastolic CHF, on Lasix at home.  Some pulmonary vascular congestion on CXR.  BNP is elevated.  [name] still volume overloaded.  [name] diuresing well but I think she will need a couple more days of IV Lasix.    [name] 09/02/2011 1:14 PM

## 2011-09-02 NOTE — Progress Notes (Signed)
Krista Wise is a 66 y.o. female admitted on 08/29/2011 with syncopal event with hypoglycemia.  Developed dyspnea, lethargy, fever 102F and wheezing with respiratory acidosis and hypoxemia on ABG.  Transferred to SDU and PCCM consulted.  [name] followed by Dr. Shelle Iron as outpt for severe OSA, but intolerant of CPAP as outpt due to difficulty with mask fit.  Antibiotics: Rocephin 2/5>> Zithromax 2/5>>2/6  Tests/events: Echo 2/5>>EF 60%, mild LVH, diastolic dysfx, mild AR, mild MR, mild reduced RV systolic fx, PAS 61 mmHg  SUBJECTIVE: Feels breathing better.  No much cough.  Nursing staff reports pt declined using CPAP overnight.  OBJECTIVE:  Blood pressure 115/57, pulse 65, temperature 96.5 F (35.8 C), temperature source Oral, resp. rate 22, height 5\' 5"  (1.651 m), weight 267 lb 6.4 oz (121.292 kg), SpO2 93.00%. Wt Readings from Last 3 Encounters:  09/01/11 267 lb 6.4 oz (121.292 kg)  08/01/11 262 lb (118.842 kg)  06/01/11 264 lb (119.75 kg)   Body mass index is 44.50 kg/(m^2).  I/O last 3 completed shifts: In: 1563 [P.O.:1560; I.V.:3] Out: 4220 [Urine:4220]  General - no distress HEENT - no sinus tenderness Cardiac - s1s2 no murmur Chest - no wheeze Abd - soft, nontender Ext - no edema Neuro - normal strength  CBC    Component Value Date/Time   WBC 5.9 08/31/2011 0904   RBC 4.03 08/31/2011 0904   HGB 10.9* 08/31/2011 0904   HCT 35.2* 08/31/2011 0904   PLT 189 08/31/2011 0904   MCV 87.3 08/31/2011 0904   MCH 27.0 08/31/2011 0904   MCHC 31.0 08/31/2011 0904   RDW 16.5* 08/31/2011 0904   LYMPHSABS 1.2 08/29/2011 1836   MONOABS 0.7 08/29/2011 1836   EOSABS 0.1 08/29/2011 1836   BASOSABS 0.0 08/29/2011 1836    BMET    Component Value Date/Time   NA 137 09/02/2011 0635   K 3.6 09/02/2011 0635   CL 91* 09/02/2011 0635   CO2 33* 09/02/2011 0635   GLUCOSE 184* 09/02/2011 0635   BUN 22 09/02/2011 0635   CREATININE 0.88 09/02/2011 0635   CALCIUM 8.9 09/02/2011 0635   GFRNONAA 68* 09/02/2011 0635   GFRAA  79* 09/02/2011 0635    ABG    Component Value Date/Time   PHART 7.312* 08/30/2011 0834   PCO2ART 63.3* 08/30/2011 0834   PO2ART 55.5* 08/30/2011 0834   HCO3 31.1* 08/30/2011 0834   TCO2 33.0 08/30/2011 0834   O2SAT 87.6 08/30/2011 0834     ASSESSMENT/PLAN:  Acute on chronic hypoxic and hypercapnic respiratory failure in setting of OSA and probable OHS complicated by acute upper respiratory infection and reactive airway disease and secondary pulmonary hypertension.  OSA/OHS -explained to pt importance of compliance with CPAP and how this can affect her health particularly regarding cardiovascular disease -titrate oxygen to keep SpO2 > 90% -?if she might need BPAP instead of CPAP>>will need to assess as outpt  Acute bronchitis with reactive airway disease.  No prior hx of smoking. -D4/x zithromax -continue nebulizers with inhaled steroids for now>>re-assess needs as outpt with PFT's  Secondary pulmonary hypertension -would try to optimize her pulmonary therapy with PAP and supplemental oxygen before further investigating specific tx for pulmonary hypertension  Acute on chronic diastolic dysfunction -per primary team and cardiology  Pulmonary status stable for hospital d/c.  PCCM can be available as needed over weekend>>please call if help needed.  If pt is in hospital still will f/u on Monday.  If she is discharged over the weekend, then she  will need to have hospital follow up visit schedule with Dr. Shelle Iron in 2 to 3 weeks.  Keni Elison Pager:  [phone] 09/02/2011, 11:02 AM

## 2011-09-02 NOTE — Progress Notes (Signed)
Subjective: Pt feels much better today.  When I walked into the room patient was off of oxygen and smiling talking in full sentences.  No acute issues overnight.  Objective: Filed Vitals:   09/02/11 1610 09/02/11 0839 09/02/11 0949 09/02/11 1338  BP: 115/57   131/74  Pulse: 65   61  Temp: 96.5 F (35.8 C)   98.3 F (36.8 C)  TempSrc: Oral   Oral  Resp: 22   18  Height:      Weight:      SpO2: 92% 92% 93% 93%   Weight change:   Intake/Output Summary (Last 24 hours) at 09/02/11 1831 Last data filed at 09/02/11 1700  Gross per 24 hour  Intake    960 ml  Output   3921 ml  Net  -2961 ml    General: Alert, awake, oriented x3, in no acute distress.  HEENT: No bruits, no goiter.  Heart: Regular rate and rhythm, without murmurs, rubs, gallops.  Lungs: Rhales bases> apices Abdomen: Soft, nontender, nondistended, positive bowel sounds.  Neuro: Grossly intact, nonfocal.   Lab Results:  Basename 09/02/11 0635 09/01/11 0550  NA 137 134*  K 3.6 4.5  CL 91* 89*  CO2 33* 33*  GLUCOSE 184* 225*  BUN 22 29*  CREATININE 0.88 1.16*  CALCIUM 8.9 9.1  MG -- --  PHOS -- --   No results found for this basename: AST:2,ALT:2,ALKPHOS:2,BILITOT:2,PROT:2,ALBUMIN:2 in the last 72 hours No results found for this basename: LIPASE:2,AMYLASE:2 in the last 72 hours  Basename 08/31/11 0904  WBC 5.9  NEUTROABS --  HGB 10.9*  HCT 35.2*  MCV 87.3  PLT 189    Basename 08/31/11 0251  CKTOTAL 437*  CKMB 4.1*  CKMBINDEX --  TROPONINI 0.43*   No components found with this basename: POCBNP:3 No results found for this basename: DDIMER:2 in the last 72 hours No results found for this basename: HGBA1C:2 in the last 72 hours No results found for this basename: CHOL:2,HDL:2,LDLCALC:2,TRIG:2,CHOLHDL:2,LDLDIRECT:2 in the last 72 hours  Basename 08/31/11 0200  TSH 3.456  T4TOTAL --  T3FREE --  THYROIDAB --   No results found for this basename:  VITAMINB12:2,FOLATE:2,FERRITIN:2,TIBC:2,IRON:2,RETICCTPCT:2 in the last 72 hours  Micro Results: Recent Results (from the past 240 hour(s))  MRSA PCR SCREENING     Status: Normal   Collection Time   08/30/11 10:32 AM      Component Value Range Status Comment   MRSA by PCR NEGATIVE  NEGATIVE  Final     Studies/Results: No results found.  Medications: I have reviewed the patient's current medications.  Acute on chronic hypoxic and hypercapnic respiratory failure in setting of OSA and probable OHS complicated by acute upper respiratory infection and reactive airway disease and secondary pulmonary hypertension.  OSA/OHS   Pulm is on board. Patient will need to have   Acute bronchitis with RAD   Will continue antibiotics, nebs and inhaled steroids. Have patient f/u with pulm for PFT's  Secondary Pulmonary htn: Pulm on board.   Acute on Chronic Diastolic dysfunction.   Stable at this juncture. Pt is on lasix, B blocker   HPL: Patient is on statin.   DM: At this point better controlled than yesterday. Will continue to observe cbg's and change insulin regimen according to diabetes coordinators recommendations.  Disposition:  Patient stable for d/c from pulmonology's standpoint.  Should we find placement over the weekend patient is to f/u with Dr. Shelle Iron in 2-3 weeks.      LOS: 4  days   Penny Pia M.D.  [facility] 09/02/2011, 6:31 PM

## 2011-09-02 NOTE — Progress Notes (Signed)
PT Note  I agree with OT note below. Unless pt has 24 hour assist at home she will need ST rehab/SNF as pt not safe to be alone at home.  Ivonne Andrew PT, DPT [phone]

## 2011-09-03 DIAGNOSIS — I2510 Atherosclerotic heart disease of native coronary artery without angina pectoris: Secondary | ICD-10-CM

## 2011-09-03 LAB — GLUCOSE, CAPILLARY
Glucose-Capillary: 185 mg/dL — ABNORMAL HIGH (ref 70–99)
Glucose-Capillary: 286 mg/dL — ABNORMAL HIGH (ref 70–99)

## 2011-09-03 LAB — BASIC METABOLIC PANEL WITH GFR
BUN: 21 mg/dL (ref 6–23)
CO2: 33 meq/L — ABNORMAL HIGH (ref 19–32)
Calcium: 9.3 mg/dL (ref 8.4–10.5)
Chloride: 92 meq/L — ABNORMAL LOW (ref 96–112)
Creatinine, Ser: 0.85 mg/dL (ref 0.50–1.10)
GFR calc Af Amer: 83 mL/min — ABNORMAL LOW
GFR calc non Af Amer: 71 mL/min — ABNORMAL LOW
Glucose, Bld: 194 mg/dL — ABNORMAL HIGH (ref 70–99)
Potassium: 3.2 meq/L — ABNORMAL LOW (ref 3.5–5.1)
Sodium: 137 meq/L (ref 135–145)

## 2011-09-03 NOTE — Progress Notes (Signed)
Placed pt. On CPAP auto titrate (Min: 5, Max: 12) via FFM (what pt. Wears at home). Pt. Is tolerating CPAP well at this time.

## 2011-09-03 NOTE — Progress Notes (Signed)
Subjective: Pt was resting comfortably when I walked into the room and was off supplemental oxygen.  No acute issues overnight.  Objective: Filed Vitals:   09/02/11 0949 09/02/11 1338 09/02/11 2125 09/03/11 0523  BP:  131/74 133/67 120/66  Pulse:  61 61 64  Temp:  98.3 F (36.8 C) 99.2 F (37.3 C) 98.5 F (36.9 C)  TempSrc:  Oral Oral Oral  Resp:  18 20 18   Height:      Weight:      SpO2: 93% 93% 92% 80%   Weight change:   Intake/Output Summary (Last 24 hours) at 09/03/11 0803 Last data filed at 09/02/11 2223  Gross per 24 hour  Intake    480 ml  Output   2651 ml  Net  -2171 ml    General: Alert, awake, oriented x3, in no acute distress.  HEENT: No bruits, no goiter.  Heart: Regular rate and rhythm, without murmurs, rubs, gallops.  Lungs: Clear to auscultation today Abdomen: Soft, nontender, nondistended, positive bowel sounds.  Neuro: Grossly intact, nonfocal.   Lab Results:  Basename 09/03/11 0555 09/02/11 0635  NA 137 137  K 3.2* 3.6  CL 92* 91*  CO2 33* 33*  GLUCOSE 194* 184*  BUN 21 22  CREATININE 0.85 0.88  CALCIUM 9.3 8.9  MG -- --  PHOS -- --   No results found for this basename: AST:2,ALT:2,ALKPHOS:2,BILITOT:2,PROT:2,ALBUMIN:2 in the last 72 hours No results found for this basename: LIPASE:2,AMYLASE:2 in the last 72 hours  Basename 08/31/11 0904  WBC 5.9  NEUTROABS --  HGB 10.9*  HCT 35.2*  MCV 87.3  PLT 189   No results found for this basename: CKTOTAL:3,CKMB:3,CKMBINDEX:3,TROPONINI:3 in the last 72 hours No components found with this basename: POCBNP:3 No results found for this basename: DDIMER:2 in the last 72 hours No results found for this basename: HGBA1C:2 in the last 72 hours No results found for this basename: CHOL:2,HDL:2,LDLCALC:2,TRIG:2,CHOLHDL:2,LDLDIRECT:2 in the last 72 hours No results found for this basename: TSH,T4TOTAL,FREET3,T3FREE,THYROIDAB in the last 72 hours No results found for this basename:  VITAMINB12:2,FOLATE:2,FERRITIN:2,TIBC:2,IRON:2,RETICCTPCT:2 in the last 72 hours  Micro Results: Recent Results (from the past 240 hour(s))  MRSA PCR SCREENING     Status: Normal   Collection Time   08/30/11 10:32 AM      Component Value Range Status Comment   MRSA by PCR NEGATIVE  NEGATIVE  Final     Studies/Results: No results found.  Medications: I have reviewed the patient's current medications.   Patient Active Hospital Problem List: Shortness of breath (10/06/2010) Resolving. Multifactorial likely related to Pulmonary hypertension, OSA, and recent Bronchitis.    Pulm has signed off.  Will try to find placement into SNF for this weekend.  Will discuss with case manager.  Bronchitis: Patient is on zithromax day 5.  Would consider discontinuing after todays dose.  Pt is afebrile and last wbc count WNL at 5.9.  OSA (obstructive sleep apnea) (05/06/2011) Pt not on CPAP at night.  Was trying to find proper fitting mask reportedly.  She will have to follow up as an outpatient.  HYPERTENSION, UNSPECIFIED (10/06/2010) Well controlled currently.  Given history of OSA this may be causing the elevated blood pressures.  Although today and off cpap patient's blood pressures have been normotensive.  Last BP 120/66.  Given improved creatinine would plan on discharging patient back on her Ace Inhibitor.  CAD (10/06/2010) Stable on ASA, plavix, statin, beta blocker.  Patient had stent placed in 2/08 at distal CFX.  Hyperlipidemia (10/21/2010) Stable patient is on statin.     LOS: 5 days   Penny Pia M.D.  [facility] 09/03/2011, 8:03 AM

## 2011-09-03 NOTE — Progress Notes (Signed)
Patient ID: Krista Wise, female   DOB: 03-Nov-1945, 66 y.o.   MRN: [id number]  SUBJECTIVE:  Doing well this morning.  Breathing better, creatinine down.      . albuterol  2.5 mg Nebulization BID  . antiseptic oral rinse  15 mL Mouth Rinse BID  . aspirin EC  81 mg Oral Daily  . azithromycin  500 mg Intravenous Q24H  . bisoprolol  5 mg Oral Daily  . budesonide  0.25 mg Nebulization BID  . clopidogrel  75 mg Oral Q breakfast  . furosemide  40 mg Intravenous BID  . heparin  5,000 Units Subcutaneous Q8H  . insulin aspart  0-20 Units Subcutaneous TID WC  . insulin aspart  0-5 Units Subcutaneous QHS  . insulin aspart  6 Units Subcutaneous TID WC  . insulin glargine  30 Units Subcutaneous QHS  . ipratropium  0.5 mg Nebulization BID  . potassium chloride  40 mEq Oral Once  . rosuvastatin  20 mg Oral Daily  . sodium chloride  3 mL Intravenous Q12H  . DISCONTD: insulin glargine  20 Units Subcutaneous QHS      Filed Vitals:   09/02/11 1338 09/02/11 2125 09/03/11 0523 09/03/11 0802  BP: 131/74 133/67 120/66   Pulse: 61 61 64   Temp: 98.3 F (36.8 C) 99.2 F (37.3 C) 98.5 F (36.9 C)   TempSrc: Oral Oral Oral   Resp: 18 20 18    Height:      Weight:      SpO2: 93% 92% 80% 90%    Intake/Output Summary (Last 24 hours) at 09/03/11 1009 Last data filed at 09/02/11 2223  Gross per 24 hour  Intake    480 ml  Output   2651 ml  Net  -2171 ml    LABS: Basic Metabolic Panel:  Basename 09/03/11 0555 09/02/11 0635  NA 137 137  K 3.2* 3.6  CL 92* 91*  CO2 33* 33*  GLUCOSE 194* 184*  BUN 21 22  CREATININE 0.85 0.88  CALCIUM 9.3 8.9  MG -- --  PHOS -- --    RADIOLOGY: Dg Chest Port 1 View  08/29/2011  *RADIOLOGY REPORT*  Clinical Data: Nausea, vomiting, weakness  PORTABLE CHEST - 1 VIEW  Comparison: 04/20/2011  Findings: Stable cardiomegaly.  Perihilar and bibasilar interstitial and airspace edema or infiltrates   slightly increased since previous study.  No definite effusion.   Regional bones unremarkable.  IMPRESSION:  1.  Stable cardiomegaly with some increase in bilateral edema/infiltrates.  Original Report Authenticated By: Osa Craver, M.D.    PHYSICAL EXAM General: Mild tachypnea Neck: Thick, JVP 10 cm, no thyromegaly or thyroid nodule.  Lungs: Bilateral expiratory wheezes CV: Nondisplaced PMI.  Heart regular S1/S2, no S3/S4, no murmur.  1+ bilateral ankle edema.  No carotid bruit.  Abdomen: Soft, nontender, no hepatosplenomegaly, no distention.  Neurologic: Sedated, oriented to place Psych: Normal affect. Extremities: No clubbing or cyanosis.   ASSESSMENT AND PLAN:  66 yo with history of CAD, diastolic CHF, OHS and OSA admitted with dyspnea/wheezing, hypercarbic respiratory failure. 1. Pulmonary: Hypercarbic respiratory failure.   At baseline, she wears home O2 during the day for OHS and has OSA but did not tolerate CPAP.  I suspect that she has a respiratory infection (likely bronchitis) with reactive airways disease and decompensation of her baseline poor respiratory status.  - On azithromycin, ceftriaxone stopped. - O2 during the day, CPAP at night (trying to find a CPAP mask she can tolerate) -  Albuterol/atrovent nebs, inhaled steroids 2. CAD: h/o PCI to CFX.  Continue ASA, Plavix, statin.  Beta blocker changed to bisoprolol with wheezing.  TnI mildly elevated with normal CKMB in the setting of severe hypoglycemia with syncope as well as respiratory failure.  No chest pain. I am inclined to think that this is demand ischemia.  Consider outpatient functional study 3. CHF: Has significant diastolic CHF, on Lasix at home.  Some pulmonary vascular congestion on CXR.  BNP is elevated.  [name] still volume overloaded.  Change to PO lasix in am  Disposition:  Primary service looking for SNF over weekend Charlton Haws 09/03/2011 10:09 AM

## 2011-09-03 NOTE — Progress Notes (Signed)
CSW received t.c from MD indicating Pt was medically ready for d/c today.  CSW spoke with Pt, provided bed offers, filed FL2 on chart for signature.  Pt to speak with husband to determine if husband can manage level of care needed at home.  Pt husband with recent virus and recovering from a surgery so she is not sure if 24 hour assistance can be provided.  Contact information for this CSW provided for Pt to f/u after speaking with husband. Once decision has been made, CSW will faciltate expidated placement if this is Pt choice.   MD informed. Milus Banister MSW,LCSW w/e Coverage [phone]

## 2011-09-04 DIAGNOSIS — I272 Pulmonary hypertension, unspecified: Secondary | ICD-10-CM | POA: Diagnosis present

## 2011-09-04 DIAGNOSIS — E876 Hypokalemia: Secondary | ICD-10-CM | POA: Diagnosis present

## 2011-09-04 DIAGNOSIS — R0902 Hypoxemia: Secondary | ICD-10-CM | POA: Diagnosis present

## 2011-09-04 LAB — GLUCOSE, CAPILLARY
Glucose-Capillary: 276 mg/dL — ABNORMAL HIGH (ref 70–99)
Glucose-Capillary: 280 mg/dL — ABNORMAL HIGH (ref 70–99)
Glucose-Capillary: 246 mg/dL — ABNORMAL HIGH (ref 70–99)
Glucose-Capillary: 288 mg/dL — ABNORMAL HIGH (ref 70–99)

## 2011-09-04 MED ORDER — FUROSEMIDE 40 MG PO TABS
40.0000 mg | ORAL_TABLET | Freq: Two times a day (BID) | ORAL | Status: DC
  Administered 2011-09-04 – 2011-09-05 (×2): 40 mg via ORAL
  Filled 2011-09-04 (×4): qty 1

## 2011-09-04 NOTE — Progress Notes (Signed)
Patient ID: Krista Wise, female   DOB: 1945/08/25, 66 y.o.   MRN: [id number]  SUBJECTIVE:  Doing well this morning.  No SOB     . albuterol  2.5 mg Nebulization BID  . antiseptic oral rinse  15 mL Mouth Rinse BID  . aspirin EC  81 mg Oral Daily  . azithromycin  500 mg Intravenous Q24H  . bisoprolol  5 mg Oral Daily  . budesonide  0.25 mg Nebulization BID  . clopidogrel  75 mg Oral Q breakfast  . furosemide  40 mg Oral BID  . heparin  5,000 Units Subcutaneous Q8H  . insulin aspart  0-20 Units Subcutaneous TID WC  . insulin aspart  0-5 Units Subcutaneous QHS  . insulin aspart  6 Units Subcutaneous TID WC  . insulin glargine  30 Units Subcutaneous QHS  . ipratropium  0.5 mg Nebulization BID  . rosuvastatin  20 mg Oral Daily  . sodium chloride  3 mL Intravenous Q12H  . DISCONTD: furosemide  40 mg Intravenous BID      Filed Vitals:   09/03/11 2038 09/03/11 2139 09/04/11 0634 09/04/11 0821  BP:  138/53 134/88   Pulse: 59 61 65   Temp:  98.1 F (36.7 C) 97.9 F (36.6 C)   TempSrc:  Oral Oral   Resp: 18 18 20    Height:      Weight:   118.8 kg (261 lb 14.5 oz)   SpO2: 95% 90% 90% 95%    Intake/Output Summary (Last 24 hours) at 09/04/11 1330 Last data filed at 09/04/11 1100  Gross per 24 hour  Intake      0 ml  Output   1600 ml  Net  -1600 ml    LABS: Basic Metabolic Panel:  Basename 09/03/11 0555 09/02/11 0635  NA 137 137  K 3.2* 3.6  CL 92* 91*  CO2 33* 33*  GLUCOSE 194* 184*  BUN 21 22  CREATININE 0.85 0.88  CALCIUM 9.3 8.9  MG -- --  PHOS -- --    RADIOLOGY: Dg Chest Port 1 View  08/29/2011  *RADIOLOGY REPORT*  Clinical Data: Nausea, vomiting, weakness  PORTABLE CHEST - 1 VIEW  Comparison: 04/20/2011  Findings: Stable cardiomegaly.  Perihilar and bibasilar interstitial and airspace edema or infiltrates   slightly increased since previous study.  No definite effusion.  Regional bones unremarkable.  IMPRESSION:  1.  Stable cardiomegaly with some increase in  bilateral edema/infiltrates.  Original Report Authenticated By: Osa Craver, M.D.    PHYSICAL EXAM General: Mild tachypnea Neck: Thick, JVP 10 cm, no thyromegaly or thyroid nodule.  Lungs: Bilateral expiratory wheezes CV: Nondisplaced PMI.  Heart regular S1/S2, no S3/S4, no murmur.  1+ bilateral ankle edema.  No carotid bruit.  Abdomen: Soft, nontender, no hepatosplenomegaly, no distention.  Neurologic: Sedated, oriented to place Psych: Normal affect. Extremities: No clubbing or cyanosis.   ASSESSMENT AND PLAN:  66 yo with history of CAD, diastolic CHF, OHS and OSA admitted with dyspnea/wheezing, hypercarbic respiratory failure. 1. Pulmonary: Hypercarbic respiratory failure.   At baseline, she wears home O2 during the day for OHS and has OSA but did not tolerate CPAP.  I suspect that she has a respiratory infection (likely bronchitis) with reactive airways disease and decompensation of her baseline poor respiratory status.  - On azithromycin, ceftriaxone stopped. - O2 during the day, CPAP at night (trying to find a CPAP mask she can tolerate) - Albuterol/atrovent nebs, inhaled steroids 2. CAD: h/o PCI  to CFX.  Continue ASA, Plavix, statin.  Beta blocker changed to bisoprolol with wheezing.  TnI mildly elevated with normal CKMB in the setting of severe hypoglycemia with syncope as well as respiratory failure.  No chest pain. I am inclined to think that this is demand ischemia.  Consider outpatient functional study 3. CHF: Has significant diastolic CHF, on Lasix at home.  Some pulmonary vascular congestion on CXR.  BNP is elevated.   Improved Oral diuretics resumed yesterday   Disposition:  Primary service looking for SNF over weekend Will sign off Charlton Haws 09/04/2011 1:30 PM

## 2011-09-04 NOTE — Progress Notes (Signed)
Subjective: Patient at this point feels better.  Still having some SOB but has been resolving.  Denies any chest pain or productive cough.  No acute issue overnight.  Objective: Filed Vitals:   09/03/11 2038 09/03/11 2139 09/04/11 0634 09/04/11 0821  BP:  138/53 134/88   Pulse: 59 61 65   Temp:  98.1 F (36.7 C) 97.9 F (36.6 C)   TempSrc:  Oral Oral   Resp: 18 18 20    Height:      Weight:   118.8 kg (261 lb 14.5 oz)   SpO2: 95% 90% 90% 95%   Weight change:   Intake/Output Summary (Last 24 hours) at 09/04/11 1037 Last data filed at 09/03/11 1100  Gross per 24 hour  Intake    240 ml  Output      0 ml  Net    240 ml    General: Alert, awake, oriented x3, in no acute distress.  HEENT: No bruits, no goiter.  Heart: Regular rate and rhythm, without murmurs, rubs, gallops.  Lungs: Mild Rhales at bases no wheezes, No increased WOB Abdomen: Soft, nontender, nondistended, positive bowel sounds.  Neuro: Grossly intact, nonfocal.   Lab Results:  Basename 09/03/11 0555 09/02/11 0635  NA 137 137  K 3.2* 3.6  CL 92* 91*  CO2 33* 33*  GLUCOSE 194* 184*  BUN 21 22  CREATININE 0.85 0.88  CALCIUM 9.3 8.9  MG -- --  PHOS -- --   No results found for this basename: AST:2,ALT:2,ALKPHOS:2,BILITOT:2,PROT:2,ALBUMIN:2 in the last 72 hours No results found for this basename: LIPASE:2,AMYLASE:2 in the last 72 hours No results found for this basename: WBC:2,NEUTROABS:2,HGB:2,HCT:2,MCV:2,PLT:2 in the last 72 hours No results found for this basename: CKTOTAL:3,CKMB:3,CKMBINDEX:3,TROPONINI:3 in the last 72 hours No components found with this basename: POCBNP:3 No results found for this basename: DDIMER:2 in the last 72 hours No results found for this basename: HGBA1C:2 in the last 72 hours No results found for this basename: CHOL:2,HDL:2,LDLCALC:2,TRIG:2,CHOLHDL:2,LDLDIRECT:2 in the last 72 hours No results found for this basename: TSH,T4TOTAL,FREET3,T3FREE,THYROIDAB in the last 72  hours No results found for this basename: VITAMINB12:2,FOLATE:2,FERRITIN:2,TIBC:2,IRON:2,RETICCTPCT:2 in the last 72 hours  Micro Results: Recent Results (from the past 240 hour(s))  MRSA PCR SCREENING     Status: Normal   Collection Time   08/30/11 10:32 AM      Component Value Range Status Comment   MRSA by PCR NEGATIVE  NEGATIVE  Final     Studies/Results: No results found.  Medications: I have reviewed the patient's current medications.  Shortness of breath/Hypoxia (10/06/2010) Resolving. Multifactorial likely related to Pulmonary hypertension, OSA, and recent Bronchitis.  Pulm has signed off. Will try to find placement into SNF for this weekend. Will discuss with case manager.  Bronchitis: Patient is on zithromax day 6. Would consider discontinuing after todays dose. Pt is afebrile and last wbc count WNL at 5.9.   OSA (obstructive sleep apnea) (05/06/2011) Pt not on CPAP at night. Was trying to find proper fitting mask reportedly. She will have to follow up as an outpatient. Reportedly patient wears supplemental oxygen at night.  HYPERTENSION, UNSPECIFIED (10/06/2010) Well controlled currently. Given history of OSA this may be causing the elevated blood pressures. Although today and off cpap patient's blood pressures have been normotensive. Last BP 120/66. Given improved creatinine would plan on discharging patient back on her Ace Inhibitor.   CAD (10/06/2010) Stable on ASA, plavix, statin, beta blocker. Patient had stent placed in 2/08 at distal CFX.  Hyperlipidemia (10/21/2010) Stable patient is on statin.  Diastolic CHF:  Cardiology on board.  Have changed lasix to po home regimen per recommendations.   Disposition:  Social worker looking for placement for patient.  Patient is to go to SNF while recovering.  Is medically stable.      LOS: 6 days   Penny Pia M.D.  [facility] 09/04/2011, 10:37 AM

## 2011-09-05 ENCOUNTER — Telehealth: Payer: Self-pay | Admitting: Cardiology

## 2011-09-05 DIAGNOSIS — R0609 Other forms of dyspnea: Secondary | ICD-10-CM

## 2011-09-05 DIAGNOSIS — J9801 Acute bronchospasm: Secondary | ICD-10-CM

## 2011-09-05 DIAGNOSIS — J9620 Acute and chronic respiratory failure, unspecified whether with hypoxia or hypercapnia: Secondary | ICD-10-CM

## 2011-09-05 DIAGNOSIS — R0989 Other specified symptoms and signs involving the circulatory and respiratory systems: Secondary | ICD-10-CM

## 2011-09-05 LAB — GLUCOSE, CAPILLARY
Glucose-Capillary: 271 mg/dL — ABNORMAL HIGH (ref 70–99)
Glucose-Capillary: 271 mg/dL — ABNORMAL HIGH (ref 70–99)
Glucose-Capillary: 197 mg/dL — ABNORMAL HIGH (ref 70–99)

## 2011-09-05 LAB — BASIC METABOLIC PANEL
BUN: 16 mg/dL (ref 6–23)
CO2: 33 mEq/L — ABNORMAL HIGH (ref 19–32)
Calcium: 9.6 mg/dL (ref 8.4–10.5)
Chloride: 94 mEq/L — ABNORMAL LOW (ref 96–112)
Creatinine, Ser: 0.76 mg/dL (ref 0.50–1.10)

## 2011-09-05 MED ORDER — BISOPROLOL FUMARATE 5 MG PO TABS: 5.0000 mg | ORAL_TABLET | Freq: Every day | ORAL | Status: DC

## 2011-09-05 MED ORDER — INSULIN GLARGINE 100 UNIT/ML ~~LOC~~ SOLN: 35.0000 [IU] | Freq: Every day | SUBCUTANEOUS | Status: DC

## 2011-09-05 MED ORDER — FUROSEMIDE 40 MG PO TABS
60.0000 mg | ORAL_TABLET | Freq: Two times a day (BID) | ORAL | Status: DC
  Administered 2011-09-05 – 2011-09-06 (×2): 60 mg via ORAL
  Filled 2011-09-05 (×4): qty 1

## 2011-09-05 MED ORDER — FUROSEMIDE 20 MG PO TABS: 60.0000 mg | ORAL_TABLET | Freq: Two times a day (BID) | ORAL | Status: DC

## 2011-09-05 MED ORDER — LISINOPRIL 5 MG PO TABS
5.0000 mg | ORAL_TABLET | Freq: Every day | ORAL | Status: DC
  Administered 2011-09-05: 5 mg via ORAL
  Filled 2011-09-05 (×2): qty 1

## 2011-09-05 NOTE — Progress Notes (Signed)
Physical Therapy Treatment Patient Details Name: Krista Wise MRN: [id number] DOB: 1946-02-07 Today's Date: 09/05/2011  PT Assessment/Plan  PT - Assessment/Plan Comments on Treatment Session: [name] did much better today than on eval today. Able to ambulate 250 ft with RW and minA-mingaurdA. Pt is adamantly refusing SNF. I did a lot of education with her and her son about her risk of falls and her need for supervision as well as the reality of the situation concerning her ability to care for her husband full time. Ed pt on self monitoring and sitting down before she gets so winded that she becomes rushed and at higher risk of falls. Pt agreeable to HHPT. Still rec. 24 hour assist as pt has moments of confusion (which she completely denies although on eval she didn't know where she was). Son appears aware of my concern and agreeable to upping the amount of assist at home for her and her husband.  PT Plan: Frequency remains appropriate;Discharge plan remains appropriate Follow Up Recommendations: Supervision/Assistance - 24 hour;Home health PT vs SNF (if 24 not avail) Equipment Recommended: Rolling walker with 5" wheels PT Goals  Acute Rehab PT Goals PT Goal: Sit to Stand - Progress: Progressing toward goal PT Goal: Stand to Sit - Progress: Progressing toward goal PT Transfer Goal: Bed to Chair/Chair to Bed - Progress: Progressing toward goal PT Goal: Ambulate - Progress: Progressing toward goal  PT Treatment Precautions/Restrictions  Precautions Precautions: Fall Required Braces or Orthoses: No Restrictions Weight Bearing Restrictions: No Mobility (including Balance) Bed Mobility Bed Mobility: No (pt upright in chair) Transfers Sit to Stand: 5: Supervision;From chair/3-in-1;With armrests Stand to Sit: 5: Supervision;With armrests;To chair/3-in-1 Stand to Sit Details: cues for safe technique Ambulation/Gait Ambulation/Gait Assistance Details (indicate cue type and reason): amb  approx 250 ft with RW min-mingaurdA with cues for upright posture and safe technique with RW; pt also needing occasional v/c's for self monitoring as SaO2 began to drop (it remained >/=90% on 2 L throughout walking but fluctuated between 97%-90%) Ambulation Distance (Feet): 250 Feet Assistive device: Rolling walker Gait Pattern: Trunk flexed  Posture/Postural Control Posture/Postural Control: No significant limitations Exercise    End of Session PT - End of Session Equipment Utilized During Treatment: Gait belt Activity Tolerance: Patient tolerated treatment well Patient left: in chair Nurse Communication: Mobility status for transfers;Mobility status for ambulation General Behavior During Session: [facility] for tasks performed Cognition: Impaired Cognitive Impairment: pt with decreased safety awareness and awareness of her need for assist  [facility] [name] 09/05/2011, 12:52 PM

## 2011-09-05 NOTE — Telephone Encounter (Signed)
FU Call: pt husband calling wanting to speak with MD regarding intentions of D/C pt. Pt husband is sick and needs to line up in-home care and wanted to discuss with MD possible d/c dates/plans for pt. Please return pt husband call to discuss further.

## 2011-09-05 NOTE — Telephone Encounter (Signed)
Dr Shirlee Latch talked with pt and family at hospital today

## 2011-09-05 NOTE — Discharge Summary (Addendum)
Admit date: 08/29/2011 Discharge date: 09/05/2011  Primary Care Physician:  [name], NP, NP   Discharge Diagnoses:   No resolved problems to display.  Active Hospital Problems  Diagnoses Date Noted   . Shortness of breath 10/06/2010     Priority: Medium  . Hypoxia 09/04/2011     Priority: High  . Hypokalemia 09/04/2011     Priority: Medium  . Pulmonary HTN 09/04/2011     Priority: Medium  . OSA (obstructive sleep apnea) 05/06/2011     Priority: Medium  . Hyperlipidemia 10/21/2010   . HYPERTENSION, UNSPECIFIED 10/06/2010   . CAD 10/06/2010     Resolved Hospital Problems  Diagnoses Date Noted Date Resolved     DISCHARGE MEDICATION: Medication List  As of 09/05/2011 12:03 PM   STOP taking these medications         carvedilol 12.5 MG tablet      glipiZIDE 5 MG tablet      insulin aspart 100 UNIT/ML injection      insulin lispro 100 UNIT/ML injection         TAKE these medications         amLODipine 5 MG tablet   Commonly known as: NORVASC   Take 5 mg by mouth daily.      aspirin EC 81 MG tablet   Take 1 tablet (81 mg total) by mouth daily.      atorvastatin 40 MG tablet   Commonly known as: LIPITOR   Take 40 mg by mouth daily.      bisoprolol 5 MG tablet   Commonly known as: ZEBETA   Take 1 tablet (5 mg total) by mouth daily.      buPROPion 150 MG 12 hr tablet   Commonly known as: WELLBUTRIN SR   Take 150 mg by mouth 2 (two) times daily.      clopidogrel 75 MG tablet   Commonly known as: PLAVIX   Take 75 mg by mouth daily.      furosemide 20 MG tablet   Commonly known as: LASIX   Take 3 tablets (60 mg total) by mouth 2 (two) times daily.      insulin glargine 100 UNIT/ML injection   Commonly known as: LANTUS   Inject 35 Units into the skin at bedtime.      lisinopril 20 MG tablet   Commonly known as: PRINIVIL,ZESTRIL   Take 40 mg by mouth at bedtime.      meclizine 12.5 MG tablet   Commonly known as: ANTIVERT   Take 12.5 mg by mouth 3  (three) times daily as needed. For nausea.      metFORMIN 1000 MG tablet   Commonly known as: GLUCOPHAGE   Take 1,000 mg by mouth 2 (two) times daily.      multivitamin tablet   Take 1 tablet by mouth daily.      potassium chloride SA 20 MEQ tablet   Commonly known as: K-DUR,KLOR-CON   Take 20 mEq by mouth 2 (two) times daily.      pregabalin 75 MG capsule   Commonly known as: LYRICA   Take 75 mg by mouth 2 (two) times daily.      PROAIR HFA 108 (90 BASE) MCG/ACT inhaler   Generic drug: albuterol   Inhale 2 puffs into the lungs every 6 (six) hours as needed. For shortness of breath.      rOPINIRole 2 MG tablet   Commonly known as: REQUIP   Take 2 mg by mouth  daily.      simvastatin 40 MG tablet   Commonly known as: ZOCOR   Take 40 mg by mouth every evening.      traMADol 50 MG tablet   Commonly known as: ULTRAM   Take 50-100 mg by mouth 2 (two) times daily as needed. For pain.      traZODone 50 MG tablet   Commonly known as: DESYREL   Take 50-100 mg by mouth at bedtime as needed. For sleep.      zolpidem 10 MG tablet   Commonly known as: AMBIEN   Take 10 mg by mouth at bedtime as needed. For sleep.              Consults: Treatment Team:  Rounding [facility], MD Coralyn Helling, MD   SIGNIFICANT DIAGNOSTIC STUDIES:  Dg Chest Port 1 View  08/30/2011  *RADIOLOGY REPORT*  Clinical Data: Dyspnea  PORTABLE CHEST - 1 VIEW  Comparison: Portable exam 0927 hours compared to 08/29/2011  Findings: Rotated to the left. Enlargement of cardiac silhouette. Pulmonary vascular congestion. Mediastinal contours normal for degree of rotation. Prominence of the left hilum, question related to prominent pulmonary vascular structures, unchanged. Bibasilar atelectasis. Question minimal perihilar edema, unchanged. No pleural effusion or pneumothorax. No acute osseous findings.  IMPRESSION: Enlargement of cardiac silhouette with pulmonary vascular congestion and question minimal chronic  perihilar edema. Mild bibasilar atelectasis.  Original Report Authenticated By: Lollie Marrow, M.D.   Dg Chest Port 1 View  08/29/2011  *RADIOLOGY REPORT*  Clinical Data: Nausea, vomiting, weakness  PORTABLE CHEST - 1 VIEW  Comparison: 04/20/2011  Findings: Stable cardiomegaly.  Perihilar and bibasilar interstitial and airspace edema or infiltrates   slightly increased since previous study.  No definite effusion.  Regional bones unremarkable.  IMPRESSION:  1.  Stable cardiomegaly with some increase in bilateral edema/infiltrates.  Original Report Authenticated By: Osa Craver, M.D.     ECHO:Study Conclusions (08/30/11)  - Left ventricle: Technically difficult study. The cavity size was normal. Wall thickness was increased in a pattern of mild LVH. The estimated ejection fraction was 60%. Regional wall motion abnormalities cannot be excluded. Findings consistent with left ventricular diastolic dysfunction. Doppler parameters are consistent with high ventricular filling pressure. - Aortic valve: Sclerosis without stenosis. Mild regurgitation. - Mitral valve: Mild regurgitation. - Left atrium: The atrium was mildly dilated. - Right ventricle: The cavity size was mildly dilated. Systolic function was mildly reduced. - Pulmonary arteries: PA peak pressure: 61mm Hg (S). Transthoracic echocardiography. M-mode, complete 2D, spectral Doppler, and color Doppler. Height: Height: 165.1cm. Height: 65in. Weight: Weight: 123.2kg. Weight: 271lb. Body mass index: BMI: 45.2kg/m^2. Body surface area: BSA: 2.15m^2. Blood pressure: 122/83. Patient status: Inpatient. Location: ICU/CCU        CARDIAC CATH & OTHER PROCEDURES:   Recent Results (from the past 240 hour(s))  MRSA PCR SCREENING     Status: Normal   Collection Time   08/30/11 10:32 AM      Component Value Range Status Comment   MRSA by PCR NEGATIVE  NEGATIVE  Final     BRIEF ADMITTING H & P: 66 yo wf with PMH of OSA/OHS seen by  Dr Shelle Iron in Oct 2012 and intolerant to CPAP. Admitted by [facility] after hypoglycemic syncopal episode. On hospital day one patient was found to be more dyspneic and lethargic with audible wheezes and was transferred to SDU.  Was thought to be secondary to OHS/OSA which was exacerbated by what was thought to be underlying  bronchitis.  She was treated with Azithromycin subsequently and after completing a full course as well as supplemental oxygen at night and lasix patient's respiratory condition improved.  Cardiology (Dr. [name])  has been following patient and recommended continuation of plavix, ASA, Statin, lasix, and low dose lisinopril.  Also recommended outpatient myoview.  Physical therapy initially made recommendations for patient to go home with SNF or home with Physical therapy if patient has 24 hour supervision.  After speaking to patient's husband he indicates that he won't be able to supervise her for 24 hours.  Patient is medically competent and refuses to go to SNF.  She states that she will have family help her at home.  I have recommended that patient go to SNF but patient wishes to go home and is agreeable to taking the risks involved with this decision.  I will order home physical therapy, occupational therapy, and Nursing to help with home medication administration.  Patient will need to follow up with her pulmonologist for OSA to help her find a proper fitting mask for Cpap.  [name] to wear supplemental oxygen at home of which she reportedly has at home.  For hypokalemia will have patient continue her K dur and she is to follow up with her primary care physician for further monitoring.  Patient is aware and agreeable.  DM:  At this point will discharge patient on 35 Units of Lantus Qhs and metformin.  No resolved problems to display.  Active Hospital Problems  Diagnoses Date Noted   . Shortness of breath 10/06/2010     Priority: Medium  . Hypoxia 09/04/2011     Priority: High    . Hypokalemia 09/04/2011     Priority: Medium  . Pulmonary HTN 09/04/2011     Priority: Medium  . OSA (obstructive sleep apnea) 05/06/2011     Priority: Medium  . Hyperlipidemia 10/21/2010   . HYPERTENSION, UNSPECIFIED 10/06/2010   . CAD 10/06/2010     Resolved Hospital Problems  Diagnoses Date Noted Date Resolved     Disposition and Follow-up: follow up with primary care physician in < 1 week. Discharge Orders    Future Appointments: Provider: Department: Dept Phone: Center:   09/23/2011 9:45 AM Barbaraann Share, MD [facility] [phone] None     Future Orders Please Complete By Expires   Diet - low sodium heart healthy      Increase activity slowly      Discharge instructions      Comments:   Follow up with primary care physician in < 1 week to check blood sugar levels and to check BMP.  Also follow up with cardiology in 1-2 weeks Follow up with pulmonology in 1-2 weeks.   Call MD for:  persistant nausea and vomiting      Call MD for:  redness, tenderness, or signs of infection (pain, swelling, redness, odor or green/yellow discharge around incision site)      Call MD for:  persistant dizziness or light-headedness      Call MD for:  extreme fatigue        Follow-up Information    Follow up with Barbaraann Share, MD on 09/23/2011. (@ 9:45 am)    Contact information:   [address] [employer] [facility] [place] [address] [phone]           DISCHARGE EXAM:   General: Alert, awake, oriented x3, in no acute distress. HEENT: atraumatic, normocephalic, EOMI, PERRLA No bruits,  no goiter. Heart: Regular rate and rhythm, without murmurs, rubs, gallops. Lungs: mild rhales bases> apices Abdomen: Soft, nontender, nondistended, positive bowel sounds. Extremities: No clubbing cyanosis or edema with positive pedal pulses. Neuro: Grossly intact, nonfocal. Skin: no diaphoresis, no obvious rashes    Blood pressure 127/59, pulse 57,  temperature 97.2 F (36.2 C), temperature source Oral, resp. rate 18, height 5\' 5"  (1.651 m), weight 118.8 kg (261 lb 14.5 oz), SpO2 91.00%.   Basename 09/03/11 0555  NA 137  K 3.2*  CL 92*  CO2 33*  GLUCOSE 194*  BUN 21  CREATININE 0.85  CALCIUM 9.3  MG --  PHOS --   No results found for this basename: AST:2,ALT:2,ALKPHOS:2,BILITOT:2,PROT:2,ALBUMIN:2 in the last 72 hours No results found for this basename: LIPASE:2,AMYLASE:2 in the last 72 hours No results found for this basename: WBC:2,NEUTROABS:2,HGB:2,HCT:2,MCV:2,PLT:2 in the last 72 hours  Signed: Penny Pia M.D. 09/05/2011, 12:03 PM  Addendum 09/06/11  Per patient she had multiple difficulties getting home yesterday.  Initially was transporation issues but subsequently after we came up with a solution for transportation to home her husband stated that she could not go home.  Thus patient stayed overnight.  I continue to recommend SNF as per PT/OT recommendations but patient does not want to go to SNF and still wishes to go home on D/C.  I have ordered home PT/OT and nursing given her decision to help her at home transiently.    Patient to be d/c'd today.  General: Alert, awake, oriented x3, in no acute distress. HEENT: neck supple, no goiter. CV: Regular rate, Blood pressure 125/57 Lungs: no audible wheezes Abdomen: nondistended Extremities: No clubbing cyanosis or edema  Neuro: Patient responds to questions appropriately and moves all extremities.  D/C today to home per patient with home PT/OT, Nursing.

## 2011-09-05 NOTE — Progress Notes (Signed)
CARE MANAGEMENT NOTE 09/05/2011  Patient:  Krista Wise, Krista Wise   Account Number:  [id number]  Date Initiated:  09/02/2011  Documentation initiated by:  Donn Pierini  Subjective/Objective Assessment:   Pt admitted with CAD, Nstemi, syncope, pulm. edema     Action/Plan:   PTA pt lived at home with spouse, was assisted with ADLs, PT/OT evals ordered   Anticipated DC Date:  09/05/2011   Anticipated DC Plan:  SKILLED NURSING FACILITY  In-house referral  Clinical Social Worker      DC Associate Professor  CM consult      [facility]  HOME HEALTH   Choice offered to / List presented to:  C-1 Patient        HH arranged  HH-1 RN  HH-2 PT  HH-3 OT      [facility] agency  [facility]   Status of service:  Completed, signed off Medicare Important Message given?   (If response is "NO", the following Medicare IM given date fields will be blank) Date Medicare IM given:   Date Additional Medicare IM given:    Discharge Disposition:  HOME W HOME HEALTH SERVICES  Per UR Regulation:  Reviewed for med. necessity/level of care/duration of stay  Comments:  PCP- pt needs a new PCP- info on [facility] Connect given to daughter  09/05/11- 1200- Donn Pierini RN,  BSN [phone] Updated plan is now to go home with [facility] per attending MD- per conversation with pt and husband they understand recommendations for SNF and take responsibility for going home with [facility]. Both pt and husband wish to use [facility] for [facility] services. Referral sent to [facility] for HH-RN/PT/OT via [facility] and also spoke with [facility] with [facility] regarding referral. Services to begin 24-48 hr. post discharge. Spoke with pt at bedside- per conversation pt states that she has all needed DME.  09/05/11- 1045- Donn Pierini RN, BSN [phone] Pt for discharge today to SNF, per pt she wants to go home with [facility]. Called and spoke with husband who states that he is not ready for pt to discharge today, explained to husband that pt is medically ready for discharge  today and insurance may not pay for continued stay past today. Husband again states that he is not ready for her to come home, although he would like her to go to rehab ST- he knows she wants to come home and he is working on making arrangements. MD notified of situation and is to call and speak with husband. After speaking with husband and pt- plan is to go to SNF per MD.  09/02/11- 1500- Donn Pierini RN, BSN [phone] Spoke with pt at bedside- per conversation pt states that her PCP has moved and she needs a new PCP- [facility] number has been given to pt's daughter. Pt reports that she has home O2 and a home cpap. She states that she would like to use [facility] for [facility] if she where to d/c home. Pt gave permission to call and speak to daughter and husband regarding d/c plans. Call made to Ocie Cornfield (daughter)- [phone]- per conversation with daughter pt would not be safe at home at this time due to husband not well himself and there is no one else to provide 24 hr assistance at home. Family working on arranging somekind of assistance/caregiver but is not arranged at this time. Daughter would like info on private pay agenices and also life alert systems. This info left in room with pt. Call also made to pt's  husband [name]) who was at the urgent care himself- per conversation with him- confirmed that pt will not have 24hr assistance at home- and he himself can not provide care at this time- would like for pt to go to ST[facility]SNF for rehab before coiming home and also to give family time to set up caregivers for home. MD notified of change in d/c plans. CSW consulted for placement needs.

## 2011-09-05 NOTE — Consult Note (Signed)
PULMONARY/CCM FOLLOW UP NOTE  Requesting MD/Service: Derry Skill Date of admission:  2/4 Date of consult: 2/5 Reason for consultation: hypercarbic resp failure  Pt Profile:  41 yowf with PMH of OSA/OHS seen by Dr Shelle Iron in Oct 2012 and intolerant to CPAP. Admitted by [facility] after hypoglycemic syncopal episode. Hosp day one found to be more dyspneic and lethargic with audible wheezes. ABG 7.31/63/56 on 2lpm [place]. Transferred to SDU and Pulm med asked to assist in mgmt. She has been intolerant to NPPV.  Overnight: Rested comfortably  Filed Vitals:   09/04/11 2136 09/05/11 0409 09/05/11 0736 09/05/11 1015  BP: 130/67 125/48  127/59  Pulse: 58 61 61 57  Temp: 97.3 F (36.3 C) 97.1 F (36.2 C)  97.2 F (36.2 C)  TempSrc: Oral Oral  Oral  Resp: 20 20 17 18   Height:      Weight:      SpO2: 91% 92% 93% 91%    EXAM:   Gen: awake and alert, comfortable HEENT: NCAT, PERRL, EOMi PULM: clear CV: RRR, no mgr Ab: soft, nontender, no hsm Ext: significant pitting edema Neuro: A&Ox4, maew   DATA: 2/5 CXR; CM, vasc congestion, minimal interstitial prominence  BMET    Component Value Date/Time   NA 137 09/03/2011 0555   K 3.2* 09/03/2011 0555   CL 92* 09/03/2011 0555   CO2 33* 09/03/2011 0555   GLUCOSE 194* 09/03/2011 0555   BUN 21 09/03/2011 0555   CREATININE 0.85 09/03/2011 0555   CALCIUM 9.3 09/03/2011 0555   GFRNONAA 71* 09/03/2011 0555   GFRAA 83* 09/03/2011 0555    CBC    Component Value Date/Time   WBC 5.9 08/31/2011 0904   RBC 4.03 08/31/2011 0904   HGB 10.9* 08/31/2011 0904   HCT 35.2* 08/31/2011 0904   PLT 189 08/31/2011 0904   MCV 87.3 08/31/2011 0904   MCH 27.0 08/31/2011 0904   MCHC 31.0 08/31/2011 0904   RDW 16.5* 08/31/2011 0904   LYMPHSABS 1.2 08/29/2011 1836   MONOABS 0.7 08/29/2011 1836   EOSABS 0.1 08/29/2011 1836   BASOSABS 0.0 08/29/2011 1836      IMPRESSION/PLAN:   Acute hypercarbic resp failure -likely chronic hypercarbia from obesity hypoventilation syndrome with concomitant OSA.   Unclear exactly why she decompensated but suspect decreased resp drive in setting of hypoglycemia led to worsening hypercarbia.  All this was likely made worse by underlying bronchitis and bronchospasm and possibly volume overload. Now Improved Recs:  -would continue pulmicort bid -continue duonebs -no evidence of pneumonia, maintain her on azithromycin for bronchitis complete 5 days total of ABX -gentle diuresis -continue to use CPAP at night as tolerated, her husband to bring in home machine -Oxygen 24 hours per day  PCCM will sign off. Please call if we can help you.   Anders Simmonds Pager [phone]  Levy Pupa, MD, PhD 09/05/2011, 2:55 PM [facility] [phone] or if no answer [phone]

## 2011-09-05 NOTE — Progress Notes (Signed)
Pt has been discharged, patient unable to obtain transportation home this afternoon, spoke with patients husband, he stated that the house was having a new filtration system added, heat would be off, windows open and fumigated, patient would not have a place to stay tonight.  Dr Cena Benton Made aware of situation. Will monitor patient. Kemauri Musa, Randall An RN

## 2011-09-05 NOTE — Telephone Encounter (Signed)
In Epic looks like referral was already made by Case Management at [facility]. I talked with pt's daughter, [name] and she is aware this has been done by Case Management at [facility]

## 2011-09-05 NOTE — Telephone Encounter (Signed)
New Problem   Patient daughter [name] [phone] would like a return call regarding home health referral for patient care.

## 2011-09-05 NOTE — Progress Notes (Addendum)
Patient ID: Krista Wise, female   DOB: 1945-12-22, 66 y.o.   MRN: [id number]      SUBJECTIVE:  Doing well this morning.  Breathing much better     . albuterol  2.5 mg Nebulization BID  . antiseptic oral rinse  15 mL Mouth Rinse BID  . aspirin EC  81 mg Oral Daily  . azithromycin  500 mg Intravenous Q24H  . bisoprolol  5 mg Oral Daily  . budesonide  0.25 mg Nebulization BID  . clopidogrel  75 mg Oral Q breakfast  . furosemide  40 mg Oral BID  . heparin  5,000 Units Subcutaneous Q8H  . insulin aspart  0-20 Units Subcutaneous TID WC  . insulin aspart  0-5 Units Subcutaneous QHS  . insulin aspart  6 Units Subcutaneous TID WC  . insulin glargine  30 Units Subcutaneous QHS  . ipratropium  0.5 mg Nebulization BID  . rosuvastatin  20 mg Oral Daily  . sodium chloride  3 mL Intravenous Q12H      Filed Vitals:   09/04/11 2136 09/05/11 0409 09/05/11 0736 09/05/11 1015  BP: 130/67 125/48  127/59  Pulse: 58 61 61 57  Temp: 97.3 F (36.3 C) 97.1 F (36.2 C)  97.2 F (36.2 C)  TempSrc: Oral Oral  Oral  Resp: 20 20 17 18   Height:      Weight:      SpO2: 91% 92% 93% 91%    Intake/Output Summary (Last 24 hours) at 09/05/11 1044 Last data filed at 09/05/11 0820  Gross per 24 hour  Intake    240 ml  Output   1600 ml  Net  -1360 ml    LABS: Basic Metabolic Panel:  Basename 09/03/11 0555  NA 137  K 3.2*  CL 92*  CO2 33*  GLUCOSE 194*  BUN 21  CREATININE 0.85  CALCIUM 9.3  MG --  PHOS --    RADIOLOGY: Dg Chest Port 1 View  08/29/2011  *RADIOLOGY REPORT*  Clinical Data: Nausea, vomiting, weakness  PORTABLE CHEST - 1 VIEW  Comparison: 04/20/2011  Findings: Stable cardiomegaly.  Perihilar and bibasilar interstitial and airspace edema or infiltrates   slightly increased since previous study.  No definite effusion.  Regional bones unremarkable.  IMPRESSION:  1.  Stable cardiomegaly with some increase in bilateral edema/infiltrates.  Original Report Authenticated By: Osa Craver, M.D.    PHYSICAL EXAM General: NAD Neck: Thick, JVP 7-8 cm, no thyromegaly or thyroid nodule.  Lungs: Very slight basilar crackles, no wheezes CV: Nondisplaced PMI.  Heart regular S1/S2, no S3/S4, no murmur.  Trace ankle edema.  No carotid bruit.  Abdomen: Soft, nontender, no hepatosplenomegaly, no distention.  Neurologic: Sedated, oriented to place Psych: Normal affect. Extremities: No clubbing or cyanosis.   ASSESSMENT AND PLAN:  66 yo with history of CAD, diastolic CHF, OHS and OSA admitted with dyspnea/wheezing, hypercarbic respiratory failure. 1. Pulmonary: Hypercarbic respiratory failure.   At baseline, she wears home O2 during the day for OHS and has OSA but did not tolerate CPAP.  I suspect that she has a respiratory infection (likely bronchitis) with reactive airways disease and decompensation of her baseline poor respiratory status.  - O2 during the day, CPAP at night - needs to make an effort with smaller CPAP mask. - Albuterol/atrovent nebs, inhaled steroids 2. CAD: h/o PCI to CFX.  Continue ASA, Plavix, statin.  Beta blocker changed to bisoprolol with wheezing.  TnI mildly elevated with normal CKMB in  the setting of severe hypoglycemia with syncope as well as respiratory failure.  No chest pain. I am inclined to think that this is demand ischemia.  Will arrange outpatient myoview. Can restart low dose lisinopril.  3. CHF: Acute on chronic diastolic CHF, resolved.  Weight is down 11 lbs.  I will increase her home Lasix dosing to 60 mg po bid.  4. Disposition: Plan for home with PT, OT, home health RN.  Family will help also.   [name] 09/05/2011 10:44 AM

## 2011-09-05 NOTE — Progress Notes (Signed)
Occupational Therapy Treatment Patient Details Name: Krista Wise MRN: [id number] DOB: 31-May-1946 Today's Date: 09/05/2011  OT Assessment/Plan OT Assessment/Plan Comments on Treatment Session: Pt. with intermittent confusion during session, however oriented x4. Pt. improving with mobility and able to recall energy conservation strategies. OT Plan: Discharge plan remains appropriate OT Frequency: Min 2X/week Follow Up Recommendations: Home health OT;Supervision/Assistance - 24 hour;Skilled nursing facility (if 24 hr not available) Equipment Recommended: None recommended by OT OT Goals Acute Rehab OT Goals OT Goal Formulation: With patient Time For Goal Achievement: 7 days ADL Goals Pt Will Perform Grooming: with set-up;with supervision;Standing at sink ADL Goal: Grooming - Progress: Met Pt Will Perform Lower Body Dressing: with set-up;with supervision;with adaptive equipment;Sit to stand from chair ADL Goal: Lower Body Dressing - Progress: Not met Pt Will Transfer to Toilet: with set-up;with supervision;3-in-1;with DME;Ambulation ADL Goal: Toilet Transfer - Progress: Met  OT Treatment Precautions/Restrictions  Precautions Precautions: Fall Required Braces or Orthoses: No   ADL ADL Grooming: Performed;Wash/dry hands;Teeth care;Supervision/safety Grooming Details (indicate cue type and reason): Min verbal cues to stand inside RW due to pt. attempting to stand outside BOS.  Where Assessed - Grooming: Standing at sink Upper Body Dressing: Performed;Set up Upper Body Dressing Details (indicate cue type and reason): with donning gown Where Assessed - Upper Body Dressing: Sitting, chair Toilet Transfer: Simulated;Supervision/safety Toilet Transfer Method: Ambulating Toilet Transfer Equipment: Other (comment) (recliner) Ambulation Related to ADLs: Pt. provided with close supervision due to decreased activity tolerance ADL Comments: Pt. educated on energy conservation techniques and  able to recall 2 techniques to increase safety with ADLs. Pt. educated on pursed lip breathing techniques to decrease SOB with activity.  Mobility  Bed Mobility Bed Mobility: No Transfers Transfers: Yes Sit to Stand: 5: Supervision;From chair/3-in-1;With armrests;Other (comment) Exercises    End of Session OT - End of Session Equipment Utilized During Treatment: Gait belt Activity Tolerance: Patient tolerated treatment well Patient left: in chair;with call bell in reach;Other (comment) Nurse Communication: Mobility status for ambulation General Behavior During Session: [facility] for tasks performed Cognition: Impaired, at baseline  Folasade Mooty, OTR/L Pager [phone]  09/05/2011, 9:44 AM

## 2011-09-05 NOTE — Progress Notes (Signed)
HOME HEALTH AGENCIES SERVING [place]   Agencies that are Medicare-Certified and are affiliated with [facility]  Telephone Number Address  [facility]   [facility] has ownership interest in this company; however, you are under no obligation to use this agency. [phone] or  [phone] [address] [place], [place] [address]   Agencies that are Medicare-Certified and are not affiliated with [facility]                                                                                 Home Health Agency Telephone Number Address  [facility] [phone] Fax [phone] [address] [place], [place]  [address]  [facility] [phone] or [phone] Fax [phone] [address] [place], [place] [address]  [facility] [phone] Fax [phone] [address] [place], [place] [address]  [facility] [phone] Fax [phone] [address] [place], [place]  [address]  [facility] [phone] Fax [phone] [address], [address] [place], [place] [address]  [facility] [phone] [address] [place], [place] [address]  [facility] [phone]  [address] [place], [place] [address]  [facility] [phone] or [phone] Fax [phone] [address] [place], [place]  [address]  [facility] [phone] Fax [phone] [address] [place], [place]  [address]  [facility]  [phone] Fax [phone] [address] [place], [place] [address]               Agencies that are not Medicare-Certified and are not affiliated with [facility]   Home Health Agency Telephone Number Address  [facility] [phone] or [phone] Fax [phone] [address] [place], [place]  [address]  [facility] [phone] Fax [phone] [address] [place], [place]  [address]  [facility]  [phone] Fax [phone] [address] [place], [place] [address]  [facility] [phone] Fax [phone] [address] [place], [place] [address]  [facility] [phone] or [phone] Fax [phone] [address] [place], [place]  [address]  [facility] [phone] or [phone] Fax [phone] [address] [place], [place]  [address]  [facility] [phone] Fax [phone] [address] [place], [place]  [address]  [facility] [phone] [address] [place], [place]  [address]  [facility] [phone] Fax [phone] [address] [place], [place]  [address]  [facility] [phone] Fax [phone] [address] [place], [place]  [address]  [facility] [phone] Fax [phone] [address] [place], [place] [address]  [facility] [phone] Fax [phone] [address] [place], [place]  [address]   

## 2011-09-06 LAB — BASIC METABOLIC PANEL
BUN: 18 mg/dL (ref 6–23)
CO2: 32 mEq/L (ref 19–32)
Calcium: 9.7 mg/dL (ref 8.4–10.5)
Chloride: 95 mEq/L — ABNORMAL LOW (ref 96–112)
Creatinine, Ser: 0.92 mg/dL (ref 0.50–1.10)
Glucose, Bld: 272 mg/dL — ABNORMAL HIGH (ref 70–99)

## 2011-09-06 LAB — GLUCOSE, CAPILLARY: Glucose-Capillary: 280 mg/dL — ABNORMAL HIGH (ref 70–99)

## 2011-09-06 MED ORDER — POTASSIUM CHLORIDE CRYS ER 20 MEQ PO TBCR: 20.0000 meq | EXTENDED_RELEASE_TABLET | Freq: Two times a day (BID) | ORAL | Status: DC

## 2011-09-06 NOTE — Progress Notes (Signed)
Pt. Refuses CPAP at this time.

## 2011-09-06 NOTE — Progress Notes (Signed)
Pt. Discharged 09/06/2011  9:26 AM Discharge instructions reviewed with patient/family. Patient/family verbalized understanding. All Rx's given. Questions answered as needed. Pt. Discharged to home with family/self. Taken off unit via W/C. Aubria Vanecek, [employer]

## 2011-09-15 ENCOUNTER — Encounter: Payer: Self-pay | Admitting: Cardiology

## 2011-09-15 ENCOUNTER — Ambulatory Visit (INDEPENDENT_AMBULATORY_CARE_PROVIDER_SITE_OTHER): Payer: Medicare Other | Admitting: Cardiology

## 2011-09-15 DIAGNOSIS — I5032 Chronic diastolic (congestive) heart failure: Secondary | ICD-10-CM

## 2011-09-15 DIAGNOSIS — I2510 Atherosclerotic heart disease of native coronary artery without angina pectoris: Secondary | ICD-10-CM

## 2011-09-15 DIAGNOSIS — R0902 Hypoxemia: Secondary | ICD-10-CM

## 2011-09-15 DIAGNOSIS — I509 Heart failure, unspecified: Secondary | ICD-10-CM

## 2011-09-15 DIAGNOSIS — E785 Hyperlipidemia, unspecified: Secondary | ICD-10-CM

## 2011-09-15 NOTE — Patient Instructions (Signed)
Your physician recommends that you return for a FASTING lipid profile /BMET/BNP 428.32  414.01   Your physician has requested that you have a lexiscan myoview. For further information please visit [email or web address] Please follow instruction sheet, as given.  Your physician recommends that you schedule a follow-up appointment in: 1 month with Dr Shirlee Latch.

## 2011-09-16 NOTE — Assessment & Plan Note (Signed)
Check lipids today with goal LDL < 70.  

## 2011-09-16 NOTE — Assessment & Plan Note (Signed)
Diastolic CHF s/p exacerbation.  Weight is back to below baseline.  She lost 11 lbs with diuresis in the hospital.  I have increased her baseline Lasix to 60 mg po bid.  Volume looks ok.  She still has NYHA class III symptoms, which I think is a mixture of CHF, OHS, and deconditioning.  Check BMET/BNP.

## 2011-09-16 NOTE — Assessment & Plan Note (Signed)
S/p PCI with DES x 2 to CFX.  She will continue Plavix for at least a year and likely long-term given multiple drug eluting stents.  Troponin was mildly elevated when she was in the hospital in 2/13.  I think this was likely due to demand ischemia but will get a Lexiscan myoview to make sure that there is no significant area of ischemia at risk.

## 2011-09-16 NOTE — Progress Notes (Signed)
PCP: [name]  66 yo with history of CAD, DM, HTN, OHS/OSA, and diastolic CHF presents for followup. She had unstable angina in 2/08 with a stent placed in her distal CFX. I saw her earlier this year due to severe exertional dyspnea (short of breath just walking around her house). She was volume overloaded on exam and hypertensive. I uptitrated her BP meds and started Lasix. She was set up for myoview and echo. The myoview was ischemic by both ECG and perfusion images. There was significant lateral ischemia. Echo showed preserved EF (55-60%), but there was moderate pulmonary hypertension suggesting likely diastolic LV dysfunction.  I took her to the cath lab in 4/12 where left heart cath showed 80-90% mid and distal circumflex stenoses before and after the prior-placed stent and a totally occluded moderate-sized OM1.  She received 2 drug eluting stents in the CFX.   In 2/13, she was admitted with hypercarbic respiratory failure requiring bipap.  She was thought to have acute bronchitis with reactive airways disease and decompensation of her baseline poor respiratory status.  She also had acute on chronic diastolic CHF.  Her beta blocker was changed to bisoprolol because of wheezing.  Mildly elevated troponin was thought to be due to demand ischemia.  She lost 11 lbs in the hospital with diuresis.  [name] feeling better now at home.  [name] walking in the house with her walker with mild dyspnea.  No chest pain.  Overall feels like she is back to baseline.  Her oxygen level is low today but she left her oxygen at home.  [name] wearing oxygen most of the time but is not using CPAP (it is not comfortable for her).  She has OT and PT coming to her house.    Labs (3/12): BNP 417 => 309, K 4.4, creatinine 0.7, HCT 44, free T3/T4 normal, TSH normal, HDL 38, LDL 96.5  Labs (5/12): BNP 422 => 160, K 3.9, creatinine 0.9, HDL 33, LDL 84 Labs (6/12): LDL 50, HDL 34, TGs 197 Labs (7/12): K 4.1, creatinine 1.1 Labs  (11/12): K 3.9, creatinine 1.4, BNP 91 Labs (2/13): K 3.6, creatinine 0.92  Allergies (verified):  No Known Drug Allergies   Family History:  Father: Deceased; MI in his 35s, CABG, CHF  Mother: Alive; HTN, rhematoid arthritis, anemic   Social History:  Married, lives with husband in [place]  disabled: diabetes & fibromyagia  Tobacco Use - No.  Alcohol Use - no  Regular Exercise - no   Past Medical History:  1. Hypertension  2. Depression  3. DM  4. Obesity  5. GERD  6. CAD: Unstable angina 2/08. PCI to distal codominant CFX with 2.5 x 20 Taxus DES. Lexiscan myoview (3/12): chest pain and dyspnea with stress, EF 62%, small fixed lateral perfusion defect with significant surrounding reversible perfusion defect suggestive of significant ischemia, ischemic ECG response.  LHC (4/12): 80-90% mid and distal CFX stenosis before and after prior-placed stent, total occlusion of a moderate 1st OM.  Patient had DES x 2 to CFX.   7. Fibromyalgia  8. Diastolic CHF: Echo (3/12) with EF 55-60%, mild LV hypertrophy, mild aortic stenosis (mean gradient 13 mmHg), mild MR, normal RV size and systolic function, PA systolic pressure 55 mmHg.  Echo (2/13): EF 60%, mild LVH, moderate diastolic dysfunction, mild AI, aortic sclerosis, mild MR, mild RV dilation and dysfunction, PASP 61 mmHg.  9. Aortic stenosis: Minimal by most recent echo.  10.  Diabetic retinopathy 11.  H/o vertigo 12.  Peripheral neuropathy likely from diabetes.  13. OHS/OSA: Severe OSA on sleep study.  Patient is off CPAP currently b/c she could not tolerate the mask.    Current Outpatient Prescriptions  Medication Sig Dispense Refill  . amLODipine (NORVASC) 5 MG tablet Take 5 mg by mouth daily.      Marland Kitchen aspirin EC 81 MG EC tablet Take 1 tablet (81 mg total) by mouth daily.      Marland Kitchen atorvastatin (LIPITOR) 40 MG tablet Take 40 mg by mouth daily.      . bisoprolol (ZEBETA) 5 MG tablet Take 1 tablet (5 mg total) by mouth daily.  30  tablet  0  . buPROPion (WELLBUTRIN SR) 150 MG 12 hr tablet Take 150 mg by mouth 2 (two) times daily.      . clopidogrel (PLAVIX) 75 MG tablet Take 75 mg by mouth daily.      . furosemide (LASIX) 20 MG tablet Take 3 tablets (60 mg total) by mouth 2 (two) times daily.  60 tablet  0  . insulin glargine (LANTUS) 100 UNIT/ML injection Inject 35 Units into the skin at bedtime.  10 mL  0  . lisinopril (PRINIVIL,ZESTRIL) 20 MG tablet Take 40 mg by mouth at bedtime.       . metFORMIN (GLUCOPHAGE) 1000 MG tablet Take 1,000 mg by mouth 2 (two) times daily.        . Multiple Vitamin (MULTIVITAMIN) tablet Take 1 tablet by mouth daily.        . potassium chloride SA (K-DUR,KLOR-CON) 20 MEQ tablet Take 20 mEq by mouth 2 (two) times daily.      . pregabalin (LYRICA) 75 MG capsule Take 75 mg by mouth 2 (two) times daily.        Marland Kitchen rOPINIRole (REQUIP) 2 MG tablet Take 2 mg by mouth daily.        . simvastatin (ZOCOR) 40 MG tablet Take 40 mg by mouth every evening.      . traZODone (DESYREL) 50 MG tablet Take 50-100 mg by mouth at bedtime as needed. For sleep.      Marland Kitchen zolpidem (AMBIEN) 10 MG tablet Take 10 mg by mouth at bedtime as needed. For sleep.      Marland Kitchen albuterol (PROAIR HFA) 108 (90 BASE) MCG/ACT inhaler Inhale 2 puffs into the lungs every 6 (six) hours as needed. For shortness of breath.      . meclizine (ANTIVERT) 12.5 MG tablet Take 12.5 mg by mouth 3 (three) times daily as needed. For nausea.      . traMADol (ULTRAM) 50 MG tablet Take 50-100 mg by mouth 2 (two) times daily as needed. For pain.        BP 104/58  Pulse 64  Ht 5\' 5"  (1.651 m)  Wt 261 lb (118.389 kg)  BMI 43.43 kg/m2  SpO2 86% General: Well developed, well nourished, in no acute distress. Obese.  Neck: Neck supple, JVP 7 cm. No masses, thyromegaly or abnormal cervical nodes.  Lungs: Clear bilaterally  Heart: Non-displaced PMI, chest non-tender; regular rate and rhythm, S1, S2 without rubs or gallops. 2/6 early systolic murmur RUSB.  Carotid upstroke normal, no bruit. Pedals normal pulses. No edema.  Abdomen: Bowel sounds positive; abdomen soft and non-tender without masses, organomegaly, or hernias noted. No hepatosplenomegaly.  Extremities: No clubbing or cyanosis.  Neurologic: Alert and oriented x 3.  Psych: Normal affect.

## 2011-09-16 NOTE — Assessment & Plan Note (Signed)
OHS/OSA.  [name] on home oxygen.  She has been unable to tolerate CPAP.  [name] seeing pulmonary on 3/1.

## 2011-09-23 ENCOUNTER — Ambulatory Visit (INDEPENDENT_AMBULATORY_CARE_PROVIDER_SITE_OTHER): Payer: Medicare Other | Admitting: Pulmonary Disease

## 2011-09-23 ENCOUNTER — Encounter: Payer: Self-pay | Admitting: Pulmonary Disease

## 2011-09-23 VITALS — BP 100/62 | HR 68 | Temp 98.4°F | Ht 65.0 in | Wt 255.8 lb

## 2011-09-23 DIAGNOSIS — G4733 Obstructive sleep apnea (adult) (pediatric): Secondary | ICD-10-CM

## 2011-09-23 NOTE — Progress Notes (Signed)
  Subjective:    Patient ID: Krista Wise, female    DOB: [birth year], 66 y.o.   MRN: [id number]  HPI The patient comes in today for followup of her obstructive sleep apnea.  She was initially seen in October of last year, and started on CPAP for severe OSA.  The patient never followed up at 5 weeks, and has been wearing CPAP noncompliantly.  She has been having trouble with claustrophobia from her mask, and may be having pressure issues as well.  She was recently in the hospital for hypoglycemia and questionable syncopal episode, and has not been wearing CPAP since home.   Review of Systems  Constitutional: Negative for fever and unexpected weight change.  HENT: Negative for ear pain, nosebleeds, congestion, sore throat, rhinorrhea, sneezing, trouble swallowing, dental problem, postnasal drip and sinus pressure.   Eyes: Negative for redness and itching.  Respiratory: Positive for cough, chest tightness and wheezing. Negative for shortness of breath.   Cardiovascular: Positive for leg swelling. Negative for palpitations.  Gastrointestinal: Negative for nausea and vomiting.  Genitourinary: Negative for dysuria.  Musculoskeletal: Positive for joint swelling.  Skin: Negative for rash.  Neurological: Positive for headaches.  Hematological: Bruises/bleeds easily.  Psychiatric/Behavioral: Negative for dysphoric mood. The patient is not nervous/anxious.        Objective:   Physical Exam Morbidly obese female in no acute distress No skin breakdown or pressure necrosis from the CPAP mask Lower extremities with edema, no cyanosis Chest fairly clear Alert, does not appear to be overly sleepy, moves all 4 extremities.        Assessment & Plan:

## 2011-09-23 NOTE — Assessment & Plan Note (Signed)
The patient has severe obstructive sleep apnea, and I've explained to her it is critical that she wears her CPAP device.  [name] not been able to tolerate a full face mask or nasal mask because of claustrophobia, but she is willing to wear nasal pillows.  I have also explained to her that we have yet to optimize her pressure, and we'll therefore take this opportunity to do so.  If she continues to have issues, I would be willing to try bilevel to see if it will help.  If claustrophobia even from nasal pillows continues to be a problem, I would suggest tracheostomy given her multiple medical problems.

## 2011-09-23 NOTE — Patient Instructions (Signed)
Will try nasal pillows to see if they are better tolerated. Wear cpap multiple times during the day when awake in family room for about at a time to get better adapted. Will have your dme make adjustments to your machine. followup with me in 8weeks to see how things are going with cpap Work on weight loss

## 2011-09-26 ENCOUNTER — Telehealth: Payer: Self-pay | Admitting: Cardiology

## 2011-09-26 NOTE — Telephone Encounter (Signed)
Talked with Amy. [name] requesting an order for a few more nurse visits to follow-up on CHF/COPD teaching. I have reviewed with Dr Shirlee Latch and given her this order.

## 2011-09-26 NOTE — Telephone Encounter (Signed)
New Msg: Amy from [facility] calling to speak with nurse/MD.   Calling to obtain further orders. Primary orders came from hospitalitis and order have expired.  Please return call to discuss further.

## 2011-09-26 NOTE — Telephone Encounter (Signed)
LMTCB

## 2011-09-26 NOTE — Telephone Encounter (Signed)
FU Call: Amy from [facility] calling back to speak with Krista Wise. Please return call to discuss further.

## 2011-09-27 ENCOUNTER — Other Ambulatory Visit: Payer: Medicare Other

## 2011-09-27 ENCOUNTER — Ambulatory Visit (HOSPITAL_COMMUNITY): Payer: Medicare Other

## 2011-09-28 ENCOUNTER — Encounter (HOSPITAL_COMMUNITY): Payer: Medicare Other

## 2011-09-30 ENCOUNTER — Ambulatory Visit: Payer: Medicare Other | Admitting: Cardiology

## 2011-09-30 ENCOUNTER — Telehealth: Payer: Self-pay | Admitting: Cardiology

## 2011-09-30 NOTE — Telephone Encounter (Signed)
Dr. Shirlee Latch reviewed and agreed okay to continue Bisoprolol [facility] nurse aware and will keep record of bp and hr. Mylo Red RN

## 2011-09-30 NOTE — Telephone Encounter (Signed)
Pt b/p is 104/52 HR is 52 and pt says she feels fine but nurse has a questions because she is gonna take her bisorprolol and should she take it with a HR of 50 and it is a 5mg  tab. They are holding medication until they hear back from our office

## 2011-10-05 ENCOUNTER — Telehealth: Payer: Self-pay | Admitting: *Deleted

## 2011-10-05 ENCOUNTER — Other Ambulatory Visit (INDEPENDENT_AMBULATORY_CARE_PROVIDER_SITE_OTHER): Payer: Medicare Other

## 2011-10-05 ENCOUNTER — Ambulatory Visit (HOSPITAL_COMMUNITY): Payer: Medicare Other | Attending: Cardiology | Admitting: Radiology

## 2011-10-05 VITALS — BP 107/51 | Ht 65.0 in | Wt 251.0 lb

## 2011-10-05 DIAGNOSIS — I10 Essential (primary) hypertension: Secondary | ICD-10-CM | POA: Insufficient documentation

## 2011-10-05 DIAGNOSIS — I5032 Chronic diastolic (congestive) heart failure: Secondary | ICD-10-CM

## 2011-10-05 DIAGNOSIS — I2510 Atherosclerotic heart disease of native coronary artery without angina pectoris: Secondary | ICD-10-CM

## 2011-10-05 DIAGNOSIS — R0602 Shortness of breath: Secondary | ICD-10-CM | POA: Insufficient documentation

## 2011-10-05 DIAGNOSIS — R0609 Other forms of dyspnea: Secondary | ICD-10-CM | POA: Insufficient documentation

## 2011-10-05 DIAGNOSIS — E875 Hyperkalemia: Secondary | ICD-10-CM

## 2011-10-05 DIAGNOSIS — I2789 Other specified pulmonary heart diseases: Secondary | ICD-10-CM | POA: Insufficient documentation

## 2011-10-05 DIAGNOSIS — R0989 Other specified symptoms and signs involving the circulatory and respiratory systems: Secondary | ICD-10-CM | POA: Insufficient documentation

## 2011-10-05 DIAGNOSIS — I509 Heart failure, unspecified: Secondary | ICD-10-CM

## 2011-10-05 DIAGNOSIS — E785 Hyperlipidemia, unspecified: Secondary | ICD-10-CM | POA: Insufficient documentation

## 2011-10-05 DIAGNOSIS — Z794 Long term (current) use of insulin: Secondary | ICD-10-CM | POA: Insufficient documentation

## 2011-10-05 DIAGNOSIS — Z8249 Family history of ischemic heart disease and other diseases of the circulatory system: Secondary | ICD-10-CM | POA: Insufficient documentation

## 2011-10-05 DIAGNOSIS — E119 Type 2 diabetes mellitus without complications: Secondary | ICD-10-CM | POA: Insufficient documentation

## 2011-10-05 DIAGNOSIS — E669 Obesity, unspecified: Secondary | ICD-10-CM | POA: Insufficient documentation

## 2011-10-05 DIAGNOSIS — R5381 Other malaise: Secondary | ICD-10-CM | POA: Insufficient documentation

## 2011-10-05 LAB — LIPID PANEL
Cholesterol: 131 mg/dL (ref 0–200)
HDL: 33.5 mg/dL — ABNORMAL LOW (ref >39.00)
Triglycerides: 335 mg/dL — ABNORMAL HIGH (ref 0.0–149.0)
VLDL: 67 mg/dL — ABNORMAL HIGH (ref 0.0–40.0)

## 2011-10-05 LAB — BASIC METABOLIC PANEL
BUN: 63 mg/dL — ABNORMAL HIGH (ref 6–23)
Creatinine, Ser: 2.2 mg/dL — ABNORMAL HIGH (ref 0.4–1.2)
GFR: 24.16 mL/min — ABNORMAL LOW (ref >60.00)
Glucose, Bld: 188 mg/dL — ABNORMAL HIGH (ref 70–99)
Potassium: 5.5 mEq/L — ABNORMAL HIGH (ref 3.5–5.1)

## 2011-10-05 MED ORDER — REGADENOSON 0.4 MG/5ML IV SOLN
0.4000 mg | Freq: Once | INTRAVENOUS | Status: AC
  Administered 2011-10-05: 0.4 mg via INTRAVENOUS

## 2011-10-05 MED ORDER — TECHNETIUM TC 99M TETROFOSMIN IV KIT
33.0000 | PACK | Freq: Once | INTRAVENOUS | Status: AC | PRN
  Administered 2011-10-05: 33 via INTRAVENOUS

## 2011-10-05 NOTE — Progress Notes (Addendum)
[facility] SITE 3 NUCLEAR MED [address] [place] [address] [phone]  Cardiology Nuclear Med Study  Krista Wise is a 66 y.o. female [id number] 11-May-1946   Nuclear Med Background Indication for Stress Test:  Evaluation for Ischemia, Stent Patency and 08/29/11 Post Hospital:Dyspnes/hypercapnea/wheezing on Bipap, mildly ^ troponins History: 3/12 Myocardial Perfusion Study: EF: 62% sm fixed lateral defect with reversible lateral ischemia, 4/12 Heart Cath: EF: 80-90% CFX Stenosis-CFX Restent x2, ECHO: EF: 60% mild AS, Pulmonary HTN,  LV dysfunction mild MR Cardiac Risk Factors: Family History - CAD, Hypertension, IDDM Type 2, Lipids and Obesity  Symptoms:  DOE, Fatigue and SOB   Nuclear Pre-Procedure Caffeine/Decaff Intake:  None NPO After: 8:00pm   Lungs:  clear IV 0.9% NS with Angio Cath:  20g  IV Site: R Antecubital  IV Started by:  Stanton Kidney, EMT-P  Chest Size (in):  44 Cup Size: D  Height: 5\' 5"  (1.651 m)  Weight:  251 lb (113.853 kg)  BMI:  Body mass index is 41.77 kg/(m^2). Tech Comments:  Meds taken as directed    Nuclear Med Study 1 or 2 day study: 2 day  Stress Test Type:  Eugenie Birks  Reading MD: [name], MD  Order Authorizing Provider:  D.McLean  Resting Radionuclide: Technetium 29m Tetrofosmin  Resting Radionuclide Dose: 33.0 mCi  10/05/11/  Stress Radionuclide:  Technetium 66m Tetrofosmin  Stress Radionuclide Dose: 33.0 mCi  10/06/11          Stress Protocol Rest HR: 60 Stress HR: 68  Rest BP: 107/51 Stress BP: 99/42  Exercise Time (min): n/a METS: n/a   Predicted Max HR: 157 bpm % Max HR: 43.31 bpm Rate Pressure Product: 6732   Dose of Adenosine (mg):  n/a Dose of Lexiscan: 0.4 mg  Dose of Atropine (mg): n/a Dose of Dobutamine: n/a mcg/kg/min (at max HR)  Stress Test Technologist: Milana Na, EMT-P  Nuclear Technologist:  Domenic Polite, CNMT     Rest Procedure:  Myocardial perfusion imaging was performed at rest  45 minutes following the intravenous administration of Technetium 80m Tetrofosmin. Rest ECG: NSR  Stress Procedure:  The patient received IV Lexiscan 0.4 mg over 15-seconds.  Technetium 33m Tetrofosmin injected at 30-seconds.  There were no significant changes and sob with Lexiscan.  Quantitative spect images were obtained after a 45 minute delay. Stress ECG: No significant ST segment change suggestive of ischemia.  QPS Raw Data Images:  Acquisition technically good; normal left ventricular size. Stress Images:  There is decreased uptake in the anterior and lateral walls. Rest Images:  There is decreased uptake in the anterior and lateral walls; lateral defect less prominent compared to the stress images. Subtraction (SDS):  These findings are consistent with soft tissue attenuation; there is also a prior lateral infarct with mild peri-infarct ischemia. Transient Ischemic Dilatation (Normal <1.22):  .95 Lung/Heart Ratio (Normal <0.45):  .26  Quantitative Gated Spect Images QGS EDV:  94 ml QGS ESV:  30 ml QGS cine images:  NL LV Function; NL Wall Motion QGS EF: 68%  Impression Exercise Capacity:  Lexiscan with no exercise. BP Response:  Normal blood pressure response. Clinical Symptoms:  There is dyspnea. ECG Impression:  No significant ST segment change suggestive of ischemia. Comparison with Prior Nuclear Study: Lateral ischemia is improved compared to previous  Overall Impression:  Abnormal stress nuclear study with a small, moderate severity, anterior, fixed defect suggestive of soft tissue attenuation; there is also a medium size, moderate, partially  reversible lateral defect consistent with prior lateral infarct and mild peri-infarct ischemia.   [name]   Probably improved from prior.  No cath.  Will see back in office as scheduled.   Dalton [employer]

## 2011-10-05 NOTE — Telephone Encounter (Signed)
Notes Recorded by Jacqlyn Krauss, RN on 10/05/2011 at 5:33 PM Per Dr Shirlee Latch. Stop KCL. Hold lasix for 3 days then decrease dose to 60mg  daily. Hold lisinopril for 3 days then decrease to 20mg  daily. BMET in 1 week. I discussed with pt and she verbalized understanding. She will return for [facility] 10/13/11. Preliminarily reviewed by Triage. Awaiting MD review and signature.

## 2011-10-06 ENCOUNTER — Ambulatory Visit (HOSPITAL_COMMUNITY): Payer: Medicare Other | Attending: Cardiology

## 2011-10-06 DIAGNOSIS — R0989 Other specified symptoms and signs involving the circulatory and respiratory systems: Secondary | ICD-10-CM

## 2011-10-06 MED ORDER — TECHNETIUM TC 99M TETROFOSMIN IV KIT
33.0000 | PACK | Freq: Once | INTRAVENOUS | Status: AC | PRN
  Administered 2011-10-06: 33 via INTRAVENOUS

## 2011-10-07 ENCOUNTER — Telehealth: Payer: Self-pay | Admitting: Cardiology

## 2011-10-07 NOTE — Telephone Encounter (Signed)
Left message on Krista Wise's identified voicemail that we had received her message about drawing lab work and to call back if she had additional questions

## 2011-10-07 NOTE — Telephone Encounter (Signed)
New Msg: Pt home health nurse Amy calling wanting to inform nurse/MD that she can draw pt lab work for [employer]. Please return call to discuss further.

## 2011-10-11 ENCOUNTER — Telehealth: Payer: Self-pay | Admitting: Cardiology

## 2011-10-11 NOTE — Telephone Encounter (Signed)
Faxed last ofc note, ekg, and echo to [facility]

## 2011-10-13 ENCOUNTER — Encounter: Payer: Self-pay | Admitting: Cardiovascular Disease

## 2011-10-13 ENCOUNTER — Other Ambulatory Visit: Payer: Medicare Other

## 2011-10-19 NOTE — Progress Notes (Signed)
Pt.notified

## 2011-10-28 ENCOUNTER — Other Ambulatory Visit: Payer: Self-pay | Admitting: Cardiology

## 2011-11-01 ENCOUNTER — Ambulatory Visit (INDEPENDENT_AMBULATORY_CARE_PROVIDER_SITE_OTHER): Payer: Medicare Other | Admitting: Cardiology

## 2011-11-01 ENCOUNTER — Encounter: Payer: Self-pay | Admitting: Cardiology

## 2011-11-01 VITALS — BP 118/62 | HR 69 | Ht 65.0 in | Wt 254.0 lb

## 2011-11-01 DIAGNOSIS — R0602 Shortness of breath: Secondary | ICD-10-CM

## 2011-11-01 DIAGNOSIS — R0902 Hypoxemia: Secondary | ICD-10-CM

## 2011-11-01 DIAGNOSIS — I509 Heart failure, unspecified: Secondary | ICD-10-CM

## 2011-11-01 DIAGNOSIS — E785 Hyperlipidemia, unspecified: Secondary | ICD-10-CM

## 2011-11-01 DIAGNOSIS — I2510 Atherosclerotic heart disease of native coronary artery without angina pectoris: Secondary | ICD-10-CM

## 2011-11-01 LAB — BASIC METABOLIC PANEL
BUN: 18 mg/dL (ref 6–23)
CO2: 28 mEq/L (ref 19–32)
Calcium: 9.6 mg/dL (ref 8.4–10.5)
Creatinine, Ser: 1.2 mg/dL (ref 0.4–1.2)

## 2011-11-01 LAB — BRAIN NATRIURETIC PEPTIDE: Pro B Natriuretic peptide (BNP): 261 pg/mL — ABNORMAL HIGH (ref 0.0–100.0)

## 2011-11-01 NOTE — Patient Instructions (Signed)
Your physician recommends that you have lab work today--BMET/BNP.  Your physician recommends that you schedule a follow-up appointment in: 3 months with Dr McLean.     

## 2011-11-02 NOTE — Assessment & Plan Note (Signed)
Triglycerides still elevated.  Needs to work on diet.  LDL is at goal (< 70).

## 2011-11-02 NOTE — Progress Notes (Signed)
PCP: [name]  66 yo with history of CAD, DM, HTN, OHS/OSA, and diastolic CHF presents for followup. She had unstable angina in 2/08 with a stent placed in her distal CFX. I saw her earlier this year due to severe exertional dyspnea (short of breath just walking around her house). She was volume overloaded on exam and hypertensive. I uptitrated her BP meds and started Lasix. She was set up for myoview and echo. The myoview was ischemic by both ECG and perfusion images. There was significant lateral ischemia. Echo showed preserved EF (55-60%), but there was moderate pulmonary hypertension suggesting likely diastolic LV dysfunction.  I took her to the cath lab in 4/12 where left heart cath showed 80-90% mid and distal circumflex stenoses before and after the prior-placed stent and a totally occluded moderate-sized OM1.  She received 2 drug eluting stents in the CFX.   In 2/13, she was admitted with hypercarbic respiratory failure requiring bipap.  She was thought to have acute bronchitis with reactive airways disease and decompensation of her baseline poor respiratory status.  She also had acute on chronic diastolic CHF.  Her beta blocker was changed to bisoprolol because of wheezing.  Mildly elevated troponin was thought to be due to demand ischemia.  She lost 11 lbs in the hospital with diuresis.  [name] wearing oxygen most of the time but is not using CPAP (it is not comfortable for her).  Lexiscan myoview done after this hospitalization showed EF 68%, moderate partially reversible lateral defect that actually looked better than the prior myoview.    Since I last saw Krista Wise, she has been doing better.  I decreased her Lasix back to once daily and also decreased her lisinopril as creatinine increased.  Repeat creatinine was back to baseline.  She has lost 7 lbs since last appointment.  [name] breathing better.  [name] still short of breath after walking about 100 feet.  No chest pain. No orthopnea or  PND.  She just returned from vacation to [facility].    Labs (3/12): BNP 417 => 309, K 4.4, creatinine 0.7, HCT 44, free T3/T4 normal, TSH normal, HDL 38, LDL 96.5  Labs (5/12): BNP 422 => 160, K 3.9, creatinine 0.9, HDL 33, LDL 84 Labs (6/12): LDL 50, HDL 34, TGs 197 Labs (7/12): K 4.1, creatinine 1.1 Labs (11/12): K 3.9, creatinine 1.4, BNP 91 Labs (2/13): K 3.6, creatinine 0.92 Labs (3/13): K 5.5 => 4, creatinine 2.2 => 0.9, HDL 33, TGs 335, LDL 54, BNP 76  Allergies (verified):  No Known Drug Allergies   Family History:  Father: Deceased; MI in his 78s, CABG, CHF  Mother: Alive; HTN, rhematoid arthritis, anemic   Social History:  Married, lives with husband in [place]  disabled: diabetes & fibromyagia  Tobacco Use - No.  Alcohol Use - no  Regular Exercise - no   Past Medical History:  1. Hypertension  2. Depression  3. DM  4. Obesity  5. GERD  6. CAD: Unstable angina 2/08. PCI to distal codominant CFX with 2.5 x 20 Taxus DES. Lexiscan myoview (3/12): chest pain and dyspnea with stress, EF 62%, small fixed lateral perfusion defect with significant surrounding reversible perfusion defect suggestive of significant ischemia, ischemic ECG response.  LHC (4/12): 80-90% mid and distal CFX stenosis before and after prior-placed stent, total occlusion of a moderate 1st OM.  Patient had DES x 2 to CFX.   Lexiscan myoview (3/13) with EF 68%, moderate partially reversible  lateral defect c/w prior MI/peri-infarct ischemia.  Defect less marked than on the prior study.  7. Fibromyalgia  8. Diastolic CHF: Echo (3/12) with EF 55-60%, mild LV hypertrophy, mild aortic stenosis (mean gradient 13 mmHg), mild MR, normal RV size and systolic function, PA systolic pressure 55 mmHg.  Echo (2/13): EF 60%, mild LVH, moderate diastolic dysfunction, mild AI, aortic sclerosis, mild MR, mild RV dilation and dysfunction, PASP 61 mmHg.  9. Aortic stenosis: Minimal by most recent echo.  10.  Diabetic  retinopathy 11.  H/o vertigo 12.  Peripheral neuropathy likely from diabetes.  13. OHS/OSA: Severe OSA on sleep study.  Patient is off CPAP currently b/c she could not tolerate the mask.    Current Outpatient Prescriptions  Medication Sig Dispense Refill  . albuterol (PROAIR HFA) 108 (90 BASE) MCG/ACT inhaler Inhale 2 puffs into the lungs every 6 (six) hours as needed. For shortness of breath.      Marland Kitchen amLODipine (NORVASC) 5 MG tablet Take 5 mg by mouth daily.      Marland Kitchen aspirin EC 81 MG EC tablet Take 1 tablet (81 mg total) by mouth daily.      Marland Kitchen atorvastatin (LIPITOR) 40 MG tablet Take 40 mg by mouth daily.      . bisoprolol (ZEBETA) 5 MG tablet Take 1 tablet (5 mg total) by mouth daily.  30 tablet  0  . buPROPion (WELLBUTRIN SR) 150 MG 12 hr tablet Take 150 mg by mouth 2 (two) times daily.      . clopidogrel (PLAVIX) 75 MG tablet Take 75 mg by mouth daily.      . DULoxetine (CYMBALTA) 60 MG capsule Take 60 mg by mouth daily.      . furosemide (LASIX) 20 MG tablet Take 3 tablets (60 mg total) by mouth daily.      Marland Kitchen glipiZIDE (GLUCOTROL) 5 MG tablet Take 5 mg by mouth daily.      . insulin glargine (LANTUS) 100 UNIT/ML injection Inject 35 Units into the skin at bedtime.  10 mL  0  . lisinopril (PRINIVIL,ZESTRIL) 20 MG tablet Take 1 tablet (20 mg total) by mouth daily.      . meclizine (ANTIVERT) 12.5 MG tablet Take 12.5 mg by mouth 3 (three) times daily as needed. For nausea.      . metFORMIN (GLUCOPHAGE) 1000 MG tablet Take 1,000 mg by mouth 2 (two) times daily.        . Multiple Vitamin (MULTIVITAMIN) tablet Take 1 tablet by mouth daily.        . pregabalin (LYRICA) 75 MG capsule Take 75 mg by mouth 2 (two) times daily.        Marland Kitchen rOPINIRole (REQUIP) 2 MG tablet Take 2 mg by mouth daily.        . simvastatin (ZOCOR) 40 MG tablet Take 40 mg by mouth every evening.      . traMADol (ULTRAM) 50 MG tablet Take 50-100 mg by mouth 2 (two) times daily as needed. For pain.      . traZODone (DESYREL) 50  MG tablet Take 50-100 mg by mouth at bedtime as needed. For sleep.      Marland Kitchen zolpidem (AMBIEN) 10 MG tablet Take 10 mg by mouth at bedtime as needed. For sleep.        BP 118/62  Pulse 69  Ht 5\' 5"  (1.651 m)  Wt 254 lb (115.214 kg)  BMI 42.27 kg/m2 General: Well developed, well nourished, in no acute distress. Obese.  Neck: Neck supple, JVP 7 cm. No masses, thyromegaly or abnormal cervical nodes.  Lungs: Clear bilaterally  Heart: Non-displaced PMI, chest non-tender; regular rate and rhythm, S1, S2 without rubs or gallops. 2/6 early systolic murmur RUSB. Carotid upstroke normal, no bruit. Pedals normal pulses. No edema.  Abdomen: Bowel sounds positive; abdomen soft and non-tender without masses, organomegaly, or hernias noted. No hepatosplenomegaly.  Extremities: No clubbing or cyanosis.  Neurologic: Alert and oriented x 3.  Psych: Normal affect.

## 2011-11-02 NOTE — Assessment & Plan Note (Addendum)
OHS/OSA.  [name] on home oxygen.  She has been unable to tolerate CPAP.  She follows with pulmonary.

## 2011-11-02 NOTE — Assessment & Plan Note (Signed)
S/p PCI with DES x 2 to CFX.  She will continue Plavix for at least a year and likely long-term given multiple drug eluting stents.  Troponin was mildly elevated when she was in the hospital in 2/13.  I think this was likely due to demand ischemia.  Lexiscan myoview showed a lateral defect in a similar location to the defect on the myoview prior to PCI.  The defect was less marked on this study.  Given improvement in overall symptoms, doubt significant coronary disease change.

## 2011-11-02 NOTE — Assessment & Plan Note (Signed)
Chronic diastolic CHF.  She has lost 7 lbs since last appointment.  Volume status looks ok and she has had some symptomatic improvement. She still has NYHA class III symptoms, which I think is a mixture of CHF, OHS, and deconditioning.  Check BMET/BNP.  When last checked, creatinine and K had returned to baseline.

## 2011-11-23 ENCOUNTER — Ambulatory Visit (INDEPENDENT_AMBULATORY_CARE_PROVIDER_SITE_OTHER): Payer: Medicare Other | Admitting: Pulmonary Disease

## 2011-11-23 ENCOUNTER — Encounter: Payer: Self-pay | Admitting: Pulmonary Disease

## 2011-11-23 VITALS — BP 124/82 | HR 65 | Temp 98.0°F | Ht 65.0 in | Wt 254.8 lb

## 2011-11-23 DIAGNOSIS — G4733 Obstructive sleep apnea (adult) (pediatric): Secondary | ICD-10-CM

## 2011-11-23 NOTE — Assessment & Plan Note (Addendum)
The patient continues to have poor tolerance of her CPAP, despite working on desensitization and trying different masks.  She does not believe this is a viable therapy for her, and wishes to discontinue.  I have explained again the impact of severe sleep apnea and cardiovascular health, but she continues to feel that she sleeps well at night and has no significant sleepiness during the day.  We'll discontinue her CPAP, but would like to evaluate her for possible nocturnal oxygen.  Will check overnight oximetry on room air and let her know the results.  I've encouraged her to work aggressively on weight loss, and we'll see her back on an as needed basis.

## 2011-11-23 NOTE — Patient Instructions (Signed)
Will discontinue your cpap Will check your oxygen level overnight on room air (no oxygen) to see if you need to wear at night while sleeping Work on weight loss. Will call you with results of the oxygen testing.

## 2011-11-23 NOTE — Progress Notes (Signed)
  Subjective:    Patient ID: Krista Wise, female    DOB: [birth year], 66 y.o.   MRN: [id number]  HPI The patient comes in today for followup of her known severe obstructive sleep apnea.  We have been working with her diligently to try and improve CPAP compliance, but she continues to have issues with claustrophobia and mask intolerance.  The patient feels that CPAP is not a viable therapy for her, and wishes to discontinue.   Review of Systems  Constitutional: Negative for fever and unexpected weight change.  HENT: Negative for ear pain, nosebleeds, congestion, sore throat, rhinorrhea, sneezing, trouble swallowing, dental problem, postnasal drip and sinus pressure.   Eyes: Positive for redness and itching.  Respiratory: Negative for cough, chest tightness, shortness of breath and wheezing.   Cardiovascular: Positive for leg swelling. Negative for palpitations.  Gastrointestinal: Negative for nausea and vomiting.  Genitourinary: Negative for dysuria.  Musculoskeletal: Positive for joint swelling.  Skin: Negative for rash.  Hematological: Does not bruise/bleed easily.  Psychiatric/Behavioral: Negative for dysphoric mood. The patient is not nervous/anxious.        Objective:   Physical Exam Morbidly obese female in no acute distress Nose without purulence or discharge noted No skin breakdown or pressure necrosis from the CPAP mask Lower extremities with positive edema, no cyanosis Awake, but does appear mildly sleepy, moves all 4 extremities.       Assessment & Plan:

## 2011-11-30 ENCOUNTER — Encounter: Payer: Self-pay | Admitting: Pulmonary Disease

## 2011-11-30 ENCOUNTER — Other Ambulatory Visit: Payer: Self-pay | Admitting: Pulmonary Disease

## 2011-11-30 DIAGNOSIS — G4733 Obstructive sleep apnea (adult) (pediatric): Secondary | ICD-10-CM

## 2011-12-09 ENCOUNTER — Encounter: Payer: Self-pay | Admitting: Pulmonary Disease

## 2011-12-19 ENCOUNTER — Other Ambulatory Visit: Payer: Self-pay | Admitting: Cardiology

## 2011-12-26 ENCOUNTER — Other Ambulatory Visit: Payer: Self-pay | Admitting: Cardiology

## 2011-12-28 ENCOUNTER — Telehealth: Payer: Self-pay | Admitting: *Deleted

## 2011-12-28 ENCOUNTER — Other Ambulatory Visit: Payer: Self-pay | Admitting: *Deleted

## 2011-12-28 NOTE — Telephone Encounter (Signed)
I confirmed with pt's husband that pt is not taking simvastatin.

## 2012-01-24 ENCOUNTER — Other Ambulatory Visit: Payer: Self-pay | Admitting: Cardiology

## 2012-02-07 ENCOUNTER — Emergency Department (HOSPITAL_COMMUNITY): Payer: Medicare Other

## 2012-02-07 ENCOUNTER — Emergency Department (HOSPITAL_COMMUNITY)
Admission: EM | Admit: 2012-02-07 | Discharge: 2012-02-07 | Disposition: A | Discharge status: Home / Self Care | Payer: Medicare Other | Attending: Emergency Medicine | Admitting: Emergency Medicine

## 2012-02-07 ENCOUNTER — Encounter (HOSPITAL_COMMUNITY): Payer: Self-pay

## 2012-02-07 DIAGNOSIS — Z79899 Other long term (current) drug therapy: Secondary | ICD-10-CM | POA: Insufficient documentation

## 2012-02-07 DIAGNOSIS — E119 Type 2 diabetes mellitus without complications: Secondary | ICD-10-CM | POA: Insufficient documentation

## 2012-02-07 DIAGNOSIS — R220 Localized swelling, mass and lump, head: Secondary | ICD-10-CM | POA: Insufficient documentation

## 2012-02-07 DIAGNOSIS — W19XXXA Unspecified fall, initial encounter: Secondary | ICD-10-CM | POA: Insufficient documentation

## 2012-02-07 DIAGNOSIS — I2510 Atherosclerotic heart disease of native coronary artery without angina pectoris: Secondary | ICD-10-CM | POA: Insufficient documentation

## 2012-02-07 DIAGNOSIS — S43409A Unspecified sprain of unspecified shoulder joint, initial encounter: Secondary | ICD-10-CM

## 2012-02-07 DIAGNOSIS — IMO0002 Reserved for concepts with insufficient information to code with codable children: Secondary | ICD-10-CM | POA: Insufficient documentation

## 2012-02-07 DIAGNOSIS — Z794 Long term (current) use of insulin: Secondary | ICD-10-CM | POA: Insufficient documentation

## 2012-02-07 DIAGNOSIS — S5000XA Contusion of unspecified elbow, initial encounter: Secondary | ICD-10-CM | POA: Insufficient documentation

## 2012-02-07 DIAGNOSIS — I10 Essential (primary) hypertension: Secondary | ICD-10-CM | POA: Insufficient documentation

## 2012-02-07 DIAGNOSIS — S0100XA Unspecified open wound of scalp, initial encounter: Secondary | ICD-10-CM | POA: Insufficient documentation

## 2012-02-07 DIAGNOSIS — K219 Gastro-esophageal reflux disease without esophagitis: Secondary | ICD-10-CM | POA: Insufficient documentation

## 2012-02-07 DIAGNOSIS — S0101XA Laceration without foreign body of scalp, initial encounter: Secondary | ICD-10-CM

## 2012-02-07 DIAGNOSIS — F3289 Other specified depressive episodes: Secondary | ICD-10-CM | POA: Insufficient documentation

## 2012-02-07 DIAGNOSIS — F329 Major depressive disorder, single episode, unspecified: Secondary | ICD-10-CM | POA: Insufficient documentation

## 2012-02-07 DIAGNOSIS — M25529 Pain in unspecified elbow: Secondary | ICD-10-CM | POA: Insufficient documentation

## 2012-02-07 DIAGNOSIS — M25519 Pain in unspecified shoulder: Secondary | ICD-10-CM | POA: Insufficient documentation

## 2012-02-07 NOTE — ED Notes (Signed)
Received report from [employer]. Pt is not in any respiratory or cardiac distress. No pain at this time. Pt is fully alert and oriented. GCS is 12. No neurological deficits. Will discharge pt at this time.

## 2012-02-07 NOTE — ED Provider Notes (Signed)
History     CSN: [id number]  Arrival date & time 02/07/12  1250   First MD Initiated Contact with Patient 02/07/12 1542      Chief Complaint  Patient presents with  . Fall    (Consider location/radiation/quality/duration/timing/severity/associated sxs/prior treatment) HPI Patient presents emergency department stay following a fall.  Patient, states she fell about 8 and states that her right shoulder right elbow are painful.  Patient, states she did not hit her head has a cut to the scalp.  Patient, states she also has a small cut to her right elbow.  Patient denies, syncope, nausea, vomiting, diarrhea, chest pain, shortness of breath, weakness, abdominal pain, back pain, neck pain, dizziness, or visual changes. Past Medical History  Diagnosis Date  . Hypertension   . Depression   . Diabetes mellitus   . GERD (gastroesophageal reflux disease)   . Obesity   . Coronary artery disease 08/2006    Unstable angina. PCI to distal codominant CFX with 2.5 x 20 Taxus DES...    Past Surgical History  Procedure Date  . Coronary stent placement 08/2006    CAD; stent placed @ [facility]  . Total abdominal hysterectomy     Family History  Problem Relation Age of Onset  . Heart attack Father   . Heart failure Father   . Anemia Mother   . Hypertension Mother     History  Substance Use Topics  . Smoking status: Never Smoker   . Smokeless tobacco: Never Used  . Alcohol Use: No    OB History    Grav Para Term Preterm Abortions TAB SAB Ect Mult Living                  Review of Systems All other systems negative except as documented in the HPI. All pertinent positives and negatives as reviewed in the HPI.  Allergies  Review of patient's allergies indicates no known allergies.  Home Medications   Current Outpatient Rx  Name Route Sig Dispense Refill  . ALBUTEROL SULFATE HFA 108 (90 BASE) MCG/ACT IN AERS Inhalation Inhale 2 puffs into the lungs every 6 (six) hours as needed.  For shortness of breath.    . AMLODIPINE BESYLATE 5 MG PO TABS Oral Take 5 mg by mouth daily.    . ASPIRIN EC 81 MG PO TBEC Oral Take 1 tablet (81 mg total) by mouth daily.    . ATORVASTATIN CALCIUM 40 MG PO TABS Oral Take 40 mg by mouth at bedtime.     Marland Kitchen BISOPROLOL FUMARATE 5 MG PO TABS Oral Take 1 tablet (5 mg total) by mouth daily. 30 tablet 0  . BUPROPION HCL ER (SR) 150 MG PO TB12 Oral Take 150 mg by mouth 2 (two) times daily.    Marland Kitchen CLOPIDOGREL BISULFATE 75 MG PO TABS Oral Take 75 mg by mouth daily.    . DULOXETINE HCL 60 MG PO CPEP Oral Take 60 mg by mouth daily.    . FUROSEMIDE 20 MG PO TABS Oral Take 40 mg by mouth daily.     Marland Kitchen GLIPIZIDE 5 MG PO TABS Oral Take 5 mg by mouth daily.    . INSULIN GLARGINE 100 UNIT/ML [place] SOLN Subcutaneous Inject 45 Units into the skin at bedtime.    Marland Kitchen LISINOPRIL 20 MG PO TABS Oral Take 1 tablet (20 mg total) by mouth daily.    Marland Kitchen METFORMIN HCL 1000 MG PO TABS Oral Take 1,000 mg by mouth 2 (two) times daily.      Marland Kitchen  ONE-DAILY MULTI VITAMINS PO TABS Oral Take 1 tablet by mouth daily.      Marland Kitchen PREGABALIN 75 MG PO CAPS Oral Take 75 mg by mouth 2 (two) times daily.      Marland Kitchen ROPINIROLE HCL 2 MG PO TABS Oral Take 2 mg by mouth at bedtime.     . TRAMADOL HCL 50 MG PO TABS Oral Take 50-100 mg by mouth 2 (two) times daily as needed. For pain.    . TRAZODONE HCL 50 MG PO TABS Oral Take 50-100 mg by mouth at bedtime as needed. For sleep.    Marland Kitchen ZOLPIDEM TARTRATE 10 MG PO TABS Oral Take 10 mg by mouth at bedtime as needed. For sleep.      BP 101/36  Pulse 61  Temp 97.7 F (36.5 C) (Oral)  Resp 19  SpO2 99%  Physical Exam  Nursing note and vitals reviewed. Constitutional: [name] oriented to person, place, and time. She appears well-developed and well-nourished. No distress.  HENT:  Head: Normocephalic.    Eyes: EOM are normal. Pupils are equal, round, and reactive to light.  Neck: Normal range of motion. Neck supple.  Cardiovascular: Normal rate, regular rhythm and  normal heart sounds.   Pulmonary/Chest: Effort normal and breath sounds normal. She has no wheezes. She has no rales. She exhibits no tenderness.  Abdominal: Soft. Bowel sounds are normal. She exhibits no distension. There is no tenderness.  Musculoskeletal:       Right shoulder: She exhibits tenderness and pain. She exhibits normal range of motion and no spasm.       Right elbow: She exhibits swelling and laceration. She exhibits normal range of motion, no effusion and no deformity. tenderness found.       Cervical back: She exhibits normal range of motion, no tenderness, no bony tenderness and no deformity.       Lumbar back: She exhibits normal range of motion, no tenderness, no bony tenderness, no deformity and no pain.  Neurological: [name] alert and oriented to person, place, and time. She exhibits normal muscle tone. Coordination normal.    ED Course  Procedures (including critical care time)  Labs Reviewed - No data to display Dg Shoulder Right  02/07/2012  *RADIOLOGY REPORT*  Clinical Data: Fall.  Shoulder pain.  RIGHT SHOULDER - 2+ VIEW  Comparison: None.  Findings: No fracture or dislocation.  Degenerative changes most notable acromioclavicular joint.  Soft tissue calcifications.  Pulmonary vascular prominence right upper lobe.  IMPRESSION: No fracture or dislocation.  Please see above.  Original Report Authenticated By: [name] Canada, M.D.   Dg Elbow Complete Right  02/07/2012  *RADIOLOGY REPORT*  Clinical Data: Fall.  Pain.  RIGHT ELBOW - COMPLETE 3+ VIEW  Comparison: None.  Findings: Rotated lateral view slightly limits evaluation.  No fracture or dislocation noted.  Soft tissue injury olecranon region.  Olecranon spur.  IMPRESSION: Rotated lateral view slightly limits evaluation.  No fracture or dislocation noted.  Soft tissue injury olecranon region.  Olecranon spur.  Original Report Authenticated By: [name] Canada, M.D.   Ct Head Wo Contrast  02/07/2012  *RADIOLOGY REPORT*   Clinical Data:  Fall.  CT HEAD WITHOUT CONTRAST CT CERVICAL SPINE WITHOUT CONTRAST  Technique:  Multidetector CT imaging of the head and cervical spine was performed following the standard protocol without intravenous contrast.  Multiplanar CT image reconstructions of the cervical spine were also generated.  Comparison:  Head CT 01/25/2011.  CT HEAD  Findings: Minimal  soft tissue swelling right frontal region.  No skull fracture or intracranial hemorrhage.  Mild atrophy without hydrocephalus.  Mild small vessel disease type changes without CT evidence of large acute infarct.  No intracranial mass lesion detected on this unenhanced exam.  IMPRESSION: No skull fracture or intracranial hemorrhage.  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  No cervical spine fracture.  Cervical spondylotic changes.  Evaluation slightly limited by patient's habitus.  IMPRESSION: No cervical spine fracture.  Please see above.  Original Report Authenticated By: [name] Canada, M.D.   Ct Cervical Spine Wo Contrast  02/07/2012  *RADIOLOGY REPORT*  Clinical Data:  Fall.  CT HEAD WITHOUT CONTRAST CT CERVICAL SPINE WITHOUT CONTRAST  Technique:  Multidetector CT imaging of the head and cervical spine was performed following the standard protocol without intravenous contrast.  Multiplanar CT image reconstructions of the cervical spine were also generated.  Comparison:  Head CT 01/25/2011.  CT HEAD  Findings: Minimal soft tissue swelling right frontal region.  No skull fracture or intracranial hemorrhage.  Mild atrophy without hydrocephalus.  Mild small vessel disease type changes without CT evidence of large acute infarct.  No intracranial mass lesion detected on this unenhanced exam.  IMPRESSION: No skull fracture or intracranial hemorrhage.  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   No cervical spine fracture.  Cervical spondylotic changes.  Evaluation slightly limited by patient's habitus.  IMPRESSION: No cervical spine fracture.  Please see above.  Original Report Authenticated By: [name] Canada, M.D.   LACERATION REPAIR Performed by: Carlyle Dolly Authorized by: Carlyle Dolly Consent: Verbal consent obtained. Risks and benefits: risks, benefits and alternatives were discussed Consent given by: patient Patient identity confirmed: provided demographic data Prepped and Draped in normal sterile fashion Wound explored  Laceration Location: scalp superficial  Laceration Length: 2cm  No Foreign Bodies seen or palpated  Anesthesia: local infiltration  Local anesthetic: none  Anesthetic total: none  Irrigation method: syringe Amount of cleaning: standard  Skin closure: dermabond  Number of sutures: na  Technique: Dermabond  Patient tolerance: Patient tolerated the procedure well with no immediate complications.     Patient has a superficial laceration to R elbow.  The patient did not have syncope, chest pain, or shortness of breath, leading up to her fall.  Patient has difficulty ambulating and uses a walker and was unable to use it in the bathroom.  [name] advised follow up with her primary care Dr. for, recheck.  [name] told to return here for any worsening in her condition MDM         Carlyle Dolly, PA-C 02/07/12 1928

## 2012-02-07 NOTE — ED Notes (Signed)
Trying to go to restroom and slipped and fell on way to bathroom and in the bathroom, lac to forehead, right arm,

## 2012-02-08 NOTE — ED Provider Notes (Signed)
Medical screening examination/treatment/procedure(s) were performed by non-physician practitioner and as supervising physician I was immediately available for consultation/collaboration.   Laray Anger, DO 02/08/12 1311

## 2012-02-10 ENCOUNTER — Encounter: Payer: Self-pay | Admitting: Cardiology

## 2012-02-10 ENCOUNTER — Ambulatory Visit (INDEPENDENT_AMBULATORY_CARE_PROVIDER_SITE_OTHER): Payer: Medicare Other | Admitting: Cardiology

## 2012-02-10 ENCOUNTER — Telehealth: Payer: Self-pay | Admitting: Physician Assistant

## 2012-02-10 VITALS — BP 88/50 | HR 78 | Ht 66.0 in | Wt 246.0 lb

## 2012-02-10 DIAGNOSIS — I2510 Atherosclerotic heart disease of native coronary artery without angina pectoris: Secondary | ICD-10-CM

## 2012-02-10 DIAGNOSIS — I5032 Chronic diastolic (congestive) heart failure: Secondary | ICD-10-CM

## 2012-02-10 LAB — BASIC METABOLIC PANEL
Calcium: 9.3 mg/dL (ref 8.4–10.5)
Creat: 2.1 mg/dL — ABNORMAL HIGH (ref 0.50–1.10)
Sodium: 135 mEq/L (ref 135–145)

## 2012-02-10 LAB — BRAIN NATRIURETIC PEPTIDE: Brain Natriuretic Peptide: 111.9 pg/mL — ABNORMAL HIGH (ref 0.0–100.0)

## 2012-02-10 NOTE — Progress Notes (Signed)
PCP: [name]  66 yo with history of CAD, DM, HTN, OHS/OSA, and diastolic CHF presents for followup. She had unstable angina in 2/08 with a stent placed in her distal CFX. I saw her earlier this year due to severe exertional dyspnea (short of breath just walking around her house). She was volume overloaded on exam and hypertensive. I uptitrated her BP meds and started Lasix. She was set up for myoview and echo. The myoview was ischemic by both ECG and perfusion images. There was significant lateral ischemia. Echo showed preserved EF (55-60%), but there was moderate pulmonary hypertension suggesting likely diastolic LV dysfunction.  I took her to the cath lab in 4/12 where left heart cath showed 80-90% mid and distal circumflex stenoses before and after the prior-placed stent and a totally occluded moderate-sized OM1.  She received 2 drug eluting stents in the CFX.   In 2/13, she was admitted with hypercarbic respiratory failure requiring bipap.  She was thought to have acute bronchitis with reactive airways disease and decompensation of her baseline poor respiratory status.  She also had acute on chronic diastolic CHF.  Her beta blocker was changed to bisoprolol because of wheezing.  Mildly elevated troponin was thought to be due to demand ischemia.  She lost 11 lbs in the hospital with diuresis.  [name] wearing oxygen most of the time but is not using CPAP (it is not comfortable for her).  Lexiscan myoview done after this hospitalization showed EF 68%, moderate partially reversible lateral defect that actually looked better than the prior myoview.    Patient states that she feels well. Her lasix and lisinopril were decreased due to elevated creatinine level since April, and her creatinine returns back to her baseline. She continues to lose weight and has lost 8 lbs since last appointment in April, 2013.  [name] breathing better.  However, she is unable to exercise due to her generalized legs weakness. She  only walks in her home (no stairs) but does not do any house work because she gets tired easily. She used to go to physical therapy for her muscle strength but she has not gone back for over one year. No chest pain. No orthopnea or PND.   Patient reports that she has had mechanical falls x 4 times this week. Denies dizziness, lightheadedness or vertigo. No chest discomfort or palpitation. She was evaluated at ED with negative Head and cervical spine CT. Only some soft tissue injury at her right elbow is noted and she is supposed to follow up with her PCP.   Labs (3/12): BNP 417 => 309, K 4.4, creatinine 0.7, HCT 44, free T3/T4 normal, TSH normal, HDL 38, LDL 96.5  Labs (5/12): BNP 422 => 160, K 3.9, creatinine 0.9, HDL 33, LDL 84 Labs (6/12): LDL 50, HDL 34, TGs 197 Labs (7/12): K 4.1, creatinine 1.1 Labs (11/12): K 3.9, creatinine 1.4, BNP 91 Labs (2/13): K 3.6, creatinine 0.92 Labs (3/13): K 5.5 => 4, creatinine 2.2 => 0.9, HDL 33, TGs 335, LDL 54, BNP 76 Labs (4/13) K 4.2, Creatinine 1.2, BNP 261,   Allergies (verified):  No Known Drug Allergies   Family History:  Father: Deceased; MI in his 80s, CABG, CHF  Mother: Alive; HTN, rhematoid arthritis, anemic   Social History:  Married, lives with husband in [place]  disabled: diabetes & fibromyagia  Tobacco Use - No.  Alcohol Use - no  Regular Exercise - no   Past Medical History:  1. Hypertension  2. Depression  3. DM  4. Obesity  5. GERD  6. CAD: Unstable angina 2/08. PCI to distal codominant CFX with 2.5 x 20 Taxus DES. Lexiscan myoview (3/12): chest pain and dyspnea with stress, EF 62%, small fixed lateral perfusion defect with significant surrounding reversible perfusion defect suggestive of significant ischemia, ischemic ECG response.  LHC (4/12): 80-90% mid and distal CFX stenosis before and after prior-placed stent, total occlusion of a moderate 1st OM.  Patient had DES x 2 to CFX.   Lexiscan myoview (3/13) with EF 68%,  moderate partially reversible lateral defect c/w prior MI/peri-infarct ischemia.  Defect less marked than on the prior study.  7. Fibromyalgia  8. Diastolic CHF: Echo (3/12) with EF 55-60%, mild LV hypertrophy, mild aortic stenosis (mean gradient 13 mmHg), mild MR, normal RV size and systolic function, PA systolic pressure 55 mmHg.  Echo (2/13): EF 60%, mild LVH, moderate diastolic dysfunction, mild AI, aortic sclerosis, mild MR, mild RV dilation and dysfunction, PASP 61 mmHg.  9. Aortic stenosis: Minimal by most recent echo.  10.  Diabetic retinopathy 11.  H/o vertigo 12.  Peripheral neuropathy likely from diabetes.  13. OHS/OSA: Severe OSA on sleep study.  Patient is off CPAP currently b/c she could not tolerate the mask.    Current Outpatient Prescriptions  Medication Sig Dispense Refill  . albuterol (PROAIR HFA) 108 (90 BASE) MCG/ACT inhaler Inhale 2 puffs into the lungs every 6 (six) hours as needed. For shortness of breath.      Marland Kitchen amLODipine (NORVASC) 5 MG tablet Take 5 mg by mouth daily.      Marland Kitchen aspirin EC 81 MG EC tablet Take 1 tablet (81 mg total) by mouth daily.      Marland Kitchen atorvastatin (LIPITOR) 40 MG tablet Take 40 mg by mouth at bedtime.       . bisoprolol (ZEBETA) 5 MG tablet Take 1 tablet (5 mg total) by mouth daily.  30 tablet  0  . buPROPion (WELLBUTRIN SR) 150 MG 12 hr tablet Take 150 mg by mouth 2 (two) times daily.      . clopidogrel (PLAVIX) 75 MG tablet Take 75 mg by mouth daily.      . DULoxetine (CYMBALTA) 60 MG capsule Take 60 mg by mouth daily.      . furosemide (LASIX) 20 MG tablet Take 40 mg by mouth daily.       Marland Kitchen glipiZIDE (GLUCOTROL) 5 MG tablet Take 5 mg by mouth daily.      . insulin glargine (LANTUS) 100 UNIT/ML injection Inject 45 Units into the skin at bedtime.      Marland Kitchen lisinopril (PRINIVIL,ZESTRIL) 20 MG tablet Take 1 tablet (20 mg total) by mouth daily.      . metFORMIN (GLUCOPHAGE) 1000 MG tablet Take 1,000 mg by mouth 2 (two) times daily.        . Multiple  Vitamin (MULTIVITAMIN) tablet Take 1 tablet by mouth daily.        . pregabalin (LYRICA) 75 MG capsule Take 75 mg by mouth 2 (two) times daily.        Marland Kitchen rOPINIRole (REQUIP) 2 MG tablet Take 2 mg by mouth at bedtime.       . traMADol (ULTRAM) 50 MG tablet Take 50-100 mg by mouth 2 (two) times daily as needed. For pain.      . traZODone (DESYREL) 50 MG tablet Take 50-100 mg by mouth at bedtime as needed. For sleep.      Marland Kitchen zolpidem (  AMBIEN) 10 MG tablet Take 10 mg by mouth at bedtime as needed. For sleep.      Marland Kitchen DISCONTD: simvastatin (ZOCOR) 40 MG tablet Take 40 mg by mouth every evening.        BP 88/50  Pulse 78  Ht 5\' 6"  (1.676 m)  Wt 246 lb (111.585 kg)  BMI 39.71 kg/m2 General: Well developed, well nourished, in no acute distress. Obese.  Neck: Neck supple, JVP 7 cm. No masses, thyromegaly or abnormal cervical nodes.  Lungs: Clear bilaterally  Heart: Non-displaced PMI, chest non-tender; regular rate and rhythm, S1, S2 without rubs or gallops. 2/6 early systolic murmur RUSB. Carotid upstroke normal, no bruit. Pedals normal pulses. No edema.  Abdomen: Bowel sounds positive; abdomen soft and non-tender without masses, organomegaly, or hernias noted. No hepatosplenomegaly.  Extremities: No clubbing or cyanosis.  Neurologic: Alert and oriented x 3.  Psych: Normal affect.  CAD -  S/p PCI with DES x 2 to CFX. She will continue Plavix for at least a year and likely long-term given multiple drug eluting stents. Troponin was mildly elevated when she was in the hospital in 2/13. I think this was likely due to demand ischemia. Lexiscan myoview showed a lateral defect in a similar location to the defect on the myoview prior to PCI. The defect was less marked on this study. Given improvement in overall symptoms, doubt significant coronary disease change.  - will continue her ASA, Plavix, BB, ACEI, Statin.  - follow up 2 months  CHF -  Chronic diastolic CHF. NYHA class III symptoms, likely mixture  of CHF, OHS, and deconditioning. Good volume status. She has lost 8 lbs since last appointment in April. As a matter of fact, she started to feel thirsty for past two weeks likely due to diuresis and fluid restriction.  - Check BMET/BNP.   Mechanical falls Likely due to generalized weakness and gait instability.  She also states that she has tripped in urine due to incontinence.   - will consider home health PT for gait training.  Incontinence, likely stress incontinence. Patient states that she has had two children and just noticed that she could not hold her urine when standing up.   - will instruct patient to see her GYN.  Hypoxia - [name], MD 11/02/2011 11:18 AM Addendum  OHS/OSA. [name] on home oxygen. She has been unable to tolerate CPAP. She follows with pulmonary.  Previous Version  Hyperlipidemia - [name], MD 11/02/2011 11:19 AM Signed  Triglycerides still elevated. LDL is at goal (< 70).   - continue Lipitor  HTN  Patient states that her BP ranges at 110's/50's at home. Her BP is 88/50 and repeat BP 90/45 (manual) at the clinic today without any discomfort.  - will D/C her Norvasc.  DM Will defer the management to her PCP.  Patient seen with resident Dr. Dierdre Searles, agree with the above note.  BP running low today.  She denies lightheadedness.  She has had 4 mechanical falls in the last week.  It actually sounds like she is losing control of her bladder when she stands then has slipped in urine on the floor.   - I would like her to see her GYN to review options for bladder control.   - Stop amlodipine with low BP.  - Continue current Lasix - Check BMET/BNP - PT for balance training.   [name] 02/10/2012 3:38 PM

## 2012-02-10 NOTE — Telephone Encounter (Signed)
Was paged by [facility] labs regarding a critical lab value on Krista Wise. Patient had BMET/BNP drawn today after her office visit. BNP pending. Cr elevated at 2.10 (11/01/11 at 1.2, 10/05/11 at 2.2, 09/06/11 at 0.92). Baseline seems to be around 0.76-1.2. She was seen in the office today and felt to be stable, had lost 8 lbs since her prior follow-up and had even become thirsty lately likely due to diuresis and fluid restriction. She continues to take lisinopril, metformin and lasix 40mg  po daily. Advised to hold ACEi, lasix and metformin this weekend. This may be representative of a mild hypovolemia/prerenal picture in the setting of her diuresis and fluid restriction. Will arrange for repeat BMET on Monday in the office to assess renal function. Will forward to Dr. Shirlee Latch for further recommendations.   Jacqulyn Bath, PA-C .02/10/2012 10:06 PM

## 2012-02-10 NOTE — Patient Instructions (Addendum)
Stop norvasc(amlodipine).  You have been referred for Home Physical Therapy.  Your physician recommends that you have  lab work today--BMET/BNP.  Schedule an appointment with your GYN doctor about your urinary incontinence.  Your physician recommends that you schedule a follow-up appointment in: 2 months with Dr Shirlee Latch.

## 2012-02-13 ENCOUNTER — Other Ambulatory Visit: Payer: Self-pay

## 2012-02-13 ENCOUNTER — Telehealth: Payer: Self-pay

## 2012-02-13 ENCOUNTER — Other Ambulatory Visit (INDEPENDENT_AMBULATORY_CARE_PROVIDER_SITE_OTHER): Payer: Medicare Other

## 2012-02-13 DIAGNOSIS — E875 Hyperkalemia: Secondary | ICD-10-CM

## 2012-02-13 DIAGNOSIS — I5032 Chronic diastolic (congestive) heart failure: Secondary | ICD-10-CM

## 2012-02-13 DIAGNOSIS — I2510 Atherosclerotic heart disease of native coronary artery without angina pectoris: Secondary | ICD-10-CM

## 2012-02-13 LAB — BASIC METABOLIC PANEL
BUN: 28 mg/dL — ABNORMAL HIGH (ref 6–23)
Chloride: 93 mEq/L — ABNORMAL LOW (ref 96–112)
Creatinine, Ser: 1.8 mg/dL — ABNORMAL HIGH (ref 0.4–1.2)
GFR: 29.91 mL/min — ABNORMAL LOW (ref >60.00)
Potassium: 4.5 mEq/L (ref 3.5–5.1)

## 2012-02-13 NOTE — Telephone Encounter (Signed)
Received phone call from patient was told of lab results.Dr.McLean advised to decrease lasix to 20 mg daily,decrease lisinopril to 10 mg daily.Repeat bmet in 1 week.Check B/P daily and record, call office to report readings.Patient has appointment with Tereso Newcomer PA 02/22/12.

## 2012-02-20 ENCOUNTER — Other Ambulatory Visit: Payer: Medicare Other

## 2012-02-21 ENCOUNTER — Other Ambulatory Visit (INDEPENDENT_AMBULATORY_CARE_PROVIDER_SITE_OTHER): Payer: Medicare Other

## 2012-02-21 DIAGNOSIS — I5032 Chronic diastolic (congestive) heart failure: Secondary | ICD-10-CM

## 2012-02-21 DIAGNOSIS — I2510 Atherosclerotic heart disease of native coronary artery without angina pectoris: Secondary | ICD-10-CM

## 2012-02-21 LAB — BASIC METABOLIC PANEL
CO2: 31 mEq/L (ref 19–32)
Calcium: 9.1 mg/dL (ref 8.4–10.5)
GFR: 49.97 mL/min — ABNORMAL LOW (ref >60.00)
Glucose, Bld: 406 mg/dL — ABNORMAL HIGH (ref 70–99)
Potassium: 4 mEq/L (ref 3.5–5.1)
Sodium: 139 mEq/L (ref 135–145)

## 2012-02-22 ENCOUNTER — Ambulatory Visit (INDEPENDENT_AMBULATORY_CARE_PROVIDER_SITE_OTHER): Payer: Medicare Other | Admitting: Physician Assistant

## 2012-02-22 ENCOUNTER — Encounter: Payer: Self-pay | Admitting: Physician Assistant

## 2012-02-22 VITALS — BP 106/59 | HR 69 | Ht 66.0 in | Wt 248.8 lb

## 2012-02-22 DIAGNOSIS — I509 Heart failure, unspecified: Secondary | ICD-10-CM

## 2012-02-22 DIAGNOSIS — N289 Disorder of kidney and ureter, unspecified: Secondary | ICD-10-CM

## 2012-02-22 DIAGNOSIS — I10 Essential (primary) hypertension: Secondary | ICD-10-CM

## 2012-02-22 DIAGNOSIS — I2510 Atherosclerotic heart disease of native coronary artery without angina pectoris: Secondary | ICD-10-CM

## 2012-02-22 NOTE — Patient Instructions (Addendum)
Weigh daily and call if:  Weight up 2-3 lbs in one day (or 5 lbs in 7 days), increased swelling or increased dyspnea.   No changes today  Keep appt with Dr. Shirlee Latch 03/2012

## 2012-02-22 NOTE — Progress Notes (Signed)
[address] [place], [place]  [address] Phone: [phone] Fax:  [phone]  Date:  02/22/2012   Name:  Krista Wise   DOB:  [birth year]   MRN:  [id number]  PCP:  Egbert Garibaldi, NP  Primary Cardiologist:  Dr. [name]  Primary Electrophysiologist:  None    History of Present Illness: Krista Wise is a 66 y.o. female who returns for follow up.  She has a history of CAD, DM, HTN, OHS/OSA, and diastolic CHF.  She had unstable angina in 2/08 with a stent placed in her distal CFX.  She was seen earlier this year for volume overload.  She was set up for myoview and echo. The myoview was ischemic by both ECG and perfusion images. There was significant lateral ischemia. Echo showed preserved EF (55-60%), but there was moderate pulmonary hypertension suggesting likely diastolic LV dysfunction.  LHC 4/12:  80-90% mid and distal circumflex stenoses before and after the prior-placed stent and a totally occluded moderate-sized OM1. She received 2 drug eluting stents in the CFX.    Admitted 2/13 with hypercarbic respiratory failure requiring bipap. She was thought to have acute bronchitis with reactive airways disease and decompensation of her baseline poor respiratory status. She also had acute on chronic diastolic CHF. Her beta blocker was changed to bisoprolol because of wheezing. Mildly elevated troponin was thought to be due to demand ischemia. She lost 11 lbs in the hospital with diuresis. [name] wearing oxygen most of the time but is not using CPAP (it is not comfortable for her). Lexiscan myoview done after this hospitalization showed EF 68%, moderate partially reversible lateral defect that actually looked better than the prior myoview.   Last seen by Dr. Shirlee Latch 02/10/12. Volume was stable at that time.  Followup labs demonstrated worsening creatinine and her Lasix and lisinopril were both reduced. She was asked to followup today to assess her volume  status.  Labs demonstrated a return of her creatinine back to baseline.  [name] doing well. Check she feels better on the lower dose of Lasix and lisinopril. Blood pressures have been stable at home. She denies orthopnea or PND. She denies significant LE edema. She denies increased abdominal girth. She denies chest pain or syncope. Her breathing is stable. She's probably chronic class IIb-III.  Wt Readings from Last 3 Encounters:  02/22/12 248 lb 12.8 oz (112.855 kg)  02/10/12 246 lb (111.585 kg)  11/23/11 254 lb 12.8 oz (115.577 kg)    Potassium  Date/Time Value Range Status  02/21/2012 10:06 AM 4.0  3.5 - 5.1 mEq/L Final  02/13/2012 11:30 AM 4.5  3.5 - 5.1 mEq/L Final  02/10/2012  3:16 PM 4.7  3.5 - 5.3 mEq/L Final   Creatinine, Ser  Date/Time Value Range Status  02/21/2012 10:06 AM 1.2  0.4 - 1.2 mg/dL Final  1/32/4401 02:72 AM 1.8* 0.4 - 1.2 mg/dL Final  5/36/6440 3.47      Past Medical History:  1. Hypertension  2. Depression  3. DM  4. Obesity  5. GERD  6. CAD: Unstable angina 2/08. PCI to distal codominant CFX with 2.5 x 20 Taxus DES. Lexiscan myoview (3/12): chest pain and dyspnea with stress, EF 62%, small fixed lateral perfusion defect with significant surrounding reversible perfusion defect suggestive of significant ischemia, ischemic ECG response. LHC (4/12): 80-90% mid and distal CFX stenosis before and after prior-placed stent, total occlusion of a moderate 1st OM. Patient had DES x 2 to  CFX. Lexiscan myoview (3/13) with EF 68%, moderate partially reversible lateral defect c/w prior MI/peri-infarct ischemia. Defect less marked than on the prior study.  7. Fibromyalgia  8. Diastolic CHF: Echo (3/12) with EF 55-60%, mild LV hypertrophy, mild aortic stenosis (mean gradient 13 mmHg), mild MR, normal RV size and systolic function, PA systolic pressure 55 mmHg. Echo (2/13): EF 60%, mild LVH, moderate diastolic dysfunction, mild AI, aortic sclerosis, mild MR, mild RV dilation and  dysfunction, PASP 61 mmHg.  9. Aortic stenosis: Minimal by most recent echo.  10. Diabetic retinopathy  11. H/o vertigo  12. Peripheral neuropathy likely from diabetes.  13. OHS/OSA: Severe OSA on sleep study. Patient is off CPAP currently b/c she could not tolerate the mask.   Current Outpatient Prescriptions  Medication Sig Dispense Refill  . albuterol (PROAIR HFA) 108 (90 BASE) MCG/ACT inhaler Inhale 2 puffs into the lungs every 6 (six) hours as needed. For shortness of breath.      Marland Kitchen aspirin EC 81 MG EC tablet Take 1 tablet (81 mg total) by mouth daily.      Marland Kitchen atorvastatin (LIPITOR) 40 MG tablet Take 20 mg by mouth at bedtime.       . bisoprolol (ZEBETA) 5 MG tablet Take 1 tablet (5 mg total) by mouth daily.  30 tablet  0  . buPROPion (WELLBUTRIN SR) 150 MG 12 hr tablet Take 150 mg by mouth 2 (two) times daily.      . clopidogrel (PLAVIX) 75 MG tablet Take 75 mg by mouth daily.      . DULoxetine (CYMBALTA) 60 MG capsule Take 60 mg by mouth daily.      . furosemide (LASIX) 20 MG tablet Take 20 mg by mouth daily.       Marland Kitchen glipiZIDE (GLUCOTROL) 5 MG tablet Take 5 mg by mouth daily.      . insulin glargine (LANTUS) 100 UNIT/ML injection Inject 45 Units into the skin at bedtime.      Marland Kitchen lisinopril (PRINIVIL,ZESTRIL) 20 MG tablet Take 1 tablet (20 mg total) by mouth daily.      . metFORMIN (GLUCOPHAGE) 1000 MG tablet Take 1,000 mg by mouth 2 (two) times daily.        . Multiple Vitamin (MULTIVITAMIN) tablet Take 1 tablet by mouth daily.        . pregabalin (LYRICA) 75 MG capsule Take 75 mg by mouth 2 (two) times daily.        Marland Kitchen rOPINIRole (REQUIP) 2 MG tablet Take 2 mg by mouth at bedtime.       . traMADol (ULTRAM) 50 MG tablet Take 50-100 mg by mouth 2 (two) times daily as needed. For pain.      . traZODone (DESYREL) 50 MG tablet Take 50-100 mg by mouth at bedtime as needed. For sleep.      Marland Kitchen zolpidem (AMBIEN) 10 MG tablet Take 10 mg by mouth at bedtime as needed. For sleep.      Marland Kitchen DISCONTD:  simvastatin (ZOCOR) 40 MG tablet Take 40 mg by mouth every evening.        Allergies: No Known Allergies  History  Substance Use Topics  . Smoking status: Never Smoker   . Smokeless tobacco: Never Used  . Alcohol Use: No    PHYSICAL EXAM: VS:  BP 106/59  Pulse 69  Ht 5\' 6"  (1.676 m)  Wt 248 lb 12.8 oz (112.855 kg)  BMI 40.16 kg/m2 Well nourished, well developed, in no acute distress HEENT: normal  Neck: no JVD at 90 degrees Cardiac:  normal S1, S2; RRR; no murmur Lungs:  Decreased breath sounds bilaterally, no wheezing, rhonchi or rales Abd: soft, nontender Ext: Very trace bilateral LAD edema Skin: warm and dry Neuro:  CNs 2-12 intact, no focal abnormalities noted   ASSESSMENT AND PLAN:  1. Chronic diastolic CHF Volume stable on current medical therapy. Recent creatinine back to baseline. Continue current medications. I've asked her to continue to weigh herself daily and to contact us if she has increased weight, increased edema or increased shortness of breath. She will followup with Dr. Shirlee Latch in September as scheduled or sooner when necessary.  2. CAD No angina. Continue dual antiplatelet therapy. Followup in September as noted.  3. Renal insufficiency Resolved on most recent blood work done yesterday.  4. Hypertension Controlled.  Continue current therapy.   Signed, Tereso Newcomer, PA-C  2:32 PM 02/22/2012

## 2012-04-12 ENCOUNTER — Ambulatory Visit (INDEPENDENT_AMBULATORY_CARE_PROVIDER_SITE_OTHER): Payer: Medicare Other | Admitting: Cardiology

## 2012-04-12 ENCOUNTER — Encounter: Payer: Self-pay | Admitting: Cardiology

## 2012-04-12 VITALS — BP 138/52 | HR 70 | Resp 18 | Ht 66.0 in | Wt 248.4 lb

## 2012-04-12 DIAGNOSIS — I509 Heart failure, unspecified: Secondary | ICD-10-CM

## 2012-04-12 DIAGNOSIS — G4733 Obstructive sleep apnea (adult) (pediatric): Secondary | ICD-10-CM

## 2012-04-12 DIAGNOSIS — I2510 Atherosclerotic heart disease of native coronary artery without angina pectoris: Secondary | ICD-10-CM

## 2012-04-12 DIAGNOSIS — E785 Hyperlipidemia, unspecified: Secondary | ICD-10-CM

## 2012-04-12 LAB — BASIC METABOLIC PANEL
CO2: 28 mEq/L (ref 19–32)
Chloride: 90 mEq/L — ABNORMAL LOW (ref 96–112)
Creatinine, Ser: 1.5 mg/dL — ABNORMAL HIGH (ref 0.4–1.2)
Potassium: 3.8 mEq/L (ref 3.5–5.1)

## 2012-04-12 NOTE — Patient Instructions (Addendum)
Use your oxygen at night and with exertion.   Your physician recommends that you have  lab work today--BMET   Your physician wants you to follow-up in: 4 months with Dr Shirlee Latch. (January 2014).  You will receive a reminder letter in the mail two months in advance. If you don't receive a letter, please call our office to schedule the follow-up appointment  Your physician recommends that you have  a FASTING lipid profile /BMET in 4 months when you see Dr Shirlee Latch.

## 2012-04-12 NOTE — Progress Notes (Signed)
Patient ID: Krista Wise, female   DOB: Jul 19, 1946, 66 y.o.   MRN: [id number] PCP: Krista Wise  66 yo with history of CAD, DM, HTN, OHS/OSA, and diastolic CHF presents for followup. She had unstable angina in 2/08 with a stent placed in her distal CFX. I saw her earlier this year due to severe exertional dyspnea (short of breath just walking around her house). She was volume overloaded on exam and hypertensive. I uptitrated her BP meds and started Lasix. She was set up for myoview and echo. The myoview was ischemic by both ECG and perfusion images. There was significant lateral ischemia. Echo showed preserved EF (55-60%), but there was moderate pulmonary hypertension suggesting likely diastolic LV dysfunction.  I took her to the cath lab in 4/12 where left heart cath showed 80-90% mid and distal circumflex stenoses before and after the prior-placed stent and a totally occluded moderate-sized OM1.  She received 2 drug eluting stents in the CFX.   In 2/13, she was admitted with hypercarbic respiratory failure requiring bipap.  She was thought to have acute bronchitis with reactive airways disease and decompensation of her baseline poor respiratory status.  She also had acute on chronic diastolic CHF.  Her beta blocker was changed to bisoprolol because of wheezing.  Mildly elevated troponin was thought to be due to demand ischemia.  She lost 11 lbs in the hospital with diuresis.  She wears oxygen with exertion but is not using CPAP (it is not comfortable for her).  Lexiscan myoview done after this hospitalization showed EF 68%, moderate partially reversible lateral defect that actually looked better than the prior myoview.    Recently, Krista Wise has been doing well.  Weight is stable.  Her BP meds were decreased this summer in the setting multiple falls thought to have an orthostatic component.  She denies significant lightheadedness and has not fallen recently.  She completed PT for balance training.   Breathing is stable.  [name] short of breath with stairs or walking longer distances (ok walking in her house).  Oxygen saturation is 94% at rest today (she is not wearing oxygen).   No chest pain.   Labs (3/12): BNP 417 => 309, K 4.4, creatinine 0.7, HCT 44, free T3/T4 normal, TSH normal, HDL 38, LDL 96.5  Labs (5/12): BNP 422 => 160, K 3.9, creatinine 0.9, HDL 33, LDL 84 Labs (6/12): LDL 50, HDL 34, TGs 197 Labs (7/12): K 4.1, creatinine 1.1 Labs (11/12): K 3.9, creatinine 1.4, BNP 91 Labs (2/13): K 3.6, creatinine 0.92 Labs (3/13): K 5.5 => 4, creatinine 2.2 => 0.9, HDL 33, TGs 335, LDL 54, BNP 76 Labs (7/13): K 4, creatinine 1.2  Allergies (verified):  No Known Drug Allergies   Family History:  Father: Deceased; MI in his 64s, CABG, CHF  Mother: Alive; HTN, rhematoid arthritis, anemic   Social History:  Married, lives with husband in [place]  disabled: diabetes & fibromyagia  Tobacco Use - No.  Alcohol Use - no  Regular Exercise - no   Past Medical History:  1. Hypertension  2. Depression  3. DM  4. Obesity  5. GERD  6. CAD: Unstable angina 2/08. PCI to distal codominant CFX with 2.5 x 20 Taxus DES. Lexiscan myoview (3/12): chest pain and dyspnea with stress, EF 62%, small fixed lateral perfusion defect with significant surrounding reversible perfusion defect suggestive of significant ischemia, ischemic ECG response.  LHC (4/12): 80-90% mid and distal CFX stenosis before and after prior-placed stent,  total occlusion of a moderate 1st OM.  Patient had DES x 2 to CFX.   Lexiscan myoview (3/13) with EF 68%, moderate partially reversible lateral defect c/w prior MI/peri-infarct ischemia.  Defect less marked than on the prior study.  7. Fibromyalgia  8. Diastolic CHF: Echo (3/12) with EF 55-60%, mild LV hypertrophy, mild aortic stenosis (mean gradient 13 mmHg), mild MR, normal RV size and systolic function, PA systolic pressure 55 mmHg.  Echo (2/13): EF 60%, mild LVH, moderate  diastolic dysfunction, mild AI, aortic sclerosis, mild MR, mild RV dilation and dysfunction, PASP 61 mmHg.  9. Aortic stenosis: Minimal by most recent echo.  10.  Diabetic retinopathy 11.  H/o vertigo 12.  Peripheral neuropathy likely from diabetes.  13. OHS/OSA: Severe OSA on sleep study.  Patient is off CPAP currently b/c she could not tolerate the mask.    Current Outpatient Prescriptions  Medication Sig Dispense Refill  . albuterol (PROAIR HFA) 108 (90 BASE) MCG/ACT inhaler Inhale 2 puffs into the lungs every 6 (six) hours as needed. For shortness of breath.      Marland Kitchen aspirin EC 81 MG EC tablet Take 1 tablet (81 mg total) by mouth daily.      Marland Kitchen atorvastatin (LIPITOR) 40 MG tablet Take 20 mg by mouth at bedtime.       . bisoprolol (ZEBETA) 5 MG tablet Take 1 tablet (5 mg total) by mouth daily.  30 tablet  0  . buPROPion (WELLBUTRIN SR) 150 MG 12 hr tablet Take 150 mg by mouth 2 (two) times daily.      . clopidogrel (PLAVIX) 75 MG tablet Take 75 mg by mouth daily.      . DULoxetine (CYMBALTA) 60 MG capsule Take 60 mg by mouth daily.      . furosemide (LASIX) 20 MG tablet Take 20 mg by mouth daily.       Marland Kitchen glipiZIDE (GLUCOTROL) 5 MG tablet Take 5 mg by mouth daily.      . insulin glargine (LANTUS) 100 UNIT/ML injection Inject 45 Units into the skin at bedtime.      Marland Kitchen lisinopril (PRINIVIL,ZESTRIL) 20 MG tablet Take 1 tablet (20 mg total) by mouth daily.      . metFORMIN (GLUCOPHAGE) 1000 MG tablet Take 1,000 mg by mouth 2 (two) times daily.        . Multiple Vitamin (MULTIVITAMIN) tablet Take 1 tablet by mouth daily.        . pregabalin (LYRICA) 75 MG capsule Take 75 mg by mouth 2 (two) times daily.        Marland Kitchen rOPINIRole (REQUIP) 2 MG tablet Take 2 mg by mouth at bedtime.       . traMADol (ULTRAM) 50 MG tablet Take 50-100 mg by mouth 2 (two) times daily as needed. For pain.      . traZODone (DESYREL) 50 MG tablet Take 50-100 mg by mouth at bedtime as needed. For sleep.      Marland Kitchen zolpidem (AMBIEN)  10 MG tablet Take 10 mg by mouth at bedtime as needed. For sleep.      Marland Kitchen DISCONTD: simvastatin (ZOCOR) 40 MG tablet Take 40 mg by mouth every evening.        BP 138/52  Pulse 70  Resp 18  Ht 5\' 6"  (1.676 m)  Wt 248 lb 6.4 oz (112.674 kg)  BMI 40.09 kg/m2  SpO2 94% General: Well developed, well nourished, in no acute distress. Obese.  Neck: Neck supple, JVP 7 cm. No  masses, thyromegaly or abnormal cervical nodes.  Lungs: Clear bilaterally  Heart: Non-displaced PMI, chest non-tender; regular rate and rhythm, S1, S2 without rubs or gallops. 2/6 early systolic murmur RUSB. Carotid upstroke normal, no bruit. Pedals normal pulses. Trace ankle edema.  Abdomen: Bowel sounds positive; abdomen soft and non-tender without masses, organomegaly, or hernias noted. No hepatosplenomegaly.  Extremities: No clubbing or cyanosis.  Neurologic: Alert and oriented x 3.   Assessment/Plan: 1. CAD: No chest pain, stable exertional dyspnea.  Continue ASA 81, statin, Plavix, bisoprolol, lisinopril.  2. Chronic diastolic CHF: Volume stable, no significantly overloaded.  Continue Lasix at current dose and check BMET today.  3. Hyperlipidemia: Check lipids at 4 month followup, goal LDL < 70.  4. HTN: BP controlled.  Meds have been cut back due to orthostatic symptoms and falls.  5. OHS/OSA: Unable to tolerate CPAP.  She should wear oxygen with exertion and at night.   [name] 04/12/2012

## 2012-07-11 ENCOUNTER — Other Ambulatory Visit: Payer: Self-pay | Admitting: *Deleted

## 2012-07-11 MED ORDER — METFORMIN HCL 1000 MG PO TABS: 1000.0000 mg | ORAL_TABLET | Freq: Two times a day (BID) | ORAL | Status: DC

## 2012-07-31 ENCOUNTER — Telehealth: Payer: Self-pay | Admitting: Cardiology

## 2012-07-31 NOTE — Telephone Encounter (Signed)
She probably should be using it at night if she is not on CPAP.

## 2012-07-31 NOTE — Telephone Encounter (Signed)
Looks like Dr Shelle Iron manages this for pt--he was going to order overnight oxygen testing in May 2013. He also manages her CPAP.  I will ask pt to follow-up with Dr Shelle Iron for his recommendation about discontinuing oxygen. LMTCB for pt.

## 2012-07-31 NOTE — Telephone Encounter (Signed)
Pt wants to cancel home O2 because she has not used it for 4-5 months and she still has to pay for it so please notify [facility]

## 2012-10-15 ENCOUNTER — Other Ambulatory Visit: Payer: Self-pay | Admitting: Cardiology

## 2013-01-01 ENCOUNTER — Telehealth: Payer: Self-pay | Admitting: Cardiology

## 2013-01-01 NOTE — Telephone Encounter (Signed)
I spoke with pt's daughter

## 2013-01-01 NOTE — Telephone Encounter (Signed)
New Problem:    Patient called in wanting to speak with you about receiving surgical clearance before her upcoming back surgery and wanted to know when she should stop her blood thinners.  Please call back.

## 2013-01-01 NOTE — Telephone Encounter (Signed)
She needs to be seen in the office by me or PA.  [name] due for appt.

## 2013-01-01 NOTE — Telephone Encounter (Signed)
Walk in pt Form " Pt Having Back Surgery, Need ASAP Appt  Gave to Ernestine Mcmurray  01/01/13/KM

## 2013-01-01 NOTE — Telephone Encounter (Signed)
Spoke with patient's husband. Pt is having severe back pain. She has been told that she needs surgery by Dr Ophelia Charter to have rods in her back to relieve her back pain.

## 2013-01-01 NOTE — Telephone Encounter (Signed)
Pt currently taking Plavix and aspirin. Pt's husband asking if it is OK for pt to hold plavix and /or aspirin prior to back surgery. Pt's husband also asking if Dr Shirlee Latch will clear her for surgery. I will forward to Dr Shirlee Latch for review.

## 2013-01-01 NOTE — Telephone Encounter (Signed)
Pt advised. Pt has an appt 01/08/13 with Sunday Spillers.

## 2013-01-08 ENCOUNTER — Ambulatory Visit (INDEPENDENT_AMBULATORY_CARE_PROVIDER_SITE_OTHER): Payer: Medicare Other | Admitting: Nurse Practitioner

## 2013-01-08 ENCOUNTER — Encounter: Payer: Self-pay | Admitting: Nurse Practitioner

## 2013-01-08 VITALS — BP 150/66 | HR 57 | Ht 66.0 in | Wt 236.8 lb

## 2013-01-08 DIAGNOSIS — I509 Heart failure, unspecified: Secondary | ICD-10-CM

## 2013-01-08 NOTE — Progress Notes (Signed)
Krista Wise Date of Birth: 1945-08-06 Medical Record #[id number]  History of Present Illness: Krista Wise is seen back today for a pre op visit. Seen for Dr. Shirlee Latch. She has a history of CAD with stent to the distal LCX in 2008, Dm, HTN, OHS/OSA, and diastolic CHF. She was recathed due to abnormal Myoview and echo back in April of 2012 which showed an 80 to 90% mid and distal LCX stenoses before and after the prior placed stent and a totally occluded moderate sized OM1. At that time she had DES x 2 placed.   Other issues include respiratory failure that required admission and bipap in February of 2013. Repeat Myoview at that time showed her EF to be 68%, moderate partially reversible lateral defect that actually looked better than her prior study of 2012. She also has diabetes, COPD, HTN, depression, obesity, GERD, fibromyalgia, minimal AS, and neuropathy.   Last seen in September of 2013 - was doing ok. Remains short of breath as a general baseline.   Comes in today. [name] here with her husband. In a wheelchair. Wanting to have back surgery per Dr. Ophelia Charter (rods in her back) with general anesthesia. She says she is doing well. No chest pain. Not short of breath. [name] not active because of her back and because "I'm just not active". No swelling. No longer on oxygen. Wants to send her concentrator back. Does not see pulmonary. Weight is up and down. Sugars are uncontrolled per her report. Now sees [name] for her primary care. Tolerating her medicines. She has stopped her aspirin and Plavix over a week ago in anticipation of surgery on her own.   Current Outpatient Prescriptions on File Prior to Visit  Medication Sig Dispense Refill  . atorvastatin (LIPITOR) 40 MG tablet Take 40 mg by mouth at bedtime.       . bisoprolol (ZEBETA) 5 MG tablet Take 1 tablet (5 mg total) by mouth daily.  30 tablet  0  . buPROPion (WELLBUTRIN SR) 150 MG 12 hr tablet Take 150 mg by mouth 2 (two) times daily.       . DULoxetine (CYMBALTA) 60 MG capsule Take 60 mg by mouth daily.      . furosemide (LASIX) 20 MG tablet Take 40 mg by mouth 2 (two) times daily.       . insulin glargine (LANTUS) 100 UNIT/ML injection Inject 40 Units into the skin at bedtime.       Marland Kitchen lisinopril (PRINIVIL,ZESTRIL) 20 MG tablet Take 1 tablet (20 mg total) by mouth daily.      . metFORMIN (GLUCOPHAGE) 1000 MG tablet Take 1 tablet (1,000 mg total) by mouth 2 (two) times daily.  60 tablet  5  . Multiple Vitamin (MULTIVITAMIN) tablet Take 1 tablet by mouth daily.        . pregabalin (LYRICA) 75 MG capsule Take 75 mg by mouth 2 (two) times daily.        Marland Kitchen rOPINIRole (REQUIP) 2 MG tablet Take 2 mg by mouth at bedtime.       . traMADol (ULTRAM) 50 MG tablet Take 50-100 mg by mouth 2 (two) times daily as needed. For pain.      . traZODone (DESYREL) 50 MG tablet Take 50-100 mg by mouth at bedtime as needed. For sleep.      Marland Kitchen zolpidem (AMBIEN) 10 MG tablet Take 10 mg by mouth at bedtime as needed. For sleep.      . [DISCONTINUED] simvastatin (  ZOCOR) 40 MG tablet Take 40 mg by mouth every evening.       No current facility-administered medications on file prior to visit.    No Known Allergies  Family History:  Father: Deceased; MI in his 29s, CABG, CHF  Mother: Alive; HTN, rhematoid arthritis, anemic   Social History:  Married, lives with husband in [place]  disabled: diabetes & fibromyagia  Tobacco Use - No.  Alcohol Use - no  Regular Exercise - no   Past Medical History:  1. Hypertension  2. Depression  3. DM  4. Obesity  5. GERD  6. CAD: Unstable angina 2/08. PCI to distal codominant CFX with 2.5 x 20 Taxus DES. Lexiscan myoview (3/12): chest pain and dyspnea with stress, EF 62%, small fixed lateral perfusion defect with significant surrounding reversible perfusion defect suggestive of significant ischemia, ischemic ECG response. LHC (4/12): 80-90% mid and distal CFX stenosis before and after prior-placed stent,  total occlusion of a moderate 1st OM. Patient had DES x 2 to CFX. Lexiscan myoview (3/13) with EF 68%, moderate partially reversible lateral defect c/w prior MI/peri-infarct ischemia. Defect less marked than on the prior study.  7. Fibromyalgia  8. Diastolic CHF: Echo (3/12) with EF 55-60%, mild LV hypertrophy, mild aortic stenosis (mean gradient 13 mmHg), mild MR, normal RV size and systolic function, PA systolic pressure 55 mmHg. Echo (2/13): EF 60%, mild LVH, moderate diastolic dysfunction, mild AI, aortic sclerosis, mild MR, mild RV dilation and dysfunction, PASP 61 mmHg.  9. Aortic stenosis: Minimal by most recent echo.  10. Diabetic retinopathy  11. H/o vertigo  12. Peripheral neuropathy likely from diabetes.  13. OHS/OSA: Severe OSA on sleep study. Patient is off CPAP currently b/c she could not tolerate the mask.    Past Surgical History  Procedure Laterality Date  . Coronary stent placement  08/2006    CAD; stent placed @ [facility]  . Total abdominal hysterectomy      History  Smoking status  . Never Smoker   Smokeless tobacco  . Never Used    History  Alcohol Use No    Review of Systems: The review of systems is per the HPI.  All other systems were reviewed and are negative.  Physical Exam: BP 150/66  Pulse 57  Ht 5\' 6"  (1.676 m)  Wt 236 lb 12.8 oz (107.412 kg)  BMI 38.24 kg/m2 Patient is alert, appears chronically ill but in no acute distress. Skin is warm and dry. Color is normal.  HEENT is unremarkable. Normocephalic/atraumatic. PERRL. Sclera are nonicteric. Neck is supple. No masses. No JVD. Lungs are clear. Cardiac exam shows a regular rate and rhythm. Abdomen is soft. Extremities are without edema. Gait and ROM are intact. No gross neurologic deficits noted.  LABORATORY DATA: EKG today shows sinus rhythm with nonspecific changes. Tracing is reviewed with Dr. Shirlee Latch.    Lab Results  Component Value Date   WBC 5.9 08/31/2011   HGB 10.9* 08/31/2011   HCT 35.2*  08/31/2011   PLT 189 08/31/2011   GLUCOSE 482* 04/12/2012   CHOL 131 10/05/2011   TRIG 335.0* 10/05/2011   HDL 33.50* 10/05/2011   LDLDIRECT 54.5 10/05/2011   LDLCALC 50 01/21/2011   ALT 9 08/30/2011   AST 16 08/30/2011   NA 133* 04/12/2012   K 3.8 04/12/2012   CL 90* 04/12/2012   CREATININE 1.5* 04/12/2012   BUN 21 04/12/2012   CO2 28 04/12/2012   TSH 3.456 08/31/2011   INR 1.0 10/21/2010  HGBA1C 9.1* 08/29/2011   Echo Study Conclusions from February 2013  - Left ventricle: Technically difficult study. The cavity size was normal. Wall thickness was increased in a pattern of mild LVH. The estimated ejection fraction was 60%. Regional wall motion abnormalities cannot be excluded. Findings consistent with left ventricular diastolic dysfunction. Doppler parameters are consistent with high ventricular filling pressure. - Aortic valve: Sclerosis without stenosis. Mild regurgitation. - Mitral valve: Mild regurgitation. - Left atrium: The atrium was mildly dilated. - Right ventricle: The cavity size was mildly dilated. Systolic function was mildly reduced. - Pulmonary arteries: PA peak pressure: 61mm Hg (S).  Myoview Impression from March 2013  Exercise Capacity: Lexiscan with no exercise.  BP Response: Normal blood pressure response.  Clinical Symptoms: There is dyspnea.  ECG Impression: No significant ST segment change suggestive of ischemia.  Comparison with Prior Nuclear Study: Lateral ischemia is improved compared to previous   Overall Impression: Abnormal stress nuclear study with a small, moderate severity, anterior, fixed defect suggestive of soft tissue attenuation; there is also a medium size, moderate, partially reversible lateral defect consistent with prior lateral infarct and mild peri-infarct ischemia.  [name]   Probably improved from prior. No cath. Will see back in office as scheduled.  Dalton McLean  Assessment / Plan:  1. Pre op clearance - patient's case is discussed  with Dr. Shirlee Latch here in the office. He feels that she is an acceptable candidate but would be considered high risk given her cardiac issues and comorbidities. She has already stopped her Plavix and aspirin for over a week on her own. If her surgery is still a few weeks away, she is to restart her Plavix/aspirin. She seems to feel that her surgery is this week.   2. CAD - past stents x 3 total to the LCX - last 2 placed in April of 2012 - no symptoms reported. Further Myoview not felt to be warranted given the fact that prior studies are abnormal.   3. Chronic diastolic HF - EF is normal. Seems compensated on her current regimen.   4. HTN - BP is better at home per her report.   5. Chronic back pain  We will see her back in 4 months. Her oxygen level was checked and was 95%. I have asked her not return the oxygen concentrator until after she recovers from her upcoming surgery.   Patient is agreeable to this plan and will call if any problems develop in the interim.   Rosalio Macadamia, RN, ANP-C [facility] [address] [place], [place]  [address]

## 2013-01-08 NOTE — Patient Instructions (Addendum)
Stay on your current medicines  Dr. Shirlee Latch has given clearance for your surgery - we do feel you are high risk - but an acceptable candidate for surgery. We will be available as needed.   If your surgery is not for several more weeks, restart your aspirin and Plavix  See Dr. Shirlee Latch in 4 months  Call the [facility] office at [phone] if you have any questions, problems or concerns.

## 2013-01-18 ENCOUNTER — Encounter (HOSPITAL_COMMUNITY): Payer: Self-pay

## 2013-01-21 ENCOUNTER — Encounter (HOSPITAL_COMMUNITY)
Admission: RE | Admit: 2013-01-21 | Discharge: 2013-01-21 | Disposition: A | Discharge status: Home / Self Care | Payer: Medicare Other | Source: Ambulatory Visit | Attending: Orthopaedic Surgery | Admitting: Orthopaedic Surgery

## 2013-01-21 ENCOUNTER — Encounter (HOSPITAL_COMMUNITY): Payer: Self-pay

## 2013-01-21 HISTORY — DX: Unspecified osteoarthritis, unspecified site: M19.90

## 2013-01-21 HISTORY — DX: Sleep apnea, unspecified: G47.30

## 2013-01-21 HISTORY — DX: Heart failure, unspecified: I50.9

## 2013-01-21 HISTORY — DX: Headache: R51

## 2013-01-21 LAB — BASIC METABOLIC PANEL
Calcium: 8.5 mg/dL (ref 8.4–10.5)
GFR calc Af Amer: 57 mL/min — ABNORMAL LOW (ref >90)
GFR calc non Af Amer: 49 mL/min — ABNORMAL LOW (ref >90)
Glucose, Bld: 461 mg/dL — ABNORMAL HIGH (ref 70–99)
Potassium: 3.5 mEq/L (ref 3.5–5.1)
Sodium: 136 mEq/L (ref 135–145)

## 2013-01-21 LAB — TYPE AND SCREEN
ABO/RH(D): O POS
Antibody Screen: NEGATIVE

## 2013-01-21 LAB — CBC
Hemoglobin: 12.4 g/dL (ref 12.0–15.0)
MCH: 27 pg (ref 26.0–34.0)
Platelets: 195 10*3/uL (ref 150–400)
RBC: 4.6 MIL/uL (ref 3.87–5.11)
WBC: 8 10*3/uL (ref 4.0–10.5)

## 2013-01-21 LAB — SURGICAL PCR SCREEN
MRSA, PCR: NEGATIVE
Staphylococcus aureus: POSITIVE — AB

## 2013-01-21 LAB — ABO/RH: ABO/RH(D): O POS

## 2013-01-21 NOTE — Pre-Procedure Instructions (Signed)
Krista Wise  01/21/2013   Your procedure is scheduled on:  02/01/13  Report to [facility] at 820 AM.  Call this number if you have problems the morning of surgery: [phone]   Remember:   Do not eat food or drink liquids after midnight.   Take these medicines the morning of surgery with A SIP OF WATER: **bisoprolol,wellbutrin,cymbalta,hydrocodone,protonix   Do not wear jewelry, make-up or nail polish.  Do not wear lotions, powders, or perfumes. You may wear deodorant.  Do not shave 48 hours prior to surgery. Men may shave face and neck.  Do not bring valuables to the hospital.  [facility] is not responsible                   for any belongings or valuables.  Contacts, dentures or bridgework may not be worn into surgery.  Leave suitcase in the car. After surgery it may be brought to your room.  For patients admitted to the hospital, checkout time is 11:00 AM the day of  discharge.   Patients discharged the day of surgery will not be allowed to drive  home.  Name and phone number of your driver: family  Special Instructions: Shower using CHG 2 nights before surgery and the night before surgery.  If you shower the day of surgery use CHG.  Use special wash - you have one bottle of CHG for all showers.  You should use approximately 1/3 of the bottle for each shower.   Please read over the following fact sheets that you were given: Pain Booklet, Coughing and Deep Breathing, Blood Transfusion Information, MRSA Information and Surgical Site Infection Prevention

## 2013-01-23 MED ORDER — ASPIRIN 81 MG PO CHEW
CHEWABLE_TABLET | ORAL | Status: AC
  Filled 2013-01-23: qty 4

## 2013-01-23 MED ORDER — DIAZEPAM 5 MG PO TABS
ORAL_TABLET | ORAL | Status: AC
  Filled 2013-01-23: qty 1

## 2013-01-23 NOTE — H&P (Signed)
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   HISTORY OF PRESENT ILLNESS:  This 67 year old female returns.  She has had progressive neurogenic claudication. She reports she can walk only 50 feet.  She had myelographic  block and states that since I last saw her she states she cannot control her bladder anymore.  She states she occasionally has some urgency with her bowels but still is able to control them.  [name] ambulatory with the rolling walker and can stand for about a minute and a half to two minutes.  Her legs get weak and force her to sit down.  She had 10 mm of anterolisthesis with shifting on flexion and extension.  Central bulge at 4-5 with foraminal stenosis, facet overgrowth.  She had multiple stents placed back in 2012.  I have not seen her in over a year and she states that in the last 2 months now that her bladder is not being able to be controlled she wants to proceed with surgical intervention.  [name] off the Plavix.  Last clearance was from cardiology a year ago and she would need to have repeat clearance obtained.     CURRENT MEDICATIONS:  [name] on a significant list of medications and she does not have them with her. Previously she was on carvedilol, glipizide, Lasix 40 mg 1 a day, metformin 1000 mg 2 a day, 1 [employer]ayer aspirin, atorvastatin 40 mg daily, Cymbalta 60 mg daily, lisinopril 20 mg 2 at bedtime, amlodipine 5 mg daily.  [name] a diabetic and was on Lantus 90 units and Humalog 30 units, ProAir 2 puffs daily and bupropion 150 mg b.i.d., terazosin p.r.n. at night.  She will need to get her new list of medications to [place].   FAMILY  HISTORY:  Positive for diabetes, heart disease, hypertension.     SOCIAL HISTORY:  Patient is retired.  Married to her husband Krista Wise.  Does not smoke or drink.   REVIEW OF SYSTEMS:  Positive for acid reflux, arthritis, bladder problems, depression, diabetes, heart disease, hypertension, rheumatic fever and sleep apnea.   PHYSICAL EXAMINATION:  Patient is alert, oriented, 5 feet 5 inches, 220 pounds.  Extraocular movements intact.  No carotid bruits.  No accessory muscle inspiratory effort.  Heart is regular without murmur.  She has trace lower extremity reflexes.  EHL anterior tib is active.  She has overall weakness of quads, hamstrings, foot dorsiflexion and plantarflexion.  She has some decrease sensation in her feet consistent with diabetic neuropathy.  She states the insulin she was taking was Lantus 90 units daily and she uses Humalog 40 units with meals.   ASSESSMENT:  Two level severe stenosis with complete myelographic block, spondylolisthesis, lateral recess and foraminal stenosis L4-5 and L5-S1.    PLAN:  A 2-level instrumented fusion with decompression for severe stenosis.  I discussed with her that now that she has developed some incontinence for several months that she may not get return of bladder function even with decompression.  This is something that I discussed with her over last couple of years.         Mark C. Ophelia Charter, M.D.  Auto-Authenticated by Veverly Fells. Ophelia Charter, M.D.

## 2013-01-23 NOTE — Progress Notes (Addendum)
Anesthesia chart review: Patient is a 67 year old female posted for L4-5, L5-S1 TLIF on 02/01/13 by Dr. Ophelia Charter.  History includes non-smoker, CAD s/p distal LCx stent '08 and DES Cx X 2 10/2010, diastolic CHF, HTN, DM2, GERD, OSA not tolerating CPAP mask, obesity, headaches, depression, arthritis.  She was admitted for acute respiratory failure with acute on chronic CHF in 08/2011 and required Bi-PAP.  PCP is [name], NP.  Cardiologist is Dr. Shirlee Latch.  She was seen for preoperative clearance by Norma Fredrickson, NP on 01/08/13 and case was also discussed with Dr. Shirlee Latch that day.  He felt that "she is an acceptable candidate but would be considered high risk given her cardiac issues and comorbidities."  Patient had already held her ASA and Plavix.  EKG on 01/08/13 showed SB, LAD, non-specific ST abnormality.  Echo Study Conclusions from February 2013 - Left ventricle: Technically difficult study. The cavity size was normal. Wall thickness was increased in a pattern of mild LVH. The estimated ejection fraction was 60%. Regional wall motion abnormalities cannot be excluded. Findings consistent with left ventricular diastolic dysfunction. Doppler parameters are consistent with high ventricular filling pressure. - Aortic valve: Sclerosis without stenosis. Mild regurgitation. - Mitral valve: Mild regurgitation. - Left atrium: The atrium was mildly dilated. - Right ventricle: The cavity size was mildly dilated. Systolic function was mildly reduced. - Pulmonary arteries: PA peak pressure: 61mm Hg (S).  Myoview Impression from March 2013  Exercise Capacity: Lexiscan with no exercise.  BP Response: Normal blood pressure response.  Clinical Symptoms: There is dyspnea.  ECG Impression: No significant ST segment change suggestive of ischemia.  Comparison with Prior Nuclear Study: Lateral ischemia is improved compared to previous  Overall Impression: Abnormal stress nuclear study with a small, moderate severity,  anterior, fixed defect suggestive of soft tissue attenuation; there is also a medium size, moderate, partially reversible lateral defect consistent with prior lateral infarct and mild peri-infarct ischemia.  Dr. Shirlee Latch felt results were probably improved from prior and did not recommend repeat cath at that time.  Continued medical therapy.   Cardiac cath on 10/26/10 showed EF 55%, no regional wall motion abnormalities, 30% mid and proximal RCA stenosis, 70% distal PDA stenosis, 40% mid LAD stenosis, 80% small D1, 90% small D2, total occlusion of a moderate sized OM1 close to the take off of the CX, 80-90% mid and distal CFX stenosis before and after prior-placed stent.  She subsequently successful PTCA and placement of 2 DES in a non-overlapping fashion in the mid CX.    CXR on 01/21/13 showed stable chronic cardiomegaly and vascular congestion. No acute findings demonstrated.  Labs from 01/21/13 noted.  Glucose is significantly elevated at 461.  Dr. Bonnita Hollow office is already aware and reports patient has an appointment with her PCP this week to optimize glucose control preoperatively.  I will attempt to get records and follow-up when available.  Velna Ochs [facility]Anesthesiology Phone [phone] 01/23/2013 12:45 PM  Addendum: 01/28/13 1110 I received the office note from [name], DNP, FNP from 01/23/13.  Patient's Lantus insulin was increased to 45 Units Q HS, and she was started on Novolog 7 Units meal coverage.  Glucose diary log and one month follow-up following recovery from back surgery was recommended.  If glucose control has improved and otherwise there are no acute changes or new CV symptoms then I would anticipate that she could proceed as planned.

## 2013-02-01 ENCOUNTER — Encounter (HOSPITAL_COMMUNITY)
Admission: RE | Disposition: A | Discharge status: Home Health | Payer: Self-pay | Source: Ambulatory Visit | Attending: Orthopaedic Surgery

## 2013-02-01 ENCOUNTER — Other Ambulatory Visit (HOSPITAL_COMMUNITY): Payer: Self-pay | Admitting: Orthopaedic Surgery

## 2013-02-01 ENCOUNTER — Inpatient Hospital Stay (HOSPITAL_COMMUNITY): Payer: Medicare Other | Admitting: Certified Registered"

## 2013-02-01 ENCOUNTER — Encounter (HOSPITAL_COMMUNITY): Payer: Self-pay | Admitting: Vascular Surgery

## 2013-02-01 ENCOUNTER — Inpatient Hospital Stay (HOSPITAL_COMMUNITY)
Admission: RE | Admit: 2013-02-01 | Discharge: 2013-02-07 | DRG: 460 | Disposition: A | Discharge status: Home Health | Payer: Medicare Other | Source: Ambulatory Visit | Attending: Orthopaedic Surgery | Admitting: Orthopaedic Surgery

## 2013-02-01 ENCOUNTER — Encounter (HOSPITAL_COMMUNITY): Payer: Self-pay | Admitting: Surgery

## 2013-02-01 ENCOUNTER — Inpatient Hospital Stay (HOSPITAL_COMMUNITY): Payer: Medicare Other

## 2013-02-01 DIAGNOSIS — I2510 Atherosclerotic heart disease of native coronary artery without angina pectoris: Secondary | ICD-10-CM | POA: Diagnosis present

## 2013-02-01 DIAGNOSIS — M4040 Postural lordosis, site unspecified: Secondary | ICD-10-CM | POA: Diagnosis present

## 2013-02-01 DIAGNOSIS — Z01818 Encounter for other preprocedural examination: Secondary | ICD-10-CM

## 2013-02-01 DIAGNOSIS — R32 Unspecified urinary incontinence: Secondary | ICD-10-CM | POA: Diagnosis present

## 2013-02-01 DIAGNOSIS — E118 Type 2 diabetes mellitus with unspecified complications: Secondary | ICD-10-CM

## 2013-02-01 DIAGNOSIS — F3289 Other specified depressive episodes: Secondary | ICD-10-CM | POA: Diagnosis present

## 2013-02-01 DIAGNOSIS — Z6838 Body mass index (BMI) 38.0-38.9, adult: Secondary | ICD-10-CM

## 2013-02-01 DIAGNOSIS — I10 Essential (primary) hypertension: Secondary | ICD-10-CM | POA: Diagnosis present

## 2013-02-01 DIAGNOSIS — G4730 Sleep apnea, unspecified: Secondary | ICD-10-CM | POA: Diagnosis present

## 2013-02-01 DIAGNOSIS — I509 Heart failure, unspecified: Secondary | ICD-10-CM | POA: Diagnosis present

## 2013-02-01 DIAGNOSIS — Z01812 Encounter for preprocedural laboratory examination: Secondary | ICD-10-CM

## 2013-02-01 DIAGNOSIS — E1149 Type 2 diabetes mellitus with other diabetic neurological complication: Secondary | ICD-10-CM | POA: Diagnosis present

## 2013-02-01 DIAGNOSIS — Q762 Congenital spondylolisthesis: Secondary | ICD-10-CM

## 2013-02-01 DIAGNOSIS — F329 Major depressive disorder, single episode, unspecified: Secondary | ICD-10-CM | POA: Diagnosis present

## 2013-02-01 DIAGNOSIS — K219 Gastro-esophageal reflux disease without esophagitis: Secondary | ICD-10-CM | POA: Diagnosis present

## 2013-02-01 DIAGNOSIS — Y839 Surgical procedure, unspecified as the cause of abnormal reaction of the patient, or of later complication, without mention of misadventure at the time of the procedure: Secondary | ICD-10-CM | POA: Diagnosis present

## 2013-02-01 DIAGNOSIS — M129 Arthropathy, unspecified: Secondary | ICD-10-CM | POA: Diagnosis present

## 2013-02-01 DIAGNOSIS — Z9861 Coronary angioplasty status: Secondary | ICD-10-CM

## 2013-02-01 DIAGNOSIS — M48062 Spinal stenosis, lumbar region with neurogenic claudication: Principal | ICD-10-CM | POA: Diagnosis present

## 2013-02-01 DIAGNOSIS — M4316 Spondylolisthesis, lumbar region: Secondary | ICD-10-CM | POA: Diagnosis present

## 2013-02-01 DIAGNOSIS — E1142 Type 2 diabetes mellitus with diabetic polyneuropathy: Secondary | ICD-10-CM | POA: Diagnosis present

## 2013-02-01 LAB — GLUCOSE, CAPILLARY
Glucose-Capillary: 255 mg/dL — ABNORMAL HIGH (ref 70–99)
Glucose-Capillary: 285 mg/dL — ABNORMAL HIGH (ref 70–99)

## 2013-02-01 SURGERY — POSTERIOR LUMBAR FUSION 2 LEVEL
Anesthesia: General | Site: Back | Wound class: Clean

## 2013-02-01 MED ORDER — MORPHINE SULFATE 2 MG/ML IJ SOLN
2.0000 mg | INTRAMUSCULAR | Status: DC | PRN
  Administered 2013-02-01: 2 mg via INTRAVENOUS
  Filled 2013-02-01: qty 1

## 2013-02-01 MED ORDER — ACETAMINOPHEN 325 MG PO TABS: 650.0000 mg | ORAL_TABLET | ORAL | Status: DC | PRN

## 2013-02-01 MED ORDER — EPHEDRINE SULFATE 50 MG/ML IJ SOLN
INTRAMUSCULAR | Status: DC | PRN
  Administered 2013-02-01: 20 mg via INTRAVENOUS
  Administered 2013-02-01: 10 mg via INTRAVENOUS
  Administered 2013-02-01: 20 mg via INTRAVENOUS

## 2013-02-01 MED ORDER — ACETAMINOPHEN 650 MG RE SUPP: 650.0000 mg | RECTAL | Status: DC | PRN

## 2013-02-01 MED ORDER — KETOROLAC TROMETHAMINE 30 MG/ML IJ SOLN
INTRAMUSCULAR | Status: AC
Start: 2013-02-01 — End: 2013-02-02
  Filled 2013-02-01: qty 1

## 2013-02-01 MED ORDER — ADULT MULTIVITAMIN W/MINERALS CH
1.0000 | ORAL_TABLET | Freq: Every day | ORAL | Status: DC
  Administered 2013-02-01 – 2013-02-07 (×7): 1 via ORAL
  Filled 2013-02-01 (×7): qty 1

## 2013-02-01 MED ORDER — DOCUSATE SODIUM 100 MG PO CAPS
100.0000 mg | ORAL_CAPSULE | Freq: Two times a day (BID) | ORAL | Status: DC
  Administered 2013-02-01 – 2013-02-07 (×12): 100 mg via ORAL
  Filled 2013-02-01 (×13): qty 1

## 2013-02-01 MED ORDER — LACTATED RINGERS IV SOLN
INTRAVENOUS | Status: DC
  Administered 2013-02-01 (×3): via INTRAVENOUS

## 2013-02-01 MED ORDER — HYDROCODONE-ACETAMINOPHEN 5-325 MG PO TABS
1.0000 | ORAL_TABLET | ORAL | Status: DC | PRN
  Administered 2013-02-04 (×2): 1 via ORAL
  Administered 2013-02-05 – 2013-02-06 (×6): 2 via ORAL
  Filled 2013-02-01 (×3): qty 2
  Filled 2013-02-01: qty 1
  Filled 2013-02-01: qty 2
  Filled 2013-02-01: qty 1
  Filled 2013-02-01 (×3): qty 2

## 2013-02-01 MED ORDER — SODIUM CHLORIDE 0.9 % IV SOLN
10.0000 mg | INTRAVENOUS | Status: DC | PRN
  Administered 2013-02-01: 25 ug/min via INTRAVENOUS

## 2013-02-01 MED ORDER — OXYCODONE-ACETAMINOPHEN 5-325 MG PO TABS
1.0000 | ORAL_TABLET | ORAL | Status: DC | PRN
  Administered 2013-02-02 – 2013-02-03 (×5): 2 via ORAL
  Administered 2013-02-03: 1 via ORAL
  Administered 2013-02-03 – 2013-02-06 (×3): 2 via ORAL
  Filled 2013-02-01 (×9): qty 2

## 2013-02-01 MED ORDER — POTASSIUM CHLORIDE 20 MEQ PO PACK
20.0000 meq | PACK | Freq: Every day | ORAL | Status: DC
  Filled 2013-02-01: qty 1

## 2013-02-01 MED ORDER — SODIUM CHLORIDE 0.9 % IV SOLN: 250.0000 mL | INTRAVENOUS | Status: DC

## 2013-02-01 MED ORDER — METHOCARBAMOL 500 MG PO TABS
500.0000 mg | ORAL_TABLET | Freq: Four times a day (QID) | ORAL | Status: DC | PRN
  Administered 2013-02-02 – 2013-02-06 (×8): 500 mg via ORAL
  Filled 2013-02-01 (×9): qty 1

## 2013-02-01 MED ORDER — MIDAZOLAM HCL 5 MG/5ML IJ SOLN
INTRAMUSCULAR | Status: DC | PRN
  Administered 2013-02-01: 2 mg via INTRAVENOUS

## 2013-02-01 MED ORDER — VITAMIN B-6 100 MG PO TABS
500.0000 mg | ORAL_TABLET | Freq: Every day | ORAL | Status: DC
  Administered 2013-02-01 – 2013-02-07 (×7): 500 mg via ORAL
  Filled 2013-02-01 (×7): qty 5

## 2013-02-01 MED ORDER — BISOPROLOL FUMARATE 5 MG PO TABS
5.0000 mg | ORAL_TABLET | Freq: Every day | ORAL | Status: DC
  Administered 2013-02-02 – 2013-02-07 (×6): 5 mg via ORAL
  Filled 2013-02-01 (×7): qty 1

## 2013-02-01 MED ORDER — SODIUM CHLORIDE 0.9 % IV SOLN
INTRAVENOUS | Status: DC | PRN
  Administered 2013-02-01: 14:00:00 via INTRAVENOUS

## 2013-02-01 MED ORDER — ROPINIROLE HCL 1 MG PO TABS
2.0000 mg | ORAL_TABLET | Freq: Every day | ORAL | Status: DC
  Administered 2013-02-01 – 2013-02-06 (×6): 2 mg via ORAL
  Filled 2013-02-01 (×7): qty 2

## 2013-02-01 MED ORDER — HYDROMORPHONE HCL PF 1 MG/ML IJ SOLN
INTRAMUSCULAR | Status: AC
  Filled 2013-02-01: qty 2

## 2013-02-01 MED ORDER — LIDOCAINE HCL (CARDIAC) 20 MG/ML IV SOLN
INTRAVENOUS | Status: DC | PRN
  Administered 2013-02-01: 40 mg via INTRAVENOUS

## 2013-02-01 MED ORDER — ROCURONIUM BROMIDE 100 MG/10ML IV SOLN
INTRAVENOUS | Status: DC | PRN
  Administered 2013-02-01: 50 mg via INTRAVENOUS

## 2013-02-01 MED ORDER — FLEET ENEMA 7-19 GM/118ML RE ENEM: 1.0000 | ENEMA | Freq: Once | RECTAL | Status: AC | PRN

## 2013-02-01 MED ORDER — HYDROMORPHONE HCL PF 1 MG/ML IJ SOLN
0.2500 mg | INTRAMUSCULAR | Status: AC | PRN
  Administered 2013-02-01 (×8): 0.5 mg via INTRAVENOUS

## 2013-02-01 MED ORDER — DEXTROSE 5 % IV SOLN
500.0000 mg | Freq: Four times a day (QID) | INTRAVENOUS | Status: DC | PRN
  Filled 2013-02-01: qty 5

## 2013-02-01 MED ORDER — PROPOFOL 10 MG/ML IV BOLUS
INTRAVENOUS | Status: DC | PRN
  Administered 2013-02-01: 160 mg via INTRAVENOUS

## 2013-02-01 MED ORDER — MENTHOL 3 MG MT LOZG: 1.0000 | LOZENGE | OROMUCOSAL | Status: DC | PRN

## 2013-02-01 MED ORDER — CEFAZOLIN SODIUM-DEXTROSE 2-3 GM-% IV SOLR
2.0000 g | INTRAVENOUS | Status: AC
  Administered 2013-02-01: 2 g via INTRAVENOUS

## 2013-02-01 MED ORDER — MIDAZOLAM HCL 2 MG/2ML IJ SOLN
INTRAMUSCULAR | Status: AC
  Filled 2013-02-01: qty 2

## 2013-02-01 MED ORDER — PHENOL 1.4 % MT LIQD: 1.0000 | OROMUCOSAL | Status: DC | PRN

## 2013-02-01 MED ORDER — SODIUM CHLORIDE 0.9 % IJ SOLN: 3.0000 mL | INTRAMUSCULAR | Status: DC | PRN

## 2013-02-01 MED ORDER — VITAMIN D3 25 MCG (1000 UNIT) PO TABS
1000.0000 [IU] | ORAL_TABLET | Freq: Every day | ORAL | Status: DC
  Administered 2013-02-01 – 2013-02-07 (×7): 1000 [IU] via ORAL
  Filled 2013-02-01 (×7): qty 1

## 2013-02-01 MED ORDER — INSULIN ASPART 100 UNIT/ML ~~LOC~~ SOLN
SUBCUTANEOUS | Status: AC
  Filled 2013-02-01: qty 1

## 2013-02-01 MED ORDER — FENTANYL CITRATE 0.05 MG/ML IJ SOLN
INTRAMUSCULAR | Status: DC | PRN
  Administered 2013-02-01: 50 ug via INTRAVENOUS
  Administered 2013-02-01: 25 ug via INTRAVENOUS
  Administered 2013-02-01: 100 ug via INTRAVENOUS
  Administered 2013-02-01: 50 ug via INTRAVENOUS
  Administered 2013-02-01: 150 ug via INTRAVENOUS

## 2013-02-01 MED ORDER — METHOCARBAMOL 100 MG/ML IJ SOLN
500.0000 mg | INTRAVENOUS | Status: AC
  Administered 2013-02-01: 500 mg via INTRAVENOUS
  Filled 2013-02-01: qty 5

## 2013-02-01 MED ORDER — POTASSIUM CHLORIDE CRYS ER 20 MEQ PO TBCR
20.0000 meq | EXTENDED_RELEASE_TABLET | Freq: Every day | ORAL | Status: DC
  Administered 2013-02-01 – 2013-02-07 (×7): 20 meq via ORAL
  Filled 2013-02-01 (×7): qty 1

## 2013-02-01 MED ORDER — POTASSIUM CHLORIDE IN NACL 20-0.45 MEQ/L-% IV SOLN
INTRAVENOUS | Status: DC
  Administered 2013-02-02 – 2013-02-03 (×3): via INTRAVENOUS
  Administered 2013-02-04: 1000 mL via INTRAVENOUS
  Administered 2013-02-04: 13:00:00 via INTRAVENOUS
  Filled 2013-02-01 (×16): qty 1000

## 2013-02-01 MED ORDER — NEOSTIGMINE METHYLSULFATE 1 MG/ML IJ SOLN
INTRAMUSCULAR | Status: DC | PRN
  Administered 2013-02-01: 5 mg via INTRAVENOUS

## 2013-02-01 MED ORDER — DULOXETINE HCL 60 MG PO CPEP
60.0000 mg | ORAL_CAPSULE | Freq: Every day | ORAL | Status: DC
  Administered 2013-02-02 – 2013-02-07 (×6): 60 mg via ORAL
  Filled 2013-02-01 (×7): qty 1

## 2013-02-01 MED ORDER — INSULIN ASPART 100 UNIT/ML ~~LOC~~ SOLN
0.0000 [IU] | Freq: Three times a day (TID) | SUBCUTANEOUS | Status: DC
  Administered 2013-02-01: 8 [IU] via SUBCUTANEOUS
  Administered 2013-02-02: 5 [IU] via SUBCUTANEOUS
  Administered 2013-02-02 – 2013-02-03 (×2): 3 [IU] via SUBCUTANEOUS
  Administered 2013-02-03 – 2013-02-04 (×3): 5 [IU] via SUBCUTANEOUS
  Administered 2013-02-04: 3 [IU] via SUBCUTANEOUS
  Administered 2013-02-04: 5 [IU] via SUBCUTANEOUS
  Administered 2013-02-05 (×2): 3 [IU] via SUBCUTANEOUS
  Administered 2013-02-05: 8 [IU] via SUBCUTANEOUS
  Administered 2013-02-06: 3 [IU] via SUBCUTANEOUS
  Administered 2013-02-06: 5 [IU] via SUBCUTANEOUS
  Administered 2013-02-06: 2 [IU] via SUBCUTANEOUS
  Administered 2013-02-07: 3 [IU] via SUBCUTANEOUS

## 2013-02-01 MED ORDER — ONDANSETRON HCL 4 MG/2ML IJ SOLN
4.0000 mg | INTRAMUSCULAR | Status: DC | PRN
  Administered 2013-02-01 – 2013-02-04 (×10): 4 mg via INTRAVENOUS
  Filled 2013-02-01 (×10): qty 2

## 2013-02-01 MED ORDER — OXYCODONE HCL 5 MG/5ML PO SOLN: 5.0000 mg | Freq: Once | ORAL | Status: DC | PRN

## 2013-02-01 MED ORDER — BISACODYL 10 MG RE SUPP: 10.0000 mg | Freq: Every day | RECTAL | Status: DC | PRN

## 2013-02-01 MED ORDER — CEFAZOLIN SODIUM-DEXTROSE 2-3 GM-% IV SOLR
INTRAVENOUS | Status: AC
  Filled 2013-02-01: qty 50

## 2013-02-01 MED ORDER — ONDANSETRON HCL 4 MG/2ML IJ SOLN
INTRAMUSCULAR | Status: DC | PRN
  Administered 2013-02-01: 4 mg via INTRAVENOUS

## 2013-02-01 MED ORDER — ARTIFICIAL TEARS OP OINT
TOPICAL_OINTMENT | OPHTHALMIC | Status: DC | PRN
  Administered 2013-02-01: 1 via OPHTHALMIC

## 2013-02-01 MED ORDER — 0.9 % SODIUM CHLORIDE (POUR BTL) OPTIME
TOPICAL | Status: DC | PRN
  Administered 2013-02-01: 1000 mL

## 2013-02-01 MED ORDER — ATORVASTATIN CALCIUM 40 MG PO TABS
40.0000 mg | ORAL_TABLET | Freq: Every day | ORAL | Status: DC
  Administered 2013-02-01 – 2013-02-06 (×6): 40 mg via ORAL
  Filled 2013-02-01 (×7): qty 1

## 2013-02-01 MED ORDER — PANTOPRAZOLE SODIUM 40 MG IV SOLR: 40.0000 mg | Freq: Every day | INTRAVENOUS | Status: DC

## 2013-02-01 MED ORDER — TRAMADOL HCL 50 MG PO TABS
50.0000 mg | ORAL_TABLET | Freq: Two times a day (BID) | ORAL | Status: DC | PRN
  Administered 2013-02-03: 100 mg via ORAL
  Filled 2013-02-01: qty 2

## 2013-02-01 MED ORDER — THROMBIN 20000 UNITS EX SOLR
CUTANEOUS | Status: DC | PRN
  Administered 2013-02-01: 11:00:00 via TOPICAL

## 2013-02-01 MED ORDER — THROMBIN 20000 UNITS EX SOLR
CUTANEOUS | Status: AC
  Filled 2013-02-01: qty 20000

## 2013-02-01 MED ORDER — PANTOPRAZOLE SODIUM 20 MG PO TBEC
20.0000 mg | DELAYED_RELEASE_TABLET | Freq: Every day | ORAL | Status: DC
  Administered 2013-02-01 – 2013-02-07 (×7): 20 mg via ORAL
  Filled 2013-02-01 (×7): qty 1

## 2013-02-01 MED ORDER — BUPROPION HCL ER (SR) 150 MG PO TB12
150.0000 mg | ORAL_TABLET | Freq: Two times a day (BID) | ORAL | Status: DC
  Administered 2013-02-01 – 2013-02-07 (×12): 150 mg via ORAL
  Filled 2013-02-01 (×13): qty 1

## 2013-02-01 MED ORDER — INSULIN GLARGINE 100 UNIT/ML ~~LOC~~ SOLN
40.0000 [IU] | Freq: Every day | SUBCUTANEOUS | Status: DC
  Administered 2013-02-01 – 2013-02-06 (×6): 40 [IU] via SUBCUTANEOUS
  Filled 2013-02-01 (×7): qty 0.4

## 2013-02-01 MED ORDER — FUROSEMIDE 40 MG PO TABS
40.0000 mg | ORAL_TABLET | Freq: Two times a day (BID) | ORAL | Status: DC
  Administered 2013-02-02 – 2013-02-07 (×11): 40 mg via ORAL
  Filled 2013-02-01 (×13): qty 1

## 2013-02-01 MED ORDER — SENNOSIDES-DOCUSATE SODIUM 8.6-50 MG PO TABS: 1.0000 | ORAL_TABLET | Freq: Every evening | ORAL | Status: DC | PRN

## 2013-02-01 MED ORDER — VECURONIUM BROMIDE 10 MG IV SOLR
INTRAVENOUS | Status: DC | PRN
  Administered 2013-02-01: 1 mg via INTRAVENOUS
  Administered 2013-02-01 (×3): 2 mg via INTRAVENOUS

## 2013-02-01 MED ORDER — CEFAZOLIN SODIUM 1-5 GM-% IV SOLN
1.0000 g | Freq: Three times a day (TID) | INTRAVENOUS | Status: AC
  Administered 2013-02-01 – 2013-02-02 (×2): 1 g via INTRAVENOUS
  Filled 2013-02-01 (×2): qty 50

## 2013-02-01 MED ORDER — PREGABALIN 50 MG PO CAPS
75.0000 mg | ORAL_CAPSULE | Freq: Two times a day (BID) | ORAL | Status: DC
  Administered 2013-02-01 – 2013-02-07 (×12): 75 mg via ORAL
  Filled 2013-02-01 (×12): qty 1

## 2013-02-01 MED ORDER — KETOROLAC TROMETHAMINE 30 MG/ML IJ SOLN
30.0000 mg | Freq: Four times a day (QID) | INTRAMUSCULAR | Status: AC | PRN
  Administered 2013-02-01 – 2013-02-03 (×2): 30 mg via INTRAVENOUS
  Filled 2013-02-01: qty 1

## 2013-02-01 MED ORDER — LISINOPRIL 20 MG PO TABS
20.0000 mg | ORAL_TABLET | Freq: Every day | ORAL | Status: DC
  Administered 2013-02-02 – 2013-02-07 (×5): 20 mg via ORAL
  Filled 2013-02-01 (×7): qty 1

## 2013-02-01 MED ORDER — OXYCODONE HCL 5 MG PO TABS: 5.0000 mg | ORAL_TABLET | Freq: Once | ORAL | Status: DC | PRN

## 2013-02-01 MED ORDER — ZOLPIDEM TARTRATE 5 MG PO TABS: 5.0000 mg | ORAL_TABLET | Freq: Every evening | ORAL | Status: DC | PRN

## 2013-02-01 MED ORDER — TRAZODONE HCL 50 MG PO TABS
50.0000 mg | ORAL_TABLET | Freq: Every evening | ORAL | Status: DC | PRN
  Administered 2013-02-03: 100 mg via ORAL
  Filled 2013-02-01: qty 2

## 2013-02-01 MED ORDER — GLYCOPYRROLATE 0.2 MG/ML IJ SOLN
INTRAMUSCULAR | Status: DC | PRN
  Administered 2013-02-01: .8 mg via INTRAVENOUS

## 2013-02-01 MED ORDER — SODIUM CHLORIDE 0.9 % IJ SOLN
3.0000 mL | Freq: Two times a day (BID) | INTRAMUSCULAR | Status: DC
  Administered 2013-02-04 – 2013-02-06 (×4): 3 mL via INTRAVENOUS

## 2013-02-01 SURGICAL SUPPLY — 76 items
ADH SKN CLS APL DERMABOND .7 (GAUZE/BANDAGES/DRESSINGS) ×1
BANDAGE ELASTIC 6 VELCRO ST LF (GAUZE/BANDAGES/DRESSINGS) IMPLANT
BLADE SURG 10 STRL SS (BLADE) ×2 IMPLANT
BLADE SURG ROTATE 9660 (MISCELLANEOUS) IMPLANT
BUR RND DIAMOND ELITE 4.0 (BURR) ×2 IMPLANT
BUR ROUND FLUTED 4 SOFT TCH (BURR) IMPLANT
CLOTH BEACON ORANGE TIMEOUT ST (SAFETY) ×2 IMPLANT
CORDS BIPOLAR (ELECTRODE) ×2 IMPLANT
COVER SURGICAL LIGHT HANDLE (MISCELLANEOUS) ×2 IMPLANT
DERMABOND ADVANCED (GAUZE/BANDAGES/DRESSINGS) ×1
DERMABOND ADVANCED .7 DNX12 (GAUZE/BANDAGES/DRESSINGS) ×1 IMPLANT
DRAPE C-ARM 42X72 X-RAY (DRAPES) ×2 IMPLANT
DRAPE MICROSCOPE LEICA (MISCELLANEOUS) ×2 IMPLANT
DRAPE ORTHO SPLIT 77X108 STRL (DRAPES) ×2
DRAPE PROXIMA HALF (DRAPES) ×8 IMPLANT
DRAPE SURG 17X23 STRL (DRAPES) ×6 IMPLANT
DRAPE SURG ORHT 6 SPLT 77X108 (DRAPES) ×1 IMPLANT
DRAPE TABLE COVER HEAVY DUTY (DRAPES) ×2 IMPLANT
DRSG EMULSION OIL 3X3 NADH (GAUZE/BANDAGES/DRESSINGS) ×2 IMPLANT
DRSG MEPILEX BORDER 4X4 (GAUZE/BANDAGES/DRESSINGS) ×2 IMPLANT
DRSG MEPILEX BORDER 4X8 (GAUZE/BANDAGES/DRESSINGS) ×2 IMPLANT
DRSG PAD ABDOMINAL 8X10 ST (GAUZE/BANDAGES/DRESSINGS) ×4 IMPLANT
DURAPREP 26ML APPLICATOR (WOUND CARE) ×2 IMPLANT
ELECT BLADE 4.0 EZ CLEAN MEGAD (MISCELLANEOUS) ×2
ELECT CAUTERY BLADE 6.4 (BLADE) ×2 IMPLANT
ELECT REM PT RETURN 9FT ADLT (ELECTROSURGICAL) ×2
ELECTRODE BLDE 4.0 EZ CLN MEGD (MISCELLANEOUS) ×1 IMPLANT
ELECTRODE REM PT RTRN 9FT ADLT (ELECTROSURGICAL) ×1 IMPLANT
EVACUATOR 1/8 PVC DRAIN (DRAIN) ×2 IMPLANT
Expedium 5.5 7x30 screw (Screw) ×2 IMPLANT
Expedium 5.5 rod 55mm (Rod) ×2 IMPLANT
GLOVE BIOGEL PI IND STRL 7.5 (GLOVE) ×1 IMPLANT
GLOVE BIOGEL PI IND STRL 8 (GLOVE) ×1 IMPLANT
GLOVE BIOGEL PI INDICATOR 7.5 (GLOVE) ×1
GLOVE BIOGEL PI INDICATOR 8 (GLOVE) ×1
GLOVE ECLIPSE 7.0 STRL STRAW (GLOVE) ×2 IMPLANT
GLOVE ORTHO TXT STRL SZ7.5 (GLOVE) ×2 IMPLANT
GOWN PREVENTION PLUS LG XLONG (DISPOSABLE) ×1 IMPLANT
GOWN STRL NON-REIN LRG LVL3 (GOWN DISPOSABLE) ×6 IMPLANT
HEMOSTAT SURGICEL 2X14 (HEMOSTASIS) IMPLANT
KIT BASIN OR (CUSTOM PROCEDURE TRAY) ×2 IMPLANT
KIT ROOM TURNOVER OR (KITS) ×2 IMPLANT
MANIFOLD NEPTUNE II (INSTRUMENTS) ×2 IMPLANT
NDL SPNL 18GX3.5 QUINCKE PK (NEEDLE) ×3 IMPLANT
NDL SUT .5 MAYO 1.404X.05X (NEEDLE) ×1 IMPLANT
NEEDLE MAYO TAPER (NEEDLE) ×2
NEEDLE SPNL 18GX3.5 QUINCKE PK (NEEDLE) ×6 IMPLANT
NS IRRIG 1000ML POUR BTL (IV SOLUTION) ×2 IMPLANT
PACK LAMINECTOMY ORTHO (CUSTOM PROCEDURE TRAY) ×2 IMPLANT
PAD ARMBOARD 7.5X6 YLW CONV (MISCELLANEOUS) ×4 IMPLANT
PATTIES SURGICAL .5 X.5 (GAUZE/BANDAGES/DRESSINGS) ×4 IMPLANT
PATTIES SURGICAL .75X.75 (GAUZE/BANDAGES/DRESSINGS) IMPLANT
SCREW EXPEDIUM POLYAXIAL 6X40 (Screw) ×6 IMPLANT
SCREW EXPEDIUM POLYAXIAL 6X45M (Screw) ×2 IMPLANT
SCREW SET SINGLE INNER (Screw) ×6 IMPLANT
SPACER CONCORDE CUR 5DEG L8MM (Orthopedic Implant) ×1 IMPLANT
SPACER CONCORDE CUR 5DEG L9MM (Orthopedic Implant) ×1 IMPLANT
SPONGE LAP 18X18 X RAY DECT (DISPOSABLE) IMPLANT
SPONGE LAP 4X18 X RAY DECT (DISPOSABLE) ×8 IMPLANT
SPONGE SURGIFOAM ABS GEL 100 (HEMOSTASIS) ×1 IMPLANT
STAPLER VISISTAT 35W (STAPLE) IMPLANT
SUT BONE WAX W31G (SUTURE) IMPLANT
SUT VIC AB 0 CT1 27 (SUTURE) ×4
SUT VIC AB 0 CT1 27XBRD ANBCTR (SUTURE) IMPLANT
SUT VIC AB 1 CT1 27 (SUTURE) ×2
SUT VIC AB 1 CT1 27XBRD ANBCTR (SUTURE) IMPLANT
SUT VIC AB 2-0 CT1 27 (SUTURE) ×2
SUT VIC AB 2-0 CT1 TAPERPNT 27 (SUTURE) ×1 IMPLANT
SUT VICRYL 0 TIES 12 18 (SUTURE) ×2 IMPLANT
SUT VICRYL 4-0 PS2 18IN ABS (SUTURE) ×1 IMPLANT
SUT VICRYL AB 2 0 TIES (SUTURE) ×2 IMPLANT
TOWEL OR 17X24 6PK STRL BLUE (TOWEL DISPOSABLE) ×2 IMPLANT
TOWEL OR 17X26 10 PK STRL BLUE (TOWEL DISPOSABLE) ×2 IMPLANT
TRAY FOLEY CATH 16FRSI W/METER (SET/KITS/TRAYS/PACK) ×2 IMPLANT
WATER STERILE IRR 1000ML POUR (IV SOLUTION) ×2 IMPLANT
YANKAUER SUCT BULB TIP NO VENT (SUCTIONS) ×2 IMPLANT

## 2013-02-01 NOTE — Brief Op Note (Cosign Needed)
02/01/2013  3:54 PM  PATIENT:  [name]  67 y.o. female  PRE-OPERATIVE DIAGNOSIS:  L4-5, L5-S1 Instability, L5 spondylolysis, spondylolisthesis, stenosis  POST-OPERATIVE DIAGNOSIS:  L4-5, L5-S1 Instability, L5 spondylolysis, spondylolisthesis, stenosis  PROCEDURE:  Procedure(s) with comments: POSTERIOR LUMBAR FUSION 2 LEVEL (N/A) - L4-5, L5-S1 TLIF (transforaminal lumbar interbody fusion), pedicle instrumentation, PEEK cages  SURGEON:  Surgeon(s) and Role:    * Eldred Manges, MD - Primary  PHYSICIAN ASSISTANT: Maud Deed [facility]  ASSISTANTS: none   ANESTHESIA:   general  EBL:  Total I/O In: 2735 [I.V.:2300; Blood:435] Out: 1225 [Urine:325; Blood:900]  BLOOD ADMINISTERED:  DRAINS: none   LOCAL MEDICATIONS USED:  NONE  SPECIMEN:  No Specimen  DISPOSITION OF SPECIMEN:  N/A  COUNTS:  YES  TOURNIQUET:  * No tourniquets in log *  DICTATION: .Note written in EPIC  PLAN OF CARE: Admit to inpatient   PATIENT DISPOSITION:  PACU - hemodynamically stable.   Delay start of Pharmacological VTE agent (>24hrs) due to surgical blood loss or risk of bleeding: yes

## 2013-02-01 NOTE — Preoperative (Addendum)
Beta Blockers   Reason not to administer Beta Blockers:Not Applicable 

## 2013-02-01 NOTE — Progress Notes (Signed)
Nurse called into OR room 5 and informed Krista Wise to let Dr. Ophelia Charter know that patient did not have any orders in EPIC.

## 2013-02-01 NOTE — Anesthesia Postprocedure Evaluation (Signed)
  Anesthesia Post-op Note  Patient: [name]  Procedure(s) Performed: Procedure(s) with comments: POSTERIOR LUMBAR FUSION 2 LEVEL (N/A) - L4-5, L5-S1 TLIF (transforaminal lumbar interbody fusion), pedicle instrumentation, PEEK cages  Patient Location: PACU  Anesthesia Type:General  Level of Consciousness: awake, alert  and oriented  Airway and Oxygen Therapy: Patient Spontanous Breathing and Patient connected to nasal cannula oxygen  Post-op Pain: mild  Post-op Assessment: Post-op Vital signs reviewed, Patient's Cardiovascular Status Stable, Respiratory Function Stable, Patent Airway and Pain level controlled  Post-op Vital Signs: stable  Complications: No apparent anesthesia complications

## 2013-02-01 NOTE — Progress Notes (Signed)
Patient remains weeping and despondent, but states she has no pain.  She feels like she cannot get her breath, 02 saturation is 100% on 2L by [place].  Pt appears distressed, in spite of her statement that she is not in pain.  Dr. Noreene Larsson paged, en route to bedside

## 2013-02-01 NOTE — Transfer of Care (Signed)
Immediate Anesthesia Transfer of Care Note  Patient: Krista Wise  Procedure(s) Performed: Procedure(s) with comments: POSTERIOR LUMBAR FUSION 2 LEVEL (N/A) - L4-5, L5-S1 TLIF (transforaminal lumbar interbody fusion), pedicle instrumentation, PEEK cages  Patient Location: PACU  Anesthesia Type:General  Level of Consciousness: lethargic and responds to stimulation  Airway & Oxygen Therapy: Patient Spontanous Breathing and Patient connected to nasal cannula oxygen  Post-op Assessment: Report given to PACU RN and Patient moving all extremities X 4  Post vital signs: Reviewed and stable  Complications: No apparent anesthesia complications

## 2013-02-01 NOTE — Progress Notes (Signed)
Physical medicine and rehabilitation consult requested. Patient presently in surgery and will need formal physical occupational therapy evaluations to help establish discharge needs. Will followup after all disciplines completed

## 2013-02-01 NOTE — Interval H&P Note (Signed)
History and Physical Interval Note:  02/01/2013 10:55 AM  Krista Wise  has presented today for surgery, with the diagnosis of L4-5, L5-S1 Instability, L5 spondylolysis, spondylolisthesis, stenosis  The various methods of treatment have been discussed with the patient and family. After consideration of risks, benefits and other options for treatment, the patient has consented to  Procedure(s) with comments: POSTERIOR LUMBAR FUSION 2 LEVEL (N/A) - L4-5, L5-S1 TLIF (transforaminal lumbar interbody fusion), pedicle instrumentation, PEEK cages as a surgical intervention .  The patient's history has been reviewed, patient examined, no change in status, stable for surgery.  I have reviewed the patient's chart and labs.  Questions were answered to the patient's satisfaction.     Lonna Rabold C

## 2013-02-01 NOTE — Anesthesia Procedure Notes (Signed)
Procedure Name: Intubation Date/Time: 02/01/2013 11:01 AM Performed by: Jefm Miles E Pre-anesthesia Checklist: Patient identified, Emergency Drugs available, Suction available, Patient being monitored and Timeout performed Patient Re-evaluated:Patient Re-evaluated prior to inductionOxygen Delivery Method: Circle system utilized Preoxygenation: Pre-oxygenation with 100% oxygen Intubation Type: IV induction Ventilation: Mask ventilation without difficulty Laryngoscope Size: Mac and 3 Grade View: Grade I Tube type: Oral Tube size: 7.5 mm Number of attempts: 1 Airway Equipment and Method: Stylet Placement Confirmation: ETT inserted through vocal cords under direct vision,  positive ETCO2 and breath sounds checked- equal and bilateral Secured at: 21 cm Tube secured with: Tape Dental Injury: Teeth and Oropharynx as per pre-operative assessment and Injury to lip

## 2013-02-01 NOTE — Anesthesia Preprocedure Evaluation (Addendum)
Anesthesia Evaluation  Patient identified by MRN, date of birth, ID band Patient awake    Reviewed: Allergy & Precautions, H&P , NPO status , Patient's Chart, lab work & pertinent test results, reviewed documented beta blocker date and time   Airway Mallampati: III TM Distance: >3 FB Neck ROM: Full    Dental no notable dental hx. (+) Teeth Intact and Dental Advisory Given,    Pulmonary neg pulmonary ROS, shortness of breath and with exertion, sleep apnea ,  breath sounds clear to auscultation  Pulmonary exam normal       Cardiovascular hypertension, On Medications and On Home Beta Blockers + angina at rest + CAD, + Cardiac Stents and +CHF + Valvular Problems/Murmurs AS Rhythm:Regular Rate:Normal + Systolic murmurs    Neuro/Psych  Headaches, PSYCHIATRIC DISORDERS    GI/Hepatic Neg liver ROS, GERD-  Controlled,  Endo/Other  diabetes, Type 1, Insulin DependentMorbid obesity  Renal/GU negative Renal ROS  negative genitourinary   Musculoskeletal   Abdominal   Peds  Hematology negative hematology ROS (+)   Anesthesia Other Findings   Reproductive/Obstetrics negative OB ROS                          Anesthesia Physical Anesthesia Plan  ASA: IV  Anesthesia Plan: General   Post-op Pain Management:    Induction: Intravenous  Airway Management Planned: Oral ETT  Additional Equipment: Arterial line  Intra-op Plan:   Post-operative Plan: Extubation in OR and Possible Post-op intubation/ventilation  Informed Consent: I have reviewed the patients History and Physical, chart, labs and discussed the procedure including the risks, benefits and alternatives for the proposed anesthesia with the patient or authorized representative who has indicated his/her understanding and acceptance.   Dental advisory given  Plan Discussed with: CRNA  Anesthesia Plan Comments:         Anesthesia Quick  Evaluation

## 2013-02-02 HISTORY — PX: LUMBAR FUSION: SHX111

## 2013-02-02 LAB — BASIC METABOLIC PANEL
CO2: 29 mEq/L (ref 19–32)
Calcium: 7.9 mg/dL — ABNORMAL LOW (ref 8.4–10.5)
Creatinine, Ser: 1.27 mg/dL — ABNORMAL HIGH (ref 0.50–1.10)
GFR calc non Af Amer: 43 mL/min — ABNORMAL LOW (ref >90)
Glucose, Bld: 223 mg/dL — ABNORMAL HIGH (ref 70–99)
Sodium: 137 mEq/L (ref 135–145)

## 2013-02-02 LAB — CBC
Hemoglobin: 8.8 g/dL — ABNORMAL LOW (ref 12.0–15.0)
MCH: 26.4 pg (ref 26.0–34.0)
MCHC: 31.4 g/dL (ref 30.0–36.0)
MCV: 84.1 fL (ref 78.0–100.0)
Platelets: 220 10*3/uL (ref 150–400)
RBC: 3.33 MIL/uL — ABNORMAL LOW (ref 3.87–5.11)

## 2013-02-02 LAB — GLUCOSE, CAPILLARY: Glucose-Capillary: 197 mg/dL — ABNORMAL HIGH (ref 70–99)

## 2013-02-02 NOTE — Progress Notes (Signed)
PT Cancellation Note  Patient Details Name: Krista Wise MRN: [id number] DOB: 10/10/1945   Cancelled Treatment:    Reason Eval/Treat Not Completed: Medical issues which prohibited therapy (N&V).  Eval attempted in AM and PM.  Pt with nausea and vomitting, unable to participate.  PT to re-attempt tomorrow AM.   Ilda Foil 02/02/2013, 2:17 PM  Aida Raider, PT  Office # [phone] Pager [phone]

## 2013-02-02 NOTE — Progress Notes (Signed)
OT Cancellation Note  Patient Details Name: Krista Wise MRN: [id number] DOB: 1946-07-17   Cancelled Treatment:    Reason Eval/Treat Not Completed: Other (comment) (brace not in room). RN made aware and reported she would call ortho tech about need for brace.  Will check back later today as time allows.  02/02/2013 [name] OTR/L Pager [phone] Office [phone]

## 2013-02-02 NOTE — Progress Notes (Signed)
Subjective: 1 Day Post-Op Procedure(s) (LRB): POSTERIOR LUMBAR FUSION 2 LEVEL (N/A) Patient reports pain as 3 on 0-10 scale.    Objective: Vital signs in last 24 hours: Temp:  [97.6 F (36.4 C)-99.5 F (37.5 C)] 99.5 F (37.5 C) (07/12 0549) Pulse Rate:  [59-81] 59 (07/12 1008) Resp:  [7-24] 18 (07/12 0549) BP: (90-129)/(33-75) 101/47 mmHg (07/12 1008) SpO2:  [91 %-100 %] 96 % (07/12 0549)  Intake/Output from previous day: 07/11 0701 - 07/12 0700 In: 4245 [I.V.:3710; Blood:435; IV Piggyback:100] Out: 1625 [Urine:725; Blood:900] Intake/Output this shift: Total I/O In: 360 [P.O.:360] Out: 150 [Urine:150]   Recent Labs  02/02/13 0435  HGB 8.8*    Recent Labs  02/02/13 0435  WBC 9.7  RBC 3.33*  HCT 28.0*  PLT 220    Recent Labs  02/02/13 0435  NA 137  K 4.4  CL 99  CO2 29  BUN 19  CREATININE 1.27*  GLUCOSE 223*  CALCIUM 7.9*   No results found for this basename: LABPT, INR,  in the last 72 hours  Neurologically intact  Has right EHL slight weakness, AT is strong right and left. GS normal  Assessment/Plan: 1 Day Post-Op Procedure(s) (LRB): POSTERIOR LUMBAR FUSION 2 LEVEL (N/A) Up with therapy brace coming today.   Tran Randle C 02/02/2013, 11:28 AM

## 2013-02-02 NOTE — Progress Notes (Signed)
OT Cancellation Note  Patient Details Name: Krista Wise MRN: [id number] DOB: 09-Feb-1946   Cancelled Treatment:    Reason Eval/Treat Not Completed: Other (comment) (pt experiencing n/v). Attempted in AM and PM. Pt too limited by nausea/vomiting to participate in session. Will re-attempt tomorrow.  02/02/2013 [name] OTR/L Pager [phone] Office [phone]

## 2013-02-02 NOTE — Op Note (Signed)
NAMEESBEYDI, MANAGO               ACCOUNT NO.:  [id number]  MEDICAL RECORD NO.:  [id number]  LOCATION:  5N21C                        FACILITY:  [facility]  PHYSICIAN:  Hollan Philipp C. Ophelia Charter, M.D.    DATE OF BIRTH:  [birth year]  DATE OF PROCEDURE:  02/01/2013 DATE OF DISCHARGE:                              OPERATIVE REPORT   PREOPERATIVE DIAGNOSIS:  Spondylolysis, spondylolisthesis with stenosis and instability, L4-5, L5-S1.  POSTOPERATIVE DIAGNOSIS:  Spondylolysis, spondylolisthesis with stenosis and instability, L4-5, L5-S1.  PROCEDURE:  Two-level Gill procedure, decompression, right transforaminal interbody fusion with Concorde curved cages, 8 mm at L4- 5, 9 mm at L5-S1.  Expedium pedicle screw fixation, 6 x 45 mm screws at L4, 6 x 40 at L5, and 7 x 30 at S1.  Bilateral lateral inner transverse process fusion (360-degree fusion), 55-mm titanium rods.  SURGEON:  San Lohmeyer C. Ophelia Charter, MD  ASSISTANT:  Wende Neighbors, PA-C medically necessary and present for the entire procedure.  ANESTHESIA:  GOT.  ESTIMATED BLOOD LOSS:  900 mL re-transfusion, 435 mL auto-transfusion.  DRAINS:  None.  COMPLICATIONS:  None.  BRIEF HISTORY:  This s53 year old female who has had progressive neurogenic claudication symptoms, has grade 2 spondylolisthesis, instability with shifting, greater than 5 mm,  __________ disk space and also __________ collapse and spondylolisthesis, grade 1-1/2 at the L4-5 level with instability and disk bulge and stenosis.  DESCRIPTION OF PROCEDURE:  After induction of general anesthesia, orotracheal intubation, Ancef prophylaxis 2 g.  The patient was placed prone on chest rolls.  After intubation and Foley catheter placement, back was prepped with DuraPrep.  Arms were at 90/90.  Heel pads underneath the shoulders, pads on the ulnar nerve and upper arm.  The areas of the back were squared with 10:10 drapes, DuraPrep, usual 4 towels, Betadine, Steri-Drape, and laminectomy sheet and  drape.  Time-out procedure was completed.  Midline incision was made after needle localization with 2 spinal needles at the expected level and then incision was made appropriately.  Subperiosteal dissection out on the lamina was performed and then confirmation again with C-arm, decompression at L4-5, L5-S1 level with complete laminectomies, Gill procedure, removal of unstable posterior elements with bilateral pars defects at L5 with instability.  At L4-5 level, also had instability and spondylolisthesis, but had intact pars at the L4 level with degenerative spondylolisthesis.  Thick chunks of ligament were removed, decompressing the dura.  Dura was carefully protected with padding.  Operative microscope was brought in, was used for nerve root protection and diskectomy.  L4-5 level was surgically approached first with diskectomy, progressing using all curettes, rasps, scrapers.  Once the endplates were rasped, trial sizers were progressed and an 8-mm cage placed at L4- 5 level from the right side.  It was checked under fluoroscopy.  There was good restoration of disk space height and significant improvement and spondylolisthesis that was present prior to decompression and fusion.  At L5-S1 level, there was considerable sclerotic changes in the pars defect with L5 foraminal compression.  This was decompressed.  She was more symptomatic on the right.  Cage was placed on the right side. There was a significant slip with placement of progressively larger cages with  anterior and longitudinal ligament still intact, gradually improvement in the alignment and partial reduction of the spondylolisthesis was obtained.  A 9-mm cage was selected.  This again was Concorde curved cage.  It was kicked over after bone was meticulously packed as it was at L4-5 level anterior to the cage position after rasping, scraping, curetting and using a re-instrument and set for maximum preparation of the endplates.   Bone was packed in using Russian forceps, straight bone impactor, curved bone impactor using a 7-mm trial to impact and then repeating the process until the disk space anteriorly was packed tightly with so much bone that the cage would barely fit in.  Cage was kicked over, checked under fluoroscopy. Next, screws were placed as size as listed above, DePuy spine Expedium. __________ checked under fluoroscopy.  The awl hand were tapped slightly and then progressed with hand down the pedicle.  Checking under fluoroscopy, feeling with a ball-tip pedicle probe, using hockey stick to feel the pedicle inside the canal to make sure there was no penetration, tapping, checking under fluoroscopy, repeating the ball tip probe, repeating the hockey stick usage.  Decortication laterally with the transverse process and then placement of the screw.  This was repeated for all levels.  Once screws were in, rod was bent a little bit extra due to the lordosis and 55-mm rods were selected, placed, caps were tightened on the TLIF right side first, compressing and tightening down the rod caps to 80 pounds until they clicked twice.  Identical procedures was repeated, tightening down on the left side next.  There was good stability.  Copious irrigation, bone was then packed out over the decorticated transverse process on lateral gutter, splitting up, half on each side.  Spinal spot fluoroscopic pictures were taken, showing good position and alignment.  Copious irrigation and standard layered closure with #1 or 0 Vicryl in the deep layer, 2-0 Vicryl in the subcutaneous tissue, subcuticular skin closure, postop dressing, and transferred to the recovery room.  The patient tolerated the procedure well.     Izzabella Besse C. Ophelia Charter, M.D.     MCY/MEDQ  D:  02/01/2013  T:  02/02/2013  Job:  [id number]

## 2013-02-02 NOTE — Progress Notes (Signed)
Orthopedic Tech Progress Note Patient Details:  Krista Wise February 17, 1946 [id number]  Patient ID: [name], female   DOB: 25-Jun-1946, 67 y.o.   MRN: [id number]   [name] 02/02/2013, 9:47 AM Called bio-tech for Lumbar fusion brace.

## 2013-02-03 LAB — GLUCOSE, CAPILLARY
Glucose-Capillary: 177 mg/dL — ABNORMAL HIGH (ref 70–99)
Glucose-Capillary: 205 mg/dL — ABNORMAL HIGH (ref 70–99)

## 2013-02-03 LAB — URINALYSIS, ROUTINE W REFLEX MICROSCOPIC
Bilirubin Urine: NEGATIVE
Hgb urine dipstick: NEGATIVE
Ketones, ur: NEGATIVE mg/dL
Nitrite: NEGATIVE
Specific Gravity, Urine: 1.018 (ref 1.005–1.030)
Urobilinogen, UA: 0.2 mg/dL (ref 0.0–1.0)

## 2013-02-03 MED ORDER — SODIUM CHLORIDE 0.9 % IV BOLUS (SEPSIS)
500.0000 mL | Freq: Once | INTRAVENOUS | Status: AC
  Administered 2013-02-03: 500 mL via INTRAVENOUS

## 2013-02-03 MED ORDER — BISACODYL 10 MG RE SUPP
10.0000 mg | Freq: Once | RECTAL | Status: AC
  Administered 2013-02-03: 10 mg via RECTAL
  Filled 2013-02-03: qty 1

## 2013-02-03 NOTE — Progress Notes (Signed)
Clinical Social Work Department CLINICAL SOCIAL WORK PLACEMENT NOTE 02/03/2013  Patient:  [name], Krista Wise  Account Number:  [id number] Admit date:  02/01/2013  Clinical Social Worker:  Orpah Greek  Date/time:  02/03/2013 03:31 PM  Clinical Social Work is seeking post-discharge placement for this patient at the following level of care:   SKILLED NURSING   (*CSW will update this form in Epic as items are completed)   02/03/2013  Patient/family provided with [facility] Department of Clinical Social Work's list of facilities offering this level of care within the geographic area requested by the patient (or if unable, by the patient's family).  02/03/2013  Patient/family informed of their freedom to choose among providers that offer the needed level of care, that participate in Medicare, Medicaid or managed care program needed by the patient, have an available bed and are willing to accept the patient.  02/03/2013  Patient/family informed of [facility]' ownership interest in [facility], as well as of the fact that they are under no obligation to receive care at this facility.  PASARR submitted to EDS on 02/03/2013 PASARR number received from [employer] on 02/03/2013  FL2 transmitted to all facilities in geographic area requested by pt/family on  02/03/2013 FL2 transmitted to all facilities within larger geographic area on   Patient informed that his/her managed care company has contracts with or will negotiate with  certain facilities, including the following:     Patient/family informed of bed offers received:   Patient chooses bed at  Physician recommends and patient chooses bed at    Patient to be transferred to  on   Patient to be transferred to facility by   The following physician request were entered in Epic:   Additional Comments:   Unice Bailey, LCSW  Clinical Social Worker  cell #: [phone]

## 2013-02-03 NOTE — Evaluation (Signed)
Occupational Therapy Evaluation Patient Details Name: Krista Wise MRN: [id number] DOB: 09-20-1945 Today's Date: 02/03/2013 Time: 4098-1191 OT Time Calculation (min): 16 min  OT Assessment / Plan / Recommendation History of present illness Pt with lengthy h/o back pain.  She underwent lumbar fusion 02-01-13.  Session limited by N&V, lethargy, and low BP.   Clinical Impression   Pt with poor activity tolerance at this time along with below problem list. Will continue to follow acutely. Recommending [facility] SNF for d/c planning but will update plan if needed pending pt progress.     OT Assessment  Patient needs continued OT Services    Follow Up Recommendations  SNF    Barriers to Discharge      Equipment Recommendations  None recommended by OT    Recommendations for Other Services    Frequency  Min 2X/week    Precautions / Restrictions Precautions Precautions: Back Precaution Booklet Issued: Yes (comment) Precaution Comments: Handout provided.  Pt educated on 3/3 back precautions. Required Braces or Orthoses: Spinal Brace Spinal Brace: Lumbar corset;Applied in sitting position   Pertinent Vitals/Pain See vitals    ADL  Eating/Feeding: Performed;Set up Where Assessed - Eating/Feeding: Chair Grooming: Performed;Wash/dry face;Minimal assistance Where Assessed - Grooming: Supported sitting Equipment Used: Back brace Transfers/Ambulation Related to ADLs: Pt began to experience n/v before attempting to stand.  Repositoned pt back in chair and let her rest. ADL Comments: Session very limited due to pt experiencing n/v as PT/OT began to attempt mobility.  Pt also already fatigued due to having recently been bathed by nursing staff and then mobilized to chair with nursing assist.    OT Diagnosis: Generalized weakness;Acute pain  OT Problem List: Decreased strength;Decreased activity tolerance;Decreased knowledge of use of DME or AE;Decreased knowledge of  precautions;Obesity;Pain;Impaired balance (sitting and/or standing) OT Treatment Interventions: Self-care/ADL training;DME and/or AE instruction;Therapeutic activities;Patient/family education;Balance training   OT Goals(Current goals can be found in the care plan section) Acute Rehab OT Goals Patient Stated Goal: to be able to walk OT Goal Formulation: With patient Time For Goal Achievement: 02/17/13 Potential to Achieve Goals: Good  Visit Information  Last OT Received On: 02/03/13 Assistance Needed: +2 PT/OT Co-Evaluation/Treatment: Yes History of Present Illness: Pt with lengthy h/o back pain.  She underwent lumbar fusion 02-01-13.  Completion of PT eval has been delayed by N&V, lethargy, and low BP.       Prior Functioning     Home Living Family/patient expects to be discharged to:: Private residence Living Arrangements: Spouse/significant other Available Help at Discharge: Family;Available 24 hours/day Type of Home: House Home Access: Ramped entrance Home Layout: Multi-level;Full bath on main level;Able to live on main level with bedroom/bathroom Home Equipment: Shower seat - built in;Walker - 2 wheels;Bedside commode;Grab bars - toilet Prior Function Level of Independence: Needs assistance Gait / Transfers Assistance Needed: Ambulatory short distances in the house with RW. ADL's / Homemaking Assistance Needed: Pt's husband provides varying levels of assist for ADLs/bathing depending on how pt feels. Communication Communication: No difficulties         Vision/Perception     Cognition  Cognition Arousal/Alertness: Lethargic Behavior During Therapy: WFL for tasks assessed/performed Overall Cognitive Status: Within Functional Limits for tasks assessed    Extremity/Trunk Assessment Upper Extremity Assessment Upper Extremity Assessment: Overall WFL for tasks assessed     Mobility Bed Mobility Bed Mobility: Not assessed Details for Bed Mobility Assistance: pt up  in chair     Exercise     Balance Balance  Balance Assessed: Yes Static Sitting Balance Static Sitting - Balance Support: Bilateral upper extremity supported;Feet supported Static Sitting - Level of Assistance: 4: Min assist Static Sitting - Comment/# of Minutes: Pt sitting EOC and requiring min assist to maintain balance due to posterior lean.  Pt very fatigued and experiencing n/v, therefore having difficult time keeping herself balance at [facility].   End of Session OT - End of Session Equipment Utilized During Treatment: Back brace Activity Tolerance: Patient limited by fatigue;Other (comment) (N/V) Patient left: in chair;with call bell/phone within reach Nurse Communication: Mobility status;Other (comment) (N/V)  GO    02/03/2013 [name] OTR/L Pager [phone] Office [phone]  [name] 02/03/2013, 11:22 AM

## 2013-02-03 NOTE — Progress Notes (Addendum)
Subjective: 2 Days Post-Op Procedure(s) (LRB): POSTERIOR LUMBAR FUSION 2 LEVEL (N/A) Patient reports pain as 3 on 0-10 scale.    Objective: Vital signs in last 24 hours: Temp:  [99.2 F (37.3 C)-99.9 F (37.7 C)] 99.2 F (37.3 C) (07/13 0947) Pulse Rate:  [74-77] 74 (07/13 0947) Resp:  [16-18] 16 (07/13 0947) BP: (85-113)/(32-54) 98/54 mmHg (07/13 0947) SpO2:  [93 %-99 %] 97 % (07/13 0947)  Intake/Output from previous day: 07/12 0701 - 07/13 0700 In: 2605 [P.O.:1440; I.V.:1165] Out: 400 [Urine:400] Intake/Output this shift: Total I/O In: -  Out: 775 [Urine:775]   Recent Labs  02/02/13 0435  HGB 8.8*    Recent Labs  02/02/13 0435  WBC 9.7  RBC 3.33*  HCT 28.0*  PLT 220    Recent Labs  02/02/13 0435  NA 137  K 4.4  CL 99  CO2 29  BUN 19  CREATININE 1.27*  GLUCOSE 223*  CALCIUM 7.9*   No results found for this basename: LABPT, INR,  in the last 72 hours  Neurologically intact  Assessment/Plan: 2 Days Post-Op Procedure(s) (LRB): POSTERIOR LUMBAR FUSION 2 LEVEL (N/A) Up with therapy   Has had to have foley reinserted, poor PO's . Post op urinary retention.  Check UA. Continue IV and Zofran for nausea  Ridge Lafond C 02/03/2013, 11:24 AM Having some increased vision left eye with "lines " in her vision sees Dr. Luciana Axe and has had laser surgery in past.

## 2013-02-03 NOTE — Progress Notes (Signed)
Clinical Social Work Department BRIEF PSYCHOSOCIAL ASSESSMENT 02/03/2013  Patient:  Krista Wise, Krista Wise     Account Number:  [id number]     Admit date:  02/01/2013  Clinical Social Worker:  Orpah Greek  Date/Time:  02/03/2013 03:25 PM  Referred by:  Physician  Date Referred:  02/03/2013 Referred for  SNF Placement   Other Referral:   Interview type:  Patient Other interview type:   and family at bedside    PSYCHOSOCIAL DATA Living Status:  HUSBAND Admitted from facility:   Level of care:   Primary support name:  Naileah Karg (husband) ph#: [phone] Primary support relationship to patient:  SPOUSE Degree of support available:   good    CURRENT CONCERNS Current Concerns  Post-Acute Placement   Other Concerns:    SOCIAL WORK ASSESSMENT / PLAN CSW received consult for SNF placement, note PT recommended SNF as well. CSW spoke with patient, husband & family at bedside re: discharge planning.   Assessment/plan status:  Information/Referral to [facility] Other assessment/ plan:   Information/referral to community resources:   CSW completed FL2 and faxed information out to [facility] - will follow-up with bed offers when available.    PATIENT'S/FAMILY'S RESPONSE TO PLAN OF CARE: Patient was hesitant to agree with plan for SNF but after discussing with her husband & children at bedside she is on board with plan for [facility] SNF as her sister-in-law is currently there for rehab as well.       Unice Bailey, LCSW Clinical Social Worker cell #: [phone]

## 2013-02-03 NOTE — Evaluation (Signed)
Physical Therapy Evaluation Patient Details Name: Krista Wise MRN: [id number] DOB: 02/02/1946 Today's Date: 02/03/2013 Time: 4098-1191 PT Time Calculation (min): 19 min  PT Assessment / Plan / Recommendation History of Present Illness  Pt with lengthy h/o back pain.  She underwent lumbar fusion 02-01-13.  Completion of PT eval has been delayed by N&V, lethargy, and low BP.  Clinical Impression  Patient is s/p lumbar surgery resulting in the deficits listed below (see PT Problem List). She was ambulating short distances with RW PTA.  Patient will benefit from skilled PT to increase their independence and safety with mobility (while adhering to their precautions) to allow discharge to next venue of care, SNF.  Pt progress may be slow due to medical complications hindering her ability to tolerate mobility.      PT Assessment  Patient needs continued PT services    Follow Up Recommendations  SNF;Supervision/Assistance - 24 hour    Does the patient have the potential to tolerate intense rehabilitation      Barriers to Discharge        Equipment Recommendations  None recommended by PT    Recommendations for Other Services     Frequency Min 5X/week    Precautions / Restrictions Precautions Precautions: Back Precaution Booklet Issued: Yes (comment) Precaution Comments: Handout provided.  Pt educated on 3/3 back precautions. Required Braces or Orthoses: Spinal Brace Spinal Brace: Lumbar corset;Applied in sitting position   Pertinent Vitals/Pain 5/10      Mobility  Bed Mobility Bed Mobility: Not assessed Details for Bed Mobility Assistance: Pt up in chair.  +2 assist from nursing. Transfers Transfers: Not assessed;Sit to Stand Sit to Stand: 1: +2 Total assist Details for Transfer Assistance: Attempted sit to stand from recliner.  Pt began vomitting and feeling poorly.  She needed to be returned to chair in reclined position.    Exercises     PT Diagnosis: Difficulty  walking;Generalized weakness;Acute pain  PT Problem List: Decreased strength;Decreased activity tolerance;Decreased balance;Decreased mobility;Decreased knowledge of precautions;Pain PT Treatment Interventions: DME instruction;Gait training;Functional mobility training;Therapeutic activities;Balance training;Patient/family education     PT Goals(Current goals can be found in the care plan section) Acute Rehab PT Goals Patient Stated Goal: to be able to walk PT Goal Formulation: With patient Time For Goal Achievement: 02/17/13 Potential to Achieve Goals: Good  Visit Information  Last PT Received On: 02/03/13 Assistance Needed: +2 PT/OT Co-Evaluation/Treatment: Yes History of Present Illness: Pt with lengthy h/o back pain.  She underwent lumbar fusion 02-01-13.  Completion of PT eval has been delayed by N&V, lethargy, and low BP.       Prior Functioning  Home Living Family/patient expects to be discharged to:: Private residence Living Arrangements: Spouse/significant other Available Help at Discharge: Family;Available 24 hours/day Type of Home: House Home Access: Ramped entrance Home Layout: Multi-level;Full bath on main level;Able to live on main level with bedroom/bathroom Home Equipment: Shower seat - built in;Walker - 2 wheels;Bedside commode;Grab bars - toilet Prior Function Level of Independence: Needs assistance Gait / Transfers Assistance Needed: Ambulatory short distances in the house with RW. ADL's / Homemaking Assistance Needed: Pt's husband provides varying levels of assist for ADLs/bathing depending on how pt feels. Communication Communication: No difficulties    Cognition  Cognition Arousal/Alertness: Lethargic Behavior During Therapy: WFL for tasks assessed/performed Overall Cognitive Status: Within Functional Limits for tasks assessed    Extremity/Trunk Assessment Upper Extremity Assessment Upper Extremity Assessment: Overall WFL for tasks assessed   Balance  Balance Balance Assessed:  Yes Static Sitting Balance Static Sitting - Balance Support: Bilateral upper extremity supported;Feet supported Static Sitting - Level of Assistance: 4: Min assist Static Sitting - Comment/# of Minutes: Pt sitting EOC and requiring min assist to maintain balance due to posterior lean.  Pt very fatigued and experiencing n/v, therefore having difficult time keeping herself balance at [facility].  End of Session PT - End of Session Equipment Utilized During Treatment: Gait belt;Back brace Activity Tolerance: Patient limited by lethargy;Treatment limited secondary to medical complications (Comment) (N&V, low BP) Patient left: in chair;with call bell/phone within reach Nurse Communication: Mobility status;Other (comment) (vomitting, lethargy)  GP     Ilda Foil 02/03/2013, 11:26 AM  Aida Raider, PT  Office # [phone] Pager [phone]

## 2013-02-04 ENCOUNTER — Encounter (HOSPITAL_COMMUNITY): Payer: Self-pay | Admitting: General Practice

## 2013-02-04 DIAGNOSIS — IMO0002 Reserved for concepts with insufficient information to code with codable children: Secondary | ICD-10-CM

## 2013-02-04 DIAGNOSIS — M47817 Spondylosis without myelopathy or radiculopathy, lumbosacral region: Secondary | ICD-10-CM

## 2013-02-04 LAB — GLUCOSE, CAPILLARY
Glucose-Capillary: 216 mg/dL — ABNORMAL HIGH (ref 70–99)
Glucose-Capillary: 206 mg/dL — ABNORMAL HIGH (ref 70–99)
Glucose-Capillary: 204 mg/dL — ABNORMAL HIGH (ref 70–99)

## 2013-02-04 MED FILL — Sodium Chloride Irrigation Soln 0.9%: Qty: 3000 | Status: AC

## 2013-02-04 MED FILL — Heparin Sodium (Porcine) Inj 1000 Unit/ML: INTRAMUSCULAR | Qty: 30 | Status: AC

## 2013-02-04 MED FILL — Sodium Chloride IV Soln 0.9%: INTRAVENOUS | Qty: 1000 | Status: AC

## 2013-02-04 NOTE — Progress Notes (Signed)
Occupational Therapy Treatment Patient Details Name: AILANI GOVERNALE MRN: [id number] DOB: [birth year] Today's Date: 02/04/2013 Time: 1027-1100 OT Time Calculation (min): 33 min  OT Assessment / Plan / Recommendation  OT comments  Pt slowly making progress toward goals. Pt performed stand pivot transfer to recliner chair. Pt limited by pain, fatigue, and nausea. Pt performed grooming tasks during session. Continue to recommend SNF for d/c.   Follow Up Recommendations  SNF    Barriers to Discharge       Equipment Recommendations  None recommended by OT    Recommendations for Other Services    Frequency Min 2X/week   Progress towards OT Goals Progress towards OT goals: Progressing toward goals  Plan Discharge plan remains appropriate    Precautions / Restrictions Precautions Precautions: Back Precaution Booklet Issued: No Precaution Comments: Pt educated on 3/3 back precautions. Required Braces or Orthoses: Spinal Brace Spinal Brace: Lumbar corset;Applied in sitting position   Pertinent Vitals/Pain Patient reported her pain as a 9/10. Nurse brought pain meds.      ADL  Grooming: Performed;Wash/dry face;Teeth care;Set up;Supervision/safety Where Assessed - Grooming: Supported sitting;Unsupported sitting Toilet Transfer: Simulated;+2 Total assistance Toilet Transfer: Patient Percentage: 50% Toilet Transfer Method: Stand pivot Toilet Transfer Equipment: Other (comment) (from elevated bed to recliner chair) Equipment Used: Gait belt;Back brace;Rolling walker Transfers/Ambulation Related to ADLs: +2 total A ADL Comments: Pt requiring +2 total A for transfer to chair. Pt limited due to pain. Pt brushed teeth while sitting in chair at supervision/setup level. OT educated to use cups to spit in to prevent bending and same as when standing at sink. Pt washed face while sitting EOB.  Spoke with pt briefly about AE that is available for LB ADLs.     OT Diagnosis:    OT Problem List:    OT Treatment Interventions:     OT Goals(current goals can now be found in the care plan section) Acute Rehab OT Goals Patient Stated Goal: to be able to walk OT Goal Formulation: With patient Time For Goal Achievement: 02/17/13 Potential to Achieve Goals: Good ADL Goals Pt Will Perform Grooming: standing;with min guard assist Pt Will Perform Lower Body Dressing: with min assist;with adaptive equipment;sit to/from stand Pt Will Transfer to Toilet: with min assist;ambulating;bedside commode;regular height toilet Pt Will Perform Toileting - Clothing Manipulation and hygiene: with min assist;sit to/from stand Additional ADL Goal #1: Pt will independently verbalize 3/3 back precautions.  Visit Information  Last OT Received On: 02/04/13 Assistance Needed: +2 PT/OT Co-Evaluation/Treatment: Yes History of Present Illness: Pt with lengthy h/o back pain.  She underwent lumbar fusion 02-01-13.  Completion of PT eval has been delayed by N&V, lethargy, and low BP.    Subjective Data      Prior Functioning  Home Living Family/patient expects to be discharged to:: Private residence Living Arrangements: Spouse/significant other    Cognition  Cognition Arousal/Alertness: Awake/alert Behavior During Therapy: WFL for tasks assessed/performed Overall Cognitive Status: Within Functional Limits for tasks assessed    Mobility  Bed Mobility Bed Mobility: Rolling Right;Right Sidelying to Sit;Sitting - Scoot to [place] of Bed Rolling Right: 1: +2 Total assist Rolling Right: Patient Percentage: 70% Right Sidelying to Sit: 1: +2 Total assist Right Sidelying to Sit: Patient Percentage: 50% Sitting - Scoot to Edge of Bed: 1: +2 Total assist Sitting - Scoot to Edge of Bed: Patient Percentage: 70% Details for Bed Mobility Assistance: VC to aid therapists as much as possible. Pt in significant pain requiring physical assist. Transfers  Transfers: Sit to Stand;Stand to Sit Sit to Stand: 1: +2 Total  assist;From bed Sit to Stand: Patient Percentage: 50% Stand to Sit: 1: +2 Total assist;To chair/3-in-1 Stand to Sit: Patient Percentage: 60% Details for Transfer Assistance: Pt requiring +2 physical assist to achieve a standing position. VC for hand placement and RW.    Exercises      Balance     End of Session OT - End of Session Equipment Utilized During Treatment: Gait belt;Back brace;Rolling walker Activity Tolerance: Patient limited by pain;Patient limited by fatigue;Other (comment) (became nauseous) Patient left: in chair;with call bell/phone within reach;with family/visitor present  GO     Earlie Raveling OTR/L [phone] 02/04/2013, 12:32 PM

## 2013-02-04 NOTE — Progress Notes (Signed)
Inpatient Diabetes Program Recommendations  AACE/ADA: New Consensus Statement on Inpatient Glycemic Control (2013)  Target Ranges:  Prepandial:   less than 140 mg/dL      Peak postprandial:   less than 180 mg/dL (1-2 hours)      Critically ill patients:  140 - 180 mg/dL   Hyperglycemia in 161 range.  Inpatient Diabetes Program Recommendations Insulin - Basal: Please increase lantus to 60 units at HS. Pt takes 90 units at home Insulin - Meal Coverage: Please add 4 units meal coverage when eating carb modified meals Pt takes 30 units humalog at home (? 10 units tid)  Thank you, Lenor Coffin, RN, CNS, Diabetes Coordinator [phone])

## 2013-02-04 NOTE — Progress Notes (Signed)
Physical Therapy Treatment Patient Details Name: Krista Wise MRN: [id number] DOB: 03/11/1946 Today's Date: 02/04/2013 Time: 2956-2130 PT Time Calculation (min): 29 min  PT Assessment / Plan / Recommendation  PT Comments   Pt slowly making progress toward goals. Patient was able to perform SPT to chair. She continues to be limited by pain, fatigue, and nausea. Pt currently requires +2 physical assist to perform functional activities. She will greatly benefit from SNF in order to improve her strength, exercise tolerance, and functional abilities.   Follow Up Recommendations  SNF;Supervision/Assistance - 24 hour     Does the patient have the potential to tolerate intense rehabilitation     Barriers to Discharge        Equipment Recommendations  None recommended by PT    Recommendations for Other Services    Frequency Min 5X/week   Progress towards PT Goals Progress towards PT goals: Progressing toward goals  Plan      Precautions / Restrictions Precautions Precautions: Back Precaution Comments:  (Pt educated on 3/3 back precautions.) Required Braces or Orthoses: Spinal Brace Spinal Brace: Lumbar corset;Applied in sitting position   Pertinent Vitals/Pain Patient reported her pain as a 9/10    Mobility  Bed Mobility Bed Mobility: Rolling Right;Right Sidelying to Sit;Sitting - Scoot to [place] of Bed Rolling Right: 1: +2 Total assist Rolling Right: Patient Percentage: 70% Right Sidelying to Sit: 1: +2 Total assist Right Sidelying to Sit: Patient Percentage: 50% Sitting - Scoot to Edge of Bed: 1: +2 Total assist Sitting - Scoot to Edge of Bed: Patient Percentage: 70% Details for Bed Mobility Assistance: VC to aid PT as much as possible. Pt in significant pain requiring physical assist. Transfers Transfers: Sit to Stand;Stand to Sit;Stand Pivot Transfers Sit to Stand: 1: +2 Total assist Sit to Stand: Patient Percentage: 50% Stand to Sit: 1: +2 Total assist Stand to Sit:  Patient Percentage: 60% Stand Pivot Transfers: 1: +2 Total assist Stand Pivot Transfers: Patient Percentage: 50% Details for Transfer Assistance: Pt requiring +2 physical assist to achieve a standing position. VC for hand placement and RW.    Exercises        PT Goals (current goals can now be found in the care plan section) Acute Rehab PT Goals Time For Goal Achievement: 02/17/13 Potential to Achieve Goals: Good  Visit Information  Last PT Received On: 02/04/13 Assistance Needed: +2 PT/OT Co-Evaluation/Treatment: Yes History of Present Illness: Pt with lengthy h/o back pain.  She underwent lumbar fusion 02-01-13.  Completion of PT eval has been delayed by N&V, lethargy, and low BP.    Subjective Data      Cognition  Cognition Arousal/Alertness: Awake/alert Behavior During Therapy: WFL for tasks assessed/performed Overall Cognitive Status: Within Functional Limits for tasks assessed    Balance     End of Session PT - End of Session Equipment Utilized During Treatment: Gait belt;Back brace;Oxygen Activity Tolerance: Patient limited by fatigue;Patient limited by pain Patient left: in chair;with call bell/phone within reach;with family/visitor present Nurse Communication: Mobility status   GP     Jolyn Nap, SPTA 02/04/2013, 11:44 AM

## 2013-02-04 NOTE — Progress Notes (Signed)
Seen and agreed 02/04/2013 Robinette, Julia Elizabeth PTA [phone] pager [phone] office    

## 2013-02-04 NOTE — Consult Note (Signed)
Physical Medicine and Rehabilitation Consult Reason for Consult: Lumbar stenosis with radiculopathy Referring Physician: Dr. Ophelia Charter   HPI: Krista Wise is a 67 y.o. right-handed female with history of diabetes mellitus peripheral neuropathy, coronary artery disease and diastolic congestive heart failure. Patient ambulating short distances with rolling walker prior to admission. Admitted 01/23/2013 with increasing low back pain radiating to the lower extremities. X-rays and imaging revealed lumbar L4-5, L5-S1 spondylosis, stenosis with radiculopathy. Underwent posterior lumbar fusion 2 levels L4-5, L5-S1 PLIF, pedicle instrumentation 02/01/2013 per Dr. Ophelia Charter. Postoperative pain management. Hospital course urinary retention after Foley catheter tube removed and later reinserted. Lumbar corset when out of bed applied in sitting position. Physical and occupational therapy evaluations completed 02/03/2013. M.D. has requested physical medicine rehabilitation consult to consider inpatient rehabilitation services   Review of Systems  Cardiovascular: Positive for palpitations.  Gastrointestinal:       GERD  Musculoskeletal: Positive for myalgias and back pain.  Neurological: Positive for headaches.  Psychiatric/Behavioral: Positive for depression.  All other systems reviewed and are negative.   Past Medical History  Diagnosis Date  . Hypertension   . Depression   . Diabetes mellitus   . GERD (gastroesophageal reflux disease)   . Obesity   . Coronary artery disease 08/2006    Unstable angina. PCI to distal codominant CFX with 2.5 x 20 Taxus DES...  . CHF (congestive heart failure)   . Sleep apnea     stopped cpap  . Arthritis   . ZOXWRUEA(540.9)    Past Surgical History  Procedure Laterality Date  . Coronary stent placement  08/2006    CAD; stent placed @ [facility]  . Total abdominal hysterectomy    . Cardiac catheterization      x2            mcclean   Family History  Problem  Relation Age of Onset  . Heart attack Father   . Heart failure Father   . Anemia Mother   . Hypertension Mother    Social History:  reports that she has never smoked. She has never used smokeless tobacco. She reports that she does not drink alcohol or use illicit drugs. Allergies: No Known Allergies Medications Prior to Admission  Medication Sig Dispense Refill  . atorvastatin (LIPITOR) 40 MG tablet Take 40 mg by mouth at bedtime.       . bisoprolol (ZEBETA) 5 MG tablet Take 1 tablet (5 mg total) by mouth daily.  30 tablet  0  . buPROPion (WELLBUTRIN SR) 150 MG 12 hr tablet Take 150 mg by mouth 2 (two) times daily.      . Cholecalciferol (VITAMIN D3) 1000 UNITS CAPS Take 1,000 Units by mouth daily.      . DULoxetine (CYMBALTA) 60 MG capsule Take 60 mg by mouth daily.      . furosemide (LASIX) 20 MG tablet Take 40 mg by mouth 2 (two) times daily.       Marland Kitchen HYDROcodone-acetaminophen (NORCO/VICODIN) 5-325 MG per tablet Take 1 tablet by mouth every 6 (six) hours as needed for pain.      Marland Kitchen insulin glargine (LANTUS) 100 UNIT/ML injection Inject 40 Units into the skin at bedtime.       Marland Kitchen lisinopril (PRINIVIL,ZESTRIL) 20 MG tablet Take 1 tablet (20 mg total) by mouth daily.      . metFORMIN (GLUCOPHAGE) 1000 MG tablet Take 1 tablet (1,000 mg total) by mouth 2 (two) times daily.  60 tablet  5  . methocarbamol (  ROBAXIN) 500 MG tablet Take 500 mg by mouth.       . Multiple Vitamin (MULTIVITAMIN) tablet Take 1 tablet by mouth daily.        . pantoprazole (PROTONIX) 20 MG tablet Take 20 mg by mouth daily.      . potassium chloride (K-LOR) 20 MEQ packet Take 20 mEq by mouth as needed.      . pregabalin (LYRICA) 75 MG capsule Take 75 mg by mouth 2 (two) times daily.        . Pyridoxine HCl (VITAMIN B-6) 500 MG tablet Take 500 mg by mouth daily.      Marland Kitchen rOPINIRole (REQUIP) 2 MG tablet Take 2 mg by mouth at bedtime.       . traMADol (ULTRAM) 50 MG tablet Take 50-100 mg by mouth 2 (two) times daily as needed.  For pain.      . traZODone (DESYREL) 50 MG tablet Take 50-100 mg by mouth at bedtime as needed. For sleep.      Marland Kitchen zolpidem (AMBIEN) 10 MG tablet Take 10 mg by mouth at bedtime as needed. For sleep.        Home: Home Living Family/patient expects to be discharged to:: Private residence Living Arrangements: Spouse/significant other Available Help at Discharge: Family;Available 24 hours/day Type of Home: House Home Access: Ramped entrance Home Layout: Multi-level;Full bath on main level;Able to live on main level with bedroom/bathroom Home Equipment: Shower seat - built in;Walker - 2 wheels;Bedside commode;Grab bars - toilet  Functional History:   Functional Status:  Mobility: Bed Mobility Bed Mobility: Not assessed Transfers Transfers: Not assessed;Sit to Stand Sit to Stand: 1: +2 Total assist      ADL: ADL Eating/Feeding: Performed;Set up Where Assessed - Eating/Feeding: Chair Grooming: Performed;Wash/dry face;Minimal assistance Where Assessed - Grooming: Supported sitting Equipment Used: Back brace Transfers/Ambulation Related to ADLs: Pt began to experience n/v before attempting to stand.  Repositoned pt back in chair and let her rest. ADL Comments: Session very limited due to pt experiencing n/v as PT/OT began to attempt mobility.  Pt also already fatigued due to having recently been bathed by nursing staff and then mobilized to chair with nursing assist.  Cognition: Cognition Overall Cognitive Status: Within Functional Limits for tasks assessed Orientation Level: Oriented X4 Cognition Arousal/Alertness: Lethargic Behavior During Therapy: WFL for tasks assessed/performed Overall Cognitive Status: Within Functional Limits for tasks assessed  Blood pressure 149/80, pulse 78, temperature 98.7 F (37.1 C), temperature source Oral, resp. rate 16, SpO2 92.00%. Physical Exam  Vitals reviewed. Constitutional: [name] oriented to person, place, and time.  HENT:  Head:  Normocephalic.  Eyes: EOM are normal.  Neck: Normal range of motion. Neck supple. No thyromegaly present.  Cardiovascular: Normal rate and regular rhythm.   Pulmonary/Chest: Effort normal and breath sounds normal. No respiratory distress.  Abdominal: Soft. Bowel sounds are normal. She exhibits no distension.  Neurological: [name] alert and oriented to person, place, and time.  Reflex Scores:      Tricep reflexes are 2+ on the right side and 2+ on the left side.      Bicep reflexes are 2+ on the right side and 2+ on the left side.      Brachioradialis reflexes are 2+ on the right side and 2+ on the left side.      Patellar reflexes are 1+ on the right side and 1+ on the left side.      Achilles reflexes are 1+ on the right side  and 1+ on the left side. Follows three-step commands. Decreased sensation to pp and LT below the ankles. Strength in legs diminished by pain in legs/back.   Skin:  Back incision dressed    Results for orders placed during the hospital encounter of 02/01/13 (from the past 24 hour(s))  GLUCOSE, CAPILLARY     Status: Abnormal   Collection Time    02/03/13  7:03 AM      Result Value Range   Glucose-Capillary 177 (*) 70 - 99 mg/dL   Comment 1 Documented in Chart     Comment 2 Notify RN    GLUCOSE, CAPILLARY     Status: Abnormal   Collection Time    02/03/13 11:19 AM      Result Value Range   Glucose-Capillary 205 (*) 70 - 99 mg/dL   Comment 1 Notify RN     Comment 2 Documented in Chart    URINALYSIS, ROUTINE W REFLEX MICROSCOPIC     Status: None   Collection Time    02/03/13 12:36 PM      Result Value Range   Color, Urine YELLOW  YELLOW   APPearance CLEAR  CLEAR   Specific Gravity, Urine 1.018  1.005 - 1.030   pH 5.0  5.0 - 8.0   Glucose, UA NEGATIVE  NEGATIVE mg/dL   Hgb urine dipstick NEGATIVE  NEGATIVE   Bilirubin Urine NEGATIVE  NEGATIVE   Ketones, ur NEGATIVE  NEGATIVE mg/dL   Protein, ur NEGATIVE  NEGATIVE mg/dL   Urobilinogen, UA 0.2  0.0 - 1.0  mg/dL   Nitrite NEGATIVE  NEGATIVE   Leukocytes, UA NEGATIVE  NEGATIVE  GLUCOSE, CAPILLARY     Status: Abnormal   Collection Time    02/03/13  4:09 PM      Result Value Range   Glucose-Capillary 214 (*) 70 - 99 mg/dL   Comment 1 Notify RN     Comment 2 Documented in Chart    GLUCOSE, CAPILLARY     Status: Abnormal   Collection Time    02/03/13  9:07 PM      Result Value Range   Glucose-Capillary 232 (*) 70 - 99 mg/dL   No results found.  Assessment/Plan: Diagnosis: lumbar spondylolisthesis, radiculopathy 1. Does the need for close, 24 hr/day medical supervision in concert with the patient's rehab needs make it unreasonable for this patient to be served in a less intensive setting? Potentially 2. Co-Morbidities requiring supervision/potential complications: cad, chf, vertigo 3. Due to bladder management, bowel management, safety, skin/wound care, disease management, medication administration, pain management and patient education, does the patient require 24 hr/day rehab nursing? Potentially 4. Does the patient require coordinated care of a physician, rehab nurse, PT, OT to address physical and functional deficits in the context of the above medical diagnosis(es)? Potentially Addressing deficits in the following areas: balance, endurance, locomotion, strength, transferring, bowel/bladder control, bathing and dressing 5. Can the patient actively participate in an intensive therapy program of at least 3 hrs of therapy per day at least 5 days per week? No 6. The potential for patient to make measurable gains while on inpatient rehab is fair 7. Anticipated functional outcomes upon discharge from inpatient rehab are n/a with PT, n/a with OT, n/a with SLP. 8. Estimated rehab length of stay to reach the above functional goals is: n/a 9. Does the patient have adequate social supports to accommodate these discharge functional goals? Potentially 10. Anticipated D/C setting: other 11. Anticipated  post D/C treatments: other 12.  Overall Rehab/Functional Prognosis: good and fair  RECOMMENDATIONS: This patient's condition is appropriate for continued rehabilitative care in the following setting: SNF Patient has agreed to participate in recommended program. Yes Note that insurance prior authorization may be required for reimbursement for recommended care.  Comment: Discussed with patient/family. This patient was needing quite a bit of help prior to admit. She continues to struggle with pain, and I doubt that she can tolerate CIR intensity. She and family agree.           Ranelle Oyster, MD, [facility] Physical Medicine & Rehabilitation     02/04/2013

## 2013-02-04 NOTE — Progress Notes (Signed)
UR COMPLETED  

## 2013-02-04 NOTE — Progress Notes (Signed)
Subjective: 3 Days Post-Op Procedure(s) (LRB): POSTERIOR LUMBAR FUSION 2 LEVEL (N/A) Patient reports pain as mild.   Still complains of left leg pain with ambulation PT rec SNF for rehab and pt agrees.  Objective: Vital signs in last 24 hours: Temp:  [98.3 F (36.8 C)-98.7 F (37.1 C)] 98.6 F (37 C) (07/14 1400) Pulse Rate:  [67-78] 67 (07/14 1400) Resp:  [16-20] 20 (07/14 1400) BP: (118-149)/(54-80) 136/64 mmHg (07/14 1400) SpO2:  [92 %-100 %] 100 % (07/14 1400)  Intake/Output from previous day: 07/13 0701 - 07/14 0700 In: 1440 [P.O.:240; I.V.:1200] Out: 1960 [Urine:1960] Intake/Output this shift: Total I/O In: -  Out: 900 [Urine:900]   Recent Labs  02/02/13 0435  HGB 8.8*    Recent Labs  02/02/13 0435  WBC 9.7  RBC 3.33*  HCT 28.0*  PLT 220    Recent Labs  02/02/13 0435  NA 137  K 4.4  CL 99  CO2 29  BUN 19  CREATININE 1.27*  GLUCOSE 223*  CALCIUM 7.9*   No results found for this basename: LABPT, INR,  in the last 72 hours  Neurovascular intact Incision: dressing C/D/I  Assessment/Plan: 3 Days Post-Op Procedure(s) (LRB): POSTERIOR LUMBAR FUSION 2 LEVEL (N/A) Discharge to SNF when bed available.  Krista Wise M 02/04/2013, 5:02 PM

## 2013-02-04 NOTE — Progress Notes (Signed)
Rehab admissions - Evaluated for possible admission.  Please see rehab consult done by Dr. Riley Kill recommending SNF.  Family are interested in [facility] and Child psychotherapist is working on placement.  Call me for questions.  #[phone]

## 2013-02-05 ENCOUNTER — Inpatient Hospital Stay (HOSPITAL_COMMUNITY): Payer: Medicare Other

## 2013-02-05 LAB — GLUCOSE, CAPILLARY
Glucose-Capillary: 160 mg/dL — ABNORMAL HIGH (ref 70–99)
Glucose-Capillary: 191 mg/dL — ABNORMAL HIGH (ref 70–99)

## 2013-02-05 NOTE — Progress Notes (Signed)
Inpatient Diabetes Program Recommendations  AACE/ADA: New Consensus Statement on Inpatient Glycemic Control (2013)  Target Ranges:  Prepandial:   less than 140 mg/dL      Peak postprandial:   less than 180 mg/dL (1-2 hours)      Critically ill patients:  140 - 180 mg/dL   Post-prandial hyperglycemia Please add novolog meal coverage, 4 units tidwc. (Pt takes total of 30 units/day)  Thank you, Lenor Coffin, RN, CNS, Diabetes Coordinator [phone])

## 2013-02-05 NOTE — Progress Notes (Signed)
Physical Therapy Treatment Patient Details Name: Krista Wise MRN: [id number] DOB: 1945-09-18 Today's Date: 02/05/2013 Time: 7829-5621 PT Time Calculation (min): 17 min  PT Assessment / Plan / Recommendation  PT Comments   Pt is slowly making progress toward PT goals, but continues to be greatly limited due to pain and nausea. Pt is requiring less assistance for bed mobility activities, with little improvement performing transfers. PT will continue to follow.  Follow Up Recommendations  SNF;Supervision/Assistance - 24 hour     Does the patient have the potential to tolerate intense rehabilitation     Barriers to Discharge        Equipment Recommendations  None recommended by PT    Recommendations for Other Services    Frequency Min 5X/week   Progress towards PT Goals Progress towards PT goals: Progressing toward goals  Plan      Precautions / Restrictions Precautions Precautions: Back Precaution Comments: Pt educated on 3/3 back precautions. Required Braces or Orthoses: Spinal Brace Spinal Brace: Lumbar corset;Applied in sitting position   Pertinent Vitals/Pain Pt c/o of 9/10 pain in her back. Pt had received pain medication from nursing ~1 hour prior to Tx.    Mobility  Bed Mobility Bed Mobility: Right Sidelying to Sit;Sitting - Scoot to Edge of Bed Right Sidelying to Sit: 4: Min assist Sitting - Scoot to [place] of Bed: 4: Min assist Details for Bed Mobility Assistance: VC for hand placement and to push off of bed to sit upright.  Transfers Transfers: Sit to Stand;Stand to Sit;Stand Pivot Transfers Sit to Stand: 1: +2 Total assist;From bed Sit to Stand: Patient Percentage: 50% Stand to Sit: 1: +2 Total assist;To chair/3-in-1 Stand to Sit: Patient Percentage: 70% Stand Pivot Transfers: 1: +2 Total assist Stand Pivot Transfers: Patient Percentage: 60% Details for Transfer Assistance: Pt requiring physical assist to achieve a standing position. VC for hand placement and  avoidance of bending due to back precuations. Ambulation/Gait Ambulation/Gait Assistance: Not tested (comment) Wheelchair Mobility Wheelchair Mobility: No    Exercises       PT Goals (current goals can now be found in the care plan section) Acute Rehab PT Goals Time For Goal Achievement: 02/17/13 Potential to Achieve Goals: Good  Visit Information  Last PT Received On: 02/05/13 Assistance Needed: +2 History of Present Illness: Pt with lengthy h/o back pain.  She underwent lumbar fusion 02-01-13.  Completion of PT eval has been delayed by N&V, lethargy, and low BP.    Subjective Data      Cognition  Cognition Arousal/Alertness: Awake/alert Behavior During Therapy: WFL for tasks assessed/performed Overall Cognitive Status: Within Functional Limits for tasks assessed    Balance     End of Session PT - End of Session Equipment Utilized During Treatment: Gait belt;Back brace Activity Tolerance: Patient limited by fatigue;Patient limited by pain Patient left: in chair;with call bell/phone within reach;with family/visitor present Nurse Communication: Mobility status   GP     Jolyn Nap, SPTA 02/05/2013, 1:36 PM

## 2013-02-05 NOTE — Progress Notes (Signed)
Seen and agreed 02/05/2013 Aayana Reinertsen Elizabeth PTA [phone] pager [phone] office    

## 2013-02-05 NOTE — Progress Notes (Addendum)
Subjective: 4 Days Post-Op Procedure(s) (LRB): POSTERIOR LUMBAR FUSION 2 LEVEL (N/A) Patient reports pain as moderate.    Objective: Vital signs in last 24 hours: Temp:  [97.7 F (36.5 C)-98.6 F (37 C)] 97.7 F (36.5 C) (07/15 0533) Pulse Rate:  [63-67] 63 (07/15 0533) Resp:  [16-20] 16 (07/15 0533) BP: (113-136)/(42-64) 113/43 mmHg (07/15 0533) SpO2:  [94 %-100 %] 94 % (07/15 0533)  Intake/Output from previous day: 07/14 0701 - 07/15 0700 In: -  Out: 2150 [Urine:2150] Intake/Output this shift:    No results found for this basename: HGB,  in the last 72 hours No results found for this basename: WBC, RBC, HCT, PLT,  in the last 72 hours No results found for this basename: NA, K, CL, CO2, BUN, CREATININE, GLUCOSE, CALCIUM,  in the last 72 hours No results found for this basename: LABPT, INR,  in the last 72 hours  Neurologically intact  /co left leg pain , no back pain.  AT [facility] working right and left  Assessment/Plan: 4 Days Post-Op Procedure(s) (LRB): POSTERIOR LUMBAR FUSION 2 LEVEL (N/A) Discharge to SNF  tranferring to chair, C/O left leg pain which makes her unable to walk. Quads HS GS AT all working  [employer] C 02/05/2013, 8:00 AM Will check lumbar CT with continued problems with left leg pain with getting up.

## 2013-02-06 LAB — GLUCOSE, CAPILLARY
Glucose-Capillary: 139 mg/dL — ABNORMAL HIGH (ref 70–99)
Glucose-Capillary: 211 mg/dL — ABNORMAL HIGH (ref 70–99)

## 2013-02-06 MED ORDER — OXYCODONE-ACETAMINOPHEN 5-325 MG PO TABS: 1.0000 | ORAL_TABLET | ORAL | Status: DC | PRN

## 2013-02-06 MED ORDER — INSULIN ASPART 100 UNIT/ML ~~LOC~~ SOLN
4.0000 [IU] | Freq: Three times a day (TID) | SUBCUTANEOUS | Status: DC
  Administered 2013-02-06 – 2013-02-07 (×2): 4 [IU] via SUBCUTANEOUS

## 2013-02-06 MED ORDER — METHOCARBAMOL 500 MG PO TABS: 500.0000 mg | ORAL_TABLET | Freq: Four times a day (QID) | ORAL | Status: DC | PRN

## 2013-02-06 NOTE — Progress Notes (Signed)
Physical Therapy Treatment Patient Details Name: Krista Wise MRN: [id number] DOB: 1945-09-29 Today's Date: 02/06/2013 Time: 4098-1191 PT Time Calculation (min): 19 min  PT Assessment / Plan / Recommendation  PT Comments   Pt made good progress during today's treatment. Pt tolerated PT Tx much better today. Pt was able to sit EOB 5 min without c/o of nausea and able to ambulate today. However, pt continues to be limited by significant pain and fatigue. PT will continue to follow.  Follow Up Recommendations  SNF;Supervision/Assistance - 24 hour     Does the patient have the potential to tolerate intense rehabilitation     Barriers to Discharge        Equipment Recommendations  None recommended by PT    Recommendations for Other Services    Frequency Min 5X/week   Progress towards PT Goals Progress towards PT goals: Progressing toward goals  Plan      Precautions / Restrictions Precautions Precautions: Back Precaution Booklet Issued: No Precaution Comments: Pt educated on 3/3 back precautions. Required Braces or Orthoses: Spinal Brace Spinal Brace: Lumbar corset;Applied in sitting position Restrictions Weight Bearing Restrictions: No   Pertinent Vitals/Pain Pt c/o pain down her LLE as 8/10 and pain in her back as 5/10.    Mobility  Bed Mobility Bed Mobility: Right Sidelying to Sit;Sitting - Scoot to Edge of Bed Rolling Right: 4: Min assist Right Sidelying to Sit: 3: Mod assist Sitting - Scoot to Edge of Bed: 4: Min assist Details for Bed Mobility Assistance: VC for hand placement and log rolling technique. Transfers Transfers: Sit to Stand;Stand to Sit Sit to Stand: 3: Mod assist Stand to Sit: 3: Mod assist Details for Transfer Assistance: Pt requiring physical assist to achieve a standing position. VC for hand placement and avoidance of bending due to back precuations. Ambulation/Gait Ambulation/Gait Assistance: 3: Mod assist Ambulation Distance (Feet): 12  Feet Assistive device: Rolling walker Ambulation/Gait Assistance Details: VC to maintain upright posture and RW usage Gait Pattern: Step-to pattern;Decreased stride length;Trunk flexed Gait velocity: slow    Exercises          PT Goals (current goals can now be found in the care plan section) Acute Rehab PT Goals Time For Goal Achievement: 02/17/13 Potential to Achieve Goals: Good  Visit Information  Last PT Received On: 02/06/13 Assistance Needed: +2 (for ambulation) History of Present Illness: Pt with lengthy h/o back pain.  She underwent lumbar fusion 02-01-13.  Completion of PT eval has been delayed by N&V, lethargy, and low BP.    Subjective Data      Cognition  Cognition Arousal/Alertness: Awake/alert Behavior During Therapy: WFL for tasks assessed/performed Overall Cognitive Status: Within Functional Limits for tasks assessed    Balance     End of Session PT - End of Session Equipment Utilized During Treatment: Gait belt;Back brace Activity Tolerance: Patient limited by fatigue;Patient limited by pain Patient left: in bed;with call bell/phone within reach;with family/visitor present Nurse Communication: Mobility status   GP     Jolyn Nap, SPTA 02/06/2013, 12:02 PM

## 2013-02-06 NOTE — Progress Notes (Signed)
Patient ID: Krista Wise, female   DOB: 1945-10-06, 67 y.o.   MRN: [id number] Pt and husband spoke with DR Ophelia Charter about discharge and pt will plan on going home in am after PT.  HHPT/OT and BA ordered for home.  DME available.   rx and instructions on chart.

## 2013-02-06 NOTE — Progress Notes (Signed)
Seen and agreed 02/06/2013 Shuan Statzer Elizabeth PTA [phone] pager [phone] office    

## 2013-02-07 DIAGNOSIS — E118 Type 2 diabetes mellitus with unspecified complications: Secondary | ICD-10-CM

## 2013-02-07 LAB — GLUCOSE, CAPILLARY
Glucose-Capillary: 200 mg/dL — ABNORMAL HIGH (ref 70–99)
Glucose-Capillary: 252 mg/dL — ABNORMAL HIGH (ref 70–99)

## 2013-02-07 NOTE — Progress Notes (Signed)
Physical Therapy Treatment Patient Details Name: Krista Wise MRN: [id number] DOB: Apr 19, 1946 Today's Date: 02/07/2013 Time: 4098-1191 PT Time Calculation (min): 13 min  PT Assessment / Plan / Recommendation  PT Comments   Pt made large improvements toward PT goals during today's Tx. Pt able to ambulate increased distance and quality this Tx session. PT will continue to follow.  Follow Up Recommendations  SNF;Supervision/Assistance - 24 hour     Does the patient have the potential to tolerate intense rehabilitation     Barriers to Discharge        Equipment Recommendations  None recommended by PT    Recommendations for Other Services    Frequency Min 5X/week   Progress towards PT Goals Progress towards PT goals: Progressing toward goals  Plan Current plan remains appropriate    Precautions / Restrictions Precautions Precautions: Back Precaution Booklet Issued: No Precaution Comments: Pt educated on 3/3 back precautions. Required Braces or Orthoses: Spinal Brace Spinal Brace: Lumbar corset;Applied in sitting position Restrictions Weight Bearing Restrictions: No   Pertinent Vitals/Pain Patient reported 3/10 pain in her back.    Mobility  Bed Mobility Bed Mobility: Not assessed Transfers Transfers: Sit to Stand;Stand to Sit Sit to Stand: 4: Min assist Stand to Sit: 4: Min assist Details for Transfer Assistance: Pt performed transfer safely requiring min A to extend trunk and VC for hand placement. Ambulation/Gait Ambulation/Gait Assistance: 4: Min assist Ambulation Distance (Feet): 50 Feet Assistive device: Rolling walker Ambulation/Gait Assistance Details: VC to maintain RW proximity. Gait Pattern: Step-to pattern;Decreased stride length;Trunk flexed Gait velocity: slow Stairs: No    Exercises        PT Goals (current goals can now be found in the care plan section) Acute Rehab PT Goals Time For Goal Achievement: 02/17/13 Potential to Achieve Goals:  Good  Visit Information  Last PT Received On: 02/07/13 Assistance Needed: +2 History of Present Illness: Pt with lengthy h/o back pain.  She underwent lumbar fusion 02-01-13.  Completion of PT eval has been delayed by N&V, lethargy, and low BP.    Subjective Data      Cognition  Cognition Arousal/Alertness: Awake/alert Behavior During Therapy: WFL for tasks assessed/performed Overall Cognitive Status: Within Functional Limits for tasks assessed    Balance     End of Session PT - End of Session Equipment Utilized During Treatment: Gait belt;Back brace Activity Tolerance: Patient tolerated treatment well Patient left: in chair;with call bell/phone within reach;with family/visitor present Nurse Communication: Mobility status   GP     Jolyn Nap, SPTA 02/07/2013, 12:58 PM

## 2013-02-07 NOTE — Progress Notes (Signed)
Seen and agreed 02/07/2013 Jermiya Reichl Elizabeth PTA [phone] pager [phone] office    

## 2013-02-09 ENCOUNTER — Emergency Department (HOSPITAL_COMMUNITY): Payer: Medicare Other

## 2013-02-09 ENCOUNTER — Encounter (HOSPITAL_COMMUNITY): Payer: Self-pay | Admitting: Nurse Practitioner

## 2013-02-09 ENCOUNTER — Emergency Department (HOSPITAL_COMMUNITY)
Admission: EM | Admit: 2013-02-09 | Discharge: 2013-02-09 | Disposition: A | Discharge status: Home / Self Care | Payer: Medicare Other | Attending: Emergency Medicine | Admitting: Emergency Medicine

## 2013-02-09 DIAGNOSIS — Z79899 Other long term (current) drug therapy: Secondary | ICD-10-CM | POA: Insufficient documentation

## 2013-02-09 DIAGNOSIS — I10 Essential (primary) hypertension: Secondary | ICD-10-CM | POA: Insufficient documentation

## 2013-02-09 DIAGNOSIS — M545 Low back pain, unspecified: Secondary | ICD-10-CM | POA: Insufficient documentation

## 2013-02-09 DIAGNOSIS — Z8669 Personal history of other diseases of the nervous system and sense organs: Secondary | ICD-10-CM | POA: Insufficient documentation

## 2013-02-09 DIAGNOSIS — E119 Type 2 diabetes mellitus without complications: Secondary | ICD-10-CM | POA: Insufficient documentation

## 2013-02-09 DIAGNOSIS — Z8739 Personal history of other diseases of the musculoskeletal system and connective tissue: Secondary | ICD-10-CM | POA: Insufficient documentation

## 2013-02-09 DIAGNOSIS — Z8679 Personal history of other diseases of the circulatory system: Secondary | ICD-10-CM | POA: Insufficient documentation

## 2013-02-09 DIAGNOSIS — G8928 Other chronic postprocedural pain: Secondary | ICD-10-CM | POA: Insufficient documentation

## 2013-02-09 DIAGNOSIS — I509 Heart failure, unspecified: Secondary | ICD-10-CM | POA: Insufficient documentation

## 2013-02-09 DIAGNOSIS — I2510 Atherosclerotic heart disease of native coronary artery without angina pectoris: Secondary | ICD-10-CM | POA: Insufficient documentation

## 2013-02-09 DIAGNOSIS — M549 Dorsalgia, unspecified: Secondary | ICD-10-CM

## 2013-02-09 DIAGNOSIS — E669 Obesity, unspecified: Secondary | ICD-10-CM | POA: Insufficient documentation

## 2013-02-09 DIAGNOSIS — Z043 Encounter for examination and observation following other accident: Secondary | ICD-10-CM | POA: Insufficient documentation

## 2013-02-09 DIAGNOSIS — K219 Gastro-esophageal reflux disease without esophagitis: Secondary | ICD-10-CM | POA: Insufficient documentation

## 2013-02-09 DIAGNOSIS — W19XXXA Unspecified fall, initial encounter: Secondary | ICD-10-CM

## 2013-02-09 MED ORDER — OXYCODONE-ACETAMINOPHEN 5-325 MG PO TABS
1.0000 | ORAL_TABLET | Freq: Once | ORAL | Status: AC
  Administered 2013-02-09: 1 via ORAL
  Filled 2013-02-09: qty 1

## 2013-02-09 MED ORDER — DIAZEPAM 5 MG PO TABS
5.0000 mg | ORAL_TABLET | Freq: Once | ORAL | Status: AC
  Administered 2013-02-09: 5 mg via ORAL
  Filled 2013-02-09: qty 1

## 2013-02-09 NOTE — ED Notes (Signed)
Pt had back surgery fusion last week. Reports she has continued to have back pain radiating into BLE since the surgery. Pt states the pain was so bad yesterday she fell trying to get out of the car, but did not have any injuries. Pt is taking vicodin with no relief. A&Ox4, resp e/u

## 2013-02-09 NOTE — ED Provider Notes (Signed)
She has ongoing leg pain and difficulty walking, that preceded her recent back surgery. The difficulty was present upon discharge, 2 days ago. She has plans to go to a rehabilitation facility, today. She came here to see if anything was broken in the fall. Imaging, today, does not indicate hardware complication or new bony injury. Doubt acute spinal injury. Patient was evaluated, by me. Her musculoskeletal and neurologic exam are stable from her recent baseline.  Assessment ongoing low back pain postoperatively. She can be managed in a rehabilitation facility, she can go to, as an out patient.   Medical screening examination/treatment/procedure(s) were conducted as a shared visit with non-physician practitioner(s) and myself.  I personally evaluated the patient during the encounter  Flint Melter, MD 02/10/13 [phone]

## 2013-02-09 NOTE — ED Provider Notes (Signed)
History    This chart was scribed for non-physician practitioner Jaynie Crumble, PA-C, working with Flint Melter, MD, by Yevette Edwards, ED Scribe. This patient was seen in room TR06C/TR06C and the patient's care was started at 5:01 PM.  CSN: [id number] Arrival date & time 02/09/13  1641  First MD Initiated Contact with Patient 02/09/13 1652     Chief Complaint  Patient presents with  . Back Pain    The history is provided by the patient. No language interpreter was used.   HPI Comments: Krista Wise is a 67 y.o. female who presents to the Emergency Department complaining of back pain which radiates to her left leg. The pt reports she had back fusion surgery a week ago, and that she was discharged from the hospital yesterday. She also reports that she has experienced back pain since the surgery. She states that yesterday the pain was so severe that she lost her balance and fell upon her side while getting out of her wheel chair athme, but she denies any injuries due to the fall. After the fall, her therapist advised her to visit the ED to have her back assessed. The pt has a place at [facility] for rehab, but she has not yet decided if she will go there . [name] taking hydrocodone for the pain, but without resolution.   Past Medical History  Diagnosis Date  . Hypertension   . Depression   . Diabetes mellitus   . GERD (gastroesophageal reflux disease)   . Obesity   . Coronary artery disease 08/2006    Unstable angina. PCI to distal codominant CFX with 2.5 x 20 Taxus DES...  . CHF (congestive heart failure)   . Sleep apnea     stopped cpap  . Arthritis   . [id number](478.2)    Past Surgical History  Procedure Laterality Date  . Coronary stent placement  08/2006    CAD; stent placed @ [facility]  . Total abdominal hysterectomy    . Cardiac catheterization      x2            mcclean  . Lumbar fusion  02/02/2013   Family History  Problem Relation Age of Onset  . Heart  attack Father   . Heart failure Father   . Anemia Mother   . Hypertension Mother    History  Substance Use Topics  . Smoking status: Never Smoker   . Smokeless tobacco: Never Used  . Alcohol Use: No   No OB history provided.  Review of Systems  Constitutional: Negative for fever.  Musculoskeletal: Positive for back pain.  All other systems reviewed and are negative.    Allergies  Review of patient's allergies indicates no known allergies.  Home Medications   Current Outpatient Rx  Name  Route  Sig  Dispense  Refill  . atorvastatin (LIPITOR) 40 MG tablet   Oral   Take 40 mg by mouth at bedtime.          Marland Kitchen EXPIRED: bisoprolol (ZEBETA) 5 MG tablet   Oral   Take 1 tablet (5 mg total) by mouth daily.   30 tablet   0   . buPROPion (WELLBUTRIN SR) 150 MG 12 hr tablet   Oral   Take 150 mg by mouth 2 (two) times daily.         . Cholecalciferol (VITAMIN D3) 1000 UNITS CAPS   Oral   Take 1,000 Units by mouth daily.         Marland Kitchen  DULoxetine (CYMBALTA) 60 MG capsule   Oral   Take 60 mg by mouth daily.         . furosemide (LASIX) 20 MG tablet   Oral   Take 40 mg by mouth 2 (two) times daily.          Marland Kitchen EXPIRED: insulin glargine (LANTUS) 100 UNIT/ML injection   Subcutaneous   Inject 40 Units into the skin at bedtime.          Marland Kitchen lisinopril (PRINIVIL,ZESTRIL) 20 MG tablet   Oral   Take 1 tablet (20 mg total) by mouth daily.         . metFORMIN (GLUCOPHAGE) 1000 MG tablet   Oral   Take 1 tablet (1,000 mg total) by mouth 2 (two) times daily.   60 tablet   5   . methocarbamol (ROBAXIN) 500 MG tablet   Oral   Take 500 mg by mouth.          . methocarbamol (ROBAXIN) 500 MG tablet   Oral   Take 1 tablet (500 mg total) by mouth every 6 (six) hours as needed.   60 tablet   0   . Multiple Vitamin (MULTIVITAMIN) tablet   Oral   Take 1 tablet by mouth daily.           Marland Kitchen oxyCODONE-acetaminophen (PERCOCET/ROXICET) 5-325 MG per tablet   Oral   Take  1-2 tablets by mouth every 4 (four) hours as needed for pain.   60 tablet   0   . pantoprazole (PROTONIX) 20 MG tablet   Oral   Take 20 mg by mouth daily.         . potassium chloride (K-LOR) 20 MEQ packet   Oral   Take 20 mEq by mouth as needed.         . pregabalin (LYRICA) 75 MG capsule   Oral   Take 75 mg by mouth 2 (two) times daily.           . Pyridoxine HCl (VITAMIN B-6) 500 MG tablet   Oral   Take 500 mg by mouth daily.         Marland Kitchen rOPINIRole (REQUIP) 2 MG tablet   Oral   Take 2 mg by mouth at bedtime.          . traMADol (ULTRAM) 50 MG tablet   Oral   Take 50-100 mg by mouth 2 (two) times daily as needed. For pain.         . traZODone (DESYREL) 50 MG tablet   Oral   Take 50-100 mg by mouth at bedtime as needed. For sleep.         Marland Kitchen zolpidem (AMBIEN) 10 MG tablet   Oral   Take 10 mg by mouth at bedtime as needed. For sleep.          Triage Vitals: BP 154/76  Pulse 69  Temp(Src) 97.8 F (36.6 C) (Oral)  Resp 16  SpO2 99%  Physical Exam  Nursing note and vitals reviewed. Constitutional: [name] oriented to person, place, and time. She appears well-developed and well-nourished. No distress.  HENT:  Head: Normocephalic and atraumatic.  Eyes: EOM are normal.  Neck: Neck supple. No tracheal deviation present.  Cardiovascular: Normal rate.   Pulmonary/Chest: Effort normal. No respiratory distress.  Musculoskeletal: She exhibits tenderness.  Healing incision to lumbar spine. Non-tender to palpation. No surrounding erythema. Tender above her right SI joint. Pain with bilateral straight leg raise.  Neurological: [name] alert and oriented to person, place, and time.  5/5 and equal strength of bilateral lower extremities. Normal strength with dorsi and plantar flexion and extension.   Skin: Skin is warm and dry.  Psychiatric: She has a normal mood and affect. Her behavior is normal.    ED Course  Procedures (including critical care  time)  DIAGNOSTIC STUDIES: Oxygen Saturation is 99% on room air, normal by my interpretation.    COORDINATION OF CARE:  5:07 PM Discussed treatment plan with patient which includes an x-ray of her back and pain medication, and the patient agreed to the plan.     Labs Reviewed - No data to display Dg Lumbar Spine Complete  02/09/2013   *RADIOLOGY REPORT*  Clinical Data: Fall, diffuse low back pain  LUMBAR SPINE - COMPLETE 4+ VIEW  Comparison: CT lumbar spine 02/05/2013  Findings: Amorphous calcification/ossification is re-identified adjacent to posterior component of the fusion hardware spanning L4- S1.  No change in bony alignment or appearance of hardware since prior exam. Vertebral body heights are maintained.  Stable degree of mild anterolisthesis L5 on S1 and L4 on L5.  Atheromatous aortic calcification noted.  Mild leftward curvature centered at L3 again noted.  IMPRESSION: No acute lumbar spine abnormality or evidence for hardware failure. No change since prior recent exam.   Original Report Authenticated By: Christiana Pellant, M.D.   1. Back pain   2. Fall at home, initial encounter     MDM  Pt with lumbar fusion 8 days ago. Per chart review, it was recommended that pt would go to SNF, however she refused and wanted to try to stay at home with help of her husband. Yesterday she was transferring from a wheel chair, and she fell. Denies any injuries but states that back is hurting worse. X-rays obtained, and are negative. Pt is neurovascularly intact.  Pt does have a place at [facility] for rehab. Recommended that may be the best thing for her right now to heal and to stay safe and not fall any more. Pt agrees. Discussed with Dr. Effie Shy. Plan to d/c home with follow up.   Filed Vitals:   02/09/13 1646  BP: 154/76  Pulse: 69  Temp: 97.8 F (36.6 C)  TempSrc: Oral  Resp: 16  SpO2: 99%    I personally performed the services described in this documentation, which was scribed in my  presence. The recorded information has been reviewed and is accurate.    Lottie Mussel, PA-C 02/10/13 0008

## 2013-02-09 NOTE — Discharge Instructions (Signed)
Continue your pain medications. Heating pads. Please try to go to [facility] or another skilled rehab facility to help with your rehab to reduce pain, increase strength, and prevent falls.   Follow up with Dr. Ophelia Charter.

## 2013-02-09 NOTE — ED Notes (Signed)
PA at bedside.

## 2013-02-11 ENCOUNTER — Encounter: Payer: Self-pay | Admitting: Internal Medicine

## 2013-02-11 ENCOUNTER — Non-Acute Institutional Stay (SKILLED_NURSING_FACILITY): Payer: Medicare Other | Admitting: Internal Medicine

## 2013-02-11 DIAGNOSIS — G2581 Restless legs syndrome: Secondary | ICD-10-CM

## 2013-02-11 DIAGNOSIS — M4316 Spondylolisthesis, lumbar region: Secondary | ICD-10-CM

## 2013-02-11 DIAGNOSIS — I10 Essential (primary) hypertension: Secondary | ICD-10-CM

## 2013-02-11 DIAGNOSIS — F3289 Other specified depressive episodes: Secondary | ICD-10-CM

## 2013-02-11 DIAGNOSIS — E785 Hyperlipidemia, unspecified: Secondary | ICD-10-CM

## 2013-02-11 DIAGNOSIS — R269 Unspecified abnormalities of gait and mobility: Secondary | ICD-10-CM

## 2013-02-11 DIAGNOSIS — F329 Major depressive disorder, single episode, unspecified: Secondary | ICD-10-CM

## 2013-02-11 DIAGNOSIS — M4310 Spondylolisthesis, site unspecified: Secondary | ICD-10-CM

## 2013-02-11 DIAGNOSIS — R195 Other fecal abnormalities: Secondary | ICD-10-CM

## 2013-02-11 DIAGNOSIS — E118 Type 2 diabetes mellitus with unspecified complications: Secondary | ICD-10-CM

## 2013-02-11 DIAGNOSIS — R2681 Unsteadiness on feet: Secondary | ICD-10-CM

## 2013-02-11 NOTE — Discharge Summary (Signed)
Physician Discharge Summary  Patient ID: CARLENA RUYBAL MRN: [id number] DOB/AGE: March 05, 1946 67 y.o.  Admit date: 02/01/2013 Discharge date: 02/07/2013  Admission Diagnoses:  Spondylolisthesis of lumbar region L4-5 and L5-S1  Discharge Diagnoses:  Principal Problem:   Spondylolisthesis of lumbar region Active Problems:   Diabetes mellitus type 2 with complications   Past Medical History  Diagnosis Date  . Hypertension   . Depression   . Diabetes mellitus   . GERD (gastroesophageal reflux disease)   . Obesity   . Coronary artery disease 08/2006    Unstable angina. PCI to distal codominant CFX with 2.5 x 20 Taxus DES...  . CHF (congestive heart failure)   . Sleep apnea     stopped cpap  . Arthritis   . Headache(784.0)     Surgeries: Procedure(s): POSTERIOR LUMBAR FUSION 2 LEVEL, L4-5 and L5-S1, on 02/01/2013   Consultants (if any):  Swartz,MD  Discharged Condition: Improved  Hospital Course: JOSUE KASS is an 67 y.o. female who was admitted 02/01/2013 with a diagnosis of Spondylolisthesis of lumbar region and went to the operating room on 02/01/2013 and underwent the above named procedures.    She was given perioperative antibiotics:  Anti-infectives   Start     Dose/Rate Route Frequency Ordered Stop   02/01/13 2030  ceFAZolin (ANCEF) IVPB 1 g/50 mL premix     1 g 100 mL/hr over 30 Minutes Intravenous Every 8 hours 02/01/13 2015 02/02/13 0526   02/01/13 0916  ceFAZolin (ANCEF) 2-3 GM-% IVPB SOLR    Comments:  FARIES, BRIDGET: cabinet override      02/01/13 0916 02/01/13 2129   02/01/13 0915  ceFAZolin (ANCEF) IVPB 2 g/50 mL premix     2 g 100 mL/hr over 30 Minutes Intravenous On call to O.R. 02/01/13 0912 02/01/13 1105    Pt slow in progressive PT,however she was ambulatory in the room with PT before discharge.  CT scan of the lumbar spine done secondary to continued complaints of leg pain.  Finding indicate only appropriate post op changes without any new nerve  compression.   DME, HHPT and OT as well as bath aide made available for pt.  She was not felt to be a candidate for inpt rehab and did no desire nursing home at discharge. Sent home in stable condition.  She was given sequential compression devices, early ambulation for DVT prophylaxis.  She benefited maximally from the hospital stay and there were no complications.    Recent vital signs:  Filed Vitals:   02/07/13 0500  BP: 118/50  Pulse: 67  Temp: 98.3 F (36.8 C)  Resp: 20    Recent laboratory studies:  Lab Results  Component Value Date   HGB 8.8* 02/02/2013   HGB 12.4 01/21/2013   HGB 10.9* 08/31/2011   Lab Results  Component Value Date   WBC 9.7 02/02/2013   PLT 220 02/02/2013   Lab Results  Component Value Date   INR 1.0 10/21/2010   Lab Results  Component Value Date   NA 137 02/02/2013   K 4.4 02/02/2013   CL 99 02/02/2013   CO2 29 02/02/2013   BUN 19 02/02/2013   CREATININE 1.27* 02/02/2013   GLUCOSE 223* 02/02/2013    Discharge Medications:     Medication List    STOP taking these medications       HYDROcodone-acetaminophen 5-325 MG per tablet  Commonly known as:  NORCO/VICODIN      TAKE these medications  atorvastatin 40 MG tablet  Commonly known as:  LIPITOR  Take 40 mg by mouth at bedtime.     buPROPion 150 MG 12 hr tablet  Commonly known as:  WELLBUTRIN SR  Take 150 mg by mouth 2 (two) times daily.     DULoxetine 60 MG capsule  Commonly known as:  CYMBALTA  Take 60 mg by mouth daily.     furosemide 20 MG tablet  Commonly known as:  LASIX  Take 40 mg by mouth 2 (two) times daily.     lisinopril 20 MG tablet  Commonly known as:  PRINIVIL,ZESTRIL  Take 1 tablet (20 mg total) by mouth daily.     metFORMIN 1000 MG tablet  Commonly known as:  GLUCOPHAGE  Take 1 tablet (1,000 mg total) by mouth 2 (two) times daily.     methocarbamol 500 MG tablet  Commonly known as:  ROBAXIN  Take 500 mg by mouth every 6 (six) hours. Taking scheduled -  every 6 hours while awake     pantoprazole 20 MG tablet  Commonly known as:  PROTONIX  Take 20 mg by mouth daily.     rOPINIRole 2 MG tablet  Commonly known as:  REQUIP  Take 2 mg by mouth at bedtime.     traZODone 50 MG tablet  Commonly known as:  DESYREL  Take 100 mg by mouth at bedtime. For sleep.     vitamin B-6 500 MG tablet  Take 500 mg by mouth daily.     Vitamin D3 1000 UNITS Caps  Take 1,000 Units by mouth daily.     zolpidem 10 MG tablet  Commonly known as:  AMBIEN  Take 10 mg by mouth at bedtime. For sleep.        Diagnostic Studies: Dg Chest 2 View  01/21/2013   *RADIOLOGY REPORT*  Clinical Data: Preoperative respiratory examination.  History of hypertension.  CHEST - 2 VIEW  Comparison: 04/20/2011 and 08/30/2011.  Findings: There is stable cardiomegaly and chronic vascular congestion.  The right basilar aeration has mildly improved.  There is no focal airspace disease or pleural effusion.  The bones appear unchanged.  IMPRESSION: Stable chronic cardiomegaly and vascular congestion.  No acute findings demonstrated.   Original Report Authenticated By: Carey Bullocks, M.D.   Dg Lumbar Spine 2-3 Views  02/01/2013   *RADIOLOGY REPORT*  Clinical Data: Spondylolisthesis.  DG C-ARM GT 120 MIN,LUMBAR SPINE - 2-3 VIEW  Comparison: CT myelogram dated 04/18/2011  Findings: AP and lateral C-arm images demonstrate the patient has undergone interbody and posterior fusions at L4-5 and L5 S1. Interbody fusion devices, pedicle screws, and posterior rods appear in good position in the AP and lateral projections.  IMPRESSION: Fusions performed at L4-5 and at L5 S1.   Original Report Authenticated By: Francene Boyers, M.D.   Dg Lumbar Spine Complete  02/09/2013   *RADIOLOGY REPORT*  Clinical Data: Fall, diffuse low back pain  LUMBAR SPINE - COMPLETE 4+ VIEW  Comparison: CT lumbar spine 02/05/2013  Findings: Amorphous calcification/ossification is re-identified adjacent to posterior component  of the fusion hardware spanning L4- S1.  No change in bony alignment or appearance of hardware since prior exam. Vertebral body heights are maintained.  Stable degree of mild anterolisthesis L5 on S1 and L4 on L5.  Atheromatous aortic calcification noted.  Mild leftward curvature centered at L3 again noted.  IMPRESSION: No acute lumbar spine abnormality or evidence for hardware failure. No change since prior recent exam.   Original Report  Authenticated By: Christiana Pellant, M.D.   Ct Lumbar Spine Wo Contrast  02/05/2013   *RADIOLOGY REPORT*  Clinical Data: Postop TLIF.   Left leg weakness.  Unable to stand.  CT LUMBAR SPINE WITHOUT CONTRAST  Technique:  Multidetector CT imaging of the lumbar spine was performed without intravenous contrast administration. Multiplanar CT image reconstructions were also generated.  Comparison: CT myelogram 04/18/2011.  Findings: The patient is status post L4-S1 posterolateral and interbody fusion.  The hardware is intact and appropriately placed. Interbody cages are centered within the L4-5 and L5-S1 interspaces.  Posterolateral bone chips overlie the transverse processes.  6 mm anterolisthesis L5-S1 is improved from preop.  4 mm anterolisthesis at L4-5 is similar to slightly worse than previous. No paraspinous hematoma. Moderate atheromatous change of the aorta.  With regard to left leg pain, there is no apparent adjacent segment pathology or left-sided compression at the L3-4 level. The spinal canal and neural foramina appear well decompressed at the surgical levels.  There is considerable Hounsfield artifact related to pedicle screw and rod placement at the L4-5 and L5-S1 levels which precludes evaluation of the spinal canal at these levels.  If there is concern for intraspinal space-occupying lesion or hematoma, and there are no contraindications, MRI could provide additional information.  IMPRESSION: No obvious hardware malposition or post surgical complication leading to left  leg pain and weakness.  If concern remains for postoperative complication or space-occupying lesion in the canal, MRI could provide additional information.  See discussion above.   Original Report Authenticated By: Davonna Belling, M.D.   Dg C-arm Gt 120 Min  02/01/2013   *RADIOLOGY REPORT*  Clinical Data: Spondylolisthesis.  DG C-ARM GT 120 MIN,LUMBAR SPINE - 2-3 VIEW  Comparison: CT myelogram dated 04/18/2011  Findings: AP and lateral C-arm images demonstrate the patient has undergone interbody and posterior fusions at L4-5 and L5 S1. Interbody fusion devices, pedicle screws, and posterior rods appear in good position in the AP and lateral projections.  IMPRESSION: Fusions performed at L4-5 and at L5 S1.   Original Report Authenticated By: Francene Boyers, M.D.    Disposition: 01-Home or Self Care      Discharge Orders   Future Orders Complete By Expires     Call MD / Call 911  As directed     Comments:      If you experience chest pain or shortness of breath, CALL 911 and be transported to the hospital emergency room.  If you develope a fever above 101 F, pus (white drainage) or increased drainage or redness at the wound, or calf pain, call your surgeon's office.    Constipation Prevention  As directed     Comments:      Drink plenty of fluids.  Prune juice may be helpful.  You may use a stool softener, such as Colace (over the counter) 100 mg twice a day.  Use MiraLax (over the counter) for constipation as needed.    Diet - low sodium heart healthy  As directed     Discharge instructions  As directed     Comments:      Call if there is increasing drainage, fever greater than 101.5, severe head aches, and worsening nausea or light sensitivity. If shortness of breath, bloody cough or chest tightness or pain go to an emergency room. No lifting greater than 10 lbs. Ice packs as needed for pain and swelling to back Avoid bending, stooping and twisting. Use brace when sitting and out of bed  even to  go to bathroom. Walk in house for first 2 weeks then may start to get out slowly increasing distances up to one mile by 4-6 weeks post op. After 5 days may shower and change dressing following bathing with shower.When bathing remove the brace shower and replace brace before getting out of the shower. If drainage, keep dry dressing and do not bathe the incision, use an moisture impervious dressing. Please call and return for scheduled follow up appointment 2 weeks from the time of surgery.    Driving restrictions  As directed     Comments:      No driving    Increase activity slowly as tolerated  As directed     Lifting restrictions  As directed     Comments:      No lifting       Follow-up Information   Follow up with YATES,MARK C, MD. Schedule an appointment as soon as possible for a visit in 2 weeks.   Contact information:   [address] [place] [address] [phone]        Signed: Wende Neighbors 02/11/2013, 4:42 PM

## 2013-02-11 NOTE — Progress Notes (Signed)
Patient ID: Krista Wise, female   DOB: [birth year], 67 y.o.   MRN: [id number]    PCP: [name], NP  Code Status: full code  No Known Allergies  Chief Complaint  Patient presents with  . Hospitalization Follow-up    HPI:  67 y/o female paient had undergone lumbar fusion a week back and then went home after refusing to go to the SNF for rehab. On 02/10/13, she was transferring from her wheelchair and had a fall. Her back pain worsened and she went to the ED where xrays were negative. Discharge to SNF for STR was recommended and pt agreed this time and is here for STR She was seen in her room today. She complaints of loose stool for 3 days. She had received a suppository 3 days back following which this started. She was constipated prior to this. She also had fever of 100.7 yesterday and was associated with chills. She received tylenol and has remained afebrile since then. She has not started working with therapy team yet  Review of Systems  Constitutional: Positive for malaise/fatigue. Negative for fever, chills and weight loss.  HENT: Negative for congestion.   Respiratory: Negative for cough, shortness of breath and wheezing.   Cardiovascular: Negative for chest pain, palpitations and leg swelling.  Gastrointestinal: Positive for diarrhea. Negative for heartburn and abdominal pain.  Genitourinary: Negative for dysuria.  Musculoskeletal: Positive for back pain. Negative for falls.  Skin: Negative for rash.  Neurological: Negative for dizziness, seizures and headaches.  Psychiatric/Behavioral: Positive for memory loss. Negative for depression.     Past Medical History  Diagnosis Date  . Hypertension   . Depression   . Diabetes mellitus   . GERD (gastroesophageal reflux disease)   . Obesity   . Coronary artery disease 08/2006    Unstable angina. PCI to distal codominant CFX with 2.5 x 20 Taxus DES...  . CHF (congestive heart failure)   . Sleep apnea     stopped cpap  .  Arthritis   . WUJWJXBJ(478.2)    Past Surgical History  Procedure Laterality Date  . Coronary stent placement  08/2006    CAD; stent placed @ [facility]  . Total abdominal hysterectomy    . Cardiac catheterization      x2            [facility]  . Lumbar fusion  02/02/2013   Social History:   reports that she has never smoked. She has never used smokeless tobacco. She reports that she does not drink alcohol or use illicit drugs.  Family History  Problem Relation Age of Onset  . Heart attack Father   . Heart failure Father   . Anemia Mother   . Hypertension Mother     Medications: Patient's Medications  New Prescriptions   No medications on file  Previous Medications   ATORVASTATIN (LIPITOR) 40 MG TABLET    Take 40 mg by mouth at bedtime.    BISOPROLOL (ZEBETA) 5 MG TABLET    Take 5 mg by mouth daily.   BUPROPION (WELLBUTRIN SR) 150 MG 12 HR TABLET    Take 150 mg by mouth 2 (two) times daily.   CHOLECALCIFEROL (VITAMIN D3) 1000 UNITS CAPS    Take 1,000 Units by mouth daily.   DULOXETINE (CYMBALTA) 60 MG CAPSULE    Take 60 mg by mouth daily.   FUROSEMIDE (LASIX) 20 MG TABLET    Take 40 mg by mouth 2 (two) times daily.    INSULIN  GLARGINE (LANTUS) 100 UNIT/ML INJECTION    Inject 45 Units into the skin at bedtime.   LISINOPRIL (PRINIVIL,ZESTRIL) 20 MG TABLET    Take 1 tablet (20 mg total) by mouth daily.   METFORMIN (GLUCOPHAGE) 1000 MG TABLET    Take 1 tablet (1,000 mg total) by mouth 2 (two) times daily.   METHOCARBAMOL (ROBAXIN) 500 MG TABLET    Take 500 mg by mouth every 6 (six) hours. Taking scheduled - every 6 hours while awake   MULTIPLE VITAMIN (MULTIVITAMIN WITH MINERALS) TABS    Take 1 tablet by mouth daily.   OXYCODONE-ACETAMINOPHEN (PERCOCET/ROXICET) 5-325 MG PER TABLET    Take 1 tablet by mouth every 4 (four) hours as needed for pain.   PANTOPRAZOLE (PROTONIX) 20 MG TABLET    Take 20 mg by mouth daily.   POTASSIUM CHLORIDE SA (K-DUR,KLOR-CON) 20 MEQ TABLET    Take 20 mEq  by mouth daily.   PREGABALIN (LYRICA) 75 MG CAPSULE    Take 75 mg by mouth 2 (two) times daily.   PYRIDOXINE HCL (VITAMIN B-6) 500 MG TABLET    Take 500 mg by mouth daily.   ROPINIROLE (REQUIP) 2 MG TABLET    Take 2 mg by mouth at bedtime.    TRAZODONE (DESYREL) 50 MG TABLET    Take 100 mg by mouth at bedtime. For sleep.   ZOLPIDEM (AMBIEN) 10 MG TABLET    Take 10 mg by mouth at bedtime. For sleep.  Modified Medications   No medications on file  Discontinued Medications   No medications on file     Physical Exam: Filed Vitals:   02/11/13 1404  BP: 118/70  Pulse: 76  Temp: 98.7 F (37.1 C)  Resp: 18  SpO2: 96%     Constitutional: [name] oriented to person and place, is obese and in no acute distress HENT:   Head: Normocephalic and atraumatic.  Eyes: EOM are normal.  Neck: Neck supple. No tracheal deviation present.  Cardiovascular: Normal rate.   Pulmonary/Chest: Effort normal. No respiratory distress.  Abdomen- bowel sounds present, soft, non tender, no guarding or rigidity Musculoskeletal: She exhibits tenderness.  Healing incision to lumbar spine. Non-tender to palpation. No surrounding erythema. Pain with bilateral straight leg raise.    Neurological: [name] alert and oriented to person, place, and time.  5/5 and equal strength of bilateral lower extremities. Normal strength with dorsi and plantar flexion and extension.   Skin: Skin is warm and dry.  Psychiatric: She has a normal mood and affect. Her behavior is normal.    Labs reviewed: Basic Metabolic Panel:  Recent Labs  84/69/62 1453 01/21/13 1440 02/02/13 0435  NA 133* 136 137  K 3.8 3.5 4.4  CL 90* 91* 99  CO2 28 29 29   GLUCOSE 482* 461* 223*  BUN 21 15 19   CREATININE 1.5* 1.14* 1.27*  CALCIUM 8.4 8.5 7.9*   CBC:  Recent Labs  01/21/13 1440 02/02/13 0435  WBC 8.0 9.7  HGB 12.4 8.8*  HCT 37.9 28.0*  MCV 82.4 84.1  PLT 195 220   CBG:  Recent Labs  02/06/13 2320 02/07/13 0619  02/07/13 1049  GLUCAP 211* 200* 252*    Assessment/Plan  Lumbar radiculopathy- s/p fusion. Incision site healing well. Continue current pain regimen of percocet, pain under control. Also continue muscle relaxants. To work with PT/OT, fall precautions  Gait instability- will need gait training and muscle strengthening exercises. Fall precautions.   Depression- continue wellbutrin and duloxetine to  help with mood. Continue trazodone to help with her sleep as well along with prn ambien  HTN- bp well controlled. Will continue bisoprolol, lisinopril, lasix and statin  Dm- monitor cbg, continue lantus and metformin. Continue lisinopril, statin and lyrica for neuropathic pain  Restless leg syndrome- stable with requip, monitor clinically  Hyperlipidemia- continue lipitor for now, monitor clinically  Loose stool- likely is residual effect of her suppository. Will provide fiber to help bulk her stool. imodium for now prn. If no improvement, will rule out infectious etiology  Unclear about single febrile episode. Will monitor clinically for now. If has another temperature spike, will get cbc with diff and cmp and assess further.   Family/ staff Communication: reviewed care plan with patient and nursing supervisor   Labs/tests ordered- cbc, cmp

## 2013-02-12 ENCOUNTER — Non-Acute Institutional Stay (SKILLED_NURSING_FACILITY): Payer: Medicare Other | Admitting: Adult Health

## 2013-02-12 ENCOUNTER — Encounter: Payer: Self-pay | Admitting: Adult Health

## 2013-02-12 DIAGNOSIS — D649 Anemia, unspecified: Secondary | ICD-10-CM | POA: Insufficient documentation

## 2013-02-12 MED ORDER — FERROUS SULFATE 325 (65 FE) MG PO TABS: 325.0000 mg | ORAL_TABLET | Freq: Two times a day (BID) | ORAL | Status: DC

## 2013-02-12 NOTE — Assessment & Plan Note (Signed)
Her current hgb is 7.5; will begin her iron 325 mg twice daily; will guaiac stools X3 ; and repeat cbc in one week; we have discussed in detail how to avoid dizziness and what symptoms to inform the nursing staff about she verbalized understanding.

## 2013-02-12 NOTE — Progress Notes (Signed)
Patient ID: Krista Wise, female   DOB: Dec 26, 1945, 67 y.o.   MRN: [id number]  [facility]  No Known Allergies   Chief Complaint  Patient presents with  . Acute Visit    follow up lab work    HPI: Her hgb has dropped from 8.8; on 02-02-13 to 7.5 today. [name] postop lumbar fusion. She denies any change in her bowel habits; no blood in stool. She does have some dizziness with standing. Per epic records she did receive a blood transfusion.   Past Medical History  Diagnosis Date  . Hypertension   . Depression   . Diabetes mellitus   . GERD (gastroesophageal reflux disease)   . Obesity   . Coronary artery disease 08/2006    Unstable angina. PCI to distal codominant CFX with 2.5 x 20 Taxus DES...  . CHF (congestive heart failure)   . Sleep apnea     stopped cpap  . Arthritis   . WUJWJXBJ(478.2)     Past Surgical History  Procedure Laterality Date  . Coronary stent placement  08/2006    CAD; stent placed @ [facility]  . Total abdominal hysterectomy    . Cardiac catheterization      x2            [facility]le[facility]  . Lumbar fusion  02/02/2013    VITAL SIGNS BP 118/70  Pulse 70   Patient's Medications  New Prescriptions   No medications on file  Previous Medications   ATORVASTATIN (LIPITOR) 40 MG TABLET    Take 40 mg by mouth at bedtime.    BISOPROLOL (ZEBETA) 5 MG TABLET    Take 5 mg by mouth daily.   BUPROPION (WELLBUTRIN SR) 150 MG 12 HR TABLET    Take 150 mg by mouth 2 (two) times daily.   CHOLECALCIFEROL (VITAMIN D3) 1000 UNITS CAPS    Take 1,000 Units by mouth daily.   DULOXETINE (CYMBALTA) 60 MG CAPSULE    Take 60 mg by mouth daily.   FUROSEMIDE (LASIX) 20 MG TABLET    Take 40 mg by mouth 2 (two) times daily.    INSULIN GLARGINE (LANTUS) 100 UNIT/ML INJECTION    Inject 45 Units into the skin at bedtime.   LISINOPRIL (PRINIVIL,ZESTRIL) 20 MG TABLET    Take 1 tablet (20 mg total) by mouth daily.   METFORMIN (GLUCOPHAGE) 1000 MG TABLET    Take 1 tablet (1,000 mg total) by  mouth 2 (two) times daily.   METHOCARBAMOL (ROBAXIN) 500 MG TABLET    Take 500 mg by mouth every 6 (six) hours. Taking scheduled - every 6 hours while awake   MULTIPLE VITAMIN (MULTIVITAMIN WITH MINERALS) TABS    Take 1 tablet by mouth daily.   OXYCODONE-ACETAMINOPHEN (PERCOCET/ROXICET) 5-325 MG PER TABLET    Take 1 tablet by mouth every 4 (four) hours as needed for pain.   PANTOPRAZOLE (PROTONIX) 20 MG TABLET    Take 20 mg by mouth daily.   POTASSIUM CHLORIDE SA (K-DUR,KLOR-CON) 20 MEQ TABLET    Take 20 mEq by mouth daily.   PREGABALIN (LYRICA) 75 MG CAPSULE    Take 75 mg by mouth 2 (two) times daily.   PYRIDOXINE HCL (VITAMIN B-6) 500 MG TABLET    Take 500 mg by mouth daily.   ROPINIROLE (REQUIP) 2 MG TABLET    Take 2 mg by mouth at bedtime.    TRAZODONE (DESYREL) 50 MG TABLET    Take 100 mg by mouth at bedtime.  For sleep.   ZOLPIDEM (AMBIEN) 10 MG TABLET    Take 10 mg by mouth at bedtime. For sleep.  Modified Medications   No medications on file  Discontinued Medications   No medications on file    SIGNIFICANT DIAGNOSTIC EXAMS     Component Value Date/Time   ALBUMIN 2.7* 08/30/2011 1116   AST 16 08/30/2011 1116   ALT 9 08/30/2011 1116   ALKPHOS 57 08/30/2011 1116   BILITOT 0.2* 08/30/2011 1116       Component Value Date/Time   BUN 19 02/02/2013 0435   GLUCOSE 223* 02/02/2013 0435   CREATININE 1.27* 02/02/2013 0435   CREATININE 2.10* 02/10/2012 1516   K 4.4 02/02/2013 0435   NA 137 02/02/2013 0435   TSH 3.456 08/31/2011 0200       Component Value Date/Time   WBC 9.7 02/02/2013 0435   RBC 3.33* 02/02/2013 0435   HGB 8.8* 02/02/2013 0435   HCT 28.0* 02/02/2013 0435   PLT 220 02/02/2013 0435   MCV 84.1 02/02/2013 0435   02-11-13: wbc 13.7; hgb 7.7; hct 25.7 ;mcv 88.6 plt 356; glucose 164; bun 23; creat 1.2; k+ 4.2;na++137 Liver normal albumin 2.8 02-12-13: wbc 11.2; hgb 7.5; hct 25.7; mcv 88.4 plt 347; glucose 82; bun 21; creat 1.1; k+ 3.5;na++139 Liver normal albumin 2.7    Review of  Systems  Constitutional: Negative for fever and malaise/fatigue.  Eyes: Negative for blurred vision.  Respiratory: Negative for cough and shortness of breath.   Cardiovascular: Negative for chest pain and palpitations.  Gastrointestinal: Negative for heartburn, diarrhea and blood in stool.  Musculoskeletal: Negative for myalgias and joint pain.  Skin: Negative.   Neurological: Negative for dizziness, weakness and headaches.  Psychiatric/Behavioral: The patient does not have insomnia.     Physical Exam  Constitutional: [name] oriented to person, place, and time. She appears well-developed and well-nourished.  overweight  Neck: Normal range of motion. Neck supple. No JVD present. No thyromegaly present.  Cardiovascular: Normal rate, regular rhythm and intact distal pulses.   Respiratory: Effort normal and breath sounds normal. No respiratory distress.  GI: Soft. Bowel sounds are normal. She exhibits no distension and no mass. There is no tenderness.  Musculoskeletal: Normal range of motion. She exhibits no edema.  Neurological: [name] alert and oriented to person, place, and time.  Skin: Skin is warm and dry.      ASSESSMENT/ PLAN:  Anemia Her current hgb is 7.5; will begin her iron 325 mg twice daily; will guaiac stools X3 ; and repeat cbc in one week; we have discussed in detail how to avoid dizziness and what symptoms to inform the nursing staff about she verbalized understanding.

## 2013-02-15 ENCOUNTER — Encounter: Payer: Self-pay | Admitting: Adult Health

## 2013-02-15 ENCOUNTER — Non-Acute Institutional Stay (SKILLED_NURSING_FACILITY): Payer: Medicare Other | Admitting: Adult Health

## 2013-02-15 DIAGNOSIS — E118 Type 2 diabetes mellitus with unspecified complications: Secondary | ICD-10-CM

## 2013-02-15 DIAGNOSIS — D649 Anemia, unspecified: Secondary | ICD-10-CM

## 2013-02-15 MED ORDER — INSULIN ASPART 100 UNIT/ML ~~LOC~~ SOLN: 5.0000 [IU] | Freq: Three times a day (TID) | SUBCUTANEOUS | Status: DC

## 2013-02-15 NOTE — Assessment & Plan Note (Signed)
Her hgb a1c is 11.1; she is taking lantus 45 units nightly and metformin 1 gm twice daily. Will begin her novolog 5 units prior to meals for cbg >= to 150. Will have nursing check her cbg at hs without coverage at this time; will monitor her status

## 2013-02-15 NOTE — Assessment & Plan Note (Signed)
Her hgb is stable at 7.7; will continue her iron twice daily and will continue to monitor her status; she is not complaining of vertigo at this time.

## 2013-02-15 NOTE — Progress Notes (Signed)
Patient ID: Krista Wise, female   DOB: [birth year], 67 y.o.   MRN: [id number]  [facility]  No Known Allergies   Chief Complaint  Patient presents with  . Acute Visit    follow up diabetes    HPI: Her hgb a1c is 11.1. [name] aware that her diabetes is not well controlled. [name] willing work at getting her diabetes under better control. We discussed her diet at length. We did discuss her anemia as well it is presently stable.   Past Medical History  Diagnosis Date  . Hypertension   . Depression   . Diabetes mellitus   . GERD (gastroesophageal reflux disease)   . Obesity   . Coronary artery disease 08/2006    Unstable angina. PCI to distal codominant CFX with 2.5 x 20 Taxus DES...  . CHF (congestive heart failure)   . Sleep apnea     stopped cpap  . Arthritis   . [id number](578.4)     Past Surgical History  Procedure Laterality Date  . Coronary stent placement  08/2006    CAD; stent placed @ [facility]  . Total abdominal hysterectomy    . Cardiac catheterization      x2            mcclean  . Lumbar fusion  02/02/2013    VITAL SIGNS BP 121/61  Pulse 63  Wt 238 lb 9.6 oz (108.228 kg)  BMI 38.53 kg/m2   Patient's Medications  New Prescriptions   No medications on file  Previous Medications   ATORVASTATIN (LIPITOR) 40 MG TABLET    Take 40 mg by mouth at bedtime.    BISOPROLOL (ZEBETA) 5 MG TABLET    Take 5 mg by mouth daily.   BUPROPION (WELLBUTRIN SR) 150 MG 12 HR TABLET    Take 150 mg by mouth 2 (two) times daily.   CHOLECALCIFEROL (VITAMIN D3) 1000 UNITS CAPS    Take 1,000 Units by mouth daily.   DULOXETINE (CYMBALTA) 60 MG CAPSULE    Take 60 mg by mouth daily.   FERROUS SULFATE 325 (65 FE) MG TABLET    Take 1 tablet (325 mg total) by mouth 2 (two) times daily.   FUROSEMIDE (LASIX) 20 MG TABLET    Take 40 mg by mouth 2 (two) times daily.    INSULIN GLARGINE (LANTUS) 100 UNIT/ML INJECTION    Inject 45 Units into the skin at bedtime.   LISINOPRIL  (PRINIVIL,ZESTRIL) 20 MG TABLET    Take 1 tablet (20 mg total) by mouth daily.   METFORMIN (GLUCOPHAGE) 1000 MG TABLET    Take 1 tablet (1,000 mg total) by mouth 2 (two) times daily.   METHOCARBAMOL (ROBAXIN) 500 MG TABLET    Take 500 mg by mouth every 6 (six) hours. Taking scheduled - every 6 hours while awake   MULTIPLE VITAMIN (MULTIVITAMIN WITH MINERALS) TABS    Take 1 tablet by mouth daily.   OXYCODONE-ACETAMINOPHEN (PERCOCET/ROXICET) 5-325 MG PER TABLET    Take 1 tablet by mouth every 4 (four) hours as needed for pain.   PANTOPRAZOLE (PROTONIX) 20 MG TABLET    Take 20 mg by mouth daily.   POTASSIUM CHLORIDE SA (K-DUR,KLOR-CON) 20 MEQ TABLET    Take 20 mEq by mouth daily.   PREGABALIN (LYRICA) 75 MG CAPSULE    Take 75 mg by mouth 2 (two) times daily.   PYRIDOXINE HCL (VITAMIN B-6) 500 MG TABLET    Take 500 mg by mouth daily.  ROPINIROLE (REQUIP) 2 MG TABLET    Take 2 mg by mouth at bedtime.    TRAZODONE (DESYREL) 50 MG TABLET    Take 100 mg by mouth at bedtime. For sleep.   ZOLPIDEM (AMBIEN) 10 MG TABLET    Take 10 mg by mouth at bedtime. For sleep.  Modified Medications   No medications on file  Discontinued Medications   No medications on file    SIGNIFICANT DIAGNOSTIC EXAMS  02-02-13: wbc 9.7; hgb 8.8; hct 28; mcv 84.1 plt 220  02-11-13: wbc 13.7; hgb 7.7; hct 25.7 ;mcv 88.6 plt 356; glucose 164; bun 23; creat 1.2; k+ 4.2;na++137 Liver normal albumin 2.8; chol 94; ldl 52; trig 138; vit d 24.5; vit b6: 11.0 hgb a1c 11.1  02-12-13: wbc 11.2; hgb 7.5; hct 25.7; mcv 88.4 plt 347; glucose 82; bun 21; creat 1.1; k+ 3.5;na++139 Liver normal albumin 2.7      Review of Systems  Constitutional: Negative for fever and malaise/fatigue.  Eyes: Negative for blurred vision.  Respiratory: Negative for cough and shortness of breath.   Cardiovascular: Negative for chest pain and palpitations.  Gastrointestinal: Negative for heartburn, diarrhea and blood in stool.  Musculoskeletal: Negative for  myalgias and joint pain.  Skin: Negative.   Neurological: Negative for dizziness, weakness and headaches.  Psychiatric/Behavioral: The patient does not have insomnia.     Physical Exam  Constitutional: [name] oriented to person, place, and time. She appears well-developed and well-nourished.  overweight  Neck: Normal range of motion. Neck supple. No JVD present. No thyromegaly present.  Cardiovascular: Normal rate, regular rhythm and intact distal pulses.   Respiratory: Effort normal and breath sounds normal. No respiratory distress.  GI: Soft. Bowel sounds are normal. She exhibits no distension and no mass. There is no tenderness.  Musculoskeletal: Normal range of motion. She exhibits no edema.  Neurological: [name] alert and oriented to person, place, and time.  Skin: Skin is warm and dry.      ASSESSMENT/ PLAN:  Anemia Her hgb is stable at 7.7; will continue her iron twice daily and will continue to monitor her status; she is not complaining of vertigo at this time.   Diabetes mellitus type 2 with complications Her hgb a1c is 11.1; she is taking lantus 45 units nightly and metformin 1 gm twice daily. Will begin her novolog 5 units prior to meals for cbg >= to 150. Will have nursing check her cbg at hs without coverage at this time; will monitor her status

## 2013-02-20 ENCOUNTER — Non-Acute Institutional Stay (SKILLED_NURSING_FACILITY): Payer: Medicare Other | Admitting: Adult Health

## 2013-02-20 DIAGNOSIS — M4310 Spondylolisthesis, site unspecified: Secondary | ICD-10-CM

## 2013-02-20 DIAGNOSIS — M4316 Spondylolisthesis, lumbar region: Secondary | ICD-10-CM

## 2013-03-02 ENCOUNTER — Encounter: Payer: Self-pay | Admitting: Adult Health

## 2013-03-02 NOTE — Assessment & Plan Note (Signed)
[name] having back pain; due to her recent surgery; will get an x-ray  Of her lower spine. Will increase her neurontin to 400 mg twice daily. There are no signs of injury present will continue to monitor her status

## 2013-03-02 NOTE — Progress Notes (Signed)
Patient ID: Krista Wise, female   DOB: March 11, 1946, 67 y.o.   MRN: [id number]  [facility]  No Known Allergies   Chief Complaint  Patient presents with  . Acute Visit    status post fall    HPI: She has had a fall this morning. She was going to the bathroom when she fell. There was no loss of consciousness; no obvious signs of injury present. [name] complaining of increased back pain. She states she will ask for assistance in the future.    Past Medical History  Diagnosis Date  . Hypertension   . Depression   . Diabetes mellitus   . GERD (gastroesophageal reflux disease)   . Obesity   . Coronary artery disease 08/2006    Unstable angina. PCI to distal codominant CFX with 2.5 x 20 Taxus DES...  . CHF (congestive heart failure)   . Sleep apnea     stopped cpap  . Arthritis   . [id number](478.2)     Past Surgical History  Procedure Laterality Date  . Coronary stent placement  08/2006    CAD; stent placed @ [facility]  . Total abdominal hysterectomy    . Cardiac catheterization      x2            mcclean  . Lumbar fusion  02/02/2013    VITAL SIGNS BP 125/68  Pulse 70  Wt 238 lb (107.956 kg)  BMI 38.43 kg/m2   Patient's Medications  New Prescriptions   No medications on file  Previous Medications   ATORVASTATIN (LIPITOR) 40 MG TABLET    Take 40 mg by mouth at bedtime.    BISOPROLOL (ZEBETA) 5 MG TABLET    Take 5 mg by mouth daily.   BUPROPION (WELLBUTRIN SR) 150 MG 12 HR TABLET    Take 150 mg by mouth 2 (two) times daily.   CHOLECALCIFEROL (VITAMIN D3) 1000 UNITS CAPS    Take 1,000 Units by mouth daily.   DULOXETINE (CYMBALTA) 60 MG CAPSULE    Take 60 mg by mouth daily.   FERROUS SULFATE 325 (65 FE) MG TABLET    Take 1 tablet (325 mg total) by mouth 2 (two) times daily.   FUROSEMIDE (LASIX) 20 MG TABLET    Take 40 mg by mouth 2 (two) times daily.    INSULIN ASPART (NOVOLOG) 100 UNIT/ML INJECTION    Inject 5 Units into the skin 3 (three) times daily before meals.  For cbg >=150   INSULIN GLARGINE (LANTUS) 100 UNIT/ML INJECTION    Inject 45 Units into the skin at bedtime.   LISINOPRIL (PRINIVIL,ZESTRIL) 20 MG TABLET    Take 1 tablet (20 mg total) by mouth daily.   METFORMIN (GLUCOPHAGE) 1000 MG TABLET    Take 1 tablet (1,000 mg total) by mouth 2 (two) times daily.   METHOCARBAMOL (ROBAXIN) 500 MG TABLET    Take 500 mg by mouth every 6 (six) hours. Taking scheduled - every 6 hours while awake   MULTIPLE VITAMIN (MULTIVITAMIN WITH MINERALS) TABS    Take 1 tablet by mouth daily.   OXYCODONE-ACETAMINOPHEN (PERCOCET/ROXICET) 5-325 MG PER TABLET    Take 1 tablet by mouth every 4 (four) hours as needed for pain.   PANTOPRAZOLE (PROTONIX) 20 MG TABLET    Take 20 mg by mouth daily.   POTASSIUM CHLORIDE SA (K-DUR,KLOR-CON) 20 MEQ TABLET    Take 20 mEq by mouth daily.   PREGABALIN (LYRICA) 75 MG CAPSULE    Take  75 mg by mouth 2 (two) times daily.   PYRIDOXINE HCL (VITAMIN B-6) 500 MG TABLET    Take 500 mg by mouth daily.   ROPINIROLE (REQUIP) 2 MG TABLET    Take 2 mg by mouth at bedtime.    TRAZODONE (DESYREL) 50 MG TABLET    Take 100 mg by mouth at bedtime. For sleep.   ZOLPIDEM (AMBIEN) 10 MG TABLET    Take 10 mg by mouth at bedtime. For sleep.  Modified Medications   No medications on file  Discontinued Medications   No medications on file    SIGNIFICANT DIAGNOSTIC EXAMS   02-02-13: wbc 9.7; hgb 8.8; hct 28; mcv 84.1 plt 220  02-11-13: wbc 13.7; hgb 7.7; hct 25.7 ;mcv 88.6 plt 356; glucose 164; bun 23; creat 1.2; k+ 4.2;na++137 Liver normal albumin 2.8; chol 94; ldl 52; trig 138; vit d 24.5; vit b6: 11.0 hgb a1c 11.1  02-12-13: wbc 11.2; hgb 7.5; hct 25.7; mcv 88.4 plt 347; glucose 82; bun 21; creat 1.1; k+ 3.5;na++139 Liver normal albumin 2.7      Review of Systems  Constitutional: Negative for fever and malaise/fatigue.  Eyes: Negative for blurred vision.  Respiratory: Negative for cough and shortness of breath.   Cardiovascular: Negative for chest  pain and palpitations.  Gastrointestinal: Negative for heartburn, diarrhea and blood in stool.  Musculoskeletal: has back pain.   Skin: Negative.   Neurological: Negative for dizziness, weakness and headaches.  Psychiatric/Behavioral: The patient does not have insomnia.     Physical Exam  Constitutional: [name] oriented to person, place, and time. She appears well-developed and well-nourished.  overweight  Neck: Normal range of motion. Neck supple. No JVD present. No thyromegaly present.  Cardiovascular: Normal rate, regular rhythm and intact distal pulses.   Respiratory: Effort normal and breath sounds normal. No respiratory distress.  GI: Soft. Bowel sounds are normal. She exhibits no distension and no mass. There is no tenderness.  Musculoskeletal: Normal range of motion. She exhibits no edema.  Neurological: [name] alert and oriented to person, place, and time.  Skin: Skin is warm and dry.     ASSESSMENT/ PLAN:  Spondylolisthesis of lumbar region [name] having back pain; due to her recent surgery; will get an x-ray  Of her lower spine. Will increase her neurontin to 400 mg twice daily. There are no signs of injury present will continue to monitor her status

## 2013-03-08 ENCOUNTER — Non-Acute Institutional Stay (SKILLED_NURSING_FACILITY): Payer: Medicare Other | Admitting: Adult Health

## 2013-03-08 DIAGNOSIS — E1159 Type 2 diabetes mellitus with other circulatory complications: Secondary | ICD-10-CM

## 2013-03-08 DIAGNOSIS — M48061 Spinal stenosis, lumbar region without neurogenic claudication: Secondary | ICD-10-CM

## 2013-03-18 ENCOUNTER — Telehealth: Payer: Self-pay | Admitting: Cardiology

## 2013-03-18 NOTE — Telephone Encounter (Signed)
She can stay off Plavix.  We will need to check her oxygen levels on next office visit to determine whether she still needs it.

## 2013-03-18 NOTE — Telephone Encounter (Signed)
Spoke with patient. Pt states she has not been taking Plavix for 7 or 8 months, she was not taking it for several months before she had recent back surgery. She did restart aspirin 81 mg after her recent back surgery. Pt asking if she should restart Plavix.  [name] also asking to D/C oxygen. She states she did not use it in the hospital when she had back surgery and she has not used it since back surgery.

## 2013-03-18 NOTE — Telephone Encounter (Signed)
New Problem   Pt was advised to keep Oxygen equipment until she got out of the hospital// she hasnt needed it for a year and its very costly. Wants to call and have RX canceled with Home Health care.

## 2013-03-18 NOTE — Telephone Encounter (Signed)
New Problem  Pt recently had back surgery and wanted to know if she should go back to using plavix// request a call back to discuss.

## 2013-03-18 NOTE — Telephone Encounter (Signed)
I will forward to Dr McLean for review 

## 2013-03-19 NOTE — Telephone Encounter (Signed)
LMTCB

## 2013-03-19 NOTE — Telephone Encounter (Signed)
F/up ° °Pt returning call °

## 2013-03-19 NOTE — Telephone Encounter (Signed)
Pt advised, appt 04/05/13 with Dawayne Patricia to check oxygen levels to determine whether she still needs it.

## 2013-04-02 ENCOUNTER — Ambulatory Visit: Payer: Medicare Other | Admitting: Physician Assistant

## 2013-04-04 ENCOUNTER — Ambulatory Visit: Payer: Medicare Other | Admitting: Physician Assistant

## 2013-04-05 ENCOUNTER — Encounter: Payer: Self-pay | Admitting: Nurse Practitioner

## 2013-04-05 ENCOUNTER — Ambulatory Visit (INDEPENDENT_AMBULATORY_CARE_PROVIDER_SITE_OTHER): Payer: Medicare Other | Admitting: Nurse Practitioner

## 2013-04-05 VITALS — BP 140/62 | HR 70 | Ht 66.0 in | Wt 232.8 lb

## 2013-04-05 DIAGNOSIS — I2510 Atherosclerotic heart disease of native coronary artery without angina pectoris: Secondary | ICD-10-CM

## 2013-04-05 NOTE — Patient Instructions (Addendum)
Stay on your current medicines  See Dr. Shirlee Latch in 4 months  I will send a note to have your oxygen picked up by [facility]  Call the [facility] office at [phone] if you have any questions, problems or concerns.

## 2013-04-05 NOTE — Progress Notes (Signed)
Krista Wise Date of Birth: 1945-10-29 Medical Record #[id number]  History of Present Illness: Krista Wise is seen back today for a 3 month check. Seen for Dr. Shirlee Latch.She has a history of CAD with stent to the distal LCX in 2008, Dm, HTN, OHS/OSA, and diastolic CHF. She was recathed due to abnormal Myoview and echo back in April of 2012 which showed an 80 to 90% mid and distal LCX stenoses before and after the prior placed stent and a totally occluded moderate sized OM1. At that time she had DES x 2 placed.   Other issues include respiratory failure that required admission and bipap in February of 2013. Repeat Myoview at that time showed her EF to be 68%, moderate partially reversible lateral defect that actually looked better than her prior study of 2012. She also has diabetes, COPD, HTN, depression, obesity, GERD, fibromyalgia, minimal AS, and neuropathy.   Seen 3 months ago for a pre op clearance for back surgery. She was felt to be at increased risk from mine and Dr. Alford Highland standpoint.   Comes back today. Here with her husband. She remains in a wheelchair. She had her back surgery in July - went to rehab for 5 weeks - now back home. Doing pretty good. No chest pain. Her breathing is good. [name] not dizzy or lightheaded. Can walk with a walker for short distances. Her right leg is bothering her but Dr. Ophelia Charter says it will take time. She has had some eye surgery in the interim and did well. [name] not short of breath. No cough. No swelling Weight down 4 pounds. She has not used her oxygen in 1 1/2 years and wants it sent back. Tells me she has had all of her labs recently checked.   Current Outpatient Prescriptions  Medication Sig Dispense Refill  . aspirin EC 81 MG tablet Take 1 tablet (81 mg total) by mouth daily.      Marland Kitchen atorvastatin (LIPITOR) 40 MG tablet Take 40 mg by mouth at bedtime.       . bisoprolol (ZEBETA) 5 MG tablet Take 5 mg by mouth daily.      Marland Kitchen buPROPion (WELLBUTRIN SR) 150 MG 12  hr tablet Take 150 mg by mouth 2 (two) times daily.      . Cholecalciferol (VITAMIN D3) 1000 UNITS CAPS Take 1,000 Units by mouth daily.      . DULoxetine (CYMBALTA) 60 MG capsule Take 60 mg by mouth daily.      . ferrous sulfate 325 (65 FE) MG tablet Take 1 tablet (325 mg total) by mouth 2 (two) times daily.  60 tablet  3  . furosemide (LASIX) 20 MG tablet Take 40 mg by mouth 2 (two) times daily.       . insulin aspart (NOVOLOG) 100 UNIT/ML injection Inject 5 Units into the skin 3 (three) times daily before meals. For cbg >=150  3 vial  PRN  . insulin glargine (LANTUS) 100 UNIT/ML injection Inject 45 Units into the skin at bedtime.      Marland Kitchen lisinopril (PRINIVIL,ZESTRIL) 20 MG tablet Take 1 tablet (20 mg total) by mouth daily.      . metFORMIN (GLUCOPHAGE) 1000 MG tablet Take 1 tablet (1,000 mg total) by mouth 2 (two) times daily.  60 tablet  5  . methocarbamol (ROBAXIN) 500 MG tablet Take 500 mg by mouth every 6 (six) hours. Taking scheduled - every 6 hours while awake      . Multiple Vitamin (MULTIVITAMIN  WITH MINERALS) TABS Take 1 tablet by mouth daily.      Marland Kitchen oxyCODONE-acetaminophen (PERCOCET/ROXICET) 5-325 MG per tablet Take 1 tablet by mouth every 4 (four) hours as needed for pain.      . pantoprazole (PROTONIX) 20 MG tablet Take 20 mg by mouth daily.      . potassium chloride SA (K-DUR,KLOR-CON) 20 MEQ tablet Take 20 mEq by mouth daily.      . pregabalin (LYRICA) 75 MG capsule Take 75 mg by mouth 2 (two) times daily.      . Pyridoxine HCl (VITAMIN B-6) 500 MG tablet Take 500 mg by mouth daily.      Marland Kitchen rOPINIRole (REQUIP) 2 MG tablet Take 2 mg by mouth at bedtime.       . traZODone (DESYREL) 50 MG tablet Take 100 mg by mouth at bedtime. For sleep.      Marland Kitchen zolpidem (AMBIEN) 10 MG tablet Take 10 mg by mouth at bedtime. For sleep.      . [DISCONTINUED] simvastatin (ZOCOR) 40 MG tablet Take 40 mg by mouth every evening.       No current facility-administered medications for this visit.    No  Known Allergies   Past Medical History:  1. Hypertension  2. Depression  3. DM  4. Obesity  5. GERD  6. CAD: Unstable angina 2/08. PCI to distal codominant CFX with 2.5 x 20 Taxus DES. Lexiscan myoview (3/12): chest pain and dyspnea with stress, EF 62%, small fixed lateral perfusion defect with significant surrounding reversible perfusion defect suggestive of significant ischemia, ischemic ECG response. LHC (4/12): 80-90% mid and distal CFX stenosis before and after prior-placed stent, total occlusion of a moderate 1st OM. Patient had DES x 2 to CFX. Lexiscan myoview (3/13) with EF 68%, moderate partially reversible lateral defect c/w prior MI/peri-infarct ischemia. Defect less marked than on the prior study.  7. Fibromyalgia  8. Diastolic CHF: Echo (3/12) with EF 55-60%, mild LV hypertrophy, mild aortic stenosis (mean gradient 13 mmHg), mild MR, normal RV size and systolic function, PA systolic pressure 55 mmHg. Echo (2/13): EF 60%, mild LVH, moderate diastolic dysfunction, mild AI, aortic sclerosis, mild MR, mild RV dilation and dysfunction, PASP 61 mmHg.  9. Aortic stenosis: Minimal by most recent echo.  10. Diabetic retinopathy  11. H/o vertigo  12. Peripheral neuropathy likely from diabetes.  13. OHS/OSA: Severe OSA on sleep study. Patient is off CPAP currently b/c she could not tolerate the mask  Past Medical History  Diagnosis Date  . Hypertension   . Depression   . Diabetes mellitus   . GERD (gastroesophageal reflux disease)   . Obesity   . Coronary artery disease 08/2006    Unstable angina. PCI to distal codominant CFX with 2.5 x 20 Taxus DES...  . CHF (congestive heart failure)   . Sleep apnea     stopped cpap  . Arthritis   . ZOXWRUEA(540.9)     Past Surgical History  Procedure Laterality Date  . Coronary stent placement  08/2006    CAD; stent placed @ [facility]  . Total abdominal hysterectomy    . Cardiac catheterization      x2            mcclean  . Lumbar  fusion  02/02/2013    History  Smoking status  . Never Smoker   Smokeless tobacco  . Never Used    History  Alcohol Use No    Family History  Problem Relation  Age of Onset  . Heart attack Father   . Heart failure Father   . Anemia Mother   . Hypertension Mother     Review of Systems: The review of systems is per the HPI.  All other systems were reviewed and are negative.  Physical Exam: BP 140/62  Pulse 70  Ht 5\' 6"  (1.676 m)  Wt 232 lb 12.8 oz (105.597 kg)  BMI 37.59 kg/m2  SpO2 91% Patient is very pleasant and in no acute distress. Still looks chronically ill. Skin is warm and dry. Color is normal.  HEENT is unremarkable. Normocephalic/atraumatic. PERRL. Sclera are nonicteric. Neck is supple. No masses. No JVD. Lungs are clear. Oxygen sat by me is 91% on RA. Cardiac exam shows a regular rate and rhythm. Abdomen is soft. Extremities are without edema. Gait is not tested. No gross neurologic deficits noted.  LABORATORY DATA:  Lab Results  Component Value Date   WBC 9.7 02/02/2013   HGB 8.8* 02/02/2013   HCT 28.0* 02/02/2013   PLT 220 02/02/2013   GLUCOSE 223* 02/02/2013   CHOL 131 10/05/2011   TRIG 335.0* 10/05/2011   HDL 33.50* 10/05/2011   LDLDIRECT 54.5 10/05/2011   LDLCALC 50 01/21/2011   ALT 9 08/30/2011   AST 16 08/30/2011   NA 137 02/02/2013   K 4.4 02/02/2013   CL 99 02/02/2013   CREATININE 1.27* 02/02/2013   BUN 19 02/02/2013   CO2 29 02/02/2013   TSH 3.456 08/31/2011   INR 1.0 10/21/2010   HGBA1C 9.1* 08/29/2011     Assessment / Plan: 1. CAD - no symptoms reported. Doing well clinically.   2. Anemia - says she has had follow up labs with her PCP.  3. HLD - on statin therapy.   4. Uncontrolled DM - recent A1C down to 7.  5. Diastolic HF - looks compensated.   [name] not hypoxic. Has not used her oxygen in over a year. Have sent in order to have discontinued and picked up. Get her back to see Dr. Shirlee Latch in 4 months. She has actually made pretty good progress  over the summer since her surgery.   Patient is agreeable to this plan and will call if any problems develop in the interim.   Rosalio Macadamia, RN, ANP-C [facility] [address] [place], [place]  [address]

## 2013-04-06 ENCOUNTER — Other Ambulatory Visit: Payer: Self-pay | Admitting: Adult Health

## 2013-04-08 NOTE — Progress Notes (Signed)
Patient ID: Krista Wise, female   DOB: 05/05/1946, 67 y.o.   MRN: [id number]  [facility]  No Known Allergies  Chief Complaint  Patient presents with  . Discharge Note    HPI  [name] being discharge to home with home health for pt/ot/nursing have being hospitalized for lumbar fusion. She was admitted to this facility for short term rehab.  She will not need any dme; she will need prescriptions to be written.   Past Medical History  Diagnosis Date  . Hypertension   . Depression   . Diabetes mellitus   . GERD (gastroesophageal reflux disease)   . Obesity   . Coronary artery disease 08/2006    Unstable angina. PCI to distal codominant CFX with 2.5 x 20 Taxus DES...  . CHF (congestive heart failure)   . Sleep apnea     stopped cpap  . Arthritis   . WNUUVOZD(664.4)     Past Surgical History  Procedure Laterality Date  . Coronary stent placement  08/2006    CAD; stent placed @ [facility]  . Total abdominal hysterectomy    . Cardiac catheterization      x2            mcclean  . Lumbar fusion  02/02/2013    Current Outpatient Prescriptions on File Prior to Visit  Medication Sig Dispense Refill  . atorvastatin (LIPITOR) 40 MG tablet Take 40 mg by mouth at bedtime.       . bisoprolol (ZEBETA) 5 MG tablet Take 5 mg by mouth daily.      Marland Kitchen buPROPion (WELLBUTRIN SR) 150 MG 12 hr tablet Take 150 mg by mouth 2 (two) times daily.      . Cholecalciferol (VITAMIN D3) 1000 UNITS CAPS Take 1,000 Units by mouth daily.      . DULoxetine (CYMBALTA) 60 MG capsule Take 60 mg by mouth daily.      . ferrous sulfate 325 (65 FE) MG tablet Take 1 tablet (325 mg total) by mouth 2 (two) times daily.  60 tablet  3  . furosemide (LASIX) 20 MG tablet Take 40 mg by mouth 2 (two) times daily.       . insulin aspart (NOVOLOG) 100 UNIT/ML injection Inject 5 Units into the skin 3 (three) times daily before meals. For cbg >=150  3 vial  PRN  . insulin glargine (LANTUS) 100 UNIT/ML injection Inject 45  Units into the skin at bedtime.      Marland Kitchen lisinopril (PRINIVIL,ZESTRIL) 20 MG tablet Take 1 tablet (20 mg total) by mouth daily.      . metFORMIN (GLUCOPHAGE) 1000 MG tablet Take 1 tablet (1,000 mg total) by mouth 2 (two) times daily.  60 tablet  5  . methocarbamol (ROBAXIN) 500 MG tablet Take 500 mg by mouth every 6 (six) hours. Taking scheduled - every 6 hours while awake      . Multiple Vitamin (MULTIVITAMIN WITH MINERALS) TABS Take 1 tablet by mouth daily.      Marland Kitchen oxyCODONE-acetaminophen (PERCOCET/ROXICET) 5-325 MG per tablet Take 1 tablet by mouth every 4 (four) hours as needed for pain.      . pantoprazole (PROTONIX) 20 MG tablet Take 20 mg by mouth daily.      . potassium chloride SA (K-DUR,KLOR-CON) 20 MEQ tablet Take 20 mEq by mouth daily.      . pregabalin (LYRICA) 75 MG capsule Take 75 mg by mouth 2 (two) times daily.      . Pyridoxine  HCl (VITAMIN B-6) 500 MG tablet Take 500 mg by mouth daily.      Marland Kitchen rOPINIRole (REQUIP) 2 MG tablet Take 2 mg by mouth at bedtime.       . traZODone (DESYREL) 50 MG tablet Take 100 mg by mouth at bedtime. For sleep.      Marland Kitchen zolpidem (AMBIEN) 10 MG tablet Take 10 mg by mouth at bedtime. For sleep.      . [DISCONTINUED] simvastatin (ZOCOR) 40 MG tablet Take 40 mg by mouth every evening.       No current facility-administered medications on file prior to visit.    Filed Vitals:   03/08/13 1537  BP: 127/69  Pulse: 80  Weight: 238 lb (107.956 kg)    SIGNIFICANT DIAGNOSTIC EXAMS   02-02-13: wbc 9.7; hgb 8.8; hct 28; mcv 84.1 plt 220  02-11-13: wbc 13.7; hgb 7.7; hct 25.7 ;mcv 88.6 plt 356; glucose 164; bun 23; creat 1.2; k+ 4.2;na++137 Liver normal albumin 2.8; chol 94; ldl 52; trig 138; vit d 24.5; vit b6: 11.0 hgb a1c 11.1  02-12-13: wbc 11.2; hgb 7.5; hct 25.7; mcv 88.4 plt 347; glucose 82; bun 21; creat 1.1; k+ 3.5;na++139 Liver normal albumin 2.7  02-19-13: wbc 6.1; hgb 8.4; hct 30.8;mcv 89.8; plt 338     Review of Systems  Constitutional:  Negative for fever and malaise/fatigue.  Eyes: Negative for blurred vision.  Respiratory: Negative for cough and shortness of breath.   Cardiovascular: Negative for chest pain and palpitations.  Gastrointestinal: Negative for heartburn, diarrhea and blood in stool.  Musculoskeletal: has back pain.   Skin: Negative.   Neurological: Negative for dizziness, weakness and headaches.  Psychiatric/Behavioral: The patient does not have insomnia.     Physical Exam  Constitutional: [name] oriented to person, place, and time. She appears well-developed and well-nourished.  overweight  Neck: Normal range of motion. Neck supple. No JVD present. No thyromegaly present.  Cardiovascular: Normal rate, regular rhythm and intact distal pulses.   Respiratory: Effort normal and breath sounds normal. No respiratory distress.  GI: Soft. Bowel sounds are normal. She exhibits no distension and no mass. There is no tenderness.  Musculoskeletal: Normal range of motion. She exhibits no edema.  Neurological: [name] alert and oriented to person, place, and time.  Skin: Skin is warm and dry.     ASSESSMENT/PLAN  Will discharge her to home with home health for pt/ot/nursing; will not need dme; her prescriptions have been written  Time spent with patient: 35 minutes.

## 2013-04-13 ENCOUNTER — Other Ambulatory Visit: Payer: Self-pay | Admitting: Adult Health

## 2013-04-21 ENCOUNTER — Other Ambulatory Visit: Payer: Self-pay | Admitting: Adult Health

## 2013-04-29 ENCOUNTER — Other Ambulatory Visit: Payer: Self-pay | Admitting: Cardiology

## 2013-05-13 ENCOUNTER — Other Ambulatory Visit: Payer: Self-pay | Admitting: Adult Health

## 2013-05-15 ENCOUNTER — Telehealth: Payer: Self-pay | Admitting: Cardiology

## 2013-05-15 ENCOUNTER — Telehealth: Payer: Self-pay | Admitting: *Deleted

## 2013-05-15 NOTE — Telephone Encounter (Signed)
S/w melissa from [facility] is going to pick up pt's oxygen, I stated I hope pt is going to not have to pay for 40 days of having tank when the order was sent in on 04/05/13 Melissa stated would look into it. I agreed verbally to call pt back to let them know tank would be getting picked up lmovm

## 2013-05-15 NOTE — Telephone Encounter (Signed)
Katina Dung, RN s/w me today in regards to this pt Krista Wise D/C [facility] oxygen [facility] according to last ov note on 04/05/2013 and [facility] did not pick up oxygen tank, I s/w with Melissa from [facility] @ [phone] and will get back to me about the status for this pt.

## 2013-05-15 NOTE — Telephone Encounter (Signed)
[email or web address]

## 2013-05-15 NOTE — Telephone Encounter (Signed)
Follow up    Please advise about previous message

## 2013-05-15 NOTE — Telephone Encounter (Signed)
New problem   Pt said that Dr Shirlee Latch told her she no longer needed Oxygen [facility] stated in order for them to discontinue this they an order fax to [phone]

## 2013-07-06 ENCOUNTER — Encounter (HOSPITAL_COMMUNITY): Payer: Self-pay | Admitting: Emergency Medicine

## 2013-07-06 ENCOUNTER — Inpatient Hospital Stay (HOSPITAL_COMMUNITY)
Admission: EM | Admit: 2013-07-06 | Discharge: 2013-07-08 | DRG: 684 | Disposition: A | Discharge status: Home / Self Care | Payer: Medicare Other | Attending: Internal Medicine | Admitting: Internal Medicine

## 2013-07-06 ENCOUNTER — Emergency Department (HOSPITAL_COMMUNITY): Payer: Medicare Other

## 2013-07-06 DIAGNOSIS — Z7982 Long term (current) use of aspirin: Secondary | ICD-10-CM

## 2013-07-06 DIAGNOSIS — N179 Acute kidney failure, unspecified: Principal | ICD-10-CM

## 2013-07-06 DIAGNOSIS — K219 Gastro-esophageal reflux disease without esophagitis: Secondary | ICD-10-CM | POA: Diagnosis present

## 2013-07-06 DIAGNOSIS — I2510 Atherosclerotic heart disease of native coronary artery without angina pectoris: Secondary | ICD-10-CM | POA: Diagnosis present

## 2013-07-06 DIAGNOSIS — I509 Heart failure, unspecified: Secondary | ICD-10-CM | POA: Diagnosis present

## 2013-07-06 DIAGNOSIS — Z79899 Other long term (current) drug therapy: Secondary | ICD-10-CM

## 2013-07-06 DIAGNOSIS — Z794 Long term (current) use of insulin: Secondary | ICD-10-CM

## 2013-07-06 DIAGNOSIS — R112 Nausea with vomiting, unspecified: Secondary | ICD-10-CM

## 2013-07-06 DIAGNOSIS — I10 Essential (primary) hypertension: Secondary | ICD-10-CM | POA: Diagnosis present

## 2013-07-06 DIAGNOSIS — E118 Type 2 diabetes mellitus with unspecified complications: Secondary | ICD-10-CM

## 2013-07-06 DIAGNOSIS — E669 Obesity, unspecified: Secondary | ICD-10-CM | POA: Diagnosis present

## 2013-07-06 DIAGNOSIS — E860 Dehydration: Secondary | ICD-10-CM | POA: Diagnosis present

## 2013-07-06 LAB — CBC WITH DIFFERENTIAL/PLATELET
Eosinophils Absolute: 0.3 10*3/uL (ref 0.0–0.7)
Eosinophils Relative: 3 % (ref 0–5)
HCT: 44.6 % (ref 36.0–46.0)
Lymphocytes Relative: 14 % (ref 12–46)
Lymphs Abs: 1.3 10*3/uL (ref 0.7–4.0)
MCH: 27 pg (ref 26.0–34.0)
MCV: 81.4 fL (ref 78.0–100.0)
Monocytes Absolute: 0.7 10*3/uL (ref 0.1–1.0)
Platelets: 254 10*3/uL (ref 150–400)
RBC: 5.48 MIL/uL — ABNORMAL HIGH (ref 3.87–5.11)
RDW: 17 % — ABNORMAL HIGH (ref 11.5–15.5)
WBC: 8.9 10*3/uL (ref 4.0–10.5)

## 2013-07-06 LAB — URINALYSIS, ROUTINE W REFLEX MICROSCOPIC
Hgb urine dipstick: NEGATIVE
Nitrite: NEGATIVE
Protein, ur: NEGATIVE mg/dL
Specific Gravity, Urine: 1.014 (ref 1.005–1.030)
Urobilinogen, UA: 0.2 mg/dL (ref 0.0–1.0)

## 2013-07-06 LAB — POCT I-STAT TROPONIN I: Troponin i, poc: 0.06 ng/mL (ref 0.00–0.08)

## 2013-07-06 LAB — COMPREHENSIVE METABOLIC PANEL
ALT: 12 U/L (ref 0–35)
CO2: 29 mEq/L (ref 19–32)
Calcium: 9.6 mg/dL (ref 8.4–10.5)
Creatinine, Ser: 3.33 mg/dL — ABNORMAL HIGH (ref 0.50–1.10)
GFR calc Af Amer: 16 mL/min — ABNORMAL LOW (ref >90)
GFR calc non Af Amer: 14 mL/min — ABNORMAL LOW (ref >90)
Glucose, Bld: 92 mg/dL (ref 70–99)
Sodium: 140 mEq/L (ref 135–145)
Total Protein: 7.7 g/dL (ref 6.0–8.3)

## 2013-07-06 LAB — URINE MICROSCOPIC-ADD ON

## 2013-07-06 LAB — LIPASE, BLOOD: Lipase: 73 U/L — ABNORMAL HIGH (ref 11–59)

## 2013-07-06 MED ORDER — DULOXETINE HCL 60 MG PO CPEP
60.0000 mg | ORAL_CAPSULE | Freq: Every day | ORAL | Status: DC
  Administered 2013-07-07 – 2013-07-08 (×2): 60 mg via ORAL
  Filled 2013-07-06 (×2): qty 1

## 2013-07-06 MED ORDER — FERROUS SULFATE 325 (65 FE) MG PO TABS
325.0000 mg | ORAL_TABLET | Freq: Two times a day (BID) | ORAL | Status: DC
  Administered 2013-07-06 – 2013-07-08 (×4): 325 mg via ORAL
  Filled 2013-07-06 (×5): qty 1

## 2013-07-06 MED ORDER — INSULIN ASPART 100 UNIT/ML ~~LOC~~ SOLN: 0.0000 [IU] | Freq: Three times a day (TID) | SUBCUTANEOUS | Status: DC

## 2013-07-06 MED ORDER — BUPROPION HCL ER (SR) 150 MG PO TB12
150.0000 mg | ORAL_TABLET | Freq: Two times a day (BID) | ORAL | Status: DC
  Administered 2013-07-06 – 2013-07-08 (×4): 150 mg via ORAL
  Filled 2013-07-06 (×5): qty 1

## 2013-07-06 MED ORDER — ROPINIROLE HCL 1 MG PO TABS
2.0000 mg | ORAL_TABLET | Freq: Every day | ORAL | Status: DC
  Administered 2013-07-06 – 2013-07-07 (×2): 2 mg via ORAL
  Filled 2013-07-06 (×3): qty 2

## 2013-07-06 MED ORDER — PANTOPRAZOLE SODIUM 20 MG PO TBEC
20.0000 mg | DELAYED_RELEASE_TABLET | Freq: Every day | ORAL | Status: DC
  Administered 2013-07-07 – 2013-07-08 (×2): 20 mg via ORAL
  Filled 2013-07-06 (×2): qty 1

## 2013-07-06 MED ORDER — VITAMIN D3 25 MCG (1000 UNIT) PO TABS
1000.0000 [IU] | ORAL_TABLET | Freq: Every day | ORAL | Status: DC
  Administered 2013-07-07 – 2013-07-08 (×2): 1000 [IU] via ORAL
  Filled 2013-07-06 (×2): qty 1

## 2013-07-06 MED ORDER — ZOLPIDEM TARTRATE 5 MG PO TABS
10.0000 mg | ORAL_TABLET | Freq: Every day | ORAL | Status: DC
  Administered 2013-07-07: 10 mg via ORAL
  Filled 2013-07-06 (×2): qty 2

## 2013-07-06 MED ORDER — SODIUM CHLORIDE 0.9 % IV SOLN
INTRAVENOUS | Status: DC
  Administered 2013-07-06 – 2013-07-08 (×3): via INTRAVENOUS

## 2013-07-06 MED ORDER — HEPARIN SODIUM (PORCINE) 5000 UNIT/ML IJ SOLN
5000.0000 [IU] | Freq: Three times a day (TID) | INTRAMUSCULAR | Status: DC
  Administered 2013-07-06 – 2013-07-08 (×6): 5000 [IU] via SUBCUTANEOUS
  Filled 2013-07-06 (×8): qty 1

## 2013-07-06 MED ORDER — PREGABALIN 50 MG PO CAPS
75.0000 mg | ORAL_CAPSULE | Freq: Two times a day (BID) | ORAL | Status: DC
  Filled 2013-07-06 (×6): qty 1

## 2013-07-06 MED ORDER — TRAZODONE HCL 100 MG PO TABS
100.0000 mg | ORAL_TABLET | Freq: Every day | ORAL | Status: DC
  Administered 2013-07-06 – 2013-07-07 (×2): 100 mg via ORAL
  Filled 2013-07-06 (×3): qty 1

## 2013-07-06 MED ORDER — VITAMIN B-6 100 MG PO TABS
500.0000 mg | ORAL_TABLET | Freq: Every day | ORAL | Status: DC
Start: 2013-07-07 — End: 2013-07-08
  Administered 2013-07-07 – 2013-07-08 (×2): 500 mg via ORAL
  Filled 2013-07-06 (×2): qty 5

## 2013-07-06 MED ORDER — VITAMIN D3 25 MCG (1000 UT) PO CAPS: 1000.0000 [IU] | ORAL_CAPSULE | Freq: Every day | ORAL | Status: DC

## 2013-07-06 MED ORDER — ATORVASTATIN CALCIUM 40 MG PO TABS
40.0000 mg | ORAL_TABLET | Freq: Every day | ORAL | Status: DC
  Administered 2013-07-07: 40 mg via ORAL
  Filled 2013-07-06 (×3): qty 1

## 2013-07-06 MED ORDER — ASPIRIN EC 81 MG PO TBEC
81.0000 mg | DELAYED_RELEASE_TABLET | Freq: Every day | ORAL | Status: DC
  Administered 2013-07-07 – 2013-07-08 (×2): 81 mg via ORAL
  Filled 2013-07-06 (×2): qty 1

## 2013-07-06 MED ORDER — BISOPROLOL FUMARATE 5 MG PO TABS
5.0000 mg | ORAL_TABLET | Freq: Every day | ORAL | Status: DC
  Administered 2013-07-06 – 2013-07-08 (×3): 5 mg via ORAL
  Filled 2013-07-06 (×3): qty 1

## 2013-07-06 MED ORDER — ADULT MULTIVITAMIN W/MINERALS CH
1.0000 | ORAL_TABLET | Freq: Every day | ORAL | Status: DC
  Administered 2013-07-07 – 2013-07-08 (×2): 1 via ORAL
  Filled 2013-07-06 (×2): qty 1

## 2013-07-06 MED ORDER — METHOCARBAMOL 500 MG PO TABS
500.0000 mg | ORAL_TABLET | Freq: Four times a day (QID) | ORAL | Status: DC
  Administered 2013-07-07 – 2013-07-08 (×6): 500 mg via ORAL
  Filled 2013-07-06 (×12): qty 1

## 2013-07-06 MED ORDER — OXYCODONE-ACETAMINOPHEN 5-325 MG PO TABS: 1.0000 | ORAL_TABLET | ORAL | Status: DC | PRN

## 2013-07-06 MED ORDER — INSULIN GLARGINE 100 UNIT/ML ~~LOC~~ SOLN
20.0000 [IU] | Freq: Every day | SUBCUTANEOUS | Status: DC
  Administered 2013-07-06 – 2013-07-07 (×2): 20 [IU] via SUBCUTANEOUS
  Filled 2013-07-06 (×3): qty 0.2

## 2013-07-06 MED ORDER — SODIUM CHLORIDE 0.9 % IV BOLUS (SEPSIS)
1000.0000 mL | Freq: Once | INTRAVENOUS | Status: AC
  Administered 2013-07-06: 1000 mL via INTRAVENOUS

## 2013-07-06 NOTE — ED Notes (Addendum)
Pt from via [facility] c/o weakness and nausea/vomiting x1 week.  Pt denies nausea at this time.  Pt reports no unrination since yesterday.  Pt in NAD, A&O.

## 2013-07-06 NOTE — ED Provider Notes (Signed)
CSN: [id number]     Arrival date & time 07/06/13  1601 History   First MD Initiated Contact with Patient 07/06/13 1804     Chief Complaint  Patient presents with  . Weakness  . Nausea  . Emesis   (Consider location/radiation/quality/duration/timing/severity/associated sxs/prior Treatment) HPI Comments: Patient is a 67 year old female with history of hypertension, depression, diabetes, CAD, CHF who presents today who 1 week of gradually worsening fatigue and weakness. She has had associated vomiting and abdominal pain. She has not been able to keep fluids or food down. She has not produced urine since yesterday. She reports that this feels like the time she has previously been in renal failure. No fevers, chills, shortness of breath, chest pain.   The history is provided by the patient. No language interpreter was used.    Past Medical History  Diagnosis Date  . Hypertension   . Depression   . Diabetes mellitus   . GERD (gastroesophageal reflux disease)   . Obesity   . Coronary artery disease 08/2006    Unstable angina. PCI to distal codominant CFX with 2.5 x 20 Taxus DES...  . CHF (congestive heart failure)   . Sleep apnea     stopped cpap  . Arthritis   . NWGNFAOZ(308.6)    Past Surgical History  Procedure Laterality Date  . Coronary stent placement  08/2006    CAD; stent placed @ [facility]  . Total abdominal hysterectomy    . Cardiac catheterization      x2            mcclean  . Lumbar fusion  02/02/2013  . Back surgery     Family History  Problem Relation Age of Onset  . Heart attack Father   . Heart failure Father   . Anemia Mother   . Hypertension Mother    History  Substance Use Topics  . Smoking status: Never Smoker   . Smokeless tobacco: Never Used  . Alcohol Use: No   OB History   Grav Para Term Preterm Abortions TAB SAB Ect Mult Living                 Review of Systems  Constitutional: Positive for fatigue. Negative for fever and chills.   Respiratory: Negative for shortness of breath.   Cardiovascular: Negative for chest pain.  Gastrointestinal: Positive for nausea, vomiting and abdominal pain. Negative for diarrhea.  Neurological: Positive for weakness (generalized).  All other systems reviewed and are negative.    Allergies  Review of patient's allergies indicates no known allergies.  Home Medications   Current Outpatient Rx  Name  Route  Sig  Dispense  Refill  . aspirin EC 81 MG tablet   Oral   Take 1 tablet (81 mg total) by mouth daily.         Marland Kitchen atorvastatin (LIPITOR) 40 MG tablet   Oral   Take 40 mg by mouth at bedtime.          . bisoprolol (ZEBETA) 5 MG tablet   Oral   Take 5 mg by mouth daily.         Marland Kitchen buPROPion (WELLBUTRIN SR) 150 MG 12 hr tablet   Oral   Take 150 mg by mouth 2 (two) times daily.         . Cholecalciferol (VITAMIN D3) 1000 UNITS CAPS   Oral   Take 1,000 Units by mouth daily.         Marland Kitchen  DULoxetine (CYMBALTA) 60 MG capsule   Oral   Take 60 mg by mouth daily.         . ferrous sulfate 325 (65 FE) MG tablet   Oral   Take 1 tablet (325 mg total) by mouth 2 (two) times daily.   60 tablet   3   . furosemide (LASIX) 20 MG tablet   Oral   Take 40 mg by mouth 2 (two) times daily.          . insulin aspart (NOVOLOG) 100 UNIT/ML injection   Subcutaneous   Inject 5 Units into the skin 3 (three) times daily before meals. For cbg >=150   3 vial   PRN   . insulin glargine (LANTUS) 100 UNIT/ML injection   Subcutaneous   Inject 45 Units into the skin at bedtime.         Marland Kitchen lisinopril (PRINIVIL,ZESTRIL) 20 MG tablet   Oral   Take 1 tablet (20 mg total) by mouth daily.         . metFORMIN (GLUCOPHAGE) 1000 MG tablet      TAKE ONE TABLET BY MOUTH TWICE DAILY   60 tablet   6   . methocarbamol (ROBAXIN) 500 MG tablet   Oral   Take 500 mg by mouth every 6 (six) hours. Taking scheduled - every 6 hours while awake         . Multiple Vitamin (MULTIVITAMIN  WITH MINERALS) TABS   Oral   Take 1 tablet by mouth daily.         Marland Kitchen oxyCODONE-acetaminophen (PERCOCET/ROXICET) 5-325 MG per tablet   Oral   Take 1 tablet by mouth every 4 (four) hours as needed for pain.         . pantoprazole (PROTONIX) 20 MG tablet   Oral   Take 20 mg by mouth daily.         . potassium chloride SA (K-DUR,KLOR-CON) 20 MEQ tablet   Oral   Take 20 mEq by mouth daily.         . pregabalin (LYRICA) 75 MG capsule   Oral   Take 75 mg by mouth 2 (two) times daily.         . Pyridoxine HCl (VITAMIN B-6) 500 MG tablet   Oral   Take 500 mg by mouth daily.         Marland Kitchen rOPINIRole (REQUIP) 2 MG tablet   Oral   Take 2 mg by mouth at bedtime.          . traZODone (DESYREL) 50 MG tablet   Oral   Take 100 mg by mouth at bedtime. For sleep.         Marland Kitchen zolpidem (AMBIEN) 10 MG tablet   Oral   Take 10 mg by mouth at bedtime. For sleep.          BP 100/51  Pulse 70  Temp(Src) 97.7 F (36.5 C) (Oral)  Resp 13  SpO2 86% Physical Exam  Nursing note and vitals reviewed. Constitutional: [name] oriented to person, place, and time. She appears well-developed and well-nourished. [name] cooperative. No distress.  HENT:  Head: Normocephalic and atraumatic.  Right Ear: External ear normal.  Left Ear: External ear normal.  Nose: Nose normal.  Mouth/Throat: Oropharynx is clear and moist. Mucous membranes are dry.  Eyes: Conjunctivae are normal.  Neck: Normal range of motion.  Cardiovascular: Normal rate, regular rhythm, normal heart sounds, intact distal pulses and normal pulses.  Pulmonary/Chest: Effort normal and breath sounds normal. No stridor. No respiratory distress. She has no wheezes. She has no rales.  Abdominal: Soft. She exhibits no distension.  Musculoskeletal: Normal range of motion.  Neurological: [name] alert and oriented to person, place, and time. She has normal strength.  No focal deficit  Skin: Skin is warm and dry. [name] not  diaphoretic. No erythema.  Psychiatric: She has a normal mood and affect. Her behavior is normal.    ED Course  Procedures (including critical care time) Labs Review Labs Reviewed  CBC WITH DIFFERENTIAL - Abnormal; Notable for the following:    RBC 5.48 (*)    RDW 17.0 (*)    All other components within normal limits  COMPREHENSIVE METABOLIC PANEL - Abnormal; Notable for the following:    BUN 80 (*)    Creatinine, Ser 3.33 (*)    Total Bilirubin 0.2 (*)    GFR calc non Af Amer 14 (*)    GFR calc Af Amer 16 (*)    All other components within normal limits  LIPASE, BLOOD - Abnormal; Notable for the following:    Lipase 73 (*)    All other components within normal limits  GLUCOSE, CAPILLARY  URINALYSIS, ROUTINE W REFLEX MICROSCOPIC  POCT I-STAT TROPONIN I   Imaging Review Dg Chest 2 View  07/06/2013   CLINICAL DATA:  Weakness with nausea.  EXAM: CHEST  2 VIEW  COMPARISON:  01/21/2013.  FINDINGS: Cardiomegaly. Low lung volumes. Bibasilar opacities could represent early infiltrates or atelectasis. No effusion or pneumothorax. Bones unremarkable.  IMPRESSION: Cardiomegaly. Bibasilar opacities slightly worse from priors. Question subsegmental atelectasis versus early infiltrates.   Electronically Signed   By: Davonna Belling M.D.   On: 07/06/2013 20:07    EKG Interpretation   None       MDM   1. 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 Nausea & vomiting    Patient presents with AKI after nausea and vomiting x 1 week. The patient appears reasonably stabilized for admission considering the current resources, flow, and capabilities available in the ED at this time, and I doubt any other [facility] requiring further screening and/or treatment in the ED prior to admission. Admission is appreciated. Discussed case with Dr. Effie Shy who agrees with plan. Patient / Family / Caregiver informed of clinical course, understand medical decision-making process, and  agree with plan.     Mora Bellman, PA-C 07/07/13 [phone]

## 2013-07-06 NOTE — ED Notes (Signed)
Attempted to call report to Inpatient unit. RN currently not available requested call back in @ [phone].

## 2013-07-06 NOTE — H&P (Signed)
[facility] History and Physical  Krista Wise ZOX:[id number] DOB: 08/22/1945 DOA: 07/06/2013  Referring physician: EDP PCP: [name], NP   Chief Complaint: N/V, weakness   HPI: Krista Wise is a 67 y.o. female who presents to the ED with 1 week history of gradually worsening fatigue, weakness.  Associated with N/V and abd pain.  Not been able to keep fluids nor food down until today (first day she had no vomiting).  No UOP since yesterday.  She states she felt like this once before when she was in renal failure.  Review of Systems: Systems reviewed.  As above, otherwise negative  Past Medical History  Diagnosis Date  . Hypertension   . Depression   . Diabetes mellitus   . GERD (gastroesophageal reflux disease)   . Obesity   . Coronary artery disease 08/2006    Unstable angina. PCI to distal codominant CFX with 2.5 x 20 Taxus DES...  . CHF (congestive heart failure)   . Sleep apnea     stopped cpap  . Arthritis   . WJXBJYNW(295.6)    Past Surgical History  Procedure Laterality Date  . Coronary stent placement  08/2006    CAD; stent placed @ [facility]  . Total abdominal hysterectomy    . Cardiac catheterization      x2            mcclean  . Lumbar fusion  02/02/2013  . Back surgery     Social History:  reports that she has never smoked. She has never used smokeless tobacco. She reports that she does not drink alcohol or use illicit drugs.  No Known Allergies  Family History  Problem Relation Age of Onset  . Heart attack Father   . Heart failure Father   . Anemia Mother   . Hypertension Mother      Prior to Admission medications   Medication Sig Start Date End Date Taking? Authorizing Provider  aspirin EC 81 MG tablet Take 1 tablet (81 mg total) by mouth daily. 03/19/13  Yes Laurey Morale, MD  atorvastatin (LIPITOR) 40 MG tablet Take 40 mg by mouth at bedtime.     Historical Provider, MD  bisoprolol (ZEBETA) 5 MG tablet Take 5 mg by mouth daily.     Historical Provider, MD  buPROPion (WELLBUTRIN SR) 150 MG 12 hr tablet Take 150 mg by mouth 2 (two) times daily.    Historical Provider, MD  Cholecalciferol (VITAMIN D3) 1000 UNITS CAPS Take 1,000 Units by mouth daily.    Historical Provider, MD  DULoxetine (CYMBALTA) 60 MG capsule Take 60 mg by mouth daily.    Historical Provider, MD  ferrous sulfate 325 (65 FE) MG tablet Take 1 tablet (325 mg total) by mouth 2 (two) times daily. 02/12/13   Sharee Holster, NP  furosemide (LASIX) 20 MG tablet Take 40 mg by mouth 2 (two) times daily.  10/05/11   Laurey Morale, MD  insulin aspart (NOVOLOG) 100 UNIT/ML injection Inject 5 Units into the skin 3 (three) times daily before meals. For cbg >=150 02/15/13   Sharee Holster, NP  insulin glargine (LANTUS) 100 UNIT/ML injection Inject 45 Units into the skin at bedtime.    Historical Provider, MD  lisinopril (PRINIVIL,ZESTRIL) 20 MG tablet Take 1 tablet (20 mg total) by mouth daily. 10/05/11   Laurey Morale, MD  metFORMIN (GLUCOPHAGE) 1000 MG tablet TAKE ONE TABLET BY MOUTH TWICE DAILY 04/29/13   Laurey Morale, MD  methocarbamol (ROBAXIN) 500 MG tablet Take 500 mg by mouth every 6 (six) hours. Taking scheduled - every 6 hours while awake    Historical Provider, MD  Multiple Vitamin (MULTIVITAMIN WITH MINERALS) TABS Take 1 tablet by mouth daily.    Historical Provider, MD  oxyCODONE-acetaminophen (PERCOCET/ROXICET) 5-325 MG per tablet Take 1 tablet by mouth every 4 (four) hours as needed for pain.    Historical Provider, MD  pantoprazole (PROTONIX) 20 MG tablet Take 20 mg by mouth daily.    Historical Provider, MD  potassium chloride SA (K-DUR,KLOR-CON) 20 MEQ tablet Take 20 mEq by mouth daily.    Historical Provider, MD  pregabalin (LYRICA) 75 MG capsule Take 75 mg by mouth 2 (two) times daily.    Historical Provider, MD  Pyridoxine HCl (VITAMIN B-6) 500 MG tablet Take 500 mg by mouth daily.    Historical Provider, MD  rOPINIRole (REQUIP) 2 MG tablet Take 2  mg by mouth at bedtime.     Historical Provider, MD  traZODone (DESYREL) 50 MG tablet Take 100 mg by mouth at bedtime. For sleep.    Historical Provider, MD  zolpidem (AMBIEN) 10 MG tablet Take 10 mg by mouth at bedtime. For sleep.    Historical Provider, MD   Physical Exam: Filed Vitals:   07/06/13 2054  BP: 115/53  Pulse: 68  Temp:   Resp:     BP 115/53  Pulse 68  Temp(Src) 97.7 F (36.5 C) (Oral)  Resp 16  SpO2 100%  General Appearance:    Alert, oriented, no distress, appears stated age  Head:    Normocephalic, atraumatic  Eyes:    PERRL, EOMI, sclera non-icteric        Nose:   Nares without drainage or epistaxis. Mucosa, turbinates normal  Throat:   Moist mucous membranes. Oropharynx without erythema or exudate.  Neck:   Supple. No carotid bruits.  No thyromegaly.  No lymphadenopathy.   Back:     No CVA tenderness, no spinal tenderness  Lungs:     Clear to auscultation bilaterally, without wheezes, rhonchi or rales  Chest wall:    No tenderness to palpitation  Heart:    Regular rate and rhythm without murmurs, gallops, rubs  Abdomen:     Soft, non-tender, nondistended, normal bowel sounds, no organomegaly  Genitalia:    deferred  Rectal:    deferred  Extremities:   No clubbing, cyanosis or edema.  Pulses:   2+ and symmetric all extremities  Skin:   Skin color, texture, turgor normal, no rashes or lesions  Lymph nodes:   Cervical, supraclavicular, and axillary nodes normal  Neurologic:   CNII-XII intact. Normal strength, sensation and reflexes      throughout    Labs on Admission:  Basic Metabolic Panel:  Recent Labs Lab 07/06/13 1840  NA 140  K 5.0  CL 96  CO2 29  GLUCOSE 92  BUN 80*  CREATININE 3.33*  CALCIUM 9.6   Liver Function Tests:  Recent Labs Lab 07/06/13 1840  AST 17  ALT 12  ALKPHOS 71  BILITOT 0.2*  PROT 7.7  ALBUMIN 3.5    Recent Labs Lab 07/06/13 1840  LIPASE 73*   No results found for this basename: AMMONIA,  in the last  168 hours CBC:  Recent Labs Lab 07/06/13 1840  WBC 8.9  NEUTROABS 6.6  HGB 14.8  HCT 44.6  MCV 81.4  PLT 254   Cardiac Enzymes: No results found for this basename: CKTOTAL, CKMB,  CKMBINDEX, TROPONINI,  in the last 168 hours  BNP (last 3 results) No results found for this basename: PROBNP,  in the last 8760 hours CBG:  Recent Labs Lab 07/06/13 1615  GLUCAP 73    Radiological Exams on Admission: Dg Chest 2 View  07/06/2013   CLINICAL DATA:  Weakness with nausea.  EXAM: CHEST  2 VIEW  COMPARISON:  01/21/2013.  FINDINGS: Cardiomegaly. Low lung volumes. Bibasilar opacities could represent early infiltrates or atelectasis. No effusion or pneumothorax. Bones unremarkable.  IMPRESSION: Cardiomegaly. Bibasilar opacities slightly worse from priors. Question subsegmental atelectasis versus early infiltrates.   Electronically Signed   By: Davonna Belling M.D.   On: 07/06/2013 20:07    EKG: Independently reviewed.  Assessment/Plan Principal Problem:   AKI (acute kidney injury) Active Problems:   Diabetes mellitus type 2 with complications   Nausea & vomiting   1. AKI - Patient has correctly diagnosed herself with AKI, given history is almost certainly pre-renal due to dehydration, rehydrating with IVF, holding nephrotoxic ACEi and lasix.  Gentle hydration with IVF at 125 cc/hr (got 1L bolus in ED) due to h/o CHF in the past. 2. DM2 - holding metformin (in AKI), putting patient on half home dose of lantus + SSI. 3. N/V - resolved at this time and hasnt had symptoms in 24 hours now, unclear however what caused this to start in the first place, perhaps a viral gastroenteritis?    Code Status: Full  Family Communication: Family at bedside Disposition Plan: Admit to inpatient   Time spent: 70 min  Corrie Reder M. [facility] Pager [phone]  If 7AM-7PM, please contact the day team taking care of the patient [email or web address] Password [email or web address] 07/06/2013, 9:12 PM

## 2013-07-06 NOTE — ED Notes (Signed)
Patient transported to X-ray 

## 2013-07-07 DIAGNOSIS — I10 Essential (primary) hypertension: Secondary | ICD-10-CM

## 2013-07-07 LAB — BASIC METABOLIC PANEL
BUN: 71 mg/dL — ABNORMAL HIGH (ref 6–23)
Chloride: 103 mEq/L (ref 96–112)
Creatinine, Ser: 2.54 mg/dL — ABNORMAL HIGH (ref 0.50–1.10)
GFR calc Af Amer: 22 mL/min — ABNORMAL LOW (ref >90)
Sodium: 140 mEq/L (ref 135–145)

## 2013-07-07 LAB — GLUCOSE, CAPILLARY
Glucose-Capillary: 89 mg/dL (ref 70–99)
Glucose-Capillary: 113 mg/dL — ABNORMAL HIGH (ref 70–99)
Glucose-Capillary: 183 mg/dL — ABNORMAL HIGH (ref 70–99)

## 2013-07-07 LAB — CBC
HCT: 40.8 % (ref 36.0–46.0)
MCHC: 32.6 g/dL (ref 30.0–36.0)
MCV: 81.9 fL (ref 78.0–100.0)
Platelets: 209 10*3/uL (ref 150–400)
RDW: 17.4 % — ABNORMAL HIGH (ref 11.5–15.5)
WBC: 6.8 10*3/uL (ref 4.0–10.5)

## 2013-07-07 MED ORDER — PREGABALIN 50 MG PO CAPS
75.0000 mg | ORAL_CAPSULE | Freq: Two times a day (BID) | ORAL | Status: DC
  Administered 2013-07-07 – 2013-07-08 (×3): 75 mg via ORAL

## 2013-07-07 MED ORDER — INSULIN ASPART 100 UNIT/ML ~~LOC~~ SOLN
0.0000 [IU] | SUBCUTANEOUS | Status: DC
  Administered 2013-07-07: 3 [IU] via SUBCUTANEOUS
  Administered 2013-07-07: 2 [IU] via SUBCUTANEOUS
  Administered 2013-07-08: 3 [IU] via SUBCUTANEOUS
  Administered 2013-07-08 (×2): 2 [IU] via SUBCUTANEOUS

## 2013-07-07 NOTE — ED Provider Notes (Signed)
Medical screening examination/treatment/procedure(s) were performed by non-physician practitioner and as supervising physician I was immediately available for consultation/collaboration.  EKG Interpretation    Date/Time:  Saturday July 06 2013 16:16:40 EST Ventricular Rate:  69 PR Interval:  129 QRS Duration: 110 QT Interval:  422 QTC Calculation: 452 R Axis:   -49 Text Interpretation:  Sinus rhythm Left anterior fascicular block Abnormal R-wave progression, late transition Probable left ventricular hypertrophy since last tracing no significant change Confirmed by Effie Shy  MD, Boston Catarino ([id number]) on 07/06/2013 10:31:02 PM             Flint Melter, MD 07/07/13 1150

## 2013-07-07 NOTE — Progress Notes (Addendum)
[facility] PROGRESS NOTE  MEDINA DEGRAFFENREID ZOX:[id number] DOB: 09-06-1945 DOA: 07/06/2013 PCP: [name], NP Brief HPI: Krista Wise is a 67 y.o. female who presents to the ED with 1 week history of gradually worsening fatigue, weakness. Associated with N/V and abd pain. Not been able to keep fluids nor food down until today (first day she had no vomiting). No UOP since yesterday. She states she felt like this once before when she was in renal failure.  Assessment/Plan: 1. AKI - Patient has correctly diagnosed herself with AKI, given history is almost certainly pre-renal due to dehydration, rehydrating with IVF, holding nephrotoxic ACEi and lasix. Gentle hydration with IVF at 125 cc/hr (got 1L bolus in ED) due to h/o CHF in the past. Her repeat renal parameters showed improvement . US kidney ordered. UA ordered. And cultures pending.  2. DM2 - holding metformin (in AKI), putting patient on half home dose of lantus + SSI. hgba1c is pending.  CBG (last 3)   Recent Labs  07/07/13 0426 07/07/13 0729 07/07/13 1128  GLUCAP 92 89 113*     3. N/V - resolved at this time and hasnt had symptoms in 24 hours now, unclear however what caused this to start in the first place, perhaps a viral gastroenteritis?  4. DVT prophylaxis.   5. Hypertension: controlled.    Code Status: full code Family Communication: family at bedside, discussed with the family at bedside Disposition Plan: pending, possibly in 1 to 2 days.    Consultants:  none  Procedures:  US kidney pending  Antibiotics:  none  HPI/Subjective: Very dehydrated. She reports exhaustion. Tired. No pain.   Objective: Filed Vitals:   07/07/13 0429  BP: 124/59  Pulse: 57  Temp: 97.6 F (36.4 C)  Resp: 17    Intake/Output Summary (Last 24 hours) at 07/07/13 1542 Last data filed at 07/07/13 1500  Gross per 24 hour  Intake 2208.33 ml  Output    750 ml  Net 1458.33 ml   Filed Weights   07/06/13 2250   Weight: 98.657 kg (217 lb 8 oz)    Exam:   General:  Alert afebrile comfortable  Cardiovascular: s1s2  Respiratory: ctab  Abdomen: soft NT ND BS+  Musculoskeletal: no pedal edema.   Data Reviewed: Basic Metabolic Panel:  Recent Labs Lab 07/06/13 1840 07/07/13 0510  NA 140 140  K 5.0 4.1  CL 96 103  CO2 29 26  GLUCOSE 92 92  BUN 80* 71*  CREATININE 3.33* 2.54*  CALCIUM 9.6 8.8   Liver Function Tests:  Recent Labs Lab 07/06/13 1840  AST 17  ALT 12  ALKPHOS 71  BILITOT 0.2*  PROT 7.7  ALBUMIN 3.5    Recent Labs Lab 07/06/13 1840  LIPASE 73*   No results found for this basename: AMMONIA,  in the last 168 hours CBC:  Recent Labs Lab 07/06/13 1840 07/07/13 0510  WBC 8.9 6.8  NEUTROABS 6.6  --   HGB 14.8 13.3  HCT 44.6 40.8  MCV 81.4 81.9  PLT 254 209   Cardiac Enzymes: No results found for this basename: CKTOTAL, CKMB, CKMBINDEX, TROPONINI,  in the last 168 hours BNP (last 3 results) No results found for this basename: PROBNP,  in the last 8760 hours CBG:  Recent Labs Lab 07/06/13 2245 07/06/13 2355 07/07/13 0426 07/07/13 0729 07/07/13 1128  GLUCAP 95 100* 92 89 113*    No results found for this or any previous visit (from the past 240 hour(s)).  Studies: Dg Chest 2 View  07/06/2013   CLINICAL DATA:  Weakness with nausea.  EXAM: CHEST  2 VIEW  COMPARISON:  01/21/2013.  FINDINGS: Cardiomegaly. Low lung volumes. Bibasilar opacities could represent early infiltrates or atelectasis. No effusion or pneumothorax. Bones unremarkable.  IMPRESSION: Cardiomegaly. Bibasilar opacities slightly worse from priors. Question subsegmental atelectasis versus early infiltrates.   Electronically Signed   By: Davonna Belling M.D.   On: 07/06/2013 20:07    Scheduled Meds: . aspirin EC  81 mg Oral Daily  . atorvastatin  40 mg Oral QHS  . bisoprolol  5 mg Oral Daily  . buPROPion  150 mg Oral BID  . cholecalciferol  1,000 Units Oral Daily  . DULoxetine   60 mg Oral Daily  . ferrous sulfate  325 mg Oral BID  . heparin  5,000 Units Subcutaneous Q8H  . insulin aspart  0-15 Units Subcutaneous Q4H  . insulin glargine  20 Units Subcutaneous QHS  . methocarbamol  500 mg Oral Q6H  . multivitamin with minerals  1 tablet Oral Daily  . pantoprazole  20 mg Oral Daily  . pregabalin  75 mg Oral BID  . pregabalin  75 mg Oral BID  . vitamin B-6  500 mg Oral Daily  . rOPINIRole  2 mg Oral QHS  . traZODone  100 mg Oral QHS  . zolpidem  10 mg Oral QHS   Continuous Infusions: . sodium chloride 125 mL/hr at 07/07/13 1500    Principal Problem:   AKI (acute kidney injury) Active Problems:   Diabetes mellitus type 2 with complications   Nausea & vomiting    Time spent: 25 minutes    Vickki Igou  [facility] Pager [phone]. If 7PM-7AM, please contact night-coverage at [email or web address], password [facility] 07/07/2013, 3:42 PM  LOS: 1 day

## 2013-07-08 ENCOUNTER — Observation Stay (HOSPITAL_COMMUNITY): Payer: Medicare Other

## 2013-07-08 LAB — CBC
HCT: 40.6 % (ref 36.0–46.0)
Hemoglobin: 12.9 g/dL (ref 12.0–15.0)
MCH: 26.1 pg (ref 26.0–34.0)
MCHC: 31.8 g/dL (ref 30.0–36.0)
MCV: 82.2 fL (ref 78.0–100.0)
Platelets: 189 10*3/uL (ref 150–400)
RBC: 4.94 MIL/uL (ref 3.87–5.11)

## 2013-07-08 LAB — HEMOGLOBIN A1C: Hgb A1c MFr Bld: 8.1 % — ABNORMAL HIGH (ref <5.7)

## 2013-07-08 LAB — BASIC METABOLIC PANEL
CO2: 24 mEq/L (ref 19–32)
Calcium: 8.9 mg/dL (ref 8.4–10.5)
Creatinine, Ser: 1.62 mg/dL — ABNORMAL HIGH (ref 0.50–1.10)
GFR calc non Af Amer: 32 mL/min — ABNORMAL LOW (ref >90)
Glucose, Bld: 146 mg/dL — ABNORMAL HIGH (ref 70–99)
Sodium: 143 mEq/L (ref 135–145)

## 2013-07-08 LAB — GLUCOSE, CAPILLARY
Glucose-Capillary: 131 mg/dL — ABNORMAL HIGH (ref 70–99)
Glucose-Capillary: 191 mg/dL — ABNORMAL HIGH (ref 70–99)

## 2013-07-08 MED ORDER — AMLODIPINE BESYLATE 2.5 MG PO TABS
2.5000 mg | ORAL_TABLET | Freq: Every day | ORAL | Status: DC
  Administered 2013-07-08: 2.5 mg via ORAL
  Filled 2013-07-08: qty 1

## 2013-07-08 MED ORDER — GLUCERNA SHAKE PO LIQD: 237.0000 mL | ORAL | Status: DC

## 2013-07-08 MED ORDER — AMLODIPINE BESYLATE 2.5 MG PO TABS: 2.5000 mg | ORAL_TABLET | Freq: Every day | ORAL | Status: DC

## 2013-07-08 MED ORDER — GLUCERNA SHAKE PO LIQD
237.0000 mL | ORAL | Status: DC
  Administered 2013-07-08: 237 mL via ORAL

## 2013-07-08 NOTE — Progress Notes (Signed)
INITIAL NUTRITION ASSESSMENT  DOCUMENTATION CODES Per approved criteria  -Obesity Unspecified   INTERVENTION: Add Glucerna Shake po daily, each supplement provides 220 kcal and 10 grams of protein. RD to continue to follow nutrition care plan.  NUTRITION DIAGNOSIS: Inadequate oral intake related to variable appetite as evidenced by pt report.   Goal: Intake to meet >90% of estimated nutrition needs.  Monitor:  weight trends, lab trends, I/O's, PO intake, supplement tolerance  Reason for Assessment: Malnutrition Screening Tool  67 y.o. female  Admitting Dx: AKI (acute kidney injury)  ASSESSMENT: PMHx significant for HTN, depression, DM, GERD, CAD, CHF. Admitted with worsening fatigue and weakness and n/v with abdominal pain x 1 week. Work-up reveals AKI 2/2 dehydration.  Pt with 8% wt loss x 6 months. Pt reports that she was in the hospital in February and lost weight during that time because she was limited to the hospital foods. Then, she was d/c'd to a rehab center and continued to have limited intake. She states that when she went home after her rehab stay, she continued to limit her intake because she was happy with her weight loss. Currently eating 75% of Carbohydrate Modified Meals.   She states that she drinks Ensure at home. Agreeable to oral nutrition supplements while here.  Height: Ht Readings from Last 1 Encounters:  07/06/13 5\' 6"  (1.676 m)    Weight: Wt Readings from Last 1 Encounters:  07/07/13 218 lb 1.6 oz (98.93 kg)    Ideal Body Weight: 130 lb  % Ideal Body Weight: 168%  Wt Readings from Last 10 Encounters:  07/07/13 218 lb 1.6 oz (98.93 kg)  04/05/13 232 lb 12.8 oz (105.597 kg)  03/08/13 238 lb (107.956 kg)  02/20/13 238 lb (107.956 kg)  02/15/13 238 lb 9.6 oz (108.228 kg)  01/21/13 237 lb 3.2 oz (107.593 kg)  01/08/13 236 lb 12.8 oz (107.412 kg)  04/12/12 248 lb 6.4 oz (112.674 kg)  02/22/12 248 lb 12.8 oz (112.855 kg)  02/10/12 246 lb  (111.585 kg)    Usual Body Weight: 235 - 240 lb  % Usual Body Weight: 92%  BMI:  Body mass index is 35.22 kg/(m^2). Obese Class II  Estimated Nutritional Needs: Kcal: 1600 - 1800 Protein: 70 - 85 g Fluid: 1.8 - 2 liters  Skin: intact  Diet Order: Carb Control  EDUCATION NEEDS: -No education needs identified at this time   Intake/Output Summary (Last 24 hours) at 07/08/13 1421 Last data filed at 07/08/13 1300  Gross per 24 hour  Intake 4683.33 ml  Output      0 ml  Net 4683.33 ml    Last BM: 12/14  Labs:   Recent Labs Lab 07/06/13 1840 07/07/13 0510 07/08/13 0530  NA 140 140 143  K 5.0 4.1 3.9  CL 96 103 107  CO2 29 26 24   BUN 80* 71* 43*  CREATININE 3.33* 2.54* 1.62*  CALCIUM 9.6 8.8 8.9  GLUCOSE 92 92 146*    CBG (last 3)   Recent Labs  07/08/13 0412 07/08/13 0727 07/08/13 1134  GLUCAP 149* 131* 191*    Scheduled Meds: . aspirin EC  81 mg Oral Daily  . atorvastatin  40 mg Oral QHS  . bisoprolol  5 mg Oral Daily  . buPROPion  150 mg Oral BID  . cholecalciferol  1,000 Units Oral Daily  . DULoxetine  60 mg Oral Daily  . ferrous sulfate  325 mg Oral BID  . heparin  5,000 Units Subcutaneous  Q8H  . insulin aspart  0-15 Units Subcutaneous Q4H  . insulin glargine  20 Units Subcutaneous QHS  . methocarbamol  500 mg Oral Q6H  . multivitamin with minerals  1 tablet Oral Daily  . pantoprazole  20 mg Oral Daily  . pregabalin  75 mg Oral BID  . vitamin B-6  500 mg Oral Daily  . rOPINIRole  2 mg Oral QHS  . traZODone  100 mg Oral QHS  . zolpidem  10 mg Oral QHS    Continuous Infusions: . sodium chloride 125 mL/hr at 07/08/13 0041    Past Medical History  Diagnosis Date  . Hypertension   . Depression   . Diabetes mellitus   . GERD (gastroesophageal reflux disease)   . Obesity   . Coronary artery disease 08/2006    Unstable angina. PCI to distal codominant CFX with 2.5 x 20 Taxus DES...  . CHF (congestive heart failure)   . Sleep apnea      stopped cpap  . Arthritis   . YNWGNFAO(130.8)     Past Surgical History  Procedure Laterality Date  . Coronary stent placement  08/2006    CAD; stent placed @ [facility]  . Total abdominal hysterectomy    . Cardiac catheterization      x2            mcclean  . Lumbar fusion  02/02/2013  . Back surgery      Jarold Motto MS, RD, LDN Pager: [phone] After-hours pager: [phone]

## 2013-07-08 NOTE — Discharge Summary (Signed)
Physician Discharge Summary  Krista Wise XBJ:[id number] DOB: Aug 10, 1945 DOA: 07/06/2013  PCP: [name], NP  Admit date: 07/06/2013 Discharge date: 07/08/2013  Time spent: 30 minutes  Recommendations for Outpatient Follow-up:  1. Follow up with PCP in one week 2. Follow up with bmp IN ONE week.  Discharge Diagnoses:  Principal Problem:   AKI (acute kidney injury) Active Problems:   Diabetes mellitus type 2 with complications   Nausea & vomiting   Discharge Condition: improved.   Diet recommendation: diabetic diet  Filed Weights   07/06/13 2250 07/07/13 2041  Weight: 98.657 kg (217 lb 8 oz) 98.93 kg (218 lb 1.6 oz)    History of present illness:  Krista Wise is a 67 y.o. female who presents to the ED with 1 week history of gradually worsening fatigue, weakness. Associated with N/V and abd pain. Not been able to keep fluids nor food down until today (first day she had no vomiting). No UOP since yesterday. She states she felt like this once before when she was in renal failure.   Hospital Course:  1. AKI - Patient has correctly diagnosed herself with AKI, given history is almost certainly pre-renal due to dehydration, rehydrating with IVF, holding nephrotoxic ACEi and lasix. Gentle hydration with IVF at 125 cc/hr (got 1L bolus in ED) due to h/o CHF in the past. Her repeat renal parameters showed improvement . US kidney is negative for hydronephrosis. She denies any urinary symptoms. Her renal function on discharge is much improved. Recommend checking bmp in 1 to 2 days.  DM2 - holding metformin (in AKI) on discharge. CBG (last 3)   Recent Labs   07/07/13 0426  07/07/13 0729  07/07/13 1128   GLUCAP  92  89  113*    3. N/V - resolved at this time and hasnt had symptoms in 24 hours now, unclear however what caused this to start in the first place, perhaps a viral gastroenteritis?  5. Hypertension: controlled.    Procedures:  US  renal  Consultations:  none  Discharge Exam: Filed Vitals:   07/08/13 1528  BP: 166/64  Pulse: 55  Temp:   Resp:     General: alert afebrile comfortable Cardiovascular: s1s2 Respiratory: ctab  Discharge Instructions  Discharge Orders   Future Orders Complete By Expires   Diet - low sodium heart healthy  As directed    Discharge instructions  As directed    Comments:     Follow up with PCP ino ne week We have stopped lisinopril in view of your ARF, please follow up with your PCP regarding restarting the medications in one week Please check BMP in one week at PCP office.       Medication List    STOP taking these medications       furosemide 20 MG tablet  Commonly known as:  LASIX     lisinopril 20 MG tablet  Commonly known as:  PRINIVIL,ZESTRIL     metFORMIN 1000 MG tablet  Commonly known as:  GLUCOPHAGE      TAKE these medications       albuterol 108 (90 BASE) MCG/ACT inhaler  Commonly known as:  PROVENTIL HFA;VENTOLIN HFA  Inhale into the lungs every 6 (six) hours as needed for wheezing or shortness of breath.     amLODipine 2.5 MG tablet  Commonly known as:  NORVASC  Take 1 tablet (2.5 mg total) by mouth daily.     aspirin EC 81 MG tablet  Take  1 tablet (81 mg total) by mouth daily.     atorvastatin 40 MG tablet  Commonly known as:  LIPITOR  Take 40 mg by mouth at bedtime.     bisoprolol 5 MG tablet  Commonly known as:  ZEBETA  Take 5 mg by mouth daily.     buPROPion 150 MG 12 hr tablet  Commonly known as:  WELLBUTRIN SR  Take 150 mg by mouth 2 (two) times daily.     DULoxetine 60 MG capsule  Commonly known as:  CYMBALTA  Take 60 mg by mouth daily.     feeding supplement (GLUCERNA SHAKE) Liqd  Take 237 mLs by mouth daily.     ferrous sulfate 325 (65 FE) MG tablet  Take 1 tablet (325 mg total) by mouth 2 (two) times daily.     insulin aspart 100 UNIT/ML injection  Commonly known as:  novoLOG  Inject 5 Units into the skin 3 (three)  times daily before meals. For cbg >=150     insulin glargine 100 UNIT/ML injection  Commonly known as:  LANTUS  Inject 45 Units into the skin at bedtime.     methocarbamol 500 MG tablet  Commonly known as:  ROBAXIN  Take 500 mg by mouth every 6 (six) hours. Taking scheduled - every 6 hours while awake     multivitamin with minerals Tabs tablet  Take 1 tablet by mouth daily.     oxyCODONE-acetaminophen 5-325 MG per tablet  Commonly known as:  PERCOCET/ROXICET  Take 1 tablet by mouth every 4 (four) hours as needed for pain.     pregabalin 75 MG capsule  Commonly known as:  LYRICA  Take 75 mg by mouth 2 (two) times daily.     rOPINIRole 2 MG tablet  Commonly known as:  REQUIP  Take 2 mg by mouth at bedtime.     traZODone 50 MG tablet  Commonly known as:  DESYREL  Take 100 mg by mouth at bedtime. For sleep.     vitamin B-6 500 MG tablet  Take 500 mg by mouth daily.     Vitamin D3 1000 UNITS Caps  Take 1,000 Units by mouth daily.       No Known Allergies     Follow-up Information   Follow up with [facility], NP. Schedule an appointment as soon as possible for a visit in 1 week.   Specialty:  Nurse Practitioner   Contact information:   [address] [place] [address] [phone]        The results of significant diagnostics from this hospitalization (including imaging, microbiology, ancillary and laboratory) are listed below for reference.    Significant Diagnostic Studies: Dg Chest 2 View  07/06/2013   CLINICAL DATA:  Weakness with nausea.  EXAM: CHEST  2 VIEW  COMPARISON:  01/21/2013.  FINDINGS: Cardiomegaly. Low lung volumes. Bibasilar opacities could represent early infiltrates or atelectasis. No effusion or pneumothorax. Bones unremarkable.  IMPRESSION: Cardiomegaly. Bibasilar opacities slightly worse from priors. Question subsegmental atelectasis versus early infiltrates.   Electronically Signed   By: Davonna Belling M.D.   On: 07/06/2013 20:07    US Renal  07/08/2013   CLINICAL DATA:  Acute renal failure  EXAM: RENAL/URINARY TRACT ULTRASOUND COMPLETE  COMPARISON:  None.  FINDINGS: Right Kidney:  Length: 12.9 cm. Mild cortical thinning is noted. No mass lesion or hydronephrosis is noted.  Left Kidney:  Length: 12.2 cm. Mild cortical thinning is noted. No mass lesion or hydronephrosis is noted.  Bladder:  Appears normal for degree of bladder distention.  IMPRESSION: Mild cortical thinning bilaterally.  No acute abnormality is noted.   Electronically Signed   By: Alcide Clever M.D.   On: 07/08/2013 08:39    Microbiology: No results found for this or any previous visit (from the past 240 hour(s)).   Labs: Basic Metabolic Panel:  Recent Labs Lab 07/06/13 1840 07/07/13 0510 07/08/13 0530  NA 140 140 143  K 5.0 4.1 3.9  CL 96 103 107  CO2 29 26 24   GLUCOSE 92 92 146*  BUN 80* 71* 43*  CREATININE 3.33* 2.54* 1.62*  CALCIUM 9.6 8.8 8.9   Liver Function Tests:  Recent Labs Lab 07/06/13 1840  AST 17  ALT 12  ALKPHOS 71  BILITOT 0.2*  PROT 7.7  ALBUMIN 3.5    Recent Labs Lab 07/06/13 1840  LIPASE 73*   No results found for this basename: AMMONIA,  in the last 168 hours CBC:  Recent Labs Lab 07/06/13 1840 07/07/13 0510 07/08/13 0530  WBC 8.9 6.8 7.2  NEUTROABS 6.6  --   --   HGB 14.8 13.3 12.9  HCT 44.6 40.8 40.6  MCV 81.4 81.9 82.2  PLT 254 209 189   Cardiac Enzymes: No results found for this basename: CKTOTAL, CKMB, CKMBINDEX, TROPONINI,  in the last 168 hours BNP: BNP (last 3 results) No results found for this basename: PROBNP,  in the last 8760 hours CBG:  Recent Labs Lab 07/07/13 2038 07/07/13 2355 07/08/13 0412 07/08/13 0727 07/08/13 1134  GLUCAP 183* 146* 149* 131* 191*       Signed:  Meera Vasco  [facility] 07/08/2013, 4:22 PM

## 2013-07-09 LAB — URINE CULTURE: Colony Count: >=100000

## 2013-11-07 ENCOUNTER — Other Ambulatory Visit: Payer: Self-pay | Admitting: Nurse Practitioner

## 2013-11-07 DIAGNOSIS — R42 Dizziness and giddiness: Secondary | ICD-10-CM

## 2013-11-10 ENCOUNTER — Ambulatory Visit
Admission: RE | Admit: 2013-11-10 | Discharge: 2013-11-10 | Disposition: A | Discharge status: Home / Self Care | Payer: Medicare Other | Source: Ambulatory Visit | Attending: Nurse Practitioner | Admitting: Nurse Practitioner

## 2013-11-10 DIAGNOSIS — R42 Dizziness and giddiness: Secondary | ICD-10-CM

## 2013-11-20 ENCOUNTER — Other Ambulatory Visit: Payer: Self-pay | Admitting: Adult Health

## 2013-11-23 ENCOUNTER — Other Ambulatory Visit: Payer: Self-pay | Admitting: Adult Health

## 2013-12-15 ENCOUNTER — Other Ambulatory Visit: Payer: Self-pay | Admitting: Cardiology

## 2013-12-20 ENCOUNTER — Other Ambulatory Visit: Payer: Self-pay | Admitting: Cardiology

## 2013-12-29 ENCOUNTER — Other Ambulatory Visit: Payer: Self-pay | Admitting: Cardiology

## 2014-01-01 ENCOUNTER — Other Ambulatory Visit: Payer: Self-pay | Admitting: Cardiology

## 2014-06-03 ENCOUNTER — Encounter: Payer: Self-pay | Admitting: Cardiology

## 2014-06-03 ENCOUNTER — Encounter: Payer: Self-pay | Admitting: Physician Assistant

## 2014-06-03 ENCOUNTER — Ambulatory Visit
Admission: RE | Admit: 2014-06-03 | Discharge: 2014-06-03 | Disposition: A | Discharge status: Home / Self Care | Payer: Medicare Other | Source: Ambulatory Visit | Attending: Nurse Practitioner | Admitting: Nurse Practitioner

## 2014-06-03 ENCOUNTER — Other Ambulatory Visit: Payer: Self-pay | Admitting: Nurse Practitioner

## 2014-06-03 ENCOUNTER — Ambulatory Visit (INDEPENDENT_AMBULATORY_CARE_PROVIDER_SITE_OTHER): Payer: Medicare Other | Admitting: Physician Assistant

## 2014-06-03 VITALS — BP 125/60 | HR 79 | Ht 66.0 in | Wt 213.0 lb

## 2014-06-03 DIAGNOSIS — I10 Essential (primary) hypertension: Secondary | ICD-10-CM

## 2014-06-03 DIAGNOSIS — E785 Hyperlipidemia, unspecified: Secondary | ICD-10-CM

## 2014-06-03 DIAGNOSIS — I2510 Atherosclerotic heart disease of native coronary artery without angina pectoris: Secondary | ICD-10-CM

## 2014-06-03 DIAGNOSIS — I5032 Chronic diastolic (congestive) heart failure: Secondary | ICD-10-CM | POA: Insufficient documentation

## 2014-06-03 DIAGNOSIS — I5033 Acute on chronic diastolic (congestive) heart failure: Secondary | ICD-10-CM

## 2014-06-03 DIAGNOSIS — G4733 Obstructive sleep apnea (adult) (pediatric): Secondary | ICD-10-CM

## 2014-06-03 DIAGNOSIS — I509 Heart failure, unspecified: Secondary | ICD-10-CM

## 2014-06-03 DIAGNOSIS — R0602 Shortness of breath: Secondary | ICD-10-CM

## 2014-06-03 DIAGNOSIS — E118 Type 2 diabetes mellitus with unspecified complications: Secondary | ICD-10-CM

## 2014-06-03 LAB — CBC WITH DIFFERENTIAL/PLATELET
BASOS ABS: 0 10*3/uL (ref 0.0–0.1)
BASOS PCT: 0.5 % (ref 0.0–3.0)
Eosinophils Absolute: 0.3 10*3/uL (ref 0.0–0.7)
Eosinophils Relative: 3.4 % (ref 0.0–5.0)
HEMATOCRIT: 36.2 % (ref 36.0–46.0)
HEMOGLOBIN: 11.3 g/dL — AB (ref 12.0–15.0)
LYMPHS ABS: 1.1 10*3/uL (ref 0.7–4.0)
LYMPHS PCT: 15.1 % (ref 12.0–46.0)
MCHC: 31.1 g/dL (ref 30.0–36.0)
MCV: 84.4 fl (ref 78.0–100.0)
MONOS PCT: 7.9 % (ref 3.0–12.0)
Monocytes Absolute: 0.6 10*3/uL (ref 0.1–1.0)
NEUTROS ABS: 5.4 10*3/uL (ref 1.4–7.7)
Neutrophils Relative %: 73.1 % (ref 43.0–77.0)
Platelets: 220 10*3/uL (ref 150.0–400.0)
RBC: 4.29 Mil/uL (ref 3.87–5.11)
RDW: 16.3 % — AB (ref 11.5–15.5)
WBC: 7.4 10*3/uL (ref 4.0–10.5)

## 2014-06-03 LAB — BASIC METABOLIC PANEL
BUN: 26 mg/dL — ABNORMAL HIGH (ref 6–23)
CO2: 30 meq/L (ref 19–32)
Calcium: 8.9 mg/dL (ref 8.4–10.5)
Chloride: 98 mEq/L (ref 96–112)
Creatinine, Ser: 1.6 mg/dL — ABNORMAL HIGH (ref 0.4–1.2)
GFR: 33.75 mL/min — ABNORMAL LOW (ref >60.00)
Glucose, Bld: 169 mg/dL — ABNORMAL HIGH (ref 70–99)
POTASSIUM: 4.2 meq/L (ref 3.5–5.1)
SODIUM: 143 meq/L (ref 135–145)

## 2014-06-03 LAB — BRAIN NATRIURETIC PEPTIDE: Pro B Natriuretic peptide (BNP): 250 pg/mL — ABNORMAL HIGH (ref 0.0–100.0)

## 2014-06-03 MED ORDER — METOLAZONE 2.5 MG PO TABS: 2.5000 mg | ORAL_TABLET | Freq: Every day | ORAL | Status: DC

## 2014-06-03 MED ORDER — POTASSIUM CHLORIDE CRYS ER 20 MEQ PO TBCR: EXTENDED_RELEASE_TABLET | ORAL | Status: DC

## 2014-06-03 MED ORDER — FUROSEMIDE 80 MG PO TABS: 80.0000 mg | ORAL_TABLET | Freq: Two times a day (BID) | ORAL | Status: DC

## 2014-06-03 NOTE — Progress Notes (Signed)
Cardiology Office Note   Date:  06/03/2014   ID:  Krista Wise, DOB [birth year], MRN [id number]  PCP:  [name],[name], NP  Cardiologist:  Dr. Marca Anconaalton McLean      History of Present Illness: Krista Wise is a 68 y.o. female with a history of CAD, DM, HTN, OHS/OSA, and diastolic CHF. She had unstable angina in 2/08 with a stent placed in her distal CFX. Last LHC 4/12: 80-90% mid and distal circumflex stenoses before and after the prior-placed stent and a totally occluded moderate-sized OM1. She received 2 drug eluting stents in the CFX. Admitted 2/13 with hypercarbic respiratory failure requiring bipap. She was thought to have acute bronchitis with reactive airways disease and decompensation of her baseline poor respiratory status. She also had acute on chronic diastolic CHF.  Mildly elevated troponin was thought to be due to demand ischemia. FU Lexiscan myoview done after this hospitalization showed EF 68%, moderate partially reversible lateral defect that actually looked better than the prior myoview.  Last seen by Norma FredricksonLori Gerhardt, NP 03/2013.    She was seen in her primary care provider's office today. There was concern for acute CHF and she was added on to my schedule. Chest x-ray has already been performed. The patient is here with her husband. She typically takes Lasix 40 mg twice a day. Over the past month, she has noted increasing LE edema and increasing shortness of breath with exertion. She increased her Lasix to 60 mg twice a day about 2 weeks ago without significant improvement.  Of note, she is typically on a scooter. She typically walks very short distances around her house with a walker. She sleeps in a recliner due to back pain. She denies PND. She denies chest discomfort. She denies syncope. She denies significant cough.  Studies:  - LHC (10/2010):  EF 55%, proximal RCA 30%, mid RCA 30%, distal PDA 70%, OM1 occluded, circumflex 80-90% prior to DES and 80-90% after DES in the  distal circumflex, mid LAD 40%, small ostial D1 80%, small D2 90%  >>> PCI:  2.5x 16 mm Promus DES and 2.75 x 28 mm Promus DES (non-overlapping) to mid CFX  - Echo (2/13):  Mild LVH, EF 60%, diastolic dysfunction, mild AI, mild MR, mild LAE, mild RVE, mildly reduced RV function, PASP 61 mmHg  - Nuclear (3/13):   EF 68%, moderate partially reversible lateral defect that actually looked better than the prior myoview.   Recent Labs/Images:  07/06/2013: ALT 12 07/08/2013: BUN 43*; Creatinine 1.62*; Hemoglobin 12.9; Potassium 3.9; Sodium 143   Dg Chest 2 View  06/03/2014     IMPRESSION: 1. There is low-grade compensated CHF little changed since the previous study. 2. There is new lingular atelectasis.   Electronically Signed   By: David  Swazil[place]Jordan   On: 06/03/2014 14:30 (compared to CXR 06/2013).    Wt Readings from Last 3 Encounters:  06/03/14 213 lb (96.616 kg)  07/07/13 218 lb 1.6 oz (98.93 kg)  04/05/13 232 lb 12.8 oz (105.597 kg)     Past Medical History:  1. Hypertension  2. Depression  3. DM  4. Obesity  5. GERD  6. CAD: Unstable angina 2/08. PCI to distal codominant CFX with 2.5 x 20 Taxus DES. Lexiscan myoview (3/12): chest pain and dyspnea with stress, EF 62%, small fixed lateral perfusion defect with significant surrounding reversible perfusion defect suggestive of significant ischemia, ischemic ECG response. LHC (4/12): 80-90% mid and distal CFX stenosis before and after prior-placed stent,  total occlusion of a moderate 1st OM. Patient had DES x 2 to CFX. Lexiscan myoview (3/13) with EF 68%, moderate partially reversible lateral defect c/w prior MI/peri-infarct ischemia. Defect less marked than on the prior study.  7. Fibromyalgia  8. Diastolic CHF: Echo (3/12) with EF 55-60%, mild LV hypertrophy, mild aortic stenosis (mean gradient 13 mmHg), mild MR, normal RV size and systolic function, PA systolic pressure 55 mmHg. Echo (2/13): EF 60%, mild LVH, moderate diastolic  dysfunction, mild AI, aortic sclerosis, mild MR, mild RV dilation and dysfunction, PASP 61 mmHg.  9. Aortic stenosis: Minimal by most recent echo.  10. Diabetic retinopathy  11. H/o vertigo  12. Peripheral neuropathy likely from diabetes.  13. OHS/OSA: Severe OSA on sleep study. Patient is off CPAP currently b/c she could not tolerate the mask Past Medical History  Diagnosis Date  . Hypertension   . Depression   . Diabetes mellitus   . GERD (gastroesophageal reflux disease)   . Obesity   . Coronary artery disease 08/2006    Unstable angina. PCI to distal codominant CFX with 2.5 x 20 Taxus DES...  . CHF (congestive heart failure)   . Sleep apnea     stopped cpap  . Arthritis   . [id number](914.7)     Current Outpatient Prescriptions  Medication Sig Dispense Refill  . albuterol (PROVENTIL HFA;VENTOLIN HFA) 108 (90 BASE) MCG/ACT inhaler Inhale into the lungs every 6 (six) hours as needed for wheezing or shortness of breath.    Marland Kitchen amLODipine (NORVASC) 2.5 MG tablet Take 1 tablet (2.5 mg total) by mouth daily. 15 tablet 0  . aspirin EC 81 MG tablet Take 1 tablet (81 mg total) by mouth daily.    Marland Kitchen atorvastatin (LIPITOR) 40 MG tablet Take 40 mg by mouth at bedtime.     . bisoprolol (ZEBETA) 5 MG tablet Take 5 mg by mouth daily.    Marland Kitchen buPROPion (WELLBUTRIN SR) 150 MG 12 hr tablet Take 150 mg by mouth 2 (two) times daily.    . Cholecalciferol (VITAMIN D3) 1000 UNITS CAPS Take 1,000 Units by mouth daily.    . DULoxetine (CYMBALTA) 60 MG capsule Take 60 mg by mouth daily.    . ferrous sulfate 325 (65 FE) MG tablet Take 1 tablet (325 mg total) by mouth 2 (two) times daily. 60 tablet 3  . insulin aspart (NOVOLOG) 100 UNIT/ML injection Inject 5 Units into the skin 3 (three) times daily before meals. For cbg >=150 3 vial PRN  . insulin glargine (LANTUS) 100 UNIT/ML injection Inject 45 Units into the skin at bedtime.    . Multiple Vitamin (MULTIVITAMIN WITH MINERALS) TABS Take 1 tablet by mouth  daily.    Marland Kitchen oxyCODONE-acetaminophen (PERCOCET/ROXICET) 5-325 MG per tablet Take 1 tablet by mouth every 4 (four) hours as needed for pain.    . pregabalin (LYRICA) 75 MG capsule Take 75 mg by mouth 2 (two) times daily.    . Pyridoxine HCl (VITAMIN B-6) 500 MG tablet Take 500 mg by mouth daily.    Marland Kitchen rOPINIRole (REQUIP) 2 MG tablet Take 2 mg by mouth at bedtime.     . traZODone (DESYREL) 50 MG tablet Take 100 mg by mouth at bedtime. For sleep.    . [DISCONTINUED] simvastatin (ZOCOR) 40 MG tablet Take 40 mg by mouth every evening.     No current facility-administered medications for this visit.     Allergies:   Review of patient's allergies indicates no known allergies.   Social  History:  The patient  reports that she has never smoked. She has never used smokeless tobacco. She reports that she does not drink alcohol or use illicit drugs.   Family History:  The patient's family history includes Anemia in her mother; Heart attack in her father; Heart failure in her father; Hypertension in her mother.   ROS:  Please see the history of present illness.     All other systems reviewed and negative.    PHYSICAL EXAM: VS:  BP 125/60 mmHg  Pulse 79  Ht 5\' 6"  (1.676 m)  Wt 213 lb (96.616 kg)  BMI 34.40 kg/m2 Well nourished, well developed, in no acute distress HEENT: normal Neck: + JVDat 90 Cardiac:  normal S1, S2;  RRR; 2/6 systolic murmurRUSB Lungs:   Coarse breath sounds at the bases bilaterally, no wheezing, rhonchi or rales Abd: soft, nontender, no hepatomegaly Ext: tight 2+ bilateral  LE edemato her knees Skin: warm and dry Neuro:  CNs 2-12 intact, no focal abnormalities noted  EKG:  NSR, HR 79, leftward axis, nonspecific ST-T wave changes, no change from prior tracings      ASSESSMENT AND PLAN:  1.  Acute on chronic diastolic CHF (congestive heart failure):  She has evidence of volume overload on exam. She has not been seen here since September 2014. She lost weight after that  visit. She does not weigh herself consistently at home. She does admit to dietary indiscretion with salt. She has had no success with increasing furosemide on her own. Recent chest x-ray today was reviewed after the patient left the office. This does not demonstrate any significant changes since prior chest x-ray.    -  Add metolazone 2.5 mg 2 doses (once tomorrow and once on Friday) 30 minutes prior to a.m. Furosemide.    -  Increase furosemide to 80 mg twice a day.    -  Take potassium 20 milliequivalents on days that she takes metolazone only.    -  Arrange follow-up echocardiogram    -  Chek BMET, CBC, BNP today.    -  Repeat BMET later this week and again next week to monitor renal function and potassium. 2.  Coronary Artery Disease:  No angina. Continue aspirin, statin, beta blocker. 3.  Essential hypertension:  Controlled. 4.  Hyperlipidemia:  Continue statin. 5.  OSA (obstructive sleep apnea):  Continue CPAP. 6.  Diabetes mellitus type 2 with complications:  She tells me that her recent A1c was 7.3. Fair control. Continue follow-up with primary care.   Disposition:   FU with me or Dr. Shirlee LatchMcLean in 2 weeks.   Signed, Brynda RimScott Janaia Kozel, PA-C, MHS 06/03/2014 2:06 PM    [facility] [address], [place], [place]  [address] Phone: [phone]; Fax: [phone]

## 2014-06-03 NOTE — Patient Instructions (Signed)
START METOLAZONE 2.5 MG DAILY 30 MINUTES BEFORE YOU TAKE YOUR LASIX TOMORROW MORNING; THEN TAKE AGAIN ON Friday 11/13  INCREASE LASIX TO 80 MG TWICE DAILY; NEW RX SENT IN TODAY  LAB WORK TODAY; BMET, CBC W/DIFF, BNP  REPEAT BMET 11/13 MORNING BMET 11/18  Your physician has requested that you have an echocardiogram. Echocardiography is a painless test that uses sound waves to create images of your heart. It provides your doctor with information about the size and shape of your heart and how well your heart's chambers and valves are working. This procedure takes approximately one hour. There are no restrictions for this procedure.  Your physician recommends that you schedule a follow-up appointment in: 2 WEEKS WITH SCOTT WEAVER, PAC SAME DAY DR. Shirlee LatchMCLEAN IS IN THE OFFICE

## 2014-06-05 ENCOUNTER — Telehealth: Payer: Self-pay | Admitting: *Deleted

## 2014-06-05 NOTE — Telephone Encounter (Signed)
reached pt's husband on cell # DPR o file. I explained to him that I wanted to make sure pt knew only to take the metolazone x 2 days then stop. Rx sent in for # 90 in error. Husband said they did not give them #90, but gave them # 30. I verified with pt's husband again to make sure she only takes it the 2 times as ordered by Tereso NewcomerScott Weaver, PA. Husband said yes that pt's second dose is 11/13 AM then stop; and she is also to come in for lab work 11/13 AM as well. I stated to husband that was correct. I apologized for the wrong quantity was sent in.

## 2014-06-06 ENCOUNTER — Other Ambulatory Visit (INDEPENDENT_AMBULATORY_CARE_PROVIDER_SITE_OTHER): Payer: Medicare Other | Admitting: *Deleted

## 2014-06-06 ENCOUNTER — Telehealth: Payer: Self-pay | Admitting: *Deleted

## 2014-06-06 DIAGNOSIS — I5033 Acute on chronic diastolic (congestive) heart failure: Secondary | ICD-10-CM

## 2014-06-06 LAB — BASIC METABOLIC PANEL
BUN: 33 mg/dL — AB (ref 6–23)
CO2: 30 mEq/L (ref 19–32)
CREATININE: 2.4 mg/dL — AB (ref 0.4–1.2)
Calcium: 9.3 mg/dL (ref 8.4–10.5)
Chloride: 93 mEq/L — ABNORMAL LOW (ref 96–112)
GFR: 21.14 mL/min — ABNORMAL LOW (ref >60.00)
GLUCOSE: 166 mg/dL — AB (ref 70–99)
POTASSIUM: 4 meq/L (ref 3.5–5.1)
Sodium: 138 mEq/L (ref 135–145)

## 2014-06-06 NOTE — Telephone Encounter (Signed)
s/w both pt and her husband of lab results. Per husband he states edema not better, I then advised per Bing NeighborsScott W. PA to cont. lasix 80 bid. get bmet 11/16. Pt coming in 11/18 for echo and lab already. I will check w/PA to see if 11/18 ok for lab

## 2014-06-08 NOTE — Progress Notes (Signed)
Would have her followup with me in CHF clinic.

## 2014-06-09 NOTE — Progress Notes (Signed)
[facility] arrange follow up with Dr. Shirlee LatchMcLean in the CHF clinic next week / no later than the first of December.

## 2014-06-10 ENCOUNTER — Telehealth: Payer: Self-pay | Admitting: Cardiology

## 2014-06-10 NOTE — Telephone Encounter (Signed)
Pt's states that pt has been taking Lasix 80 mg twice a day and is not helping. Pt is more SOB now then when she was seen in 11/10, her edema in LE is up to her abdomen and she is not able to walk due to the edema and SOB she has labs and echo tomorrow 11/18, she would like to be seen then when pt comes for labs and echo. There are no opening with PA/NP this week. An  appointment was made with Dr. Shirlee LatchMclean on Friday 06/13/14 at 3:45 PM pt and husband are aware.

## 2014-06-10 NOTE — Telephone Encounter (Signed)
Call patient about appointment change.  She stated she has more SOB and legs are up to mid thigh.  When I was seen last last week only had swelling legs up to knees.    Have to have assistant with getting up and moving my legs.

## 2014-06-11 ENCOUNTER — Other Ambulatory Visit (INDEPENDENT_AMBULATORY_CARE_PROVIDER_SITE_OTHER): Payer: Medicare Other | Admitting: *Deleted

## 2014-06-11 ENCOUNTER — Ambulatory Visit (HOSPITAL_COMMUNITY): Payer: Medicare Other | Attending: Nurse Practitioner | Admitting: Radiology

## 2014-06-11 ENCOUNTER — Other Ambulatory Visit: Payer: Self-pay | Admitting: *Deleted

## 2014-06-11 DIAGNOSIS — R0602 Shortness of breath: Secondary | ICD-10-CM | POA: Diagnosis not present

## 2014-06-11 DIAGNOSIS — I2510 Atherosclerotic heart disease of native coronary artery without angina pectoris: Secondary | ICD-10-CM | POA: Insufficient documentation

## 2014-06-11 DIAGNOSIS — I5033 Acute on chronic diastolic (congestive) heart failure: Secondary | ICD-10-CM

## 2014-06-11 DIAGNOSIS — I10 Essential (primary) hypertension: Secondary | ICD-10-CM | POA: Diagnosis not present

## 2014-06-11 DIAGNOSIS — E785 Hyperlipidemia, unspecified: Secondary | ICD-10-CM | POA: Insufficient documentation

## 2014-06-11 DIAGNOSIS — E119 Type 2 diabetes mellitus without complications: Secondary | ICD-10-CM | POA: Diagnosis not present

## 2014-06-11 DIAGNOSIS — R739 Hyperglycemia, unspecified: Secondary | ICD-10-CM

## 2014-06-11 DIAGNOSIS — R7309 Other abnormal glucose: Secondary | ICD-10-CM

## 2014-06-11 LAB — BASIC METABOLIC PANEL
BUN: 52 mg/dL — ABNORMAL HIGH (ref 6–23)
CALCIUM: 9.8 mg/dL (ref 8.4–10.5)
CO2: 33 meq/L — AB (ref 19–32)
CREATININE: 3 mg/dL — AB (ref 0.4–1.2)
Chloride: 93 mEq/L — ABNORMAL LOW (ref 96–112)
GFR: 16.64 mL/min — AB (ref >60.00)
GLUCOSE: 207 mg/dL — AB (ref 70–99)
Potassium: 3.8 mEq/L (ref 3.5–5.1)
SODIUM: 138 meq/L (ref 135–145)

## 2014-06-11 NOTE — Progress Notes (Signed)
Echocardiogram performed.  

## 2014-06-12 ENCOUNTER — Encounter: Payer: Self-pay | Admitting: Physician Assistant

## 2014-06-13 ENCOUNTER — Telehealth: Payer: Self-pay | Admitting: *Deleted

## 2014-06-13 ENCOUNTER — Encounter: Payer: Self-pay | Admitting: *Deleted

## 2014-06-13 ENCOUNTER — Ambulatory Visit (INDEPENDENT_AMBULATORY_CARE_PROVIDER_SITE_OTHER): Payer: Medicare Other | Admitting: Cardiology

## 2014-06-13 VITALS — BP 130/64 | HR 73 | Ht 67.0 in | Wt 240.0 lb

## 2014-06-13 DIAGNOSIS — I5032 Chronic diastolic (congestive) heart failure: Secondary | ICD-10-CM

## 2014-06-13 DIAGNOSIS — N183 Chronic kidney disease, stage 3 unspecified: Secondary | ICD-10-CM

## 2014-06-13 DIAGNOSIS — I2510 Atherosclerotic heart disease of native coronary artery without angina pectoris: Secondary | ICD-10-CM

## 2014-06-13 DIAGNOSIS — I5033 Acute on chronic diastolic (congestive) heart failure: Secondary | ICD-10-CM

## 2014-06-13 DIAGNOSIS — R0602 Shortness of breath: Secondary | ICD-10-CM

## 2014-06-13 DIAGNOSIS — I10 Essential (primary) hypertension: Secondary | ICD-10-CM

## 2014-06-13 LAB — BASIC METABOLIC PANEL
BUN: 52 mg/dL — ABNORMAL HIGH (ref 6–23)
CHLORIDE: 95 meq/L — AB (ref 96–112)
CO2: 32 meq/L (ref 19–32)
Calcium: 9.3 mg/dL (ref 8.4–10.5)
Creat: 2.38 mg/dL — ABNORMAL HIGH (ref 0.50–1.10)
GLUCOSE: 124 mg/dL — AB (ref 70–99)
POTASSIUM: 4.3 meq/L (ref 3.5–5.3)
SODIUM: 139 meq/L (ref 135–145)

## 2014-06-13 LAB — CBC WITH DIFFERENTIAL/PLATELET
Basophils Absolute: 0.1 10*3/uL (ref 0.0–0.1)
Basophils Relative: 1 % (ref 0–1)
EOS ABS: 0.2 10*3/uL (ref 0.0–0.7)
Eosinophils Relative: 4 % (ref 0–5)
HEMATOCRIT: 32.8 % — AB (ref 36.0–46.0)
HEMOGLOBIN: 11 g/dL — AB (ref 12.0–15.0)
LYMPHS ABS: 1.1 10*3/uL (ref 0.7–4.0)
Lymphocytes Relative: 19 % (ref 12–46)
MCH: 26.6 pg (ref 26.0–34.0)
MCHC: 33.5 g/dL (ref 30.0–36.0)
MCV: 79.2 fL (ref 78.0–100.0)
MONOS PCT: 11 % (ref 3–12)
MPV: 9.9 fL (ref 9.4–12.4)
Monocytes Absolute: 0.7 10*3/uL (ref 0.1–1.0)
Neutro Abs: 3.9 10*3/uL (ref 1.7–7.7)
Neutrophils Relative %: 65 % (ref 43–77)
Platelets: 213 10*3/uL (ref 150–400)
RBC: 4.14 MIL/uL (ref 3.87–5.11)
RDW: 15.8 % — ABNORMAL HIGH (ref 11.5–15.5)
WBC: 6 10*3/uL (ref 4.0–10.5)

## 2014-06-13 LAB — LIPID PANEL
CHOL/HDL RATIO: 4.7 ratio
CHOLESTEROL: 121 mg/dL (ref 0–200)
HDL: 26 mg/dL — ABNORMAL LOW (ref >39)
LDL Cholesterol: 51 mg/dL (ref 0–99)
Triglycerides: 219 mg/dL — ABNORMAL HIGH (ref <150)
VLDL: 44 mg/dL — AB (ref 0–40)

## 2014-06-13 LAB — BRAIN NATRIURETIC PEPTIDE: Brain Natriuretic Peptide: 120.6 pg/mL — ABNORMAL HIGH (ref 0.0–100.0)

## 2014-06-13 NOTE — Telephone Encounter (Signed)
pt notified about echo results with verbal understanding. Pt asked was she still supposed to stay of the lasix. I said yes per Dr. Shirlee LatchMcLean 11/18 lab results. Pt advised to keep her appt today with Dr. Shirlee LatchMcLean today at 3:45, pt said ok and thank you.

## 2014-06-13 NOTE — Patient Instructions (Signed)
Start lasix 80mg  daily in the morning.  Your physician recommends that you have  lab work today--BMET/BNP/CBCd/Lipid profile.  You are scheduled for a Right Heart Catheterization on Tuesday November 24,2015. See instruction sheet.  Your physician recommends that you schedule a follow-up appointment in: 2 weeks in the Heart Failure Clinic at [facility].

## 2014-06-15 ENCOUNTER — Encounter: Payer: Self-pay | Admitting: Cardiology

## 2014-06-15 DIAGNOSIS — N183 Chronic kidney disease, stage 3 unspecified: Secondary | ICD-10-CM | POA: Insufficient documentation

## 2014-06-15 NOTE — Progress Notes (Signed)
Patient ID: Krista KalataMary O Scheidegger, female   DOB: Oct 05, 1945, 68 y.o.   MRN: [id number] PCP: Tomi BambergerSusan [name]  68 yo with history of CAD, DM, HTN, OHS/OSA, and diastolic CHF presents for followup. She had unstable angina in 2/08 with a stent placed in her distal CFX. She had a myoview in 2012 that was ischemic by both ECG and perfusion images. Echo showed preserved EF (55-60%), but there was moderate pulmonary hypertension suggesting likely diastolic LV dysfunction.  I took her to the cath lab in 4/12 where left heart cath showed 80-90% mid and distal circumflex stenoses before and after the prior-placed stent and a totally occluded moderate-sized OM1.  She received 2 drug eluting stents in the CFX.   In 2/13, she was admitted with hypercarbic respiratory failure requiring bipap.  She was thought to have acute bronchitis with reactive airways disease and decompensation of her baseline poor respiratory status.  She also had acute on chronic diastolic CHF.  Her beta blocker was changed to bisoprolol because of wheezing.  Mildly elevated troponin was thought to be due to demand ischemia.  She lost 11 lbs in the hospital with diuresis.  She wears oxygen with exertion but is not using CPAP (it is not comfortable for her).  Lexiscan myoview done after this hospitalization showed EF 68%, moderate partially reversible lateral defect that actually looked better than the prior myoview.    Last echo in 11/15 showed EF 55-60% with mild LVH.  She has had increased dyspnea for several months.  She saw Tereso NewcomerScott Weaver on 06/03/14 and was noted to have increased weight and lower extremity edema.  At baseline, she walks with a walker and uses a wheelchair outside the house (since 7/14 back surgery).  She sleeps in a chair because of back surgery.  No PND.  Poor energy.  [name] short of breath walking in her house at this point.  When she saw PA Alben SpittleWeaver, she was started on metolazone 2.5 mg in addition to Lasix 80 mg bid, but she did not  diurese well.  Followup BMET showed that creatinine had increased from 1.6 to 3.  She has held diuretics now for a couple of days. It is difficult to tell what her weight is doing.  Last weight was probably no accurate.    Labs (3/12): BNP 417 => 309, K 4.4, creatinine 0.7, HCT 44, free T3/T4 normal, TSH normal, HDL 38, LDL 96.5  Labs (5/12): BNP 422 => 160, K 3.9, creatinine 0.9, HDL 33, LDL 84 Labs (6/12): LDL 50, HDL 34, TGs 197 Labs (7/12): K 4.1, creatinine 1.1 Labs (11/12): K 3.9, creatinine 1.4, BNP 91 Labs (2/13): K 3.6, creatinine 0.92 Labs (3/13): K 5.5 => 4, creatinine 2.2 => 0.9, HDL 33, TGs 335, LDL 54, BNP 76 Labs (7/13): K 4, creatinine 1.2 Labs (11/15): K 3.8, creatinine 1.6 => 2.4 => 3.0, pBNP 250  Allergies (verified):  No Known Drug Allergies   Family History:  Father: Deceased; MI in his 7470s, CABG, CHF  Mother: Alive; HTN, rhematoid arthritis, anemic   Social History:  Married, lives with husband in [place]  disabled: diabetes & fibromyagia  Tobacco Use - No.  Alcohol Use - no  Regular Exercise - no   Past Medical History:  1. Hypertension  2. Depression  3. DM  4. Obesity  5. GERD  6. CAD: Unstable angina 2/08. PCI to distal codominant CFX with 2.5 x 20 Taxus DES. Lexiscan myoview (3/12): chest pain and dyspnea with  stress, EF 62%, small fixed lateral perfusion defect with significant surrounding reversible perfusion defect suggestive of significant ischemia, ischemic ECG response.  LHC (4/12): 80-90% mid and distal CFX stenosis before and after prior-placed stent, total occlusion of a moderate 1st OM.  Patient had DES x 2 to CFX.   Lexiscan myoview (3/13) with EF 68%, moderate partially reversible lateral defect c/w prior MI/peri-infarct ischemia.  Defect less marked than on the prior study.  7. Fibromyalgia  8. Diastolic CHF: Echo (3/12) with EF 55-60%, mild LV hypertrophy, mild aortic stenosis (mean gradient 13 mmHg), mild MR, normal RV size and systolic  function, PA systolic pressure 55 mmHg.  Echo (2/13): EF 60%, mild LVH, moderate diastolic dysfunction, mild AI, aortic sclerosis, mild MR, mild RV dilation and dysfunction, PASP 61 mmHg.  Echo (11/15) with EF 55-60%, mild LVH, mild AI, mild MR.   9. Aortic stenosis: Minimal by most recent echo.  10.  Diabetic retinopathy 11.  H/o vertigo 12.  Peripheral neuropathy likely from diabetes.  13. OHS/OSA: Severe OSA on sleep study.  Patient is off CPAP currently b/c she could not tolerate the mask.  14. Back surgery in 7/14.  15. CKD   Current Outpatient Prescriptions  Medication Sig Dispense Refill  . albuterol (PROVENTIL HFA;VENTOLIN HFA) 108 (90 BASE) MCG/ACT inhaler Inhale into the lungs every 6 (six) hours as needed for wheezing or shortness of breath.    Marland Kitchen. amLODipine (NORVASC) 2.5 MG tablet Take 1 tablet (2.5 mg total) by mouth daily. 15 tablet 0  . aspirin EC 81 MG tablet Take 1 tablet (81 mg total) by mouth daily.    Marland Kitchen. atorvastatin (LIPITOR) 40 MG tablet Take 40 mg by mouth at bedtime.     . bisoprolol (ZEBETA) 5 MG tablet Take 5 mg by mouth daily.    Marland Kitchen. buPROPion (WELLBUTRIN SR) 150 MG 12 hr tablet Take 150 mg by mouth 2 (two) times daily.    . Cholecalciferol (VITAMIN D3) 1000 UNITS CAPS Take 1,000 Units by mouth daily.    . DULoxetine (CYMBALTA) 60 MG capsule Take 60 mg by mouth daily.    . ferrous sulfate 325 (65 FE) MG tablet Take 1 tablet (325 mg total) by mouth 2 (two) times daily. 60 tablet 3  . furosemide (LASIX) 80 MG tablet Take 80 mg by mouth daily. One tablet every morning 60 tablet 11  . insulin aspart (NOVOLOG) 100 UNIT/ML injection Inject 5 Units into the skin 3 (three) times daily before meals. For cbg >=150 3 vial PRN  . insulin glargine (LANTUS) 100 UNIT/ML injection Inject 45 Units into the skin at bedtime.    . Multiple Vitamin (MULTIVITAMIN WITH MINERALS) TABS Take 1 tablet by mouth daily.    Marland Kitchen. oxyCODONE-acetaminophen (PERCOCET/ROXICET) 5-325 MG per tablet Take 1  tablet by mouth every 4 (four) hours as needed for pain.    . pregabalin (LYRICA) 75 MG capsule Take 75 mg by mouth 2 (two) times daily.    . Pyridoxine HCl (VITAMIN B-6) 500 MG tablet Take 500 mg by mouth daily.    Marland Kitchen. rOPINIRole (REQUIP) 2 MG tablet Take 2 mg by mouth at bedtime.     . traZODone (DESYREL) 50 MG tablet Take 100 mg by mouth at bedtime. For sleep.    . [DISCONTINUED] simvastatin (ZOCOR) 40 MG tablet Take 40 mg by mouth every evening.     No current facility-administered medications for this visit.    BP 130/64 mmHg  Pulse 73  Ht 5'  7" (1.702 m)  Wt 240 lb (108.863 kg)  BMI 37.58 kg/m2 General: Well developed, well nourished, in no acute distress. Obese.  Neck: Neck supple, JVP 8-9 cm. No masses, thyromegaly or abnormal cervical nodes.  Lungs: Clear bilaterally  Heart: Non-displaced PMI, chest non-tender; regular rate and rhythm, S1, S2 without rubs or gallops. 2/6 early systolic murmur RUSB. Carotid upstroke normal, no bruit. Pedals normal pulses. 2+ edema to knees Abdomen: Bowel sounds positive; abdomen soft and non-tender without masses, organomegaly, or hernias noted. No hepatosplenomegaly.  Extremities: No clubbing or cyanosis.  Neurologic: Alert and oriented x 3.   Assessment/Plan: 1. CAD: No chest pain.  Continue ASA 81, statin, bisoprolol.  2. Chronic diastolic CHF: [name] at least mildly volume overloaded, but creatinine recently rose considerably with metolazone use.  She has held all diuretics for a couple of days now.   - BMET/BNP today.  - May restart Lasix 80 mg once daily tomorrow. No metolazone.  - RHC next week.  - Followup in 2 wks in CHF clinic.  3. Hyperlipidemia: Check lipids today.  4. HTN: BP controlled.  5. OHS/OSA: Unable to tolerate CPAP.  She should wear oxygen with exertion and at night.  6. CKD: Creatinine higher with metolazone.  Diuretics held, restart Lasix 80 mg once daily tomorrow.  Probable cardiorenal component.   [name] 06/15/2014   [name] 06/15/2014

## 2014-06-16 ENCOUNTER — Telehealth: Payer: Self-pay | Admitting: Cardiology

## 2014-06-16 NOTE — Telephone Encounter (Signed)
Patient's husband Molly MaduroRobert called back. Advised of Dr. Shirlee LatchMcLean [facility] He said Wed will work ok for them but please try to schedule early.  Pt already has all instructions. Rescheduled with cath lab for Wed at 8:30 am/ arrive at 6:30 am.  Pt's husband aware of change. Advised to call back with any questions/concerns.

## 2014-06-16 NOTE — Telephone Encounter (Signed)
Dr. Shirlee LatchMcLean called the office this morning stating that he needs to r/s the patient's cath from 11/24 to 11/25 due to a funeral he has to attend tomorrow. I attempted to call the patient's home #- the phone rang multiple times- no machine- unable to leave a message. I tried her cell # and left her a message to call the office today.

## 2014-06-18 ENCOUNTER — Telehealth: Payer: Self-pay | Admitting: *Deleted

## 2014-06-18 ENCOUNTER — Other Ambulatory Visit (HOSPITAL_COMMUNITY): Payer: Self-pay | Admitting: Cardiology

## 2014-06-18 ENCOUNTER — Ambulatory Visit (HOSPITAL_COMMUNITY)
Admission: RE | Admit: 2014-06-18 | Discharge: 2014-06-18 | Disposition: A | Discharge status: Home / Self Care | Payer: Medicare Other | Source: Ambulatory Visit | Attending: Cardiology | Admitting: Cardiology

## 2014-06-18 ENCOUNTER — Encounter (HOSPITAL_COMMUNITY)
Admission: RE | Disposition: A | Discharge status: Home / Self Care | Payer: Self-pay | Source: Ambulatory Visit | Attending: Cardiology

## 2014-06-18 ENCOUNTER — Other Ambulatory Visit: Payer: Self-pay | Admitting: *Deleted

## 2014-06-18 DIAGNOSIS — I272 Other secondary pulmonary hypertension: Secondary | ICD-10-CM | POA: Diagnosis not present

## 2014-06-18 DIAGNOSIS — I509 Heart failure, unspecified: Secondary | ICD-10-CM | POA: Diagnosis present

## 2014-06-18 DIAGNOSIS — N189 Chronic kidney disease, unspecified: Secondary | ICD-10-CM | POA: Insufficient documentation

## 2014-06-18 DIAGNOSIS — K219 Gastro-esophageal reflux disease without esophagitis: Secondary | ICD-10-CM | POA: Insufficient documentation

## 2014-06-18 DIAGNOSIS — I2510 Atherosclerotic heart disease of native coronary artery without angina pectoris: Secondary | ICD-10-CM | POA: Diagnosis not present

## 2014-06-18 DIAGNOSIS — I129 Hypertensive chronic kidney disease with stage 1 through stage 4 chronic kidney disease, or unspecified chronic kidney disease: Secondary | ICD-10-CM | POA: Diagnosis not present

## 2014-06-18 DIAGNOSIS — N179 Acute kidney failure, unspecified: Secondary | ICD-10-CM | POA: Diagnosis not present

## 2014-06-18 DIAGNOSIS — F329 Major depressive disorder, single episode, unspecified: Secondary | ICD-10-CM | POA: Diagnosis not present

## 2014-06-18 DIAGNOSIS — E11319 Type 2 diabetes mellitus with unspecified diabetic retinopathy without macular edema: Secondary | ICD-10-CM | POA: Insufficient documentation

## 2014-06-18 DIAGNOSIS — G4733 Obstructive sleep apnea (adult) (pediatric): Secondary | ICD-10-CM | POA: Insufficient documentation

## 2014-06-18 DIAGNOSIS — E119 Type 2 diabetes mellitus without complications: Secondary | ICD-10-CM | POA: Insufficient documentation

## 2014-06-18 DIAGNOSIS — I5031 Acute diastolic (congestive) heart failure: Secondary | ICD-10-CM | POA: Insufficient documentation

## 2014-06-18 DIAGNOSIS — Z794 Long term (current) use of insulin: Secondary | ICD-10-CM | POA: Insufficient documentation

## 2014-06-18 DIAGNOSIS — Z7982 Long term (current) use of aspirin: Secondary | ICD-10-CM | POA: Diagnosis not present

## 2014-06-18 DIAGNOSIS — E669 Obesity, unspecified: Secondary | ICD-10-CM | POA: Diagnosis not present

## 2014-06-18 DIAGNOSIS — I5033 Acute on chronic diastolic (congestive) heart failure: Secondary | ICD-10-CM

## 2014-06-18 HISTORY — PX: RIGHT HEART CATHETERIZATION: SHX5447

## 2014-06-18 LAB — POCT I-STAT 3, VENOUS BLOOD GAS (G3P V)
Acid-Base Excess: 7 mmol/L — ABNORMAL HIGH (ref 0.0–2.0)
Bicarbonate: 32.8 mEq/L — ABNORMAL HIGH (ref 20.0–24.0)
O2 Saturation: 77 %
PH VEN: 7.39 — AB (ref 7.250–7.300)
TCO2: 34 mmol/L (ref 0–100)
pCO2, Ven: 54.2 mmHg — ABNORMAL HIGH (ref 45.0–50.0)
pO2, Ven: 43 mmHg (ref 30.0–45.0)

## 2014-06-18 LAB — POCT I-STAT, CHEM 8
BUN: 26 mg/dL — ABNORMAL HIGH (ref 6–23)
CHLORIDE: 98 meq/L (ref 96–112)
Calcium, Ion: 1.21 mmol/L (ref 1.13–1.30)
Creatinine, Ser: 1.6 mg/dL — ABNORMAL HIGH (ref 0.50–1.10)
GLUCOSE: 226 mg/dL — AB (ref 70–99)
HEMATOCRIT: 34 % — AB (ref 36.0–46.0)
HEMOGLOBIN: 11.6 g/dL — AB (ref 12.0–15.0)
Potassium: 4 mEq/L (ref 3.7–5.3)
Sodium: 143 mEq/L (ref 137–147)
TCO2: 30 mmol/L (ref 0–100)

## 2014-06-18 LAB — PROTIME-INR
INR: 1.08 (ref 0.00–1.49)
Prothrombin Time: 14.1 seconds (ref 11.6–15.2)

## 2014-06-18 LAB — GLUCOSE, CAPILLARY
Glucose-Capillary: 227 mg/dL — ABNORMAL HIGH (ref 70–99)
Glucose-Capillary: 209 mg/dL — ABNORMAL HIGH (ref 70–99)

## 2014-06-18 SURGERY — RIGHT HEART CATH
Anesthesia: LOCAL

## 2014-06-18 MED ORDER — SODIUM CHLORIDE 0.9 % IJ SOLN: 3.0000 mL | Freq: Two times a day (BID) | INTRAMUSCULAR | Status: DC

## 2014-06-18 MED ORDER — ACETAMINOPHEN 325 MG PO TABS: 650.0000 mg | ORAL_TABLET | ORAL | Status: DC | PRN

## 2014-06-18 MED ORDER — ONDANSETRON HCL 4 MG/2ML IJ SOLN: 4.0000 mg | Freq: Four times a day (QID) | INTRAMUSCULAR | Status: DC | PRN

## 2014-06-18 MED ORDER — SODIUM CHLORIDE 0.9 % IV SOLN: 250.0000 mL | INTRAVENOUS | Status: DC | PRN

## 2014-06-18 MED ORDER — LIDOCAINE HCL (PF) 1 % IJ SOLN
INTRAMUSCULAR | Status: AC
  Filled 2014-06-18: qty 30

## 2014-06-18 MED ORDER — TORSEMIDE 20 MG PO TABS: 60.0000 mg | ORAL_TABLET | Freq: Every day | ORAL | Status: DC

## 2014-06-18 MED ORDER — MIDAZOLAM HCL 2 MG/2ML IJ SOLN
INTRAMUSCULAR | Status: AC
  Filled 2014-06-18: qty 2

## 2014-06-18 MED ORDER — FENTANYL CITRATE 0.05 MG/ML IJ SOLN
INTRAMUSCULAR | Status: AC
  Filled 2014-06-18: qty 2

## 2014-06-18 MED ORDER — SODIUM CHLORIDE 0.9 % IJ SOLN: 3.0000 mL | INTRAMUSCULAR | Status: DC | PRN

## 2014-06-18 MED ORDER — ASPIRIN 81 MG PO CHEW
81.0000 mg | CHEWABLE_TABLET | ORAL | Status: AC
  Administered 2014-06-18: 81 mg via ORAL

## 2014-06-18 MED ORDER — HEPARIN (PORCINE) IN NACL 2-0.9 UNIT/ML-% IJ SOLN
INTRAMUSCULAR | Status: AC
  Filled 2014-06-18: qty 500

## 2014-06-18 MED ORDER — ASPIRIN 81 MG PO CHEW
CHEWABLE_TABLET | ORAL | Status: AC
  Administered 2014-06-18: 81 mg via ORAL
  Filled 2014-06-18: qty 1

## 2014-06-18 NOTE — H&P (View-Only) (Signed)
Patient ID: Krista KalataMary O Wise, female   DOB: Oct 05, 1945, 68 y.o.   MRN: [id number] PCP: Krista BambergerSusan Wise  68 yo with history of CAD, DM, HTN, OHS/OSA, and diastolic CHF presents for followup. She had unstable angina in 2/08 with a stent placed in her distal CFX. She had a myoview in 2012 that was ischemic by both ECG and perfusion images. Echo showed preserved EF (55-60%), but there was moderate pulmonary hypertension suggesting likely diastolic LV dysfunction.  I took her to the cath lab in 4/12 where left heart cath showed 80-90% mid and distal circumflex stenoses before and after the prior-placed stent and a totally occluded moderate-sized OM1.  She received 2 drug eluting stents in the CFX.   In 2/13, she was admitted with hypercarbic respiratory failure requiring bipap.  She was thought to have acute bronchitis with reactive airways disease and decompensation of her baseline poor respiratory status.  She also had acute on chronic diastolic CHF.  Her beta blocker was changed to bisoprolol because of wheezing.  Mildly elevated troponin was thought to be due to demand ischemia.  She lost 11 lbs in the hospital with diuresis.  She wears oxygen with exertion but is not using CPAP (it is not comfortable for her).  Lexiscan myoview done after this hospitalization showed EF 68%, moderate partially reversible lateral defect that actually looked better than the prior myoview.    Last echo in 11/15 showed EF 55-60% with mild LVH.  She has had increased dyspnea for several months.  She saw Tereso NewcomerScott Wise on 06/03/14 and was noted to have increased weight and lower extremity edema.  At baseline, she walks with a walker and uses a wheelchair outside the house (since 7/14 back surgery).  She sleeps in a chair because of back surgery.  No PND.  Poor energy.  [name] short of breath walking in her house at this point.  When she saw PA Krista SpittleWeaver, she was started on metolazone 2.5 mg in addition to Lasix 80 mg bid, but she did not  diurese well.  Followup BMET showed that creatinine had increased from 1.6 to 3.  She has held diuretics now for a couple of days. It is difficult to tell what her weight is doing.  Last weight was probably no accurate.    Labs (3/12): BNP 417 => 309, K 4.4, creatinine 0.7, HCT 44, free T3/T4 normal, TSH normal, HDL 38, LDL 96.5  Labs (5/12): BNP 422 => 160, K 3.9, creatinine 0.9, HDL 33, LDL 84 Labs (6/12): LDL 50, HDL 34, TGs 197 Labs (7/12): K 4.1, creatinine 1.1 Labs (11/12): K 3.9, creatinine 1.4, BNP 91 Labs (2/13): K 3.6, creatinine 0.92 Labs (3/13): K 5.5 => 4, creatinine 2.2 => 0.9, HDL 33, TGs 335, LDL 54, BNP 76 Labs (7/13): K 4, creatinine 1.2 Labs (11/15): K 3.8, creatinine 1.6 => 2.4 => 3.0, pBNP 250  Allergies (verified):  No Known Drug Allergies   Family History:  Father: Deceased; MI in his 7470s, CABG, CHF  Mother: Alive; HTN, rhematoid arthritis, anemic   Social History:  Married, lives [place] in [place]  disabled: diabetes & fibromyagia  Tobacco Use - No.  Alcohol Use - no  Regular Exercise - no   Past Medical History:  1. Hypertension  2. Depression  3. DM  4. Obesity  5. GERD  6. CAD: Unstable angina 2/08. PCI to distal codominant CFX with 2.5 x 20 Taxus DES. Lexiscan myoview (3/12): chest pain and dyspnea with  stress, EF 62%, small fixed lateral perfusion defect with significant surrounding reversible perfusion defect suggestive of significant ischemia, ischemic ECG response.  LHC (4/12): 80-90% mid and distal CFX stenosis before and after prior-placed stent, total occlusion of a moderate 1st OM.  Patient had DES x 2 to CFX.   Lexiscan myoview (3/13) with EF 68%, moderate partially reversible lateral defect c/w prior MI/peri-infarct ischemia.  Defect less marked than on the prior study.  7. Fibromyalgia  8. Diastolic CHF: Echo (3/12) with EF 55-60%, mild LV hypertrophy, mild aortic stenosis (mean gradient 13 mmHg), mild MR, normal RV size and systolic  function, PA systolic pressure 55 mmHg.  Echo (2/13): EF 60%, mild LVH, moderate diastolic dysfunction, mild AI, aortic sclerosis, mild MR, mild RV dilation and dysfunction, PASP 61 mmHg.  Echo (11/15) with EF 55-60%, mild LVH, mild AI, mild MR.   9. Aortic stenosis: Minimal by most recent echo.  10.  Diabetic retinopathy 11.  H/o vertigo 12.  Peripheral neuropathy likely from diabetes.  13. OHS/OSA: Severe OSA on sleep study.  Patient is off CPAP currently b/c she could not tolerate the mask.  14. Back surgery in 7/14.  15. CKD   Current Outpatient Prescriptions  Medication Sig Dispense Refill  . albuterol (PROVENTIL HFA;VENTOLIN HFA) 108 (90 BASE) MCG/ACT inhaler Inhale into the lungs every 6 (six) hours as needed for wheezing or shortness of breath.    Marland Kitchen. amLODipine (NORVASC) 2.5 MG tablet Take 1 tablet (2.5 mg total) by mouth daily. 15 tablet 0  . aspirin EC 81 MG tablet Take 1 tablet (81 mg total) by mouth daily.    Marland Kitchen. atorvastatin (LIPITOR) 40 MG tablet Take 40 mg by mouth at bedtime.     . bisoprolol (ZEBETA) 5 MG tablet Take 5 mg by mouth daily.    Marland Kitchen. buPROPion (WELLBUTRIN SR) 150 MG 12 hr tablet Take 150 mg by mouth 2 (two) times daily.    . Cholecalciferol (VITAMIN D3) 1000 UNITS CAPS Take 1,000 Units by mouth daily.    . DULoxetine (CYMBALTA) 60 MG capsule Take 60 mg by mouth daily.    . ferrous sulfate 325 (65 FE) MG tablet Take 1 tablet (325 mg total) by mouth 2 (two) times daily. 60 tablet 3  . furosemide (LASIX) 80 MG tablet Take 80 mg by mouth daily. One tablet every morning 60 tablet 11  . insulin aspart (NOVOLOG) 100 UNIT/ML injection Inject 5 Units into the skin 3 (three) times daily before meals. For cbg >=150 3 vial PRN  . insulin glargine (LANTUS) 100 UNIT/ML injection Inject 45 Units into the skin at bedtime.    . Multiple Vitamin (MULTIVITAMIN WITH MINERALS) TABS Take 1 tablet by mouth daily.    Marland Kitchen. oxyCODONE-acetaminophen (PERCOCET/ROXICET) 5-325 MG per tablet Take 1  tablet by mouth every 4 (four) hours as needed for pain.    . pregabalin (LYRICA) 75 MG capsule Take 75 mg by mouth 2 (two) times daily.    . Pyridoxine HCl (VITAMIN B-6) 500 MG tablet Take 500 mg by mouth daily.    Marland Kitchen. rOPINIRole (REQUIP) 2 MG tablet Take 2 mg by mouth at bedtime.     . traZODone (DESYREL) 50 MG tablet Take 100 mg by mouth at bedtime. For sleep.    . [DISCONTINUED] simvastatin (ZOCOR) 40 MG tablet Take 40 mg by mouth every evening.     No current facility-administered medications for this visit.    BP 130/64 mmHg  Pulse 73  Ht 5'  7" (1.702 m)  Wt 240 lb (108.863 kg)  BMI 37.58 kg/m2 General: Well developed, well nourished, in no acute distress. Obese.  Neck: Neck supple, JVP 8-9 cm. No masses, thyromegaly or abnormal cervical nodes.  Lungs: Clear bilaterally  Heart: Non-displaced PMI, chest non-tender; regular rate and rhythm, S1, S2 without rubs or gallops. 2/6 early systolic murmur RUSB. Carotid upstroke normal, no bruit. Pedals normal pulses. 2+ edema to knees Abdomen: Bowel sounds positive; abdomen soft and non-tender without masses, organomegaly, or hernias noted. No hepatosplenomegaly.  Extremities: No clubbing or cyanosis.  Neurologic: Alert and oriented x 3.   Assessment/Plan: 1. CAD: No chest pain.  Continue ASA 81, statin, bisoprolol.  2. Chronic diastolic CHF: [name] at least mildly volume overloaded, but creatinine recently rose considerably with metolazone use.  She has held all diuretics for a couple of days now.   - BMET/BNP today.  - May restart Lasix 80 mg once daily tomorrow. No metolazone.  - RHC next week.  - Followup in 2 wks in CHF clinic.  3. Hyperlipidemia: Check lipids today.  4. HTN: BP controlled.  5. OHS/OSA: Unable to tolerate CPAP.  She should wear oxygen with exertion and at night.  6. CKD: Creatinine higher with metolazone.  Diuretics held, restart Lasix 80 mg once daily tomorrow.  Probable cardiorenal component.   [name] 06/15/2014   [name] 06/15/2014

## 2014-06-18 NOTE — Discharge Instructions (Signed)
Angiogram, Care After °Refer to this sheet in the next few weeks. These instructions provide you with information on caring for yourself after your procedure. Your health care provider may also give you more specific instructions. Your treatment has been planned according to current medical practices, but problems sometimes occur. Call your health care provider if you have any problems or questions after your procedure.  °WHAT TO EXPECT AFTER THE PROCEDURE °After your procedure, it is typical to have the following sensations: °· Minor discomfort or tenderness and a small bump at the catheter insertion site. The bump should usually decrease in size and tenderness within 1 to 2 weeks. °· Any bruising will usually fade within 2 to 4 weeks. °HOME CARE INSTRUCTIONS  °· You may need to keep taking blood thinners if they were prescribed for you. Take medicines only as directed by your health care provider. °· Do not apply powder or lotion to the site. °· Do not take baths, swim, or use a hot tub until your health care provider approves. °· You may shower 24 hours after the procedure. Remove the bandage (dressing) and gently wash the site with plain soap and water. Gently pat the site dry. °· Inspect the site at least twice daily. °· Limit your activity for the first 48 hours. Do not bend, squat, or lift anything over 20 lb (9 kg) or as directed by your health care provider. °· Plan to have someone take you home after the procedure. Follow instructions about when you can drive or return to work. °SEEK MEDICAL CARE IF: °· You get light-headed when standing up. °· You have drainage (other than a small amount of blood on the dressing). °· You have chills. °· You have a fever. °· You have redness, warmth, swelling, or pain at the insertion site. °SEEK IMMEDIATE MEDICAL CARE IF:  °· You develop chest pain or shortness of breath, feel faint, or pass out. °· You have bleeding, swelling larger than a walnut, or drainage from the  catheter insertion site. °· You develop pain, discoloration, coldness, or severe bruising in the leg or arm that held the catheter. °· You have heavy bleeding from the site. If this happens, hold pressure on the site and call 911. °MAKE SURE YOU: °· Understand these instructions. °· Will watch your condition. °· Will get help right away if you are not doing well or get worse. °Document Released: 01/27/2005 Document Revised: 11/25/2013 Document Reviewed: 12/03/2012 °Exit[employer]® Patient Information ©2015 [employer]. This information is not intended to replace advice given to you by your health care provider. Make sure you discuss any questions you have with your health care provider. ° °

## 2014-06-18 NOTE — CV Procedure (Signed)
    Cardiac Catheterization Procedure Note  Name: Krista KalataMary O Wise MRN: [id number] DOB: June 12, 1946  Procedure: Right Heart Cath  Indication: CHF, AKI   Procedural Details: The right brachial area was prepped, draped, and anesthetized with 1% lidocaine. There was a pre-existing IV in the right brachial area.  This was replaced with a 5 French venous sheath. A Swan-Ganz catheter was used for the right heart catheterization. Standard protocol was followed for recording of right heart pressures and sampling of oxygen saturations. Fick cardiac output was calculated.  There were no immediate procedural complications. The patient was transferred to the post catheterization recovery area for further monitoring.  Procedural Findings: Hemodynamics (mmHg) RA mean 8 RV 49/10 PA 53/24, mean 36 PCWP mean 19  Oxygen saturations: PA 77% AO 98%  Cardiac Output (Fick) 9.02  Cardiac Index (Fick) 4.23 PVR 1.9 WU   Final Conclusions:  Filling pressures are mildly elevated, primarily pulmonary venous hypertension.  Creatinine improved.  I am going to transition her from Lasix to torsemide 60 mg daily.  She will need BMET in 1 week and followup in CHF clinic.  She will need overnight oximetry.   Marca AnconaDalton [name] 06/18/2014, 9:04 AM

## 2014-06-18 NOTE — Interval H&P Note (Signed)
History and Physical Interval Note:  06/18/2014 9:03 AM  Krista KalataMary O Wise  has presented today for surgery, with the diagnosis of heart failure  The various methods of treatment have been discussed with the patient and family. After consideration of risks, benefits and other options for treatment, the patient has consented to  Procedure(s): RIGHT HEART CATH (N/A) as a surgical intervention .  The patient's history has been reviewed, patient examined, no change in status, stable for surgery.  I have reviewed the patient's chart and labs.  Questions were answered to the patient's satisfaction.     Atzin Buchta [employer]

## 2014-06-18 NOTE — Telephone Encounter (Signed)
6 refills °

## 2014-06-18 NOTE — Telephone Encounter (Signed)
Pharmacy calling to have a verbal on Torsemide. Torsemide 20mg  (60mg  daily) for 30 days (#90) with 0 refills were given. Pharmacy was unable to tell what the refills were on rx. I will send to Dr. Shirlee LatchMcLean to review for more refills. Pt has appointment 06/24/14 [facility] Dr [facility]Mc[facility]

## 2014-06-23 ENCOUNTER — Ambulatory Visit: Payer: Medicare Other | Admitting: Physician Assistant

## 2014-06-24 ENCOUNTER — Ambulatory Visit (HOSPITAL_COMMUNITY)
Admission: RE | Admit: 2014-06-24 | Discharge: 2014-06-24 | Disposition: A | Discharge status: Home / Self Care | Payer: Medicare Other | Source: Ambulatory Visit | Attending: Cardiology | Admitting: Cardiology

## 2014-06-24 ENCOUNTER — Encounter (HOSPITAL_COMMUNITY): Payer: Self-pay

## 2014-06-24 ENCOUNTER — Ambulatory Visit (HOSPITAL_COMMUNITY): Admission: RE | Admit: 2014-06-24 | Payer: Medicare Other | Source: Ambulatory Visit

## 2014-06-24 VITALS — BP 129/67 | HR 78 | Resp 18 | Wt 218.0 lb

## 2014-06-24 DIAGNOSIS — I25110 Atherosclerotic heart disease of native coronary artery with unstable angina pectoris: Secondary | ICD-10-CM | POA: Diagnosis not present

## 2014-06-24 DIAGNOSIS — E669 Obesity, unspecified: Secondary | ICD-10-CM | POA: Diagnosis not present

## 2014-06-24 DIAGNOSIS — M797 Fibromyalgia: Secondary | ICD-10-CM | POA: Diagnosis not present

## 2014-06-24 DIAGNOSIS — I272 Other secondary pulmonary hypertension: Secondary | ICD-10-CM | POA: Insufficient documentation

## 2014-06-24 DIAGNOSIS — K219 Gastro-esophageal reflux disease without esophagitis: Secondary | ICD-10-CM | POA: Diagnosis not present

## 2014-06-24 DIAGNOSIS — E785 Hyperlipidemia, unspecified: Secondary | ICD-10-CM | POA: Insufficient documentation

## 2014-06-24 DIAGNOSIS — N183 Chronic kidney disease, stage 3 unspecified: Secondary | ICD-10-CM

## 2014-06-24 DIAGNOSIS — I129 Hypertensive chronic kidney disease with stage 1 through stage 4 chronic kidney disease, or unspecified chronic kidney disease: Secondary | ICD-10-CM | POA: Insufficient documentation

## 2014-06-24 DIAGNOSIS — Z7982 Long term (current) use of aspirin: Secondary | ICD-10-CM | POA: Diagnosis not present

## 2014-06-24 DIAGNOSIS — I5032 Chronic diastolic (congestive) heart failure: Secondary | ICD-10-CM | POA: Diagnosis not present

## 2014-06-24 DIAGNOSIS — I5030 Unspecified diastolic (congestive) heart failure: Secondary | ICD-10-CM | POA: Diagnosis present

## 2014-06-24 DIAGNOSIS — Z79899 Other long term (current) drug therapy: Secondary | ICD-10-CM | POA: Insufficient documentation

## 2014-06-24 DIAGNOSIS — G629 Polyneuropathy, unspecified: Secondary | ICD-10-CM | POA: Insufficient documentation

## 2014-06-24 DIAGNOSIS — G4733 Obstructive sleep apnea (adult) (pediatric): Secondary | ICD-10-CM

## 2014-06-24 DIAGNOSIS — Z794 Long term (current) use of insulin: Secondary | ICD-10-CM | POA: Insufficient documentation

## 2014-06-24 DIAGNOSIS — F329 Major depressive disorder, single episode, unspecified: Secondary | ICD-10-CM | POA: Insufficient documentation

## 2014-06-24 DIAGNOSIS — N189 Chronic kidney disease, unspecified: Secondary | ICD-10-CM | POA: Insufficient documentation

## 2014-06-24 DIAGNOSIS — E11319 Type 2 diabetes mellitus with unspecified diabetic retinopathy without macular edema: Secondary | ICD-10-CM | POA: Diagnosis not present

## 2014-06-24 DIAGNOSIS — I2510 Atherosclerotic heart disease of native coronary artery without angina pectoris: Secondary | ICD-10-CM

## 2014-06-24 LAB — BASIC METABOLIC PANEL
Anion gap: 16 — ABNORMAL HIGH (ref 5–15)
BUN: 31 mg/dL — ABNORMAL HIGH (ref 6–23)
CHLORIDE: 98 meq/L (ref 96–112)
CO2: 29 meq/L (ref 19–32)
Calcium: 9.3 mg/dL (ref 8.4–10.5)
Creatinine, Ser: 1.68 mg/dL — ABNORMAL HIGH (ref 0.50–1.10)
GFR calc non Af Amer: 31 mL/min — ABNORMAL LOW (ref >90)
GFR, EST AFRICAN AMERICAN: 36 mL/min — AB (ref >90)
Glucose, Bld: 115 mg/dL — ABNORMAL HIGH (ref 70–99)
POTASSIUM: 4.4 meq/L (ref 3.7–5.3)
SODIUM: 143 meq/L (ref 137–147)

## 2014-06-24 LAB — PRO B NATRIURETIC PEPTIDE: PRO B NATRI PEPTIDE: 3974 pg/mL — AB (ref 0–125)

## 2014-06-24 NOTE — Patient Instructions (Signed)
Labs today  You have been referred to [facility] for the overnight oximetry, they will contact you with with further instructions  Your physician recommends that you schedule a follow-up appointment in: 1 month  Do the following things EVERYDAY: 1) Weigh yourself in the morning before breakfast. Write it down and keep it in a log. 2) Take your medicines as prescribed 3) Eat low salt foods-Limit salt (sodium) to 2000 mg per day.  4) Stay as active as you can everyday 5) Limit all fluids for the day to less than 2 liters 6)

## 2014-06-25 NOTE — Progress Notes (Signed)
Patient ID: Theodoro KalataMary O Simek, female   DOB: June 09, 1946, 68 y.o.   MRN: [id number] PCP: Tomi BambergerSusan [name]  68 yo with history of CAD, DM, HTN, OHS/OSA, and diastolic CHF presents for followup. She had unstable angina in 2/08 with a stent placed in her distal CFX. She had a myoview in 2012 that was ischemic by both ECG and perfusion images. Echo showed preserved EF (55-60%), but there was moderate pulmonary hypertension suggesting likely diastolic LV dysfunction.  I took her to the cath lab in 4/12 where left heart cath showed 80-90% mid and distal circumflex stenoses before and after the prior-placed stent and a totally occluded moderate-sized OM1.  She received 2 drug eluting stents in the CFX.   In 2/13, she was admitted with hypercarbic respiratory failure requiring bipap.  She was thought to have acute bronchitis with reactive airways disease and decompensation of her baseline poor respiratory status.  She also had acute on chronic diastolic CHF.  Her beta blocker was changed to bisoprolol because of wheezing.  Mildly elevated troponin was thought to be due to demand ischemia.  She lost 11 lbs in the hospital with diuresis.  She wears oxygen with exertion but is not using CPAP (it is not comfortable for her).  Lexiscan myoview done after this hospitalization showed EF 68%, moderate partially reversible lateral defect that actually looked better than the prior myoview.    Last echo in 11/15 showed EF 55-60% with mild LVH.  She developed increased dyspnea for several months.  She saw Tereso NewcomerScott Weaver on 06/03/14 and was noted to have increased weight and lower extremity edema.  At baseline, she walks with a walker and uses a wheelchair outside the house (since 7/14 back surgery).  She sleeps in a chair because of back surgery.  No PND.  Poor energy.  [name] short of breath walking in her house at this point.  When she saw PA Alben SpittleWeaver, she was started on metolazone 2.5 mg in addition to Lasix 80 mg bid, but she did not  diurese well.  Followup BMET showed that creatinine had increased from 1.6 to 3.  She held her diuretics and I took her for RHC on 11/15, showing mean RA 8, PA 53/24, and mean PCWP 19.  I started her on torsemide 60 mg daily.   Since that time, weight is down around 20 lbs.  [name] short of breath after walking 20 feet but this is better than prior to starting torsemide.  Per husband, she seems to be doing better.     Labs (3/12): BNP 417 => 309, K 4.4, creatinine 0.7, HCT 44, free T3/T4 normal, TSH normal, HDL 38, LDL 96.5  Labs (5/12): BNP 422 => 160, K 3.9, creatinine 0.9, HDL 33, LDL 84 Labs (6/12): LDL 50, HDL 34, TGs 197 Labs (7/12): K 4.1, creatinine 1.1 Labs (11/12): K 3.9, creatinine 1.4, BNP 91 Labs (2/13): K 3.6, creatinine 0.92 Labs (3/13): K 5.5 => 4, creatinine 2.2 => 0.9, HDL 33, TGs 335, LDL 54, BNP 76 Labs (7/13): K 4, creatinine 1.2 Labs (11/15): K 3.8 => 4, creatinine 1.6 => 2.4 => 3.0 => 1.6, pBNP 250, LDL 51, HDL 26  Allergies (verified):  No Known Drug Allergies   Family History:  Father: Deceased; MI in his 3170s, CABG, CHF  Mother: Alive; HTN, rhematoid arthritis, anemic   Social History:  Married, lives [place] in [place]  disabled: diabetes & fibromyagia  Tobacco Use - No.  Alcohol Use -  no  Regular Exercise - no   ROS: All systems reviewed and negative except as per HPI.   Past Medical History:  1. Hypertension  2. Depression  3. DM  4. Obesity  5. GERD  6. CAD: Unstable angina 2/08. PCI to distal codominant CFX with 2.5 x 20 Taxus DES. Lexiscan myoview (3/12): chest pain and dyspnea with stress, EF 62%, small fixed lateral perfusion defect with significant surrounding reversible perfusion defect suggestive of significant ischemia, ischemic ECG response.  LHC (4/12): 80-90% mid and distal CFX stenosis before and after prior-placed stent, total occlusion of a moderate 1st OM.  Patient had DES x 2 to CFX.   Lexiscan myoview (3/13) with EF 68%,  moderate partially reversible lateral defect c/w prior MI/peri-infarct ischemia.  Defect less marked than on the prior study.  7. Fibromyalgia  8. Diastolic CHF: Echo (3/12) with EF 55-60%, mild LV hypertrophy, mild aortic stenosis (mean gradient 13 mmHg), mild MR, normal RV size and systolic function, PA systolic pressure 55 mmHg.  Echo (2/13): EF 60%, mild LVH, moderate diastolic dysfunction, mild AI, aortic sclerosis, mild MR, mild RV dilation and dysfunction, PASP 61 mmHg.  Echo (11/15) with EF 55-60%, mild LVH, mild AI, mild MR.  RHC (11/15) with mean RA 8, PA 53/24 mean 36, mean PCWP 19, CI 4.23, PVR 1.9 WU.  9. Aortic stenosis: Minimal by most recent echo.  10.  Diabetic retinopathy 11.  H/o vertigo 12.  Peripheral neuropathy likely from diabetes.  13. OHS/OSA: Severe OSA on sleep study.  Patient is off CPAP currently b/c she could not tolerate the mask.  14. Back surgery in 7/14.  15. CKD   Current Outpatient Prescriptions  Medication Sig Dispense Refill  . albuterol (PROVENTIL HFA;VENTOLIN HFA) 108 (90 BASE) MCG/ACT inhaler Inhale into the lungs every 6 (six) hours as needed for wheezing or shortness of breath.    Marland Kitchen. amLODipine (NORVASC) 2.5 MG tablet Take 1 tablet (2.5 mg total) by mouth daily. 15 tablet 0  . aspirin EC 81 MG tablet Take 1 tablet (81 mg total) by mouth daily.    Marland Kitchen. atorvastatin (LIPITOR) 40 MG tablet Take 40 mg by mouth at bedtime.     . Cholecalciferol (VITAMIN D3) 1000 UNITS CAPS Take 1,000 Units by mouth daily.    . DULoxetine (CYMBALTA) 60 MG capsule Take 60 mg by mouth daily.    . ferrous sulfate 325 (65 FE) MG tablet Take 1 tablet (325 mg total) by mouth 2 (two) times daily. (Patient taking differently: Take 325 mg by mouth daily with breakfast. ) 60 tablet 3  . gabapentin (NEURONTIN) 400 MG capsule Take 400 mg by mouth 2 (two) times daily.    Marland Kitchen. ibuprofen (ADVIL,MOTRIN) 200 MG tablet Take 400 mg by mouth every 6 (six) hours as needed (pain).    . insulin aspart  (NOVOLOG) 100 UNIT/ML injection Inject 5 Units into the skin 3 (three) times daily before meals. For cbg >=150 3 vial PRN  . lisinopril (PRINIVIL,ZESTRIL) 20 MG tablet Take 20 mg by mouth daily.    . metFORMIN (GLUCOPHAGE) 1000 MG tablet Take 1,000 mg by mouth 2 (two) times daily with a meal.    . metolazone (ZAROXOLYN) 2.5 MG tablet Take 2.5 mg by mouth daily.    . Multiple Vitamin (MULTIVITAMIN WITH MINERALS) TABS Take 1 tablet by mouth daily.    Marland Kitchen. oxybutynin (DITROPAN-XL) 10 MG 24 hr tablet Take 10 mg by mouth every morning.    . pantoprazole (PROTONIX)  40 MG tablet Take 40 mg by mouth daily.    . pregabalin (LYRICA) 75 MG capsule Take 75 mg by mouth 2 (two) times daily.    Marland Kitchen rOPINIRole (REQUIP) 2 MG tablet Take 2 mg by mouth at bedtime.     . torsemide (DEMADEX) 20 MG tablet Take 3 tablets (60 mg total) by mouth daily. 90 tablet 6  . traZODone (DESYREL) 50 MG tablet Take 75 mg by mouth at bedtime. For sleep.    . insulin glargine (LANTUS) 100 UNIT/ML injection Inject 45 Units into the skin at bedtime.    . [DISCONTINUED] simvastatin (ZOCOR) 40 MG tablet Take 40 mg by mouth every evening.     No current facility-administered medications for this encounter.    BP 129/67 mmHg  Pulse 78  Resp 18  Wt 218 lb (98.884 kg)  SpO2 95% General: Well developed, well nourished, in no acute distress. Obese.  Neck: Neck supple, JVP 8 cm. No masses, thyromegaly or abnormal cervical nodes.  Lungs: Clear bilaterally  Heart: Non-displaced PMI, chest non-tender; regular rate and rhythm, S1, S2 without rubs or gallops. 2/6 early systolic murmur RUSB. Carotid upstroke normal, no bruit. Pedals normal pulses. 1+ edema to knees Abdomen: Bowel sounds positive; abdomen soft and non-tender without masses, organomegaly, or hernias noted. No hepatosplenomegaly.  Extremities: No clubbing or cyanosis.  Neurologic: Alert and oriented x 3.   Assessment/Plan: 1. CAD: No chest pain.  Continue ASA 81, statin,  bisoprolol.  2. Chronic diastolic CHF: Volume status is better on torsemide and weight is down.  I am going to have her continue torsemide at current dose.   - BMET/BNP today.  - She will need overnight oximetry as she is no longer using oxygen at night and is reluctant to restart it.   3. Hyperlipidemia: Good lipids recently.  4. HTN: BP controlled.  5. OHS/OSA: Unable to tolerate CPAP.  As above, probably needs to restart nocturnal oxygen.  Will get overnight oximetry.   6. CKD: BMET today with initiation of torsemide.    Followup in 1 month.   [name] 06/25/2014

## 2014-07-03 ENCOUNTER — Encounter (HOSPITAL_COMMUNITY): Payer: Self-pay | Admitting: Cardiology

## 2014-07-12 ENCOUNTER — Inpatient Hospital Stay (HOSPITAL_COMMUNITY)
Admission: EM | Admit: 2014-07-12 | Discharge: 2014-07-19 | DRG: 291 | Disposition: A | Discharge status: Home Health | Payer: Medicare Other | Attending: Internal Medicine | Admitting: Internal Medicine

## 2014-07-12 ENCOUNTER — Emergency Department (HOSPITAL_COMMUNITY): Payer: Medicare Other

## 2014-07-12 ENCOUNTER — Encounter (HOSPITAL_COMMUNITY): Payer: Self-pay | Admitting: Emergency Medicine

## 2014-07-12 DIAGNOSIS — N185 Chronic kidney disease, stage 5: Secondary | ICD-10-CM | POA: Diagnosis present

## 2014-07-12 DIAGNOSIS — I350 Nonrheumatic aortic (valve) stenosis: Secondary | ICD-10-CM | POA: Diagnosis present

## 2014-07-12 DIAGNOSIS — J9601 Acute respiratory failure with hypoxia: Secondary | ICD-10-CM | POA: Diagnosis present

## 2014-07-12 DIAGNOSIS — I959 Hypotension, unspecified: Secondary | ICD-10-CM | POA: Diagnosis not present

## 2014-07-12 DIAGNOSIS — Z7982 Long term (current) use of aspirin: Secondary | ICD-10-CM

## 2014-07-12 DIAGNOSIS — K219 Gastro-esophageal reflux disease without esophagitis: Secondary | ICD-10-CM | POA: Diagnosis present

## 2014-07-12 DIAGNOSIS — T80212A Local infection due to central venous catheter, initial encounter: Secondary | ICD-10-CM

## 2014-07-12 DIAGNOSIS — R778 Other specified abnormalities of plasma proteins: Secondary | ICD-10-CM | POA: Insufficient documentation

## 2014-07-12 DIAGNOSIS — G629 Polyneuropathy, unspecified: Secondary | ICD-10-CM | POA: Diagnosis present

## 2014-07-12 DIAGNOSIS — J189 Pneumonia, unspecified organism: Secondary | ICD-10-CM | POA: Diagnosis present

## 2014-07-12 DIAGNOSIS — I2510 Atherosclerotic heart disease of native coronary artery without angina pectoris: Secondary | ICD-10-CM | POA: Diagnosis present

## 2014-07-12 DIAGNOSIS — Z79899 Other long term (current) drug therapy: Secondary | ICD-10-CM

## 2014-07-12 DIAGNOSIS — E611 Iron deficiency: Secondary | ICD-10-CM | POA: Diagnosis present

## 2014-07-12 DIAGNOSIS — I272 Other secondary pulmonary hypertension: Secondary | ICD-10-CM | POA: Diagnosis present

## 2014-07-12 DIAGNOSIS — N179 Acute kidney failure, unspecified: Secondary | ICD-10-CM | POA: Diagnosis present

## 2014-07-12 DIAGNOSIS — Z6841 Body Mass Index (BMI) 40.0 and over, adult: Secondary | ICD-10-CM

## 2014-07-12 DIAGNOSIS — Z794 Long term (current) use of insulin: Secondary | ICD-10-CM | POA: Diagnosis not present

## 2014-07-12 DIAGNOSIS — I5033 Acute on chronic diastolic (congestive) heart failure: Principal | ICD-10-CM | POA: Diagnosis present

## 2014-07-12 DIAGNOSIS — I120 Hypertensive chronic kidney disease with stage 5 chronic kidney disease or end stage renal disease: Secondary | ICD-10-CM | POA: Diagnosis present

## 2014-07-12 DIAGNOSIS — Z981 Arthrodesis status: Secondary | ICD-10-CM

## 2014-07-12 DIAGNOSIS — F329 Major depressive disorder, single episode, unspecified: Secondary | ICD-10-CM | POA: Diagnosis present

## 2014-07-12 DIAGNOSIS — E1122 Type 2 diabetes mellitus with diabetic chronic kidney disease: Secondary | ICD-10-CM | POA: Diagnosis present

## 2014-07-12 DIAGNOSIS — E1165 Type 2 diabetes mellitus with hyperglycemia: Secondary | ICD-10-CM | POA: Diagnosis present

## 2014-07-12 DIAGNOSIS — I5032 Chronic diastolic (congestive) heart failure: Secondary | ICD-10-CM | POA: Diagnosis present

## 2014-07-12 DIAGNOSIS — R079 Chest pain, unspecified: Secondary | ICD-10-CM

## 2014-07-12 DIAGNOSIS — R0602 Shortness of breath: Secondary | ICD-10-CM | POA: Diagnosis present

## 2014-07-12 DIAGNOSIS — M1990 Unspecified osteoarthritis, unspecified site: Secondary | ICD-10-CM | POA: Diagnosis present

## 2014-07-12 DIAGNOSIS — B9620 Unspecified Escherichia coli [E. coli] as the cause of diseases classified elsewhere: Secondary | ICD-10-CM | POA: Diagnosis present

## 2014-07-12 DIAGNOSIS — G2581 Restless legs syndrome: Secondary | ICD-10-CM | POA: Diagnosis present

## 2014-07-12 DIAGNOSIS — I10 Essential (primary) hypertension: Secondary | ICD-10-CM | POA: Diagnosis present

## 2014-07-12 DIAGNOSIS — Z955 Presence of coronary angioplasty implant and graft: Secondary | ICD-10-CM | POA: Diagnosis not present

## 2014-07-12 DIAGNOSIS — D631 Anemia in chronic kidney disease: Secondary | ICD-10-CM | POA: Diagnosis present

## 2014-07-12 DIAGNOSIS — G4733 Obstructive sleep apnea (adult) (pediatric): Secondary | ICD-10-CM | POA: Diagnosis present

## 2014-07-12 DIAGNOSIS — N183 Chronic kidney disease, stage 3 unspecified: Secondary | ICD-10-CM | POA: Diagnosis present

## 2014-07-12 DIAGNOSIS — E1140 Type 2 diabetes mellitus with diabetic neuropathy, unspecified: Secondary | ICD-10-CM | POA: Diagnosis present

## 2014-07-12 DIAGNOSIS — R7989 Other specified abnormal findings of blood chemistry: Secondary | ICD-10-CM

## 2014-07-12 DIAGNOSIS — D649 Anemia, unspecified: Secondary | ICD-10-CM | POA: Diagnosis present

## 2014-07-12 DIAGNOSIS — E118 Type 2 diabetes mellitus with unspecified complications: Secondary | ICD-10-CM | POA: Diagnosis present

## 2014-07-12 LAB — CBC
HCT: 31.5 % — ABNORMAL LOW (ref 36.0–46.0)
Hemoglobin: 9.9 g/dL — ABNORMAL LOW (ref 12.0–15.0)
MCH: 26.2 pg (ref 26.0–34.0)
MCHC: 31.4 g/dL (ref 30.0–36.0)
MCV: 83.3 fL (ref 78.0–100.0)
Platelets: 195 10*3/uL (ref 150–400)
RBC: 3.78 MIL/uL — AB (ref 3.87–5.11)
RDW: 16.5 % — ABNORMAL HIGH (ref 11.5–15.5)
WBC: 8.9 10*3/uL (ref 4.0–10.5)

## 2014-07-12 LAB — BASIC METABOLIC PANEL
Anion gap: 16 — ABNORMAL HIGH (ref 5–15)
BUN: 27 mg/dL — ABNORMAL HIGH (ref 6–23)
CALCIUM: 8.8 mg/dL (ref 8.4–10.5)
CHLORIDE: 98 meq/L (ref 96–112)
CO2: 25 meq/L (ref 19–32)
CREATININE: 1.57 mg/dL — AB (ref 0.50–1.10)
GFR calc non Af Amer: 33 mL/min — ABNORMAL LOW (ref >90)
GFR, EST AFRICAN AMERICAN: 39 mL/min — AB (ref >90)
Glucose, Bld: 249 mg/dL — ABNORMAL HIGH (ref 70–99)
Potassium: 4.9 mEq/L (ref 3.7–5.3)
SODIUM: 139 meq/L (ref 137–147)

## 2014-07-12 LAB — URINALYSIS, ROUTINE W REFLEX MICROSCOPIC
Bilirubin Urine: NEGATIVE
GLUCOSE, UA: NEGATIVE mg/dL
HGB URINE DIPSTICK: NEGATIVE
Ketones, ur: NEGATIVE mg/dL
Leukocytes, UA: NEGATIVE
Nitrite: NEGATIVE
PH: 5.5 (ref 5.0–8.0)
PROTEIN: NEGATIVE mg/dL
Specific Gravity, Urine: 1.012 (ref 1.005–1.030)
Urobilinogen, UA: 1 mg/dL (ref 0.0–1.0)

## 2014-07-12 LAB — I-STAT TROPONIN, ED: Troponin i, poc: 0.03 ng/mL (ref 0.00–0.08)

## 2014-07-12 LAB — I-STAT CG4 LACTIC ACID, ED: Lactic Acid, Venous: 3.17 mmol/L — ABNORMAL HIGH (ref 0.5–2.2)

## 2014-07-12 LAB — PRO B NATRIURETIC PEPTIDE: PRO B NATRI PEPTIDE: 3324 pg/mL — AB (ref 0–125)

## 2014-07-12 MED ORDER — DEXTROSE 5 % IV SOLN
500.0000 mg | Freq: Once | INTRAVENOUS | Status: AC
  Administered 2014-07-12: 500 mg via INTRAVENOUS
  Filled 2014-07-12: qty 500

## 2014-07-12 MED ORDER — DEXTROSE 5 % IV SOLN
1.0000 g | Freq: Once | INTRAVENOUS | Status: AC
  Administered 2014-07-12: 1 g via INTRAVENOUS
  Filled 2014-07-12: qty 10

## 2014-07-12 MED ORDER — ACETAMINOPHEN 325 MG PO TABS
650.0000 mg | ORAL_TABLET | Freq: Four times a day (QID) | ORAL | Status: DC | PRN
  Administered 2014-07-12 – 2014-07-18 (×6): 650 mg via ORAL
  Filled 2014-07-12 (×6): qty 2

## 2014-07-12 NOTE — ED Notes (Signed)
EDP at bedside  

## 2014-07-12 NOTE — ED Notes (Signed)
Pt arrives via EMS, pt c/o chest pain, shortness of breath for the last few days. Pt hot to the touch. Pain doesn't increase with palpitation. Cough present.

## 2014-07-12 NOTE — ED Notes (Signed)
Patient transported to X-ray 

## 2014-07-12 NOTE — ED Provider Notes (Signed)
Complains of worsening shortness of breath onset today accompanied by nonproductive cough and fever.. No treatment prior to coming here. On exam patient is alert nontoxic speaks in sentences lungs scant diffuse rails heart regular rate and rhythm to abdomen morbidly obese nontender extremities with 3+ pretibial pitting edema I laterally. Chest x-ray viewed by me. Medical decision making patient has element of congestive heart failure which is chronic. We'll treat for community-acquired pneumonia given cough and fever  Doug SouSam Daiki Dicostanzo, MD 07/12/14 2211

## 2014-07-12 NOTE — ED Notes (Signed)
i-stat CG4  Lactic acid result given to Dr. Ethelda ChickJacubowitz

## 2014-07-12 NOTE — ED Provider Notes (Signed)
CSN: [id number]     Arrival date & time 07/12/14  1949 History   First MD Initiated Contact with Patient 07/12/14 2019     Chief Complaint  Patient presents with  . Fever  . Chest Pain     (Consider location/radiation/quality/duration/timing/severity/associated sxs/prior Treatment) Patient is a 68 y.o. female presenting with cough. The history is provided by the patient.  Cough Cough characteristics:  Non-productive Severity:  Moderate Onset quality:  Gradual Duration:  1 day Timing:  Constant Progression:  Worsening Chronicity:  New Smoker: no   Relieved by:  Nothing Worsened by:  Nothing tried Ineffective treatments:  None tried Associated symptoms: chest pain (patient reports CP earlier in day but denies any CP on arrival to the ED.), fever and shortness of breath   Associated symptoms: no chills, no diaphoresis, no headaches, no myalgias, no rash, no rhinorrhea, no sore throat and no wheezing     Past Medical History  Diagnosis Date  . Hypertension   . Depression   . Diabetes mellitus   . GERD (gastroesophageal reflux disease)   . Obesity   . Coronary artery disease 08/2006    Unstable angina. PCI to distal codominant CFX with 2.5 x 20 Taxus DES...  . CHF (congestive heart failure)   . Sleep apnea     stopped cpap  . Arthritis   . Headache(784.0)   . Hx of echocardiogram     Echo (11/15):  Mild LVH, EF 55-60%, no RWMA, Gr 1 DD, mild AI, mild MR, mod LAE.    Past Surgical History  Procedure Laterality Date  . Coronary stent placement  08/2006    CAD; stent placed @ [facility]  . Total abdominal hysterectomy    . Cardiac catheterization      x2            mcclean  . Lumbar fusion  02/02/2013  . Back surgery    . Right heart catheterization N/A 06/18/2014    Procedure: RIGHT HEART CATH;  Surgeon: Laurey Morale, MD;  Location: [facility] CATH LAB;  Service: Cardiovascular;  Laterality: N/A;   Family History  Problem Relation Age of Onset  . Heart attack Father    . Heart failure Father   . Anemia Mother   . Hypertension Mother    History  Substance Use Topics  . Smoking status: Never Smoker   . Smokeless tobacco: Never Used  . Alcohol Use: No   OB History    No data available     Review of Systems  Constitutional: Positive for fever. Negative for chills, diaphoresis, activity change, appetite change and fatigue.  HENT: Negative for facial swelling, rhinorrhea, sore throat, trouble swallowing and voice change.   Eyes: Negative for photophobia, pain and visual disturbance.  Respiratory: Positive for cough and shortness of breath. Negative for wheezing and stridor.   Cardiovascular: Positive for chest pain (patient reports CP earlier in day but denies any CP on arrival to the ED.) and leg swelling. Negative for palpitations.  Gastrointestinal: Negative for nausea, vomiting, abdominal pain, constipation and anal bleeding.  Endocrine: Negative.   Genitourinary: Negative for dysuria, vaginal bleeding, vaginal discharge and vaginal pain.  Musculoskeletal: Negative for myalgias, back pain and arthralgias.  Skin: Negative.  Negative for rash.  Allergic/Immunologic: Negative.   Neurological: Negative for dizziness, tremors, syncope, weakness and headaches.  Psychiatric/Behavioral: Negative for suicidal ideas, sleep disturbance and self-injury.  All other systems reviewed and are negative.     Allergies  Review  of patient's allergies indicates no known allergies.  Home Medications   Prior to Admission medications   Medication Sig Start Date End Date Taking? Authorizing Provider  albuterol (PROVENTIL HFA;VENTOLIN HFA) 108 (90 BASE) MCG/ACT inhaler Inhale 1 puff into the lungs every 6 (six) hours as needed for wheezing or shortness of breath.    Yes Historical Provider, MD  amLODipine (NORVASC) 2.5 MG tablet Take 1 tablet (2.5 mg total) by mouth daily. 07/08/13  Yes Kathlen ModyVijaya Akula, MD  aspirin EC 81 MG tablet Take 1 tablet (81 mg total) by mouth  daily. 03/19/13  Yes Laurey Moralealton S McLean, MD  atorvastatin (LIPITOR) 40 MG tablet Take 40 mg by mouth at bedtime.    Yes Historical Provider, MD  Cholecalciferol (VITAMIN D3) 1000 UNITS CAPS Take 1,000 Units by mouth daily.   Yes Historical Provider, MD  DULoxetine (CYMBALTA) 60 MG capsule Take 60 mg by mouth daily.   Yes Historical Provider, MD  ferrous sulfate 325 (65 FE) MG tablet Take 1 tablet (325 mg total) by mouth 2 (two) times daily. Patient taking differently: Take 325 mg by mouth daily with breakfast.  02/12/13  Yes Sharee Holstereborah S Green, NP  gabapentin (NEURONTIN) 400 MG capsule Take 400 mg by mouth 2 (two) times daily.   Yes Historical Provider, MD  ibuprofen (ADVIL,MOTRIN) 200 MG tablet Take 400 mg by mouth every 6 (six) hours as needed (pain).   Yes Historical Provider, MD  insulin aspart (NOVOLOG) 100 UNIT/ML injection Inject 5 Units into the skin 3 (three) times daily before meals. For cbg >=150 02/15/13  Yes Sharee Holstereborah S Green, NP  insulin glargine (LANTUS) 100 UNIT/ML injection Inject 45 Units into the skin at bedtime.   Yes Historical Provider, MD  lisinopril (PRINIVIL,ZESTRIL) 20 MG tablet Take 20 mg by mouth daily.   Yes Historical Provider, MD  metFORMIN (GLUCOPHAGE) 1000 MG tablet Take 1,000 mg by mouth 2 (two) times daily with a meal.   Yes Historical Provider, MD  metolazone (ZAROXOLYN) 2.5 MG tablet Take 2.5 mg by mouth daily.   Yes Historical Provider, MD  Multiple Vitamin (MULTIVITAMIN WITH MINERALS) TABS Take 1 tablet by mouth daily.   Yes Historical Provider, MD  oxybutynin (DITROPAN-XL) 10 MG 24 hr tablet Take 10 mg by mouth every morning.   Yes Historical Provider, MD  pregabalin (LYRICA) 75 MG capsule Take 75 mg by mouth 2 (two) times daily.   Yes Historical Provider, MD  rOPINIRole (REQUIP) 2 MG tablet Take 2 mg by mouth at bedtime.    Yes Historical Provider, MD  torsemide (DEMADEX) 20 MG tablet Take 3 tablets (60 mg total) by mouth daily. 06/18/14  Yes Laurey Moralealton S McLean, MD   traZODone (DESYREL) 50 MG tablet Take 75 mg by mouth at bedtime. For sleep.   Yes Historical Provider, MD  pantoprazole (PROTONIX) 40 MG tablet Take 40 mg by mouth daily.    Historical Provider, MD   BP 122/64 mmHg  Pulse 83  Temp(Src) 98.2 F (36.8 C) (Oral)  Resp 18  Ht 5\' 6"  (1.676 m)  Wt 213 lb (96.616 kg)  BMI 34.40 kg/m2  SpO2 93% Physical Exam  Constitutional: [name] oriented to person, place, and time. She appears well-developed and well-nourished. No distress.  HENT:  Head: Normocephalic and atraumatic.  Right Ear: External ear normal.  Left Ear: External ear normal.  Mouth/Throat: Oropharynx is clear and moist. No oropharyngeal exudate.  Eyes: Conjunctivae and EOM are normal. Pupils are equal, round, and reactive to light.  No scleral icterus.  Neck: Normal range of motion. Neck supple. No JVD present. No tracheal deviation present. No thyromegaly present.  Cardiovascular: Normal rate, regular rhythm and intact distal pulses.  Exam reveals no gallop and no friction rub.   No murmur heard. Pulmonary/Chest: Effort normal and breath sounds normal. No respiratory distress. She has no wheezes.  Crackles in bilateral lung bases  Abdominal: Soft. Bowel sounds are normal. She exhibits no distension. There is no tenderness.  Musculoskeletal: Normal range of motion. She exhibits edema (3+ edema in BLE). She exhibits no tenderness.  Neurological: [name] alert and oriented to person, place, and time. No cranial nerve deficit. She exhibits normal muscle tone. Coordination normal.  Skin: Skin is warm and dry. [name] not diaphoretic. No pallor.  Psychiatric: She has a normal mood and affect. She expresses no homicidal and no suicidal ideation. She expresses no suicidal plans and no homicidal plans.  Nursing note and vitals reviewed.   ED Course  Procedures (including critical care time) Labs Review Labs Reviewed  CBC - Abnormal; Notable for the following:    RBC 3.78 (*)     Hemoglobin 9.9 (*)    HCT 31.5 (*)    RDW 16.5 (*)    All other components within normal limits  BASIC METABOLIC PANEL - Abnormal; Notable for the following:    Glucose, Bld 249 (*)    BUN 27 (*)    Creatinine, Ser 1.57 (*)    GFR calc non Af Amer 33 (*)    GFR calc Af Amer 39 (*)    Anion gap 16 (*)    All other components within normal limits  PRO B NATRIURETIC PEPTIDE - Abnormal; Notable for the following:    Pro B Natriuretic peptide (BNP) 3324.0 (*)    All other components within normal limits  I-STAT CG4 LACTIC ACID, ED - Abnormal; Notable for the following:    Lactic Acid, Venous 3.17 (*)    All other components within normal limits  CULTURE, BLOOD (ROUTINE X 2)  CULTURE, BLOOD (ROUTINE X 2)  URINE CULTURE  URINALYSIS, ROUTINE W REFLEX MICROSCOPIC  I-STAT TROPOININ, ED    Imaging Review Dg Chest 2 View  07/12/2014   CLINICAL DATA:  Short of breath, chest tightness, fever, cough  EXAM: CHEST  2 VIEW  COMPARISON:  Radiograph 06/03/2014  FINDINGS: Patient rotated. Stable cardiac silhouette. There is central venous congestion similar prior. Prominence the pulmonary arteries. Mild interstitial edema pattern The lung bases are poorly evaluated. No pleural fluid on lateral projection.  IMPRESSION: Cardiomegaly and interstitial edema. No convincing evidence pneumonia.   Electronically Signed   By: Genevive Bi M.D.   On: 07/12/2014 21:41     EKG Interpretation   Date/Time:  Saturday July 12 2014 20:07:28 EST Ventricular Rate:  88 PR Interval:  144 QRS Duration: 109 QT Interval:  390 QTC Calculation: 472 R Axis:   -32 Text Interpretation:  Sinus rhythm Left axis deviation Repol abnrm  suggests ischemia, lateral leads Confirmed by Ethelda Chick  MD, SAM [phone])  on 07/12/2014 10:12:30 PM      MDM   Final diagnoses:  Chest pain    Pt is a 68 y.o. F with CHF and DM who presents with 1 day of SOB, cough, and fevers. O2 requirement of 2L in ED (no O2 at home).  Patient treated clinically for pneumonia with azith and rocephin and admitted to hospitalist service for further care. Patient expresses understanding and agreement with  this plan.  Patient seen with attending, Dr. Ethelda ChickJacubowitz, who oversaw clinical decision making.     Lula OlszewskiMike Zamyiah Tino, MD 07/12/14 [phone]  Doug SouSam Jacubowitz, MD 07/12/14 2352

## 2014-07-13 ENCOUNTER — Encounter (HOSPITAL_COMMUNITY): Payer: Self-pay

## 2014-07-13 DIAGNOSIS — N183 Chronic kidney disease, stage 3 (moderate): Secondary | ICD-10-CM

## 2014-07-13 DIAGNOSIS — N179 Acute kidney failure, unspecified: Secondary | ICD-10-CM

## 2014-07-13 DIAGNOSIS — I10 Essential (primary) hypertension: Secondary | ICD-10-CM

## 2014-07-13 DIAGNOSIS — G2581 Restless legs syndrome: Secondary | ICD-10-CM

## 2014-07-13 DIAGNOSIS — I5033 Acute on chronic diastolic (congestive) heart failure: Secondary | ICD-10-CM | POA: Insufficient documentation

## 2014-07-13 DIAGNOSIS — I270 Primary pulmonary hypertension: Secondary | ICD-10-CM

## 2014-07-13 DIAGNOSIS — E118 Type 2 diabetes mellitus with unspecified complications: Secondary | ICD-10-CM

## 2014-07-13 DIAGNOSIS — R7989 Other specified abnormal findings of blood chemistry: Secondary | ICD-10-CM

## 2014-07-13 DIAGNOSIS — J9601 Acute respiratory failure with hypoxia: Secondary | ICD-10-CM | POA: Diagnosis present

## 2014-07-13 DIAGNOSIS — R778 Other specified abnormalities of plasma proteins: Secondary | ICD-10-CM | POA: Insufficient documentation

## 2014-07-13 DIAGNOSIS — J189 Pneumonia, unspecified organism: Secondary | ICD-10-CM

## 2014-07-13 DIAGNOSIS — G4733 Obstructive sleep apnea (adult) (pediatric): Secondary | ICD-10-CM

## 2014-07-13 DIAGNOSIS — I5032 Chronic diastolic (congestive) heart failure: Secondary | ICD-10-CM

## 2014-07-13 DIAGNOSIS — D649 Anemia, unspecified: Secondary | ICD-10-CM

## 2014-07-13 LAB — BLOOD GAS, ARTERIAL
ACID-BASE EXCESS: 1.6 mmol/L (ref 0.0–2.0)
BICARBONATE: 26.8 meq/L — AB (ref 20.0–24.0)
Drawn by: 28701
O2 Content: 3 L/min
O2 SAT: 95.5 %
PCO2 ART: 50.2 mmHg — AB (ref 35.0–45.0)
PO2 ART: 76.2 mmHg — AB (ref 80.0–100.0)
Patient temperature: 97.6
TCO2: 28.4 mmol/L (ref 0–100)
pH, Arterial: 7.344 — ABNORMAL LOW (ref 7.350–7.450)

## 2014-07-13 LAB — CBC WITH DIFFERENTIAL/PLATELET
Basophils Absolute: 0 10*3/uL (ref 0.0–0.1)
Basophils Relative: 0 % (ref 0–1)
Eosinophils Absolute: 0.2 10*3/uL (ref 0.0–0.7)
Eosinophils Relative: 2 % (ref 0–5)
HCT: 30.1 % — ABNORMAL LOW (ref 36.0–46.0)
HEMOGLOBIN: 9.2 g/dL — AB (ref 12.0–15.0)
LYMPHS PCT: 11 % — AB (ref 12–46)
Lymphs Abs: 0.8 10*3/uL (ref 0.7–4.0)
MCH: 25.9 pg — ABNORMAL LOW (ref 26.0–34.0)
MCHC: 30.6 g/dL (ref 30.0–36.0)
MCV: 84.8 fL (ref 78.0–100.0)
MONOS PCT: 10 % (ref 3–12)
Monocytes Absolute: 0.8 10*3/uL (ref 0.1–1.0)
Neutro Abs: 5.8 10*3/uL (ref 1.7–7.7)
Neutrophils Relative %: 77 % (ref 43–77)
PLATELETS: 193 10*3/uL (ref 150–400)
RBC: 3.55 MIL/uL — AB (ref 3.87–5.11)
RDW: 16.8 % — ABNORMAL HIGH (ref 11.5–15.5)
WBC: 7.6 10*3/uL (ref 4.0–10.5)

## 2014-07-13 LAB — IRON AND TIBC
Iron: 38 ug/dL — ABNORMAL LOW (ref 42–135)
Saturation Ratios: 15 % — ABNORMAL LOW (ref 20–55)
TIBC: 249 ug/dL — ABNORMAL LOW (ref 250–470)
UIBC: 211 ug/dL (ref 125–400)

## 2014-07-13 LAB — RETICULOCYTES
RBC.: 3.41 MIL/uL — ABNORMAL LOW (ref 3.87–5.11)
RETIC CT PCT: 3.2 % — AB (ref 0.4–3.1)
Retic Count, Absolute: 109.1 10*3/uL (ref 19.0–186.0)

## 2014-07-13 LAB — GLUCOSE, CAPILLARY
GLUCOSE-CAPILLARY: 161 mg/dL — AB (ref 70–99)
GLUCOSE-CAPILLARY: 245 mg/dL — AB (ref 70–99)
GLUCOSE-CAPILLARY: 251 mg/dL — AB (ref 70–99)
Glucose-Capillary: 150 mg/dL — ABNORMAL HIGH (ref 70–99)
Glucose-Capillary: 384 mg/dL — ABNORMAL HIGH (ref 70–99)

## 2014-07-13 LAB — COMPREHENSIVE METABOLIC PANEL
ALBUMIN: 2.8 g/dL — AB (ref 3.5–5.2)
ALK PHOS: 64 U/L (ref 39–117)
ALT: 9 U/L (ref 0–35)
AST: 12 U/L (ref 0–37)
Anion gap: 13 (ref 5–15)
BILIRUBIN TOTAL: 0.3 mg/dL (ref 0.3–1.2)
BUN: 28 mg/dL — ABNORMAL HIGH (ref 6–23)
CHLORIDE: 101 meq/L (ref 96–112)
CO2: 27 meq/L (ref 19–32)
Calcium: 8.7 mg/dL (ref 8.4–10.5)
Creatinine, Ser: 1.81 mg/dL — ABNORMAL HIGH (ref 0.50–1.10)
GFR calc Af Amer: 32 mL/min — ABNORMAL LOW (ref >90)
GFR, EST NON AFRICAN AMERICAN: 28 mL/min — AB (ref >90)
Glucose, Bld: 171 mg/dL — ABNORMAL HIGH (ref 70–99)
POTASSIUM: 4.6 meq/L (ref 3.7–5.3)
Sodium: 141 mEq/L (ref 137–147)
Total Protein: 6.4 g/dL (ref 6.0–8.3)

## 2014-07-13 LAB — FERRITIN: Ferritin: 51 ng/mL (ref 10–291)

## 2014-07-13 LAB — TROPONIN I
TROPONIN I: 0.46 ng/mL — AB (ref <0.30)
Troponin I: 0.49 ng/mL (ref <0.30)

## 2014-07-13 LAB — PROTIME-INR
INR: 1.15 (ref 0.00–1.49)
Prothrombin Time: 14.8 seconds (ref 11.6–15.2)

## 2014-07-13 LAB — STREP PNEUMONIAE URINARY ANTIGEN: Strep Pneumo Urinary Antigen: NEGATIVE

## 2014-07-13 LAB — LACTATE DEHYDROGENASE: LDH: 201 U/L (ref 94–250)

## 2014-07-13 MED ORDER — IPRATROPIUM-ALBUTEROL 0.5-2.5 (3) MG/3ML IN SOLN
3.0000 mL | RESPIRATORY_TRACT | Status: DC
  Administered 2014-07-13 – 2014-07-14 (×8): 3 mL via RESPIRATORY_TRACT
  Filled 2014-07-13 (×7): qty 3

## 2014-07-13 MED ORDER — PREGABALIN 25 MG PO CAPS
75.0000 mg | ORAL_CAPSULE | Freq: Two times a day (BID) | ORAL | Status: DC
  Administered 2014-07-13 – 2014-07-16 (×8): 75 mg via ORAL
  Filled 2014-07-13 (×9): qty 3

## 2014-07-13 MED ORDER — DULOXETINE HCL 60 MG PO CPEP
60.0000 mg | ORAL_CAPSULE | Freq: Every day | ORAL | Status: DC
  Administered 2014-07-13 – 2014-07-19 (×7): 60 mg via ORAL
  Filled 2014-07-13 (×7): qty 1

## 2014-07-13 MED ORDER — INSULIN ASPART 100 UNIT/ML ~~LOC~~ SOLN
0.0000 [IU] | Freq: Four times a day (QID) | SUBCUTANEOUS | Status: DC
Start: 2014-07-13 — End: 2014-07-17
  Administered 2014-07-13: 5 [IU] via SUBCUTANEOUS
  Administered 2014-07-13: 3 [IU] via SUBCUTANEOUS
  Administered 2014-07-13: 8 [IU] via SUBCUTANEOUS
  Administered 2014-07-13: 2 [IU] via SUBCUTANEOUS
  Administered 2014-07-14 (×2): 11 [IU] via SUBCUTANEOUS
  Administered 2014-07-14: 3 [IU] via SUBCUTANEOUS
  Administered 2014-07-14: 5 [IU] via SUBCUTANEOUS
  Administered 2014-07-14: 3 [IU] via SUBCUTANEOUS
  Administered 2014-07-15: 5 [IU] via SUBCUTANEOUS
  Administered 2014-07-15: 3 [IU] via SUBCUTANEOUS
  Administered 2014-07-15: 5 [IU] via SUBCUTANEOUS
  Administered 2014-07-16: 8 [IU] via SUBCUTANEOUS
  Administered 2014-07-16: 5 [IU] via SUBCUTANEOUS
  Administered 2014-07-16: 8 [IU] via SUBCUTANEOUS

## 2014-07-13 MED ORDER — GABAPENTIN 400 MG PO CAPS
400.0000 mg | ORAL_CAPSULE | Freq: Two times a day (BID) | ORAL | Status: DC
  Administered 2014-07-13 – 2014-07-16 (×8): 400 mg via ORAL
  Filled 2014-07-13 (×9): qty 1

## 2014-07-13 MED ORDER — FUROSEMIDE 10 MG/ML IJ SOLN
60.0000 mg | Freq: Every day | INTRAMUSCULAR | Status: DC
  Administered 2014-07-13 – 2014-07-14 (×2): 60 mg via INTRAVENOUS
  Filled 2014-07-13 (×3): qty 6

## 2014-07-13 MED ORDER — LEVOFLOXACIN IN D5W 750 MG/150ML IV SOLN
750.0000 mg | INTRAVENOUS | Status: AC
Start: 2014-07-13 — End: 2014-07-17
  Administered 2014-07-13 – 2014-07-16 (×3): 750 mg via INTRAVENOUS
  Filled 2014-07-13 (×3): qty 150

## 2014-07-13 MED ORDER — INSULIN GLARGINE 100 UNIT/ML ~~LOC~~ SOLN
45.0000 [IU] | Freq: Every day | SUBCUTANEOUS | Status: DC
  Administered 2014-07-13 – 2014-07-15 (×3): 45 [IU] via SUBCUTANEOUS
  Filled 2014-07-13 (×4): qty 0.45

## 2014-07-13 MED ORDER — HEPARIN SODIUM (PORCINE) 5000 UNIT/ML IJ SOLN
5000.0000 [IU] | Freq: Three times a day (TID) | INTRAMUSCULAR | Status: DC
  Administered 2014-07-13: 5000 [IU] via SUBCUTANEOUS
  Filled 2014-07-13 (×4): qty 1

## 2014-07-13 MED ORDER — ATORVASTATIN CALCIUM 40 MG PO TABS
40.0000 mg | ORAL_TABLET | Freq: Every day | ORAL | Status: DC
  Administered 2014-07-13 – 2014-07-18 (×7): 40 mg via ORAL
  Filled 2014-07-13 (×9): qty 1

## 2014-07-13 MED ORDER — ROPINIROLE HCL 1 MG PO TABS
2.0000 mg | ORAL_TABLET | Freq: Every day | ORAL | Status: DC
  Administered 2014-07-13 – 2014-07-18 (×6): 2 mg via ORAL
  Filled 2014-07-13 (×7): qty 2

## 2014-07-13 MED ORDER — OXYBUTYNIN CHLORIDE ER 10 MG PO TB24
10.0000 mg | ORAL_TABLET | Freq: Every morning | ORAL | Status: DC
  Administered 2014-07-13 – 2014-07-19 (×7): 10 mg via ORAL
  Filled 2014-07-13 (×7): qty 1

## 2014-07-13 MED ORDER — ASPIRIN EC 81 MG PO TBEC
81.0000 mg | DELAYED_RELEASE_TABLET | Freq: Every day | ORAL | Status: DC
  Administered 2014-07-13 – 2014-07-19 (×7): 81 mg via ORAL
  Filled 2014-07-13 (×7): qty 1

## 2014-07-13 MED ORDER — FERROUS SULFATE 325 (65 FE) MG PO TABS
325.0000 mg | ORAL_TABLET | Freq: Every day | ORAL | Status: DC
  Administered 2014-07-13 – 2014-07-14 (×2): 325 mg via ORAL
  Filled 2014-07-13 (×3): qty 1

## 2014-07-13 MED ORDER — ALBUTEROL SULFATE (2.5 MG/3ML) 0.083% IN NEBU
3.0000 mL | INHALATION_SOLUTION | Freq: Four times a day (QID) | RESPIRATORY_TRACT | Status: DC | PRN
  Filled 2014-07-13: qty 3

## 2014-07-13 MED ORDER — TRAZODONE HCL 50 MG PO TABS
75.0000 mg | ORAL_TABLET | Freq: Every day | ORAL | Status: DC
  Administered 2014-07-13 – 2014-07-18 (×7): 75 mg via ORAL
  Filled 2014-07-13 (×9): qty 1

## 2014-07-13 NOTE — Progress Notes (Signed)
Patient ID: Krista Wise MRN: [id number] DOB/AGE: 66-Jun-1947 66 y.o.   Referring Physician Dr. Allena Katz   Reason for Consultation : [facility]  HPI: 68 y/o who has had diastolic dysfunction.  She had sudden onset of shortness of breath last night at a Christmas party. She came to the emergency room with fever and shivering as well. She was given antibiotics. Chest x-ray showed evidence of interstitial edema. There is no convincing evidence for pneumonia. She does feel much better after some diuresis. She still is requiring oxygen. She does not wear oxygen at home. She does state that she feels well enough to go home now. She denied any chest pain or pressure. She does have a history of coronary artery disease.   Current Facility-Administered Medications  Medication Dose Route Frequency Provider Last Rate Last Dose  . acetaminophen (TYLENOL) tablet 650 mg  650 mg Oral Q6H PRN Doug Sou, MD   650 mg at 07/12/14 2005  . albuterol (PROVENTIL) (2.5 MG/3ML) 0.083% nebulizer solution 3 mL  3 mL Inhalation Q6H PRN Lynden Oxford, MD      . aspirin EC tablet 81 mg  81 mg Oral Daily Lynden Oxford, MD   81 mg at 07/13/14 0945  . atorvastatin (LIPITOR) tablet 40 mg  40 mg Oral QHS Lynden Oxford, MD   40 mg at 07/13/14 0233  . DULoxetine (CYMBALTA) DR capsule 60 mg  60 mg Oral Daily Lynden Oxford, MD   60 mg at 07/13/14 0945  . ferrous sulfate tablet 325 mg  325 mg Oral Q breakfast Lynden Oxford, MD   325 mg at 07/13/14 0945  . furosemide (LASIX) injection 60 mg  60 mg Intravenous Daily Lynden Oxford, MD   60 mg at 07/13/14 0946  . gabapentin (NEURONTIN) capsule 400 mg  400 mg Oral BID Lynden Oxford, MD   400 mg at 07/13/14 0945  . insulin aspart (novoLOG) injection 0-15 Units  0-15 Units Subcutaneous 4 times per day Lynden Oxford, MD   5 Units at 07/13/14 1250  . insulin glargine (LANTUS) injection 45 Units  45 Units Subcutaneous QHS Lynden Oxford, MD      . ipratropium-albuterol (DUONEB) 0.5-2.5 (3) MG/3ML  nebulizer solution 3 mL  3 mL Nebulization Q4H Lynden Oxford, MD   3 mL at 07/13/14 1144  . levofloxacin (LEVAQUIN) IVPB 750 mg  750 mg Intravenous Q48H Lynden Oxford, MD   750 mg at 07/13/14 0233  . oxybutynin (DITROPAN-XL) 24 hr tablet 10 mg  10 mg Oral q morning - 10a Lynden Oxford, MD   10 mg at 07/13/14 0945  . pregabalin (LYRICA) capsule 75 mg  75 mg Oral BID Lynden Oxford, MD   75 mg at 07/13/14 0945  . rOPINIRole (REQUIP) tablet 2 mg  2 mg Oral QHS Lynden Oxford, MD      . traZODone (DESYREL) tablet 75 mg  75 mg Oral QHS Lynden Oxford, MD   75 mg at 07/13/14 7829   Past Medical History  Diagnosis Date  . Hypertension   . Depression   . Diabetes mellitus   . GERD (gastroesophageal reflux disease)   . Obesity   . Coronary artery disease 08/2006    Unstable angina. PCI to distal codominant CFX with 2.5 x 20 Taxus DES...  . CHF (congestive heart failure)   . Sleep apnea     stopped cpap  . Arthritis   . Headache(784.0)   . Hx of echocardiogram     Echo (11/15):  Mild LVH, EF 55-60%, no RWMA, Gr 1 DD, mild AI, mild MR, mod LAE.     Family History  Problem Relation Age of Onset  . Heart attack Father   . Heart failure Father   . Anemia Mother   . Hypertension Mother     History   Social History  . Marital Status: Married    Spouse Name: Meda KlinefelterRobert Doddridge    Number of Children: N/A  . Years of Education: N/A   Occupational History  . 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 Smoking status: Never Smoker   . Smokeless tobacco: Never Used  . Alcohol Use: No  . Drug Use: No  . Sexual Activity: Not Currently   Other Topics Concern  . Not on file   Social History Narrative   Married, lives [place] in [place]   Disabled: diabetes and fibromyalgia   No regular exercise             Past Surgical History  Procedure Laterality Date  . Coronary stent placement  08/2006    CAD; [facility]  . Total abdominal  hysterectomy    . Cardiac catheterization      x2            mcclean  . Lumbar fusion  02/02/2013  . Back surgery    . Right heart catheterization N/A 06/18/2014    Procedure: RIGHT HEART CATH;  Surgeon: Laurey Moralealton S McLean, MD;  [facility];  Service: Cardiovascular;  Laterality: N/A;     Prescriptions prior to admission  Medication Sig Dispense Refill Last Dose  . albuterol (PROVENTIL HFA;VENTOLIN HFA) 108 (90 BASE) MCG/ACT inhaler Inhale 1 puff into the lungs every 6 (six) hours as needed for wheezing or shortness of breath.    Past Month at Unknown time  . amLODipine (NORVASC) 2.5 MG tablet Take 1 tablet (2.5 mg total) by mouth daily. 15 tablet 0 07/12/2014 at Unknown time  . aspirin EC 81 MG tablet Take 1 tablet (81 mg total) by mouth daily.   07/12/2014 at Unknown time  . atorvastatin (LIPITOR) 40 MG tablet Take 40 mg by mouth at bedtime.    07/11/2014 at Unknown time  . Cholecalciferol (VITAMIN D3) 1000 UNITS CAPS Take 1,000 Units by mouth daily.   07/12/2014 at Unknown time  . DULoxetine (CYMBALTA) 60 MG capsule Take 60 mg by mouth daily.   07/12/2014 at Unknown time  . ferrous sulfate 325 (65 FE) MG tablet Take 1 tablet (325 mg total) by mouth 2 (two) times daily. (Patient taking differently: Take 325 mg by mouth daily with breakfast. ) 60 tablet 3 07/12/2014 at Unknown time  . gabapentin (NEURONTIN) 400 MG capsule Take 400 mg by mouth 2 (two) times daily.   07/12/2014 at Unknown time  . ibuprofen (ADVIL,MOTRIN) 200 MG tablet Take 400 mg by mouth every 6 (six) hours as needed (pain).   07/12/2014 at Unknown time  . insulin aspart (NOVOLOG) 100 UNIT/ML injection Inject 5 Units into the skin 3 (three) times daily before meals. For cbg >=150 3 vial PRN 07/11/2014 at Unknown time  . insulin glargine (LANTUS) 100 UNIT/ML injection Inject 45 Units into the skin at bedtime.   07/11/2014 at Unknown time  . lisinopril (PRINIVIL,ZESTRIL) 20 MG tablet Take 20 mg by mouth daily.   07/12/2014  at Unknown time  . metFORMIN (GLUCOPHAGE) 1000 MG tablet Take 1,000  mg by mouth 2 (two) times daily with a meal.   07/12/2014 at Unknown time  . metolazone (ZAROXOLYN) 2.5 MG tablet Take 2.5 mg by mouth daily.   07/12/2014 at Unknown time  . Multiple Vitamin (MULTIVITAMIN WITH MINERALS) TABS Take 1 tablet by mouth daily.   07/12/2014 at Unknown time  . oxybutynin (DITROPAN-XL) 10 MG 24 hr tablet Take 10 mg by mouth every morning.   07/12/2014 at Unknown time  . pregabalin (LYRICA) 75 MG capsule Take 75 mg by mouth 2 (two) times daily.   07/12/2014 at Unknown time  . rOPINIRole (REQUIP) 2 MG tablet Take 2 mg by mouth at bedtime.    07/11/2014 at Unknown time  . torsemide (DEMADEX) 20 MG tablet Take 3 tablets (60 mg total) by mouth daily. 90 tablet 6 07/12/2014 at Unknown time  . traZODone (DESYREL) 50 MG tablet Take 75 mg by mouth at bedtime. For sleep.   07/11/2014 at Unknown time  . pantoprazole (PROTONIX) 40 MG tablet Take 40 mg by mouth daily.   Not Taking at Unknown time    Review of systems complete and found to be negative unless listed above .  No nausea, vomiting.  No fever chills, No focal weakness,  No palpitations.  Physical Exam: Filed Vitals:   07/13/14 0416  BP: 114/52  Pulse: 82  Temp: 97 F (36.1 C)  Resp: 18    @FLOWAMB (14)@  Physical exam:  [place]/AT EOMI No JVD, No carotid bruit RRR S1S2  No wheezing, bibasilar crackles Soft. NT, nondistended Trace pretibial edema bilaterally. No focal motor or sensory deficits Normal affect  Labs:   Lab Results  Component Value Date   WBC 7.6 07/13/2014   HGB 9.2* 07/13/2014   HCT 30.1* 07/13/2014   MCV 84.8 07/13/2014   PLT 193 07/13/2014    Recent Labs Lab 07/13/14 0454  NA 141  K 4.6  CL 101  CO2 27  BUN 28*  CREATININE 1.81*  CALCIUM 8.7  PROT 6.4  BILITOT 0.3  ALKPHOS 64  ALT 9  AST 12  GLUCOSE 171*   Lab Results  Component Value Date   CKTOTAL 437* 08/31/2011   CKMB 4.1* 08/31/2011   TROPONINI  0.49* 07/13/2014    Lab Results  Component Value Date   CHOL 121 06/13/2014   CHOL 131 10/05/2011   CHOL 123 01/21/2011   Lab Results  Component Value Date   HDL 26* 06/13/2014   HDL 33.50* 10/05/2011   HDL 33.90* 01/21/2011   Lab Results  Component Value Date   LDLCALC 51 06/13/2014   LDLCALC 50 01/21/2011   Lab Results  Component Value Date   TRIG 219* 06/13/2014   TRIG 335.0* 10/05/2011   TRIG 197.0* 01/21/2011   Lab Results  Component Value Date   CHOLHDL 4.7 06/13/2014   CHOLHDL 4 10/05/2011   CHOLHDL 4 01/21/2011   Lab Results  Component Value Date   LDLDIRECT 54.5 10/05/2011   LDLDIRECT 83.7 11/23/2010   LDLDIRECT 96.5 10/20/2010      Radiology: Chest x-ray showing interstitial edema EKG: Normal sinus rhythm, inferolateral ST depressions, similar to prior ECG in November 2015 but different from 2014 ECG ASSESSMENT AND PLAN: acute on chronic diastolic heart failure, minimally elevated troponin in the setting of renal insufficiency, coronary artery disease  1) continue diuresis. Try to wean her off oxygen. Some component of acute on chronic diastolic heart failure. This could explain her minimal troponin elevation as well. 2) she may need a  cardiac cath, depending on how her troponins trend. She would have to be able to lie flat to have a heart cath. Would also have to watch renal function which is not normal at this time. She did not have typical symptoms of angina. Her history sounded more like a heart failure exacerbation. I would have a heart failure team see her in the morning. Will make nothing by mouth past midnight. They can decide as to whether cardiac cath is warranted at this time. 3) Anemia: Unclear etiology. Would like to see  Hemoccult results. Would not heparinize at this time.  She appears stable.  Signed:   Fredric Mare, MD, [facility] 07/13/2014, 1:51 PM

## 2014-07-13 NOTE — Progress Notes (Signed)
Pt complaining earlier of feeling very cold, bundled with blankets. Temp 98.8 at that time. Feels cool to the touch. C/o nausea after eating dinner, back to bed. Temp 100.3

## 2014-07-13 NOTE — Progress Notes (Signed)
Patient admitted overnight with sudden onset shortness of breath late last night and chest pain, low grade temp in the ED, an a CXR with pulmonary edema without convincing evidence of pneumonia.   - she appears fluid overloaded (crackles, wheezing, 2+ pitting edemaand her current weight is 258 lbs, up from 240 lbs in cardiology office 4 weeks ago, although there is a documented weight of 218 on 06/25/2014 and a 213 last night??? I have asked the RN to weight again this morning.   - Patient is non-compliant with her daily weights and has not weighed herself in the past month.  - [name] quite vague about fluid and salt intake. - seen recently by Dr. Shirlee LatchMcLean and had a right cath which showed mildly elevated filling pressures, primarily pulmonary venous hypertension.  - will administer Lasix IV this morning - cycled CEs given chest pain, initial troponin returned positive, will consult cardiology - she has acute on chronic renal failure, Cr up to 1.8 from ~1.6 last month, essentially stable, will closely monitor with diuresis.  - anemia - no evidence of bleeding - low suspicion for pneumonia but will keep antibiotics for now   Krista Awtrey M. Elvera LennoxGherghe, MD [facility] [phone]

## 2014-07-13 NOTE — H&P (Signed)
[facility] History and Physical  Patient: Krista Wise  ZOX:[id number]  DOB: 03-12-1946  DOS: the patient was seen and examined on 07/13/2014 PCP: [name], NP  Chief Complaint: Cough and shortness of breath  HPI: Krista Wise is a 68 y.o. female with Past medical history of coronary artery disease, chronic pulmonary hypertension with chronic diastolic dysfunction recently underwent right heart catheterization, sleep apnea not using CPAP or oxygen, diabetes mellitus type 2, GERD, morbid obesity, hypertension. Patient presents with complaints of cough shortness of breath and fever. History was obtained from patient's husband was at bedside. Husband mentions that patient was with her at the holiday party and after dinner she suddenly started having complaints of shortness of breath this was associated with some chest pain and cough. She had fever as well as shaking chills. She did not have any similar event before this. She has been having shortness of breath which is progressively worsening over last few month and has seen her cardiologist and has undergone right heart catheterization for further evaluation and was recommended to use nocturnal oxygen after obtaining oximetric. She was on Lasix and Zaroxolyn but due to increase in serum creatinine this was changed to torsemide with which her serum creatinine had improved and VOLUME status as well. He mentions she has been taking all her medications consistently and there is no other change in her medications at present.  The patient is coming from home. And at her baseline independent for most of her ADL.  Review of Systems: as mentioned in the history of present illness.  A Comprehensive review of the other systems is negative.  Past Medical History  Diagnosis Date  . Hypertension   . Depression   . Diabetes mellitus   . GERD (gastroesophageal reflux disease)   . Obesity   . Coronary artery disease 08/2006    Unstable  angina. PCI to distal codominant CFX with 2.5 x 20 Taxus DES...  . CHF (congestive heart failure)   . Sleep apnea     stopped cpap  . Arthritis   . Headache(784.0)   . Hx of echocardiogram     Echo (11/15):  Mild LVH, EF 55-60%, no RWMA, Gr 1 DD, mild AI, mild MR, mod LAE.    Past Surgical History  Procedure Laterality Date  . Coronary stent placement  08/2006    CAD; stent placed @ [facility]  . Total abdominal hysterectomy    . Cardiac catheterization      x2            mcclean  . Lumbar fusion  02/02/2013  . Back surgery    . Right heart catheterization N/A 06/18/2014    Procedure: RIGHT HEART CATH;  Surgeon: Laurey Morale, MD;  Location: [facility] CATH LAB;  Service: Cardiovascular;  Laterality: N/A;   Social History:  reports that she has never smoked. She has never used smokeless tobacco. She reports that she does not drink alcohol or use illicit drugs.  No Known Allergies  Family History  Problem Relation Age of Onset  . Heart attack Father   . Heart failure Father   . Anemia Mother   . Hypertension Mother     Prior to Admission medications   Medication Sig Start Date End Date Taking? Authorizing Provider  albuterol (PROVENTIL HFA;VENTOLIN HFA) 108 (90 BASE) MCG/ACT inhaler Inhale 1 puff into the lungs every 6 (six) hours as needed for wheezing or shortness of breath.    Yes Historical Provider, MD  amLODipine (NORVASC) 2.5 MG tablet Take 1 tablet (2.5 mg total) by mouth daily. 07/08/13  Yes Kathlen ModyVijaya Akula, MD  aspirin EC 81 MG tablet Take 1 tablet (81 mg total) by mouth daily. 03/19/13  Yes Laurey Moralealton S McLean, MD  atorvastatin (LIPITOR) 40 MG tablet Take 40 mg by mouth at bedtime.    Yes Historical Provider, MD  Cholecalciferol (VITAMIN D3) 1000 UNITS CAPS Take 1,000 Units by mouth daily.   Yes Historical Provider, MD  DULoxetine (CYMBALTA) 60 MG capsule Take 60 mg by mouth daily.   Yes Historical Provider, MD  ferrous sulfate 325 (65 FE) MG tablet Take 1 tablet (325 mg total) by  mouth 2 (two) times daily. Patient taking differently: Take 325 mg by mouth daily with breakfast.  02/12/13  Yes Sharee Holstereborah S Green, NP  gabapentin (NEURONTIN) 400 MG capsule Take 400 mg by mouth 2 (two) times daily.   Yes Historical Provider, MD  ibuprofen (ADVIL,MOTRIN) 200 MG tablet Take 400 mg by mouth every 6 (six) hours as needed (pain).   Yes Historical Provider, MD  insulin aspart (NOVOLOG) 100 UNIT/ML injection Inject 5 Units into the skin 3 (three) times daily before meals. For cbg >=150 02/15/13  Yes Sharee Holstereborah S Green, NP  insulin glargine (LANTUS) 100 UNIT/ML injection Inject 45 Units into the skin at bedtime.   Yes Historical Provider, MD  lisinopril (PRINIVIL,ZESTRIL) 20 MG tablet Take 20 mg by mouth daily.   Yes Historical Provider, MD  metFORMIN (GLUCOPHAGE) 1000 MG tablet Take 1,000 mg by mouth 2 (two) times daily with a meal.   Yes Historical Provider, MD  metolazone (ZAROXOLYN) 2.5 MG tablet Take 2.5 mg by mouth daily.   Yes Historical Provider, MD  Multiple Vitamin (MULTIVITAMIN WITH MINERALS) TABS Take 1 tablet by mouth daily.   Yes Historical Provider, MD  oxybutynin (DITROPAN-XL) 10 MG 24 hr tablet Take 10 mg by mouth every morning.   Yes Historical Provider, MD  pregabalin (LYRICA) 75 MG capsule Take 75 mg by mouth 2 (two) times daily.   Yes Historical Provider, MD  rOPINIRole (REQUIP) 2 MG tablet Take 2 mg by mouth at bedtime.    Yes Historical Provider, MD  torsemide (DEMADEX) 20 MG tablet Take 3 tablets (60 mg total) by mouth daily. 06/18/14  Yes Laurey Moralealton S McLean, MD  traZODone (DESYREL) 50 MG tablet Take 75 mg by mouth at bedtime. For sleep.   Yes Historical Provider, MD  pantoprazole (PROTONIX) 40 MG tablet Take 40 mg by mouth daily.    Historical Provider, MD    Physical Exam: Filed Vitals:   07/13/14 0034 07/13/14 0136 07/13/14 0255 07/13/14 0416  BP: 116/85   114/52  Pulse: 83   82  Temp: 97.9 F (36.6 C)   97 F (36.1 C)  TempSrc: Oral   Axillary  Resp: 18   18   Height: 5\' 6"  (1.676 m)     Weight: 117.4 kg (258 lb 13.1 oz)     SpO2: 90% 95% 95% 94%    General: Alert, Awake and Oriented to Time, Place and Person. Appear in moderate distress Eyes: PERRL ENT: Oral Mucosa clear moist. Neck: no JVD Cardiovascular: S1 and S2 Present, no Murmur, Peripheral Pulses Present Respiratory: Bilateral Air entry equal and Decreased, bilateral Crackles, bilateral expiratory wheezes Abdomen: Bowel Sound present sluggish, Soft and non tender Skin: no Rash Extremities: Bilateral Pedal edema, no calf tenderness Neurologic: Grossly no focal neuro deficit other than generalized lethargy  Labs on Admission:  CBC:  Recent Labs Lab 07/12/14 2000 07/13/14 0454  WBC 8.9 7.6  NEUTROABS  --  5.8  HGB 9.9* 9.2*  HCT 31.5* 30.1*  MCV 83.3 84.8  PLT 195 193    CMP     Component Value Date/Time   NA 141 07/13/2014 0454   K 4.6 07/13/2014 0454   CL 101 07/13/2014 0454   CO2 27 07/13/2014 0454   GLUCOSE 171* 07/13/2014 0454   BUN 28* 07/13/2014 0454   CREATININE 1.81* 07/13/2014 0454   CREATININE 2.38* 06/13/2014 1648   CALCIUM 8.7 07/13/2014 0454   PROT 6.4 07/13/2014 0454   ALBUMIN 2.8* 07/13/2014 0454   AST 12 07/13/2014 0454   ALT 9 07/13/2014 0454   ALKPHOS 64 07/13/2014 0454   BILITOT 0.3 07/13/2014 0454   GFRNONAA 28* 07/13/2014 0454   GFRAA 32* 07/13/2014 0454    No results for input(s): LIPASE, AMYLASE in the last 168 hours. No results for input(s): AMMONIA in the last 168 hours.  No results for input(s): CKTOTAL, CKMB, CKMBINDEX, TROPONINI in the last 168 hours. BNP (last 3 results)  Recent Labs  06/03/14 1504 06/24/14 1640 07/12/14 2000  PROBNP 250.0* 3974.0* 3324.0*    Radiological Exams on Admission: Dg Chest 2 View  07/12/2014   CLINICAL DATA:  Short of breath, chest tightness, fever, cough  EXAM: CHEST  2 VIEW  COMPARISON:  Radiograph 06/03/2014  FINDINGS: Patient rotated. Stable cardiac silhouette. There is central venous  congestion similar prior. Prominence the pulmonary arteries. Mild interstitial edema pattern The lung bases are poorly evaluated. No pleural fluid on lateral projection.  IMPRESSION: Cardiomegaly and interstitial edema. No convincing evidence pneumonia.   Electronically Signed   By: Genevive BiStewart  Edmunds M.D.   On: 07/12/2014 21:41    EKG: Independently reviewed. normal sinus rhythm, ST depression in 2, 3, aVF and V5 also present on EKG 1 month ago.  Assessment/Plan Principal Problem:   Acute respiratory failure with hypoxemia Active Problems:   Essential hypertension   OSA (obstructive sleep apnea)   Pulmonary HTN   Diabetes mellitus type 2 with complications   Restless leg syndrome   Anemia   AKI (acute kidney injury)   Chronic diastolic CHF (congestive heart failure)   CKD (chronic kidney disease) stage 3, GFR 30-59 ml/min   CAP (community acquired pneumonia)   1. Acute respiratory failure with hypoxemia Patient presents with complaints of shortness of breath. Symptom onset was sudden and chest x-ray shows bilateral congestion. EKG shows no remarkable change initial troponin is negative proBNP is at her baseline. With this patient was also found to have fever and hypoxia in the ER and therefore will be treated as suspected community-acquired pneumonia with levofloxacin. I would also treat her with DuoNeb's and get an ABG. Although her proBNP is not elevated she did very well have an acute on chronic diastolic heart failure. At present she is taking 60 mg of torsemide at home and I would give her 60 mg of IV Lasix which would be equivalent dose but only in IV form, with hopefully be more effective and diuresis. Continue monitoring on telemetry continue serial troponin. Follow cultures.  2. Anemia. Patient's hemoglobin has dropped significantly. No evidence of active bleeding reported. At present continuing aspirin. Obtain further workup. Initially the patient received one dose of  heparin since there was no complaint of active bleeding but since her hemoglobin is trending downwards heparin will be stopped and we will continue obtaining further workup.  3. Restless leg syndrome  Obstructive sleep apnea Continue with recommended band oxygen as needed.  4. Diabetes mellitus. Placing the patient sliding scale overnight than using Lantus starting tomorrow.  Advance goals of care discussion: Full code   DVT Prophylaxis: mechanical compression device Nutrition: Nothing by mouth except medications  Family Communication: Husband was present at bedside, opportunity was given to ask question and all questions were answered satisfactorily at the time of interview. Disposition: Admitted to inpatient in telemetry unit.  Author: Lynden Oxford, MD [facility] Pager: [phone] 07/13/2014, 6:09 AM    If 7PM-7AM, please contact night-coverage [email or web address] Password [email or web address]

## 2014-07-13 NOTE — Progress Notes (Signed)
Received call from lab earlier. Troponin 0.49. Dr Elvera LennoxGherghe made aware. Awaiting orders. Pt voices no complaints.

## 2014-07-14 DIAGNOSIS — E1122 Type 2 diabetes mellitus with diabetic chronic kidney disease: Secondary | ICD-10-CM

## 2014-07-14 DIAGNOSIS — N189 Chronic kidney disease, unspecified: Secondary | ICD-10-CM

## 2014-07-14 LAB — CBC
HCT: 29 % — ABNORMAL LOW (ref 36.0–46.0)
Hemoglobin: 8.8 g/dL — ABNORMAL LOW (ref 12.0–15.0)
MCH: 25.7 pg — AB (ref 26.0–34.0)
MCHC: 30.3 g/dL (ref 30.0–36.0)
MCV: 84.5 fL (ref 78.0–100.0)
PLATELETS: 187 10*3/uL (ref 150–400)
RBC: 3.43 MIL/uL — ABNORMAL LOW (ref 3.87–5.11)
RDW: 16.8 % — AB (ref 11.5–15.5)
WBC: 7 10*3/uL (ref 4.0–10.5)

## 2014-07-14 LAB — BASIC METABOLIC PANEL
Anion gap: 15 (ref 5–15)
BUN: 32 mg/dL — ABNORMAL HIGH (ref 6–23)
CALCIUM: 9 mg/dL (ref 8.4–10.5)
CO2: 26 mEq/L (ref 19–32)
Chloride: 96 mEq/L (ref 96–112)
Creatinine, Ser: 2.1 mg/dL — ABNORMAL HIGH (ref 0.50–1.10)
GFR calc Af Amer: 27 mL/min — ABNORMAL LOW (ref >90)
GFR, EST NON AFRICAN AMERICAN: 23 mL/min — AB (ref >90)
Glucose, Bld: 245 mg/dL — ABNORMAL HIGH (ref 70–99)
Potassium: 4.5 mEq/L (ref 3.7–5.3)
SODIUM: 137 meq/L (ref 137–147)

## 2014-07-14 LAB — MAGNESIUM: MAGNESIUM: 1.7 mg/dL (ref 1.5–2.5)

## 2014-07-14 LAB — GLUCOSE, CAPILLARY
GLUCOSE-CAPILLARY: 307 mg/dL — AB (ref 70–99)
Glucose-Capillary: 181 mg/dL — ABNORMAL HIGH (ref 70–99)
Glucose-Capillary: 192 mg/dL — ABNORMAL HIGH (ref 70–99)
Glucose-Capillary: 228 mg/dL — ABNORMAL HIGH (ref 70–99)
Glucose-Capillary: 342 mg/dL — ABNORMAL HIGH (ref 70–99)

## 2014-07-14 MED ORDER — FERROUS SULFATE 325 (65 FE) MG PO TABS
325.0000 mg | ORAL_TABLET | Freq: Three times a day (TID) | ORAL | Status: DC
  Administered 2014-07-14 – 2014-07-17 (×10): 325 mg via ORAL
  Filled 2014-07-14 (×14): qty 1

## 2014-07-14 MED ORDER — INSULIN ASPART 100 UNIT/ML ~~LOC~~ SOLN
2.0000 [IU] | Freq: Three times a day (TID) | SUBCUTANEOUS | Status: DC
  Administered 2014-07-14 – 2014-07-15 (×3): 2 [IU] via SUBCUTANEOUS

## 2014-07-14 NOTE — Progress Notes (Signed)
UR completed Tristin Gladman K. Gesenia Bantz, RN, BSN, MSHL, CCM  07/14/2014 11:06 AM

## 2014-07-14 NOTE — Progress Notes (Signed)
PROGRESS NOTE  Krista Wise ZOX:[id number] DOB: [birth year] DOA: 07/12/2014 PCP: [name],[name], NP  HPI: Patient admitted with sudden onset shortness of breath and chest pain, low grade temp in the ED, an a CXR with pulmonary edema without convincing evidence of pneumonia.   Subjective / 24 H Interval events Wants to go home has things to do for Christmas  Assessment/Plan: Principal Problem:   Acute respiratory failure with hypoxemia Active Problems:   Essential hypertension   OSA (obstructive sleep apnea)   Pulmonary HTN   Diabetes mellitus type 2 with complications   Restless leg syndrome   Anemia   AKI (acute kidney injury)   Chronic diastolic CHF (congestive heart failure)   CKD (chronic kidney disease) stage 3, GFR 30-59 ml/min   CAP (community acquired pneumonia)   Elevated troponin   Acute on chronic diastolic heart failure   Acute respiratory failure with hypoxia - likely multifactorial she was febrile last night, may have an underlying infectious process as well plus acute on chronic heart failure with significant weight gain  Acute on chronic diastolic heart failure - weight 258 on admission, received Lasix 12/20, weight 257 this morning  Acute on chronic renal failure  - very sensitive to Lasix, Cr worsening today  - hypotensive this morning, stop scheduled Lasix and evaluate daily  - repeat BMP tomorrow - good urine output  DM - poorly controlled, with complications with neuropathy and CKD - last A1C in 2014, repeat - continue Lantus and SSI, add mealtime for persistent increased CBGs - will avoid increasing Lantus for now with worsening renal failure  Possible CAP - continue Levofloxacin, temp 100.9 last night, patient asymptomatic - transition to oral once afebrile   Elevated troponin - likely demand in the setting of CAP, fluid overload and strain - appreciate cardiology assistance whether further workup needed  Anemia - she is iron deficient, no  evidence of bleeding, due to chronic illness and CKD likely. - she is on iron, increase from once to three times daily  RLS - continue requip  Neuropathy - continue Lyrica  Weakness - patient unable to stand due to neuropathy, PT consult    Diet: Diet 2 gram sodium Fluids: none DVT Prophylaxis: SCD  Code Status: Full Code Family Communication: d/w patient  Disposition Plan: remain inpatient  Consultants:  Cardiology   Procedures:  None    Antibiotics Levofloxacin 12/20 >>   Studies  No results found.   Objective  Filed Vitals:   07/14/14 0152 07/14/14 0158 07/14/14 0521 07/14/14 0940  BP: 85/41 98/54 85/41  98/44  Pulse: 84 84 78 77  Temp: 98.4 F (36.9 C)  97.9 F (36.6 C)   TempSrc: Oral  Oral   Resp: 22  20   Height:      Weight:   116.6 kg (257 lb 0.9 oz)   SpO2: 93%  96%     Intake/Output Summary (Last 24 hours) at 07/14/14 1123 Last data filed at 07/14/14 1059  Gross per 24 hour  Intake   1080 ml  Output   2900 ml  Net  -1820 ml   Filed Weights   07/12/14 2008 07/13/14 0034 07/14/14 0521  Weight: 96.616 kg (213 lb) 117.4 kg (258 lb 13.1 oz) 116.6 kg (257 lb 0.9 oz)    Exam:  General:  NAD  Cardiovascular: RRR  Respiratory: fine crackles at the bases  Abdomen: obese, non tender  MSK: 1-2+ edema  Neuro: non focal  Data Reviewed: Basic Metabolic  Panel:  Recent Labs Lab 07/12/14 2000 07/13/14 0454 07/14/14 0416  NA 139 141 137  K 4.9 4.6 4.5  CL 98 101 96  CO2 25 27 26   GLUCOSE 249* 171* 245*  BUN 27* 28* 32*  CREATININE 1.57* 1.81* 2.10*  CALCIUM 8.8 8.7 9.0  MG  --   --  1.7   Liver Function Tests:  Recent Labs Lab 07/13/14 0454  AST 12  ALT 9  ALKPHOS 64  BILITOT 0.3  PROT 6.4  ALBUMIN 2.8*   No results for input(s): LIPASE, AMYLASE in the last 168 hours. No results for input(s): AMMONIA in the last 168 hours. CBC:  Recent Labs Lab 07/12/14 2000 07/13/14 0454 07/14/14 0416  WBC 8.9 7.6 7.0    NEUTROABS  --  5.8  --   HGB 9.9* 9.2* 8.8*  HCT 31.5* 30.1* 29.0*  MCV 83.3 84.8 84.5  PLT 195 193 187   Cardiac Enzymes:  Recent Labs Lab 07/13/14 1000 07/13/14 1457 07/13/14 2120  TROPONINI 0.49* 0.46* <0.30   BNP (last 3 results)  Recent Labs  06/03/14 1504 06/24/14 1640 07/12/14 2000  PROBNP 250.0* 3974.0* 3324.0*   CBG:  Recent Labs Lab 07/13/14 1110 07/13/14 1608 07/13/14 2046 07/14/14 0017 07/14/14 0522  GLUCAP 245* 251* 384* 342* 192*    Recent Results (from the past 240 hour(s))  Blood culture (routine x 2)     Status: None (Preliminary result)   Collection Time: 07/12/14  8:00 PM  Result Value Ref Range Status   Specimen Description BLOOD LEFT ARM  Final   Special Requests BOTTLES DRAWN AEROBIC AND ANAEROBIC 10CC EACH  Final   Culture  Setup Time   Final    07/13/2014 04:14 Performed at [facility]    Culture   Final           BLOOD CULTURE RECEIVED NO GROWTH TO DATE CULTURE WILL BE HELD FOR 5 DAYS BEFORE ISSUING A FINAL NEGATIVE REPORT Performed at [facility]    Report Status PENDING  Incomplete  Blood culture (routine x 2)     Status: None (Preliminary result)   Collection Time: 07/12/14  8:05 PM  Result Value Ref Range Status   Specimen Description BLOOD RIGHT ARM  Final   Special Requests BOTTLES DRAWN AEROBIC ONLY 10CC  Final   Culture  Setup Time   Final    07/13/2014 04:14 Performed at [facility]    Culture   Final           BLOOD CULTURE RECEIVED NO GROWTH TO DATE CULTURE WILL BE HELD FOR 5 DAYS BEFORE ISSUING A FINAL NEGATIVE REPORT Performed at [facility]    Report Status PENDING  Incomplete     Scheduled Meds: . aspirin EC  81 mg Oral Daily  . atorvastatin  40 mg Oral QHS  . DULoxetine  60 mg Oral Daily  . ferrous sulfate  325 mg Oral Q breakfast  . furosemide  60 mg Intravenous Daily  . gabapentin  400 mg Oral BID  . insulin aspart  0-15 Units Subcutaneous 4 times per day  .  insulin glargine  45 Units Subcutaneous QHS  . levofloxacin (LEVAQUIN) IV  750 mg Intravenous Q48H  . oxybutynin  10 mg Oral q morning - 10a  . pregabalin  75 mg Oral BID  . rOPINIRole  2 mg Oral QHS  . traZODone  75 mg Oral QHS   Continuous Infusions:   Time spent:  35 minutes  Pamella Pertostin Gherghe, MD [facility] Pager [phone]. If 7 PM - 7 AM, please contact night-coverage at [email or web address], password [email or web address] 07/14/2014, 11:23 AM  LOS: 2 days

## 2014-07-14 NOTE — Care Management Note (Addendum)
    Page 1 of 2   07/19/2014     12:57:24 PM CARE MANAGEMENT NOTE 07/19/2014  Patient:  Krista Wise,Krista Wise   Account Number:  [id number]  Date Initiated:  07/14/2014  Documentation initiated by:  HUTCHINSON,CRYSTAL  Subjective/Objective Assessment:   acute respiratory failure with hypoxia,  CAP, temp 100.2     Action/Plan:   CM to follow for disposition needs   Anticipated DC Date:  07/20/2014   Anticipated DC Plan:  HOME W HOME HEALTH SERVICES      DC Planning Services  CM consult      [facility]   Choice offered to / List presented to:  C-1 Patient        HH arranged  HH-1 RN  HH-10 DISEASE MANAGEMENT  HH-2 PT      HH agency  [facility]   Status of service:  Completed, signed off Medicare Important Message given?  YES (If response is "NO", the following Medicare IM given date fields will be blank) Date Medicare IM given:  07/15/2014 Medicare IM given by:  HUTCHINSON,CRYSTAL Date Additional Medicare IM given:  07/17/2014 Additional Medicare IM given by:  CRYSTAL HUTCHINSON  Discharge Disposition:  HOME W HOME HEALTH SERVICES  Per UR Regulation:  Reviewed for med. necessity/level of care/duration of stay  If discussed at Long Length of Stay Meetings, dates discussed:   07/17/2014    Comments:  07/19/14 12:00 CM notified [facility] of discharge; CM notified [facility] DME rep, James to please deliver home O2 to room prior to discharge.  No other CM needs were communicated.  Krista Wise, BSN, CM [phone].   Crystal Hutchinson RN, BSN, MSHL, CCM  Nurse - Case Manager,  ([phone])  [phone]  07/15/2014 continues on IV Lasix Glucometer order in patients chart - d/c nurse to provide and date at time of d/c.   Crystal Hutchinson RN, BSN, MSHL, CCM  Nurse - Case Manager,  (Unit [facility])  [phone]  07/15/2014 Social:  From home with husband.   Patient admits to lack of motivation and self neglects her healthcare mgmt needs such as weighing herself  and eating correctly.  States she has no motivation.   Patient states she has been on Cymbalta over the past year but continues to have s/s of depression.  CM provided education on medication mgmt for depression and importance of reporting s/s to MD.  Advised that med dosage may need modifying.  Instructed that time of year, shorter daylight hours as well as personal health can affect s/s of depression. PT RECS:  Home health PT;Supervision for mobility/OOB Dispo Plan:  HHS:  RN, PT ([facility], Donna notified)   Crystal Hutchinson RN, BSN, MSHL, CCM  Nurse - Case Manager,  (Unit [place])  [phone]  07/14/2014 acute respiratory failure with hypoxia, CAP, temp 100.2 Social:  From home with husband. PT RECS:  Pending Disposition Plan:  pending

## 2014-07-14 NOTE — Progress Notes (Signed)
Patient stated she had to urinate. I went to assist her in stting up [facility]. Patient fell back on the bed yelling, "I'm falling! I'm fallling!" I assisted her to a sitting up right postion reassuring her that she was not falling, and ecnouraged her to hold onto the bed siderail to help herself sit up. Patient then started to edge forward onto the edge of the bed, as I encouraged her to sit up right, and braced my hands against her knees.She then pushed against my hands and slid down to the floor, crying help me,  please help me. When the nurse tech Rose and I attempted to assist her back into the bed, the patient went completely limp, and became dead weight. And when Rose attempted to lift her legs to help her sit up, the patient pushed down agaisnts Rose's hands. At which point we called security to assist patient back into the bed as well as called for additonal help from the charge nurse and other staff on the floor. Patient refused to sit up straight and became dead weight with security as well. So 3 of them had to [facility] into the bed. No injury was sustained as a result of patient sliding to the floor.  Sharlene Doryanya Jaaron Oleson, RN

## 2014-07-14 NOTE — Progress Notes (Addendum)
Physical Therapy Evaluation Patient Details Name: Krista Wise MRN: [id number] DOB: [birth year] Today's Date: 07/14/2014   History of Present Illness  Patient is a 68 yo female admitted 07/12/14 with SOB, LE edema.  Patient with acute resp failure with hypoxia, CHF exacerbation, poorly controlled DM with neuropathy.  PMH:  CAD, pulm HTN, CHF, OSA, DM, neuropathy, HTN, obesity, depression, back surgery.  Clinical Impression  Patient presents with problems listed below.  Will benefit from acute PT to maximize independence prior to discharge home with husband.  Patient needs to function at mod I level - home alone for part of the day.    Follow Up Recommendations Home health PT;Supervision for mobility/OOB    Equipment Recommendations  None recommended by PT    Recommendations for Other Services       Precautions / Restrictions Precautions Precautions: Fall Precaution Comments: Significant neuropathy BLE's impacting balance Restrictions Weight Bearing Restrictions: No      Mobility  Bed Mobility Patient sleeps in chair in living room due to difficulty getting into and out of tall bed.                  Transfers Overall transfer level: Needs assistance Equipment used: Rolling walker (2 wheeled) Transfers: Sit to/from Stand Sit to Stand: Min guard         General transfer comment: Verbal cues for hand placement and safe technique.  Encouraged patient to stand for several seconds prior to ambulation for safety.  Assist for balance/safety.  Ambulation/Gait Ambulation/Gait assistance: Min assist Ambulation Distance (Feet): 48 Feet (24' x2 with sitting rest break) Assistive device: Rolling walker (2 wheeled) Gait Pattern/deviations: Step-through pattern;Decreased stride length;Shuffle;Decreased dorsiflexion - right;Decreased dorsiflexion - left;Wide base of support Gait velocity: Decreased Gait velocity interpretation: Below normal speed for age/gender General Gait  Details: Verbal cues for safe use of RW.  Assist for balance and safety, especially in turns.  Patient fatigues at 15', with knees buckling.  Assist to maintain balance.  Stairs            Wheelchair Mobility    Modified Rankin (Stroke Patients Only)       Balance Overall balance assessment: Needs assistance         Standing balance support: Bilateral upper extremity supported Standing balance-Leahy Scale: Poor                               Pertinent Vitals/Pain Pain Assessment: No/denies pain    Home Living Family/patient expects to be discharged to:: Private residence Living Arrangements: Spouse/significant other Available Help at Discharge: Family;Available PRN/intermittently (Husband works.  Patient alone several hours during day) Type of Home: House Home Access: Ramped entrance     Home Layout: Two level;Able to live on main level with bedroom/bathroom Home Equipment: Walker - 4 wheels;Electric scooter;Shower seat - built in      Prior Function Level of Independence: Independent with assistive device(s);Needs assistance   Gait / Transfers Assistance Needed: Uses RW inside house.  Uses scooter for longer distances outside of home.  ADL's / Homemaking Assistance Needed: Assist for lower body bathing/dressing.  Husband prepares meals and does housekeeping.        Hand Dominance        Extremity/Trunk Assessment   Upper Extremity Assessment: Generalized weakness (Neuropathy Bil hands)           Lower Extremity Assessment: RLE deficits/detail;LLE deficits/detail RLE Deficits / Details: Decreased strength-grossly  4-/5; Decreased sensation in feet; Edema noted LLE Deficits / Details: Decreased strength-grossly 4-/5; Decreased sensation in feet; Edema noted  Cervical / Trunk Assessment: Normal  Communication   Communication: No difficulties  Cognition Arousal/Alertness: Awake/alert Behavior During Therapy: WFL for tasks  assessed/performed Overall Cognitive Status: Within Functional Limits for tasks assessed                      General Comments      Exercises        Assessment/Plan    PT Assessment Patient needs continued PT services  PT Diagnosis Difficulty walking;Abnormality of gait;Generalized weakness   PT Problem List Decreased strength;Decreased activity tolerance;Decreased balance;Decreased mobility;Decreased coordination;Decreased knowledge of use of DME;Cardiopulmonary status limiting activity;Obesity;Impaired sensation  PT Treatment Interventions DME instruction;Gait training;Stair training;Functional mobility training;Therapeutic activities;Therapeutic exercise;Patient/family education   PT Goals (Current goals can be found in the Care Plan section) Acute Rehab PT Goals Patient Stated Goal: To go home soon PT Goal Formulation: With patient/family Time For Goal Achievement: 07/21/14 Potential to Achieve Goals: Good    Frequency Min 4X/week   Barriers to discharge Decreased caregiver support Husband works - patient home alone for part of the day    Co-evaluation               End of Session Equipment Utilized During Treatment: Gait belt;Oxygen Activity Tolerance: Patient limited by fatigue Patient left: in chair;with call bell/phone within reach;with family/visitor present Nurse Communication: Mobility status         Time: [id number] PT Time Calculation (min) (ACUTE ONLY): 23 min   Charges:   PT Evaluation $Initial PT Evaluation Tier I: 1 Procedure PT Treatments $Gait Training: 8-22 mins   PT G Codes:          Vena AustriaDavis, Russell Quinney H 07/14/2014, 7:11 PM Durenda HurtSusan H. Renaldo Fiddleravis, PT, [facility] Acute Rehab Services Pager [phone]

## 2014-07-15 DIAGNOSIS — I5033 Acute on chronic diastolic (congestive) heart failure: Principal | ICD-10-CM

## 2014-07-15 LAB — BASIC METABOLIC PANEL
Anion gap: 9 (ref 5–15)
BUN: 30 mg/dL — AB (ref 6–23)
CO2: 28 mmol/L (ref 19–32)
Calcium: 8.5 mg/dL (ref 8.4–10.5)
Chloride: 98 mEq/L (ref 96–112)
Creatinine, Ser: 1.81 mg/dL — ABNORMAL HIGH (ref 0.50–1.10)
GFR calc non Af Amer: 28 mL/min — ABNORMAL LOW (ref >90)
GFR, EST AFRICAN AMERICAN: 32 mL/min — AB (ref >90)
Glucose, Bld: 179 mg/dL — ABNORMAL HIGH (ref 70–99)
POTASSIUM: 4.4 mmol/L (ref 3.5–5.1)
Sodium: 135 mmol/L (ref 135–145)

## 2014-07-15 LAB — LEGIONELLA ANTIGEN, URINE

## 2014-07-15 LAB — CBC
HEMATOCRIT: 30.2 % — AB (ref 36.0–46.0)
HEMOGLOBIN: 9.2 g/dL — AB (ref 12.0–15.0)
MCH: 25.3 pg — ABNORMAL LOW (ref 26.0–34.0)
MCHC: 30.5 g/dL (ref 30.0–36.0)
MCV: 83.2 fL (ref 78.0–100.0)
Platelets: 181 10*3/uL (ref 150–400)
RBC: 3.63 MIL/uL — ABNORMAL LOW (ref 3.87–5.11)
RDW: 16.5 % — AB (ref 11.5–15.5)
WBC: 8.8 10*3/uL (ref 4.0–10.5)

## 2014-07-15 LAB — GLUCOSE, CAPILLARY
GLUCOSE-CAPILLARY: 160 mg/dL — AB (ref 70–99)
GLUCOSE-CAPILLARY: 217 mg/dL — AB (ref 70–99)
Glucose-Capillary: 231 mg/dL — ABNORMAL HIGH (ref 70–99)
Glucose-Capillary: 232 mg/dL — ABNORMAL HIGH (ref 70–99)

## 2014-07-15 LAB — URINE CULTURE

## 2014-07-15 MED ORDER — FUROSEMIDE 10 MG/ML IJ SOLN
60.0000 mg | Freq: Once | INTRAMUSCULAR | Status: AC
  Administered 2014-07-15: 60 mg via INTRAVENOUS
  Filled 2014-07-15: qty 6

## 2014-07-15 MED ORDER — POTASSIUM CHLORIDE ER 10 MEQ PO TBCR
20.0000 meq | EXTENDED_RELEASE_TABLET | Freq: Two times a day (BID) | ORAL | Status: DC
  Administered 2014-07-15 – 2014-07-18 (×6): 20 meq via ORAL
  Filled 2014-07-15 (×8): qty 2

## 2014-07-15 MED ORDER — INSULIN ASPART 100 UNIT/ML ~~LOC~~ SOLN
3.0000 [IU] | Freq: Three times a day (TID) | SUBCUTANEOUS | Status: DC
  Administered 2014-07-15 – 2014-07-17 (×5): 3 [IU] via SUBCUTANEOUS

## 2014-07-15 MED ORDER — HEPARIN SODIUM (PORCINE) 5000 UNIT/ML IJ SOLN
5000.0000 [IU] | Freq: Three times a day (TID) | INTRAMUSCULAR | Status: DC
  Administered 2014-07-15 – 2014-07-19 (×10): 5000 [IU] via SUBCUTANEOUS
  Filled 2014-07-15 (×13): qty 1

## 2014-07-15 MED ORDER — METOLAZONE 2.5 MG PO TABS
2.5000 mg | ORAL_TABLET | Freq: Two times a day (BID) | ORAL | Status: DC
  Administered 2014-07-15: 2.5 mg via ORAL
  Filled 2014-07-15 (×3): qty 1

## 2014-07-15 MED ORDER — FUROSEMIDE 10 MG/ML IJ SOLN
80.0000 mg | Freq: Three times a day (TID) | INTRAMUSCULAR | Status: DC
  Administered 2014-07-16 – 2014-07-18 (×7): 80 mg via INTRAVENOUS
  Filled 2014-07-15 (×11): qty 8

## 2014-07-15 NOTE — Progress Notes (Signed)
Orthopedic Tech Progress Note Patient Details:  Theodoro KalataMary O Offord 1946/04/27 [id number] Deleted double ortho tech visit. Patient ID: Theodoro KalataMary O Jantz, female   DOB: 1946/04/27, 68 y.o.   MRN: [id number]   Jennye MoccasinHughes, Madelin Weseman Craig 07/15/2014, 8:55 PM

## 2014-07-15 NOTE — Progress Notes (Signed)
PROGRESS NOTE  Krista Wise YNW:[id number] DOB: 1946/05/21 DOA: 07/12/2014 PCP: [name], NP  HPI: Patient admitted with sudden onset shortness of breath and chest pain, low grade temp in the ED, an a CXR with pulmonary edema without convincing evidence of pneumonia.   Interim: Patient found to have troponin elevation and worsening fluid overload, currently undergoing IV diuresis with fairly stable renal function. Cardiology following.   Subjective / 24 H Interval events - she is feeling better, wishes to be home for Christmas  Assessment/Plan: Principal Problem:   Acute respiratory failure with hypoxemia Active Problems:   Essential hypertension   OSA (obstructive sleep apnea)   Pulmonary HTN   Diabetes mellitus type 2 with complications   Restless leg syndrome   Anemia   AKI (acute kidney injury)   Chronic diastolic CHF (congestive heart failure)   CKD (chronic kidney disease) stage 3, GFR 30-59 ml/min   CAP (community acquired pneumonia)   Elevated troponin   Acute on chronic diastolic heart failure   Acute respiratory failure with hypoxia - likely multifactorial she was febrile after admission may have an underlying infectious process as well plus acute on chronic heart failure with significant weight gain - afebrile last night, clinically improving  Acute on chronic diastolic heart failure - weight 258 on admission, received Lasix 12/20, 12/21, 12/22 - weight 255 today  Acute on chronic renal failure  - overall stable renal function with Lasix, worse yesterday, improving today - repeat BMP tomorrow  - good urine output   DM - poorly controlled, with complications with neuropathy and CKD - last A1C in 2014, repeat - continue Lantus and SSI, add mealtime for persistent increased CBGs - will avoid increasing Lantus for now with worsening renal failure - increase scheduled Novolog given persistently elevated CBGs towards the end of the day   Possible CAP -  continue Levofloxacin, temp 100.9 12/20 evening, 99 12/21 evening, patient asymptomatic - transition to oral once afebrile   Elevated troponin - likely demand in the setting of CAP, fluid overload and strain - appreciate cardiology assistance whether further workup needed  Anemia - she is iron deficient, no evidence of bleeding, due to chronic illness and CKD likely. - she is on iron, increase from once to three times daily  RLS - continue requip  Neuropathy - continue Lyrica  Weakness - patient unable to stand due to neuropathy, PT consult    Diet: Diet 2 gram sodium Fluids: none DVT Prophylaxis: SCD  Code Status: Full Code Family Communication: d/w patient  Disposition Plan: remain inpatient  Consultants:  Cardiology   Procedures:  None    Antibiotics Levofloxacin 12/20 >>   Studies  No results found.   Objective  Filed Vitals:   07/14/14 1720 07/14/14 2136 07/15/14 0154 07/15/14 0511  BP: 121/62 110/61 111/55 109/58  Pulse: 73 77 86 82  Temp: 98.2 F (36.8 C) 98.4 F (36.9 C) 99 F (37.2 C) 98.9 F (37.2 C)  TempSrc: Oral Oral Oral Oral  Resp: 20 22 22 20   Height:      Weight:    115.683 kg (255 lb 0.6 oz)  SpO2: 100% 99% 96% 98%    Intake/Output Summary (Last 24 hours) at 07/15/14 0724 Last data filed at 07/15/14 0004  Gross per 24 hour  Intake   1160 ml  Output   1700 ml  Net   -540 ml   Filed Weights   07/13/14 0034 07/14/14 0521 07/15/14 0511  Weight: 117.4  kg (258 lb 13.1 oz) 116.6 kg (257 lb 0.9 oz) 115.683 kg (255 lb 0.6 oz)    Exam:  General:  NAD  Cardiovascular: RRR  Respiratory: fine crackles at the bases  Abdomen: obese, non tender  MSK: 1-2+ edema  Neuro: non focal  Data Reviewed: Basic Metabolic Panel:  Recent Labs Lab 07/12/14 2000 07/13/14 0454 07/14/14 0416 07/15/14 0410  NA 139 141 137 135  K 4.9 4.6 4.5 4.4  CL 98 101 96 98  CO2 25 27 26 28   GLUCOSE 249* 171* 245* 179*  BUN 27* 28* 32* 30*    CREATININE 1.57* 1.81* 2.10* 1.81*  CALCIUM 8.8 8.7 9.0 8.5  MG  --   --  1.7  --    Liver Function Tests:  Recent Labs Lab 07/13/14 0454  AST 12  ALT 9  ALKPHOS 64  BILITOT 0.3  PROT 6.4  ALBUMIN 2.8*   CBC:  Recent Labs Lab 07/12/14 2000 07/13/14 0454 07/14/14 0416 07/15/14 0410  WBC 8.9 7.6 7.0 8.8  NEUTROABS  --  5.8  --   --   HGB 9.9* 9.2* 8.8* 9.2*  HCT 31.5* 30.1* 29.0* 30.2*  MCV 83.3 84.8 84.5 83.2  PLT 195 193 187 181   Cardiac Enzymes:  Recent Labs Lab 07/13/14 1000 07/13/14 1457 07/13/14 2120  TROPONINI 0.49* 0.46* <0.30   BNP (last 3 results)  Recent Labs  06/03/14 1504 06/24/14 1640 07/12/14 2000  PROBNP 250.0* 3974.0* 3324.0*   CBG:  Recent Labs Lab 07/14/14 0522 07/14/14 1204 07/14/14 1717 07/14/14 2304 07/15/14 0515  GLUCAP 192* 228* 181* 307* 160*    Recent Results (from the past 240 hour(s))  Blood culture (routine x 2)     Status: None (Preliminary result)   Collection Time: 07/12/14  8:00 PM  Result Value Ref Range Status   Specimen Description BLOOD LEFT ARM  Final   Special Requests BOTTLES DRAWN AEROBIC AND ANAEROBIC 10CC EACH  Final   Culture  Setup Time   Final    07/13/2014 04:14 [facility]formed [facility]    Culture   Final           BLOOD CULTURE RECEIVED NO GROWTH TO DATE CULTURE WILL BE HELD FOR 5 DAYS BEFORE ISSUING A FINAL NEGATIVE REPORT Performed at [facility]    Report Status PENDING  Incomplete  Blood culture (routine x 2)     Status: None (Preliminary result)   Collection Time: 07/12/14  8:05 PM  Result Value Ref Range Status   Specimen Description BLOOD RIGHT ARM  Final   Special Requests BOTTLES DRAWN AEROBIC ONLY 10CC  Final   Culture  Setup Time   Final    07/13/2014 04:14 Performed at [facility]    Culture   Final           BLOOD CULTURE RECEIVED NO GROWTH TO DATE CULTURE WILL BE HELD FOR 5 DAYS BEFORE ISSUING A FINAL NEGATIVE REPORT [facility]formed at [facility]    Report Status PENDING  Incomplete  Urine culture     Status: None (Preliminary result)   Collection Time: 07/12/14 11:19 PM  Result Value Ref Range Status   Specimen Description URINE, CATHETERIZED  Final   Special Requests NONE  Final   Culture  Setup Time   Final    07/13/2014 05:45 Performed at [facility]    Colony Count   Final    >=100,000 COLONIES/ML [facility]formed at [facility]    Culture   Final    GRAM NEGATIVE RODS Performed at [facility]    Report Status PENDING  Incomplete     Scheduled Meds: . aspirin EC  81 mg Oral Daily  . atorvastatin  40 mg Oral QHS  . DULoxetine  60 mg Oral Daily  . ferrous sulfate  325 mg Oral TID WC  . gabapentin  400 mg Oral BID  . insulin aspart  0-15 Units Subcutaneous 4 times per day  . insulin aspart  2 Units Subcutaneous TID WC  . insulin glargine  45 Units Subcutaneous QHS  . levofloxacin (LEVAQUIN) IV  750 mg Intravenous Q48H  . oxybutynin  10 mg Oral q morning - 10a  . pregabalin  75 mg Oral BID  . rOPINIRole  2 mg Oral QHS  . traZODone  75 mg Oral QHS   Continuous Infusions:   Time spent: 25 minutes  Pamella Pertostin Krista Micek, MD [facility] Pager [phone]. If 7 PM - 7 AM, please contact night-coverage at [email or web address], password [email or web address] 07/15/2014, 7:24 AM  LOS: 3 days

## 2014-07-15 NOTE — Progress Notes (Signed)
Orthopedic Tech Progress Note Patient Details:  Krista Wise 01-25-1946 [id number]  Ortho Devices Type of Ortho Device: Radio broadcast assistantUnna boot Ortho Device/Splint Location: (B) LE Ortho Device/Splint Interventions: Ordered, Application   Jennye MoccasinHughes, Johanny Segers Craig 07/15/2014, 8:54 PM

## 2014-07-15 NOTE — Progress Notes (Signed)
Physical Therapy Treatment Patient Details Name: Krista Wise MRN: [id number] DOB: [birth year] Today's Date: 07/15/2014    History of Present Illness Patient is a 68 yo female admitted 07/12/14 with SOB, LE edema.  Patient with acute resp failure with hypoxia, CHF exacerbation, poorly controlled DM with neuropathy.  PMH:  CAD, pulm HTN, CHF, OSA, DM, neuropathy, HTN, obesity, depression, back surgery.    PT Comments    Patient tolerated increased distance this session. Ambulated on room air with multiple rest breaks required. SpO2 monitored, patient 90% at rest on room air, 98% on 2 liters, desaturated to 85% on room air with activity.  Educated patient extensively on energy conservation, limited UE use when noted SOB and pursed lipped breathing.  Patient demonstrates good technique.  Patient may need supplemental O2 initially upon discharge secondary to desaturations with ambulation. Will continue to see and progress as tolerated.   Follow Up Recommendations  Home health PT;Supervision for mobility/OOB     Equipment Recommendations  None recommended by PT    Recommendations for Other Services       Precautions / Restrictions Precautions Precautions: Fall Precaution Comments: Significant neuropathy BLE's impacting balance Restrictions Weight Bearing Restrictions: No    Mobility  Bed Mobility Overal bed mobility: Needs Assistance Bed Mobility: Supine to Sit     Supine to sit: Min assist     General bed mobility comments: VCs for positioning and sequencing  Transfers Overall transfer level: Needs assistance Equipment used: Rolling walker (2 wheeled) Transfers: Sit to/from Stand Sit to Stand: Min guard         General transfer comment: VCs for hand placement and positioning at edge of chair, performed x3 during session  Ambulation/Gait Ambulation/Gait assistance: Min guard Ambulation Distance (Feet): 120 Feet (3 standing rest breaks) Assistive device: Rolling  walker (2 wheeled) Gait Pattern/deviations: Step-through pattern;Decreased stride length;Shuffle;Decreased dorsiflexion - right;Decreased dorsiflexion - left;Wide base of support Gait velocity: Decreased Gait velocity interpretation: Below normal speed for age/gender General Gait Details: VCs for upright posture and controlled breathing   Stairs            Wheelchair Mobility    Modified Rankin (Stroke Patients Only)       Balance           Standing balance support: During functional activity Standing balance-Leahy Scale: Poor                      Cognition Arousal/Alertness: Awake/alert Behavior During Therapy: WFL for tasks assessed/performed Overall Cognitive Status: Within Functional Limits for tasks assessed                      Exercises      General Comments        Pertinent Vitals/Pain Pain Assessment: No/denies pain    Home Living                      Prior Function            PT Goals (current goals can now be found in the care plan section) Acute Rehab PT Goals Patient Stated Goal: To go home soon PT Goal Formulation: With patient/family Time For Goal Achievement: 07/21/14 Potential to Achieve Goals: Good Progress towards PT goals: Progressing toward goals    Frequency  Min 4X/week    PT Plan Current plan remains appropriate    Co-evaluation  End of Session Equipment Utilized During Treatment: Gait belt;Oxygen Activity Tolerance: Patient limited by fatigue Patient left: in chair;with call bell/phone within reach;with family/visitor present     Time: 1308-65781057-1122 PT Time Calculation (min) (ACUTE ONLY): 25 min  Charges:  $Gait Training: 8-22 mins $Self Care/Home Management: 8-22                    G CodesFabio Asa:      Neyla Gauntt J 07/15/2014, 3:57 PM Charlotte Crumbevon Arayna Illescas, PT DPT  [phone]

## 2014-07-15 NOTE — Progress Notes (Signed)
Inpatient Diabetes Program Recommendations  AACE/ADA: New Consensus Statement on Inpatient Glycemic Control (2013)  Target Ranges:  Prepandial:   less than 140 mg/dL      Peak postprandial:   less than 180 mg/dL (1-2 hours)      Critically ill patients:  140 - 180 mg/dL  Results for [name], Berdine O (MRN [id number]) as of 07/15/2014 10:57  Ref. Range 07/14/2014 05:22 07/14/2014 12:04 07/14/2014 17:17 07/14/2014 23:04 07/15/2014 05:15  Glucose-Capillary Latest Range: 70-99 mg/dL 829192 (H) 562228 (H) 130181 (H) 307 (H) 160 (H)   Inpatient Diabetes Program Recommendations Correction (SSI): change to TID ac and HS scale which starts at 200 Thank you  Piedad ClimesGina Arnecia Ector BSN, RN,CDE Inpatient Diabetes Coordinator [phone] (team pager)

## 2014-07-15 NOTE — Progress Notes (Signed)
Advanced Heart Failure Rounding Note   Subjective:    Diuresing slowly. Weight down 3 pounds. Breathing better. No CP. Troponin now normal. No further chest pressure.    Objective:   Weight Range:  Vital Signs:   Temp:  [97.8 F (36.6 C)-99 F (37.2 C)] 98.4 F (36.9 C) (12/22 1544) Pulse Rate:  [71-86] 71 (12/22 1544) Resp:  [20-22] 20 (12/22 1544) BP: (96-111)/(51-61) 111/52 mmHg (12/22 1544) SpO2:  [93 %-99 %] 93 % (12/22 1544) Weight:  [115.683 kg (255 lb 0.6 oz)] 115.683 kg (255 lb 0.6 oz) (12/22 0511) Last BM Date: 07/12/14  Weight change: Filed Weights   07/13/14 0034 07/14/14 0521 07/15/14 0511  Weight: 117.4 kg (258 lb 13.1 oz) 116.6 kg (257 lb 0.9 oz) 115.683 kg (255 lb 0.6 oz)    Intake/Output:   Intake/Output Summary (Last 24 hours) at 07/15/14 1751 Last data filed at 07/15/14 1731  Gross per 24 hour  Intake   1800 ml  Output    800 ml  Net   1000 ml     Physical Exam: General: . Obese woman. Sitting in chair NAD.  Neck: Neck supple, hard to see JVP No masses, thyromegaly or abnormal cervical nodes.  Lungs: Clear bilaterally  Heart: Non-displaced PMI, chest non-tender; regular rate and rhythm, S1, S2 without rubs or gallops. 2/6 early systolic murmur RUSB. Carotid upstroke normal, no bruit. Pedals normal pulses. 3+ edema into thighs Abdomen: Obese soft and non-tender without masses, organomegaly, or hernias noted. No hepatosplenomegaly.  Extremities: No clubbing or cyanosis.  Neurologic: Alert and oriented x 3.   Telemetry: NSR 60s  Labs: Basic Metabolic Panel:  Recent Labs Lab 07/12/14 2000 07/13/14 0454 07/14/14 0416 07/15/14 0410  NA 139 141 137 135  K 4.9 4.6 4.5 4.4  CL 98 101 96 98  CO2 25 27 26 28   GLUCOSE 249* 171* 245* 179*  BUN 27* 28* 32* 30*  CREATININE 1.57* 1.81* 2.10* 1.81*  CALCIUM 8.8 8.7 9.0 8.5  MG  --   --  1.7  --     Liver Function Tests:  Recent Labs Lab 07/13/14 0454  AST 12  ALT 9  ALKPHOS 64   BILITOT 0.3  PROT 6.4  ALBUMIN 2.8*   No results for input(s): LIPASE, AMYLASE in the last 168 hours. No results for input(s): AMMONIA in the last 168 hours.  CBC:  Recent Labs Lab 07/12/14 2000 07/13/14 0454 07/14/14 0416 07/15/14 0410  WBC 8.9 7.6 7.0 8.8  NEUTROABS  --  5.8  --   --   HGB 9.9* 9.2* 8.8* 9.2*  HCT 31.5* 30.1* 29.0* 30.2*  MCV 83.3 84.8 84.5 83.2  PLT 195 193 187 181    Cardiac Enzymes:  Recent Labs Lab 07/13/14 1000 07/13/14 1457 07/13/14 2120  TROPONINI 0.49* 0.46* <0.30    BNP: BNP (last 3 results)  Recent Labs  06/03/14 1504 06/24/14 1640 07/12/14 2000  PROBNP 250.0* 3974.0* 3324.0*     Other results:    Imaging:  No results found.   Medications:     Scheduled Medications: . aspirin EC  81 mg Oral Daily  . atorvastatin  40 mg Oral QHS  . DULoxetine  60 mg Oral Daily  . ferrous sulfate  325 mg Oral TID WC  . gabapentin  400 mg Oral BID  . heparin subcutaneous  5,000 Units Subcutaneous 3 times per day  . insulin aspart  0-15 Units Subcutaneous 4 times per day  . insulin  aspart  3 Units Subcutaneous TID WC  . insulin glargine  45 Units Subcutaneous QHS  . levofloxacin (LEVAQUIN) IV  750 mg Intravenous Q48H  . oxybutynin  10 mg Oral q morning - 10a  . pregabalin  75 mg Oral BID  . rOPINIRole  2 mg Oral QHS  . traZODone  75 mg Oral QHS     Infusions:     PRN Medications:  acetaminophen, albuterol   Assessment:   1. A/C diastolic HF 2. A/c respiratory failure 3. CAD with previous LCX stents 4. Mildly elevated troponin 5. Morbid obesity 6. Severe OSA 7. Mild AS 8. CKD stage III  Plan/Discussion:    She has massive volume overload. Weight up 37 pounds since last HF Clinic visit on 12/1. Will need to increase diuretic regimen. Will increase lasix to 80 IV tid and add metolazone 2.5 bid.  Will also wrap legs. Watch renal function and electrolytes closely. Suspect dietary indiscretion playing a large role  here.   Suspect minimally elevated troponin and chest pressure due to volume overload and not ACS.    Length of Stay: 3   Arvilla Meresaniel Mennie Spiller MD 07/15/2014, 5:51 PM  Advanced Heart Failure Team Pager [phone] (M-F; 7a - 4p)  Please contact [facility] for night-coverage after hours (4p -7a ) and weekends on [email or web address]

## 2014-07-15 NOTE — Progress Notes (Signed)
Patient at 90% on room air and 98% on 2 L [place] at rest. With activity, she is sat on 85% walking on room air. She denied any distress without O2 while walking.   Sim BoastHavy, RN

## 2014-07-16 LAB — GLUCOSE, CAPILLARY
GLUCOSE-CAPILLARY: 265 mg/dL — AB (ref 70–99)
Glucose-Capillary: 210 mg/dL — ABNORMAL HIGH (ref 70–99)
Glucose-Capillary: 220 mg/dL — ABNORMAL HIGH (ref 70–99)
Glucose-Capillary: 282 mg/dL — ABNORMAL HIGH (ref 70–99)

## 2014-07-16 LAB — BASIC METABOLIC PANEL
Anion gap: 9 (ref 5–15)
BUN: 29 mg/dL — AB (ref 6–23)
CO2: 29 mmol/L (ref 19–32)
Calcium: 8.9 mg/dL (ref 8.4–10.5)
Chloride: 97 mEq/L (ref 96–112)
Creatinine, Ser: 1.72 mg/dL — ABNORMAL HIGH (ref 0.50–1.10)
GFR calc Af Amer: 35 mL/min — ABNORMAL LOW (ref >90)
GFR calc non Af Amer: 30 mL/min — ABNORMAL LOW (ref >90)
GLUCOSE: 314 mg/dL — AB (ref 70–99)
POTASSIUM: 4.4 mmol/L (ref 3.5–5.1)
Sodium: 135 mmol/L (ref 135–145)

## 2014-07-16 MED ORDER — METOLAZONE 5 MG PO TABS
5.0000 mg | ORAL_TABLET | Freq: Two times a day (BID) | ORAL | Status: DC
  Administered 2014-07-16 – 2014-07-17 (×4): 5 mg via ORAL
  Filled 2014-07-16 (×6): qty 1

## 2014-07-16 MED ORDER — INSULIN GLARGINE 100 UNIT/ML ~~LOC~~ SOLN
55.0000 [IU] | Freq: Every day | SUBCUTANEOUS | Status: DC
  Administered 2014-07-16 – 2014-07-17 (×2): 55 [IU] via SUBCUTANEOUS
  Filled 2014-07-16 (×3): qty 0.55

## 2014-07-16 MED ORDER — MAGNESIUM SULFATE 2 GM/50ML IV SOLN
2.0000 g | Freq: Once | INTRAVENOUS | Status: AC
  Administered 2014-07-16: 2 g via INTRAVENOUS
  Filled 2014-07-16 (×2): qty 50

## 2014-07-16 MED ORDER — GABAPENTIN 300 MG PO CAPS
600.0000 mg | ORAL_CAPSULE | Freq: Three times a day (TID) | ORAL | Status: DC
  Administered 2014-07-16 – 2014-07-17 (×5): 600 mg via ORAL
  Filled 2014-07-16 (×8): qty 2

## 2014-07-16 NOTE — Progress Notes (Signed)
PROGRESS NOTE  Krista Wise ZOX:[id number] DOB: 08-Dec-1945 DOA: 07/12/2014 PCP: [name],[name], NP  HPI: Patient admitted with sudden onset shortness of breath and chest pain, low grade temp in the ED, an a CXR with pulmonary edema without convincing evidence of pneumonia.   Interim: Patient found to have troponin elevation and worsening fluid overload, currently undergoing IV diuresis with fairly stable renal function. Cardiology following.     Subjective / 24 H Interval events   Patient in chair, no headache, no chest abdominal pain. No shortness of breath currently. Still has edema.  Assessment/Plan:   Acute respiratory failure with hypoxia -  due to committee acquired pneumonia and acute on chronic diastolic CHF. Off antibiotics, being diuresed by cardiology, oxygen and apply the treatments as needed. Slow improvement.    Acute on chronic diastolic heart failure Ef 55%  Cardiology monitoring diuretics, baseline weight 218 pounds, still about 35-40 pounds over. Heart healthy diet and fluid restriction added.    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     DM 2-  Poor control, on Lantus and sliding scale will adjust Lantus dose for better control  Lab Results  Component Value Date   HGBA1C 8.1* 07/07/2013    CBG (last 3)   Recent Labs  07/15/14 1724 07/15/14 2050 07/16/14 0638  GLUCAP 217* 232* 210*     Possible CAP - finished antibiotic treatment.    Elevated troponin  likely demand in the setting of CAP and CHF,  appreciate cardiology assistance, for further cardiac workup. On aspirin and statin. Beta blocker deferred to cardiology.    Anemia - she is iron deficient, no evidence of bleeding, due to chronic illness and CKD likely. Oral iron increased.     RLS - continue requip   Neuropathy - continue Lyrica   Weakness - patient unable to stand due to neuropathy, PT  consult     Diet: Diet 2 gram sodium, add 1.2 lit fluid/day restriction Fluids: none DVT Prophylaxis: SCD  Code Status: Full Code Family Communication: d/w patient  Disposition Plan: remain inpatient  Consultants:  Cardiology   Procedures:  None    Antibiotics  Anti-infectives    Start     Dose/Rate Route Frequency Ordered Stop   07/13/14 0200  levofloxacin (LEVAQUIN) IVPB 750 mg     750 mg100 mL/hr over 90 Minutes Intravenous Every 48 hours 07/13/14 0143 07/19/14 0159   07/12/14 2300  azithromycin (ZITHROMAX) 500 mg in dextrose 5 % 250 mL IVPB     500 mg250 mL/hr over 60 Minutes Intravenous  Once 07/12/14 2259 07/13/14 0053   07/12/14 2300  cefTRIAXone (ROCEPHIN) 1 g in dextrose 5 % 50 mL IVPB     1 g100 mL/hr over 30 Minutes Intravenous  Once 07/12/14 2259 07/12/14 2349        Studies  No results found.   Objective  Filed Vitals:   07/15/14 2040 07/16/14 0159 07/16/14 0633 07/16/14 0900  BP: 130/68 91/42 106/49   Pulse: 78 78 75   Temp: 97.8 F (36.6 C) 97.4 F (36.3 C) 97.9 F (36.6 C)   TempSrc: Oral Oral Oral   Resp: 20 16 18    Height:      Weight:   117.346 kg (258 lb 11.2 oz) 117.7 kg (259 lb 7.7 oz)  SpO2: 96% 95% 95%     Intake/Output Summary (Last 24 hours) at 07/16/14 1129 Last data filed at  07/16/14 0900  Gross per 24 hour  Intake   1780 ml  Output   1600 ml  Net    180 ml   Filed Weights   07/15/14 0511 07/16/14 3244 07/16/14 0900  Weight: 115.683 kg (255 lb 0.6 oz) 117.346 kg (258 lb 11.2 oz) 117.7 kg (259 lb 7.7 oz)   Baseline weight 218 pounds   Exam:  General:  NAD  Cardiovascular: RRR  Respiratory: fine crackles at the bases  Abdomen: obese, non tender  MSK: 1-2+ edema  Neuro: non focal  Data Reviewed: Basic Metabolic Panel:  Recent Labs Lab 07/12/14 2000 07/13/14 0454 07/14/14 0416 07/15/14 0410  NA 139 141 137 135  K 4.9 4.6 4.5 4.4  CL 98 101 96 98  CO2 25 27 26 28   GLUCOSE 249* 171* 245* 179*    BUN 27* 28* 32* 30*  CREATININE 1.57* 1.81* 2.10* 1.81*  CALCIUM 8.8 8.7 9.0 8.5  MG  --   --  1.7  --    Liver Function Tests:  Recent Labs Lab 07/13/14 0454  AST 12  ALT 9  ALKPHOS 64  BILITOT 0.3  PROT 6.4  ALBUMIN 2.8*   CBC:  Recent Labs Lab 07/12/14 2000 07/13/14 0454 07/14/14 0416 07/15/14 0410  WBC 8.9 7.6 7.0 8.8  NEUTROABS  --  5.8  --   --   HGB 9.9* 9.2* 8.8* 9.2*  HCT 31.5* 30.1* 29.0* 30.2*  MCV 83.3 84.8 84.5 83.2  PLT 195 193 187 181   Cardiac Enzymes:  Recent Labs Lab 07/13/14 1000 07/13/14 1457 07/13/14 2120  TROPONINI 0.49* 0.46* <0.30   BNP (last 3 results)  Recent Labs  06/03/14 1504 06/24/14 1640 07/12/14 2000  PROBNP 250.0* 3974.0* 3324.0*   CBG:  Recent Labs Lab 07/15/14 0515 07/15/14 1145 07/15/14 1724 07/15/14 2050 07/16/14 0638  GLUCAP 160* 231* 217* 232* 210*    Recent Results (from the past 240 hour(s))  Blood culture (routine x 2)     Status: None (Preliminary result)   Collection Time: 07/12/14  8:00 PM  Result Value Ref Range Status   Specimen Description BLOOD LEFT ARM  Final   Special Requests BOTTLES DRAWN AEROBIC AND ANAEROBIC 10CC EACH  Final   Culture  Setup Time   Final    07/13/2014 04:14 Performed at [facility]    Culture   Final           BLOOD CULTURE RECEIVED NO GROWTH TO DATE CULTURE WILL BE HELD FOR 5 DAYS BEFORE ISSUING A FINAL NEGATIVE REPORT Note: Culture results may be compromised due to an excessive volume of blood received in culture bottles. Performed at [facility]    Report Status PENDING  Incomplete  Blood culture (routine x 2)     Status: None (Preliminary result)   Collection Time: 07/12/14  8:05 PM  Result Value Ref Range Status   Specimen Description BLOOD RIGHT ARM  Final   Special Requests BOTTLES DRAWN AEROBIC ONLY 10CC  Final   Culture  Setup Time   Final    07/13/2014 04:14 Performed at [facility]    Culture   Final           BLOOD  CULTURE RECEIVED NO GROWTH TO DATE CULTURE WILL BE HELD FOR 5 DAYS BEFORE ISSUING A FINAL NEGATIVE REPORT Performed at [facility]    Report Status PENDING  Incomplete  Urine culture     Status: None   Collection  Time: 07/12/14 11:19 PM  Result Value Ref Range Status   Specimen Description URINE, CATHETERIZED  Final   Special Requests NONE  Final   Culture  Setup Time   Final    07/13/2014 05:45 [facility]    Colony Count   Final    >=100,000 COLONIES/ML [facility]    Culture   Final    ESCHERICHIA COLI [facility]formed at [facility]    Report Status 07/15/2014 FINAL  Final   Organism ID, Bacteria ESCHERICHIA COLI  Final      Susceptibility   Escherichia coli - MIC*    AMPICILLIN <=2 SENSITIVE Sensitive     CEFAZOLIN <=4 SENSITIVE Sensitive     CEFTRIAXONE <=1 SENSITIVE Sensitive     CIPROFLOXACIN <=0.25 SENSITIVE Sensitive     GENTAMICIN <=1 SENSITIVE Sensitive     LEVOFLOXACIN <=0.12 SENSITIVE Sensitive     NITROFURANTOIN <=16 SENSITIVE Sensitive     TOBRAMYCIN <=1 SENSITIVE Sensitive     TRIMETH/SULFA <=20 SENSITIVE Sensitive     PIP/TAZO <=4 SENSITIVE Sensitive     * ESCHERICHIA COLI     Scheduled Meds: . aspirin EC  81 mg Oral Daily  . atorvastatin  40 mg Oral QHS  . DULoxetine  60 mg Oral Daily  . ferrous sulfate  325 mg Oral TID WC  . furosemide  80 mg Intravenous 3 times per day  . gabapentin  400 mg Oral BID  . heparin subcutaneous  5,000 Units Subcutaneous 3 times per day  . insulin aspart  0-15 Units Subcutaneous 4 times per day  . insulin aspart  3 Units Subcutaneous TID WC  . insulin glargine  45 Units Subcutaneous QHS  . levofloxacin (LEVAQUIN) IV  750 mg Intravenous Q48H  . magnesium sulfate 1 - 4 g bolus IVPB  2 g Intravenous Once  . metolazone  5 mg Oral BID  . oxybutynin  10 mg Oral q morning - 10a  . potassium chloride  20 mEq Oral BID  . pregabalin  75 mg Oral BID  . rOPINIRole  2 mg  Oral QHS  . traZODone  75 mg Oral QHS   Continuous Infusions:   Time spent: 25 minutes  Susa RaringSINGH,PRASHANT K M.D on 07/16/2014 at 11:29 AM  Between 7am to 7pm - Pager - [phone], After 7pm go to [email or web address] - password [email or web address]  And look for the night coverage person covering me after hours  [facility]  Office  [phone]  07/16/2014, 11:29 AM  LOS: 4 days

## 2014-07-16 NOTE — Progress Notes (Signed)
Physical Therapy Treatment Patient Details Name: Krista Wise MRN: [id number] DOB: August 08, 1945 Today's Date: 07/16/2014    History of Present Illness Patient is a 68 yo female admitted 07/12/14 with SOB, LE edema.  Patient with acute resp failure with hypoxia, CHF exacerbation, poorly controlled DM with neuropathy.  PMH:  CAD, pulm HTN, CHF, OSA, DM, neuropathy, HTN, obesity, depression, back surgery.    PT Comments    Patient progressing well with mobility, Ambulated increased distance and able to recall with good accuracy education and techniques from previous session. Will continue current POC.  Follow Up Recommendations  Home health PT;Supervision for mobility/OOB     Equipment Recommendations  None recommended by PT    Recommendations for Other Services       Precautions / Restrictions Precautions Precautions: Fall Precaution Comments: Significant neuropathy BLE's impacting balance Restrictions Weight Bearing Restrictions: No    Mobility  Bed Mobility Overal bed mobility: Needs Assistance Bed Mobility: Supine to Sit     Supine to sit: Min assist     General bed mobility comments: VCs for positioning and sequencing  Transfers Overall transfer level: Needs assistance Equipment used: Rolling walker (2 wheeled) Transfers: Sit to/from Stand Sit to Stand: Supervision         General transfer comment: VCs for hand placement and positioning at edge of chair, performed x3 during session  Ambulation/Gait Ambulation/Gait assistance: Supervision Ambulation Distance (Feet): 210 Feet Assistive device: Rolling walker (2 wheeled) Gait Pattern/deviations: Step-through pattern;Decreased stride length;Shuffle;Decreased dorsiflexion - right;Decreased dorsiflexion - left;Wide base of support Gait velocity: Decreased Gait velocity interpretation: Below normal speed for age/gender General Gait Details: Continued VCs for upright posture and positioning with RW   Stairs             Wheelchair Mobility    Modified Rankin (Stroke Patients Only)       Balance             Standing balance-Leahy Scale: Poor                      Cognition Arousal/Alertness: Awake/alert Behavior During Therapy: WFL for tasks assessed/performed Overall Cognitive Status: Within Functional Limits for tasks assessed                      Exercises      General Comments General comments (skin integrity, edema, etc.): patient able to recall education topics from previous session, demonstrates good technique for breathing exercises. Tolerated in room standing and balance activities without physical assist (using RW)      Pertinent Vitals/Pain Pain Assessment: No/denies pain    Home Living                      Prior Function            PT Goals (current goals can now be found in the care plan section) Acute Rehab PT Goals Patient Stated Goal: To go home soon PT Goal Formulation: With patient/family Time For Goal Achievement: 07/21/14 Potential to Achieve Goals: Good Progress towards PT goals: Progressing toward goals    Frequency  Min 4X/week    PT Plan Current plan remains appropriate    Co-evaluation             End of Session Equipment Utilized During Treatment: Gait belt;Oxygen Activity Tolerance: Patient limited by fatigue Patient left: in chair;with call bell/phone within reach;with family/visitor present     Time: [id number] PT Time  Calculation (min) (ACUTE ONLY): 24 min  Charges:  $Gait Training: 8-22 mins $Therapeutic Activity: 8-22 mins                    G CodesFabio Wise:      Krista Wise J 07/16/2014, 3:48 PM Krista Wise, PT DPT  [phone]

## 2014-07-16 NOTE — Progress Notes (Signed)
Advanced Heart Failure Rounding Note   Subjective:    Re-weighing patient this am because weight up and weight is up. Diuretics increased yesterday and she reports UOP just now picking up. Pending BMET. Breathing improved and no CP.     Objective:   Weight Range:  Vital Signs:   Temp:  [97.4 F (36.3 C)-98.4 F (36.9 C)] 97.9 F (36.6 C) (12/23 40980633) Pulse Rate:  [71-78] 75 (12/23 0633) Resp:  [16-20] 18 (12/23 11910633) BP: (91-130)/(42-68) 106/49 mmHg (12/23 0633) SpO2:  [93 %-96 %] 95 % (12/23 47820633) Weight:  [258 lb 11.2 oz (117.346 kg)] 258 lb 11.2 oz (117.346 kg) (12/23 0633) Last BM Date: 07/12/14  Weight change: Filed Weights   07/14/14 0521 07/15/14 0511 07/16/14 0633  Weight: 257 lb 0.9 oz (116.6 kg) 255 lb 0.6 oz (115.683 kg) 258 lb 11.2 oz (117.346 kg)    Intake/Output:   Intake/Output Summary (Last 24 hours) at 07/16/14 0917 Last data filed at 07/16/14 0840  Gross per 24 hour  Intake   1660 ml  Output   1600 ml  Net     60 ml     Physical Exam: General: . Obese woman. Sitting in chair NAD.  Neck: Neck supple, hard to see JVP but appears to ear; No masses, thyromegaly or abnormal cervical nodes.  Lungs: Clear bilaterally  Heart: Non-displaced PMI, chest non-tender; regular rate and rhythm, S1, S2 without rubs or gallops. 2/6 early systolic murmur RUSB. Carotid upstroke normal, no bruit. Pedals normal pulses. 3+ edema into thighs (UNNA boots intact) Abdomen: Obese soft and non-tender without masses, distended; organomegaly, or hernias noted. No hepatosplenomegaly.  Extremities: No clubbing or cyanosis.  Neurologic: Alert and oriented x 3.   Telemetry: NSR 60s  Labs: Basic Metabolic Panel:  Recent Labs Lab 07/12/14 2000 07/13/14 0454 07/14/14 0416 07/15/14 0410  NA 139 141 137 135  K 4.9 4.6 4.5 4.4  CL 98 101 96 98  CO2 25 27 26 28   GLUCOSE 249* 171* 245* 179*  BUN 27* 28* 32* 30*  CREATININE 1.57* 1.81* 2.10* 1.81*  CALCIUM 8.8 8.7 9.0 8.5   MG  --   --  1.7  --     Liver Function Tests:  Recent Labs Lab 07/13/14 0454  AST 12  ALT 9  ALKPHOS 64  BILITOT 0.3  PROT 6.4  ALBUMIN 2.8*   No results for input(s): LIPASE, AMYLASE in the last 168 hours. No results for input(s): AMMONIA in the last 168 hours.  CBC:  Recent Labs Lab 07/12/14 2000 07/13/14 0454 07/14/14 0416 07/15/14 0410  WBC 8.9 7.6 7.0 8.8  NEUTROABS  --  5.8  --   --   HGB 9.9* 9.2* 8.8* 9.2*  HCT 31.5* 30.1* 29.0* 30.2*  MCV 83.3 84.8 84.5 83.2  PLT 195 193 187 181    Cardiac Enzymes:  Recent Labs Lab 07/13/14 1000 07/13/14 1457 07/13/14 2120  TROPONINI 0.49* 0.46* <0.30    BNP: BNP (last 3 results)  Recent Labs  06/03/14 1504 06/24/14 1640 07/12/14 2000  PROBNP 250.0* 3974.0* 3324.0*     Other results:    Imaging: No results found.   Medications:     Scheduled Medications: . aspirin EC  81 mg Oral Daily  . atorvastatin  40 mg Oral QHS  . DULoxetine  60 mg Oral Daily  . ferrous sulfate  325 mg Oral TID WC  . furosemide  80 mg Intravenous 3 times per day  . gabapentin  400 mg Oral BID  . heparin subcutaneous  5,000 Units Subcutaneous 3 times per day  . insulin aspart  0-15 Units Subcutaneous 4 times per day  . insulin aspart  3 Units Subcutaneous TID WC  . insulin glargine  45 Units Subcutaneous QHS  . levofloxacin (LEVAQUIN) IV  750 mg Intravenous Q48H  . metolazone  2.5 mg Oral BID  . oxybutynin  10 mg Oral q morning - 10a  . potassium chloride  20 mEq Oral BID  . pregabalin  75 mg Oral BID  . rOPINIRole  2 mg Oral QHS  . traZODone  75 mg Oral QHS    Infusions:    PRN Medications: acetaminophen, albuterol   Assessment:   1. A/C diastolic HF 2. A/c respiratory failure 3. CAD with previous LCX stents 4. Mildly elevated troponin 5. Morbid obesity 6. Severe OSA 7. Mild AS 8. CKD stage III  Plan/Discussion:    She continues to have volume overload even with increase in diuretics. Weight is  up and BMET pending. Will continue lasix 80 mg TID and increase metolazone 5 mg BID. Suspect dietary indiscretion.   Continue to follow renal function closely. Follow K+ closely.   Suspect minimally elevated troponin and chest pressure due to volume overload and not ACS.    Length of Stay: 4 Aundria RudCosgrove, Ali B NP-C 07/16/2014, 9:17 AM  Advanced Heart Failure Team Pager [phone] (M-F; 7a - 4p)  Please contact [facility] for night-coverage after hours (4p -7a ) and weekends on [email or web address]  Patient seen and examined with Ulla PotashAli Cosgrove, NP. We discussed all aspects of the encounter. I agree with the assessment and plan as stated above.   She continues to gain weight despite increasing diuretics. Counseled on need for salt and fluid restriction. Agree with adding metolazone 5 bid. (IV alsix increased yesterday). Will add spiro 12.5 to help prevent massive K losses.  [name] now about 40 pounds up from baseline weight of 218 in clinic on 12/1.   Daniel Bensimhon,MD 9:44 AM

## 2014-07-17 LAB — BASIC METABOLIC PANEL
Anion gap: 11 (ref 5–15)
BUN: 30 mg/dL — ABNORMAL HIGH (ref 6–23)
CALCIUM: 9.3 mg/dL (ref 8.4–10.5)
CHLORIDE: 94 meq/L — AB (ref 96–112)
CO2: 32 mmol/L (ref 19–32)
Creatinine, Ser: 1.89 mg/dL — ABNORMAL HIGH (ref 0.50–1.10)
GFR calc Af Amer: 31 mL/min — ABNORMAL LOW (ref >90)
GFR calc non Af Amer: 27 mL/min — ABNORMAL LOW (ref >90)
GLUCOSE: 171 mg/dL — AB (ref 70–99)
Potassium: 4.1 mmol/L (ref 3.5–5.1)
Sodium: 137 mmol/L (ref 135–145)

## 2014-07-17 LAB — GLUCOSE, CAPILLARY
GLUCOSE-CAPILLARY: 249 mg/dL — AB (ref 70–99)
GLUCOSE-CAPILLARY: 210 mg/dL — AB (ref 70–99)
GLUCOSE-CAPILLARY: 263 mg/dL — AB (ref 70–99)
Glucose-Capillary: 176 mg/dL — ABNORMAL HIGH (ref 70–99)

## 2014-07-17 MED ORDER — POLYETHYLENE GLYCOL 3350 17 G PO PACK
17.0000 g | PACK | Freq: Two times a day (BID) | ORAL | Status: DC
  Administered 2014-07-17 – 2014-07-19 (×4): 17 g via ORAL
  Filled 2014-07-17 (×6): qty 1

## 2014-07-17 MED ORDER — INSULIN ASPART 100 UNIT/ML ~~LOC~~ SOLN
6.0000 [IU] | Freq: Three times a day (TID) | SUBCUTANEOUS | Status: DC
  Administered 2014-07-17 (×2): 6 [IU] via SUBCUTANEOUS

## 2014-07-17 MED ORDER — INSULIN ASPART 100 UNIT/ML ~~LOC~~ SOLN
0.0000 [IU] | Freq: Three times a day (TID) | SUBCUTANEOUS | Status: DC
  Administered 2014-07-17: 5 [IU] via SUBCUTANEOUS
  Administered 2014-07-17: 3 [IU] via SUBCUTANEOUS
  Administered 2014-07-17: 5 [IU] via SUBCUTANEOUS
  Administered 2014-07-17: 8 [IU] via SUBCUTANEOUS

## 2014-07-17 NOTE — Progress Notes (Signed)
PROGRESS NOTE  Krista KalataMary O Wise EAV:[id number] DOB: 1945-11-02 DOA: 07/12/2014 PCP: [name],[name], NP  HPI: Patient admitted with sudden onset shortness of breath and chest pain, low grade temp in the ED, an a CXR with pulmonary edema without convincing evidence of pneumonia.   Interim: Patient found to have troponin elevation and worsening fluid overload, currently undergoing IV diuresis with fairly stable renal function. Cardiology following.     Subjective / 24 H Interval events   Patient in chair, no headache, no chest abdominal pain. No shortness of breath currently. Still has edema.  Assessment/Plan:   Acute respiratory failure with hypoxia -  due to committee acquired pneumonia and acute on chronic diastolic CHF. Off antibiotics, being diuresed by cardiology via Lasix + Zaroxolyn, oxygen and Nebulizer treatments as needed. Slow improvement.    Acute on chronic diastolic heart failure Ef 55%  Cardiology monitoring diuretics, baseline weight 218 pounds, still about 35-40 pounds over. Heart healthy diet and fluid restriction added.    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     DM 2-  Poor control, increased Lantus and pre-meal NovoLog, continue sliding scale  Lab Results  Component Value Date   HGBA1C 8.1* 07/07/2013    CBG (last 3)   Recent Labs  07/16/14 1201 07/16/14 1626 07/16/14 2131  GLUCAP 265* 220* 282*     Possible CAP & UTI - finished antibiotic treatment.   Elevated troponin  likely demand in the setting of CAP and CHF,  appreciate cardiology assistance, for further cardiac workup. On aspirin and statin. Beta blocker deferred to cardiology.    Anemia - she is iron deficient, no evidence of bleeding, due to chronic illness and CKD likely. Oral iron increased.     RLS - continue requip   Neuropathy - continue Lyrica   Weakness - patient unable to stand due to  neuropathy, PT consult     Diet: Diet 2 gram sodium, added 1.2 lit fluid/day restriction Fluids: none DVT Prophylaxis: SCD  Code Status: Full Code Family Communication: d/w patient  Disposition Plan: remain inpatient  Consultants:  Cardiology   Procedures:  None    Antibiotics  Anti-infectives    Start     Dose/Rate Route Frequency Ordered Stop   07/13/14 0200  levofloxacin (LEVAQUIN) IVPB 750 mg     750 mg100 mL/hr over 90 Minutes Intravenous Every 48 hours 07/13/14 0143 07/17/14 0009   07/12/14 2300  azithromycin (ZITHROMAX) 500 mg in dextrose 5 % 250 mL IVPB     500 mg250 mL/hr over 60 Minutes Intravenous  Once 07/12/14 2259 07/13/14 0053   07/12/14 2300  cefTRIAXone (ROCEPHIN) 1 g in dextrose 5 % 50 mL IVPB     1 g100 mL/hr over 30 Minutes Intravenous  Once 07/12/14 2259 07/12/14 2349        Studies  No results found.   Objective  Filed Vitals:   07/16/14 0900 07/16/14 1300 07/16/14 2129 07/17/14 0618  BP:  128/58 136/58 100/76  Pulse:  78 81 72  Temp:  97.8 F (36.6 C) 98.5 F (36.9 C) 97.5 F (36.4 C)  TempSrc:  Oral Oral Oral  Resp:  18 18 18   Height:      Weight: 117.7 kg (259 lb 7.7 oz)   114.5 kg (252 lb 6.8 oz)  SpO2:  92% 98% 98%    Intake/Output Summary (Last 24 hours) at 07/17/14 1037 Last data filed at  07/17/14 1026  Gross per 24 hour  Intake   1595 ml  Output   3500 ml  Net  -1905 ml   Filed Weights   07/16/14 [id number] 07/16/14 0900 07/17/14 0618  Weight: 117.346 kg (258 lb 11.2 oz) 117.7 kg (259 lb 7.7 oz) 114.5 kg (252 lb 6.8 oz)   Baseline weight 218 pounds   Exam:  General:  NAD  Cardiovascular: RRR  Respiratory: fine crackles at the bases  Abdomen: obese, non tender  MSK: 1-2+ edema  Neuro: non focal  Data Reviewed: Basic Metabolic Panel:  Recent Labs Lab 07/13/14 0454 07/14/14 0416 07/15/14 0410 07/16/14 1110 07/17/14 0405  NA 141 137 135 135 137  K 4.6 4.5 4.4 4.4 4.1  CL 101 96 98 97 94*  CO2 27 26  28 29  32  GLUCOSE 171* 245* 179* 314* 171*  BUN 28* 32* 30* 29* 30*  CREATININE 1.81* 2.10* 1.81* 1.72* 1.89*  CALCIUM 8.7 9.0 8.5 8.9 9.3  MG  --  1.7  --   --   --    Liver Function Tests:  Recent Labs Lab 07/13/14 0454  AST 12  ALT 9  ALKPHOS 64  BILITOT 0.3  PROT 6.4  ALBUMIN 2.8*   CBC:  Recent Labs Lab 07/12/14 2000 07/13/14 0454 07/14/14 0416 07/15/14 0410  WBC 8.9 7.6 7.0 8.8  NEUTROABS  --  5.8  --   --   HGB 9.9* 9.2* 8.8* 9.2*  HCT 31.5* 30.1* 29.0* 30.2*  MCV 83.3 84.8 84.5 83.2  PLT 195 193 187 181   Cardiac Enzymes:  Recent Labs Lab 07/13/14 1000 07/13/14 1457 07/13/14 2120  TROPONINI 0.49* 0.46* <0.30   BNP (last 3 results)  Recent Labs  06/03/14 1504 06/24/14 1640 07/12/14 2000  PROBNP 250.0* 3974.0* 3324.0*   CBG:  Recent Labs Lab 07/15/14 2050 07/16/14 0638 07/16/14 1201 07/16/14 1626 07/16/14 2131  GLUCAP 232* 210* 265* 220* 282*    Recent Results (from the past 240 hour(s))  Blood culture (routine x 2)     Status: None (Preliminary result)   Collection Time: 07/12/14  8:00 PM  Result Value Ref Range Status   Specimen Description BLOOD LEFT ARM  Final   Special Requests BOTTLES DRAWN AEROBIC AND ANAEROBIC 10CC EACH  Final   Culture  Setup Time   Final    07/13/2014 04:14 Performed at [facility]    Culture   Final           BLOOD CULTURE RECEIVED NO GROWTH TO DATE CULTURE WILL BE HELD FOR 5 DAYS BEFORE ISSUING A FINAL NEGATIVE REPORT Note: Culture results may be compromised due to an excessive volume of blood received in culture bottles. Performed at [facility]    Report Status PENDING  Incomplete  Blood culture (routine x 2)     Status: None (Preliminary result)   Collection Time: 07/12/14  8:05 PM  Result Value Ref Range Status   Specimen Description BLOOD RIGHT ARM  Final   Special Requests BOTTLES DRAWN AEROBIC ONLY 10CC  Final   Culture  Setup Time   Final    07/13/2014 04:14 Performed at  [facility]    Culture   Final           BLOOD CULTURE RECEIVED NO GROWTH TO DATE CULTURE WILL BE HELD FOR 5 DAYS BEFORE ISSUING A FINAL NEGATIVE REPORT Performed at [facility]    Report Status PENDING  Incomplete  Urine  culture     Status: None   Collection Time: 07/12/14 11:19 PM  Result Value Ref Range Status   Specimen Description URINE, CATHETERIZED  Final   Special Requests NONE  Final   Culture  Setup Time   Final    07/13/2014 05:45 Performed at [facility]    Colony Count   Final    >=100,000 COLONIES/ML Performed at [facility]    Culture   Final    ESCHERICHIA COLI Performed at [facility]    Report Status 07/15/2014 FINAL  Final   Organism ID, Bacteria ESCHERICHIA COLI  Final      Susceptibility   Escherichia coli - MIC*    AMPICILLIN <=2 SENSITIVE Sensitive     CEFAZOLIN <=4 SENSITIVE Sensitive     CEFTRIAXONE <=1 SENSITIVE Sensitive     CIPROFLOXACIN <=0.25 SENSITIVE Sensitive     GENTAMICIN <=1 SENSITIVE Sensitive     LEVOFLOXACIN <=0.12 SENSITIVE Sensitive     NITROFURANTOIN <=16 SENSITIVE Sensitive     TOBRAMYCIN <=1 SENSITIVE Sensitive     TRIMETH/SULFA <=20 SENSITIVE Sensitive     PIP/TAZO <=4 SENSITIVE Sensitive     * ESCHERICHIA COLI     Scheduled Meds: . aspirin EC  81 mg Oral Daily  . atorvastatin  40 mg Oral QHS  . DULoxetine  60 mg Oral Daily  . ferrous sulfate  325 mg Oral TID WC  . furosemide  80 mg Intravenous 3 times per day  . gabapentin  600 mg Oral TID  . heparin subcutaneous  5,000 Units Subcutaneous 3 times per day  . insulin aspart  0-15 Units Subcutaneous TID AC & HS  . insulin aspart  3 Units Subcutaneous TID WC  . insulin glargine  55 Units Subcutaneous QHS  . metolazone  5 mg Oral BID  . oxybutynin  10 mg Oral q morning - 10a  . polyethylene glycol  17 g Oral BID  . potassium chloride  20 mEq Oral BID  . rOPINIRole  2 mg Oral QHS  . traZODone  75 mg Oral QHS   Continuous  Infusions:   Time spent: 25 minutes  Susa Raring K M.D on 07/17/2014 at 10:37 AM  Between 7am to 7pm - Pager - [phone], After 7pm go to [email or web address] - password [email or web address]  And look for the night coverage person covering me after hours  [facility]  Office  [phone]  07/17/2014, 10:37 AM  LOS: 5 days

## 2014-07-17 NOTE — Progress Notes (Addendum)
Physical Therapy Treatment Patient Details Name: Krista Wise MRN: [id number] DOB: 1945-10-30 Today's Date: 07/17/2014    History of Present Illness Patient is a 68 yo female admitted 07/12/14 with SOB, LE edema.  Patient with acute resp failure with hypoxia, CHF exacerbation, poorly controlled DM with neuropathy.  PMH:  CAD, pulm HTN, CHF, OSA, DM, neuropathy, HTN, obesity, depression, back surgery.    PT Comments    Patient progressing well, Improved activity tolerance this date, ambulated extended distance with no reports of dyspnea and SpO2 88% on room air, rebounded to 93% on room air with 1 minute rest.    Follow Up Recommendations  Home health PT;Supervision for mobility/OOB     Equipment Recommendations  None recommended by PT    Recommendations for Other Services       Precautions / Restrictions Precautions Precautions: Fall Precaution Comments: Significant neuropathy BLE's impacting balance Restrictions Weight Bearing Restrictions: No    Mobility  Bed Mobility Overal bed mobility: Needs Assistance Bed Mobility: Supine to Sit     Supine to sit: Min assist     General bed mobility comments: VCs for positioning and sequencing  Transfers Overall transfer level: Needs assistance Equipment used: Rolling walker (2 wheeled) Transfers: Sit to/from Stand Sit to Stand: Supervision            Ambulation/Gait Ambulation/Gait assistance: Supervision Ambulation Distance (Feet): 320 Feet (one standing rest break to assess VS) Assistive device: Rolling walker (2 wheeled) Gait Pattern/deviations: Step-through pattern;Decreased stride length;Shuffle;Decreased dorsiflexion - right;Decreased dorsiflexion - left;Wide base of support Gait velocity: improved cadence this session Gait velocity interpretation: Below normal speed for age/gender General Gait Details: Improved activity tolerance this date, ambulated extended distance with no reports of dyspnea and SpO2 88%  on room air, rebounded to 93% on room air with 1 minute rest   Stairs            Wheelchair Mobility    Modified Rankin (Stroke Patients Only)       Balance             Standing balance-Leahy Scale: Fair                      Cognition Arousal/Alertness: Awake/alert Behavior During Therapy: WFL for tasks assessed/performed Overall Cognitive Status: Within Functional Limits for tasks assessed                      Exercises      General Comments        Pertinent Vitals/Pain Pain Assessment: No/denies pain    Home Living                      Prior Function            PT Goals (current goals can now be found in the care plan section) Acute Rehab PT Goals Patient Stated Goal: To go home soon PT Goal Formulation: With patient/family Time For Goal Achievement: 07/21/14 Potential to Achieve Goals: Good Progress towards PT goals: Progressing toward goals    Frequency  Min 4X/week    PT Plan Current plan remains appropriate    Co-evaluation             End of Session Equipment Utilized During Treatment: Gait belt;Oxygen Activity Tolerance: Patient limited by fatigue Patient left: in chair;with call bell/phone within reach     Time: [id number] PT Time Calculation (min) (ACUTE ONLY): 18 min  Charges:  $  Gait Training: 8-22 mins                    G CodesFabio Asa:      [name] 07/17/2014, 10:20 AM [name], PT DPT  [phone]

## 2014-07-17 NOTE — Progress Notes (Signed)
Advanced Heart Failure Rounding Note   Subjective:    Continues to diurese. Weight down 7 lbs and 24 hr I/O -1.8 liters. Creatinine up slightly. Denies SOB or CP. Ambulating in the halls.    Objective:   Weight Range:  Vital Signs:   Temp:  [97.5 F (36.4 C)-98.5 F (36.9 C)] 97.5 F (36.4 C) (12/24 0618) Pulse Rate:  [72-81] 72 (12/24 0618) Resp:  [18] 18 (12/24 0618) BP: (100-136)/(58-76) 100/76 mmHg (12/24 0618) SpO2:  [98 %] 98 % (12/24 0618) Weight:  [252 lb 6.8 oz (114.5 kg)] 252 lb 6.8 oz (114.5 kg) (12/24 0618) Last BM Date: 07/12/14  Weight change: Filed Weights   07/16/14 0633 07/16/14 0900 07/17/14 0618  Weight: 258 lb 11.2 oz (117.346 kg) 259 lb 7.7 oz (117.7 kg) 252 lb 6.8 oz (114.5 kg)    Intake/Output:   Intake/Output Summary (Last 24 hours) at 07/17/14 1304 Last data filed at 07/17/14 1108  Gross per 24 hour  Intake   1355 ml  Output   4200 ml  Net  -2845 ml     Physical Exam: General: . Obese woman. Sitting in chair NAD.  Neck: Neck supple, hard to see JVP but appears to ear; No masses, thyromegaly or abnormal cervical nodes.  Lungs: Clear bilaterally  Heart: Non-displaced PMI, chest non-tender; regular rate and rhythm, S1, S2 without rubs or gallops. 2/6 early systolic murmur RUSB. Carotid upstroke normal, no bruit. Pedals normal pulses. 3+ edema into thighs (UNNA boots intact) Abdomen: Obese soft and non-tender without masses, distended; organomegaly, or hernias noted. No hepatosplenomegaly.  Extremities: No clubbing or cyanosis.  Neurologic: Alert and oriented x 3.   Telemetry: NSR 60s  Labs: Basic Metabolic Panel:  Recent Labs Lab 07/13/14 0454 07/14/14 0416 07/15/14 0410 07/16/14 1110 07/17/14 0405  NA 141 137 135 135 137  K 4.6 4.5 4.4 4.4 4.1  CL 101 96 98 97 94*  CO2 27 26 28 29  32  GLUCOSE 171* 245* 179* 314* 171*  BUN 28* 32* 30* 29* 30*  CREATININE 1.81* 2.10* 1.81* 1.72* 1.89*  CALCIUM 8.7 9.0 8.5 8.9 9.3  MG  --  1.7   --   --   --     Liver Function Tests:  Recent Labs Lab 07/13/14 0454  AST 12  ALT 9  ALKPHOS 64  BILITOT 0.3  PROT 6.4  ALBUMIN 2.8*   No results for input(s): LIPASE, AMYLASE in the last 168 hours. No results for input(s): AMMONIA in the last 168 hours.  CBC:  Recent Labs Lab 07/12/14 2000 07/13/14 0454 07/14/14 0416 07/15/14 0410  WBC 8.9 7.6 7.0 8.8  NEUTROABS  --  5.8  --   --   HGB 9.9* 9.2* 8.8* 9.2*  HCT 31.5* 30.1* 29.0* 30.2*  MCV 83.3 84.8 84.5 83.2  PLT 195 193 187 181    Cardiac Enzymes:  Recent Labs Lab 07/13/14 1000 07/13/14 1457 07/13/14 2120  TROPONINI 0.49* 0.46* <0.30    BNP: BNP (last 3 results)  Recent Labs  06/03/14 1504 06/24/14 1640 07/12/14 2000  PROBNP 250.0* 3974.0* 3324.0*     Other results:    Imaging: No results found.   Medications:     Scheduled Medications: . aspirin EC  81 mg Oral Daily  . atorvastatin  40 mg Oral QHS  . DULoxetine  60 mg Oral Daily  . ferrous sulfate  325 mg Oral TID WC  . furosemide  80 mg Intravenous 3 times per day  .  gabapentin  600 mg Oral TID  . heparin subcutaneous  5,000 Units Subcutaneous 3 times per day  . insulin aspart  0-15 Units Subcutaneous TID AC & HS  . insulin aspart  6 Units Subcutaneous TID WC  . insulin glargine  55 Units Subcutaneous QHS  . metolazone  5 mg Oral BID  . oxybutynin  10 mg Oral q morning - 10a  . polyethylene glycol  17 g Oral BID  . potassium chloride  20 mEq Oral BID  . rOPINIRole  2 mg Oral QHS  . traZODone  75 mg Oral QHS    Infusions:    PRN Medications: acetaminophen, albuterol   Assessment:   1. A/C diastolic HF 2. A/c respiratory failure 3. CAD with previous LCX stents 4. Mildly elevated troponin 5. Morbid obesity 6. Severe OSA 7. Mild AS 8. CKD stage III  Plan/Discussion:    Diuretics increased yesterday and remains volume overloaded. Will continue current regimen. Renal function stable will continue to follow.     Suspect minimally elevated troponin and chest pressure due to volume overload and not ACS.   Can discontinue telemetry.    Length of Stay: 5 Aundria RudCosgrove, Ali B NP-C 07/17/2014, 1:04 PM  Advanced Heart Failure Team Pager [phone] (M-F; 7a - 4p)  Please contact [facility] for night-coverage after hours (4p -7a ) and weekends on [email or web address]  Patient seen and examined with Ulla PotashAli Cosgrove, NP. We discussed all aspects of the encounter. I agree with the assessment and plan as stated above.   [name] beginning to diurese. Long talk about need for dietary restriction. Will continue current regimen.   Daniel Bensimhon,MD 2:09 PM

## 2014-07-18 ENCOUNTER — Inpatient Hospital Stay (HOSPITAL_COMMUNITY): Payer: Medicare Other

## 2014-07-18 LAB — CARBOXYHEMOGLOBIN
Carboxyhemoglobin: 1.5 % (ref 0.5–1.5)
Methemoglobin: 1.2 % (ref 0.0–1.5)
O2 SAT: 70.4 %
Total hemoglobin: 10.7 g/dL — ABNORMAL LOW (ref 12.0–16.0)

## 2014-07-18 LAB — GLUCOSE, CAPILLARY
GLUCOSE-CAPILLARY: 208 mg/dL — AB (ref 70–99)
GLUCOSE-CAPILLARY: 215 mg/dL — AB (ref 70–99)
Glucose-Capillary: 197 mg/dL — ABNORMAL HIGH (ref 70–99)
Glucose-Capillary: 253 mg/dL — ABNORMAL HIGH (ref 70–99)
Glucose-Capillary: 208 mg/dL — ABNORMAL HIGH (ref 70–99)

## 2014-07-18 LAB — BASIC METABOLIC PANEL
Anion gap: 11 (ref 5–15)
BUN: 36 mg/dL — AB (ref 6–23)
CO2: 32 mmol/L (ref 19–32)
Calcium: 9.5 mg/dL (ref 8.4–10.5)
Chloride: 94 mEq/L — ABNORMAL LOW (ref 96–112)
Creatinine, Ser: 2.3 mg/dL — ABNORMAL HIGH (ref 0.50–1.10)
GFR, EST AFRICAN AMERICAN: 24 mL/min — AB (ref >90)
GFR, EST NON AFRICAN AMERICAN: 21 mL/min — AB (ref >90)
Glucose, Bld: 188 mg/dL — ABNORMAL HIGH (ref 70–99)
POTASSIUM: 4.4 mmol/L (ref 3.5–5.1)
SODIUM: 137 mmol/L (ref 135–145)

## 2014-07-18 LAB — MRSA PCR SCREENING: MRSA BY PCR: NEGATIVE

## 2014-07-18 MED ORDER — FERROUS SULFATE 325 (65 FE) MG PO TABS
325.0000 mg | ORAL_TABLET | Freq: Three times a day (TID) | ORAL | Status: DC
  Administered 2014-07-18 – 2014-07-19 (×4): 325 mg via ORAL
  Filled 2014-07-18 (×7): qty 1

## 2014-07-18 MED ORDER — INSULIN ASPART 100 UNIT/ML ~~LOC~~ SOLN
6.0000 [IU] | Freq: Three times a day (TID) | SUBCUTANEOUS | Status: DC
  Administered 2014-07-18 – 2014-07-19 (×4): 6 [IU] via SUBCUTANEOUS

## 2014-07-18 MED ORDER — GABAPENTIN 300 MG PO CAPS
600.0000 mg | ORAL_CAPSULE | Freq: Two times a day (BID) | ORAL | Status: DC
  Administered 2014-07-18 – 2014-07-19 (×3): 600 mg via ORAL
  Filled 2014-07-18 (×4): qty 2

## 2014-07-18 MED ORDER — MIDAZOLAM HCL 2 MG/2ML IJ SOLN
INTRAMUSCULAR | Status: AC
  Administered 2014-07-18: 1 mg
  Filled 2014-07-18: qty 2

## 2014-07-18 MED ORDER — INSULIN ASPART 100 UNIT/ML ~~LOC~~ SOLN
0.0000 [IU] | Freq: Three times a day (TID) | SUBCUTANEOUS | Status: DC
Start: 2014-07-18 — End: 2014-07-19
  Administered 2014-07-18: 3 [IU] via SUBCUTANEOUS
  Administered 2014-07-18: 5 [IU] via SUBCUTANEOUS
  Administered 2014-07-18: 8 [IU] via SUBCUTANEOUS
  Administered 2014-07-18: 5 [IU] via SUBCUTANEOUS
  Administered 2014-07-19: 8 [IU] via SUBCUTANEOUS

## 2014-07-18 MED ORDER — METOLAZONE 5 MG PO TABS
5.0000 mg | ORAL_TABLET | Freq: Two times a day (BID) | ORAL | Status: DC
  Filled 2014-07-18 (×3): qty 1

## 2014-07-18 MED ORDER — OXYCODONE-ACETAMINOPHEN 5-325 MG PO TABS: 1.0000 | ORAL_TABLET | Freq: Three times a day (TID) | ORAL | Status: DC | PRN

## 2014-07-18 MED ORDER — FUROSEMIDE 10 MG/ML IJ SOLN: 80.0000 mg | Freq: Two times a day (BID) | INTRAMUSCULAR | Status: DC

## 2014-07-18 MED ORDER — INSULIN GLARGINE 100 UNIT/ML ~~LOC~~ SOLN
60.0000 [IU] | Freq: Every day | SUBCUTANEOUS | Status: DC
  Administered 2014-07-18: 60 [IU] via SUBCUTANEOUS
  Filled 2014-07-18 (×3): qty 0.6

## 2014-07-18 MED ORDER — FENTANYL CITRATE 0.05 MG/ML IJ SOLN
INTRAMUSCULAR | Status: AC
  Administered 2014-07-18: 25 ug
  Filled 2014-07-18: qty 2

## 2014-07-18 MED ORDER — METOLAZONE 5 MG PO TABS
5.0000 mg | ORAL_TABLET | Freq: Two times a day (BID) | ORAL | Status: DC
  Filled 2014-07-18: qty 1

## 2014-07-18 NOTE — Progress Notes (Addendum)
PROGRESS NOTE  Krista Wise HQI:[id number] DOB: 1946-03-05 DOA: 07/12/2014 PCP: [name],[name], NP  HPI: Patient admitted with sudden onset shortness of breath and chest pain, low grade temp in the ED, an a CXR with pulmonary edema without convincing evidence of pneumonia.   Interim: Patient found to have troponin elevation and worsening fluid overload, currently undergoing IV diuresis with fairly stable renal function. Cardiology following.     Subjective / 24 H Interval events   Patient in chair, no headache, no chest abdominal pain. No shortness of breath currently. Still has edema.  Assessment/Plan:   Acute respiratory failure with hypoxia -  due to CAP and acute on chronic diastolic CHF. Off antibiotics, being diuresed, oxygen and Nebulizer treatments as needed. Slow improvement.     Acute on chronic diastolic heart failure Ef 55%  Cardiology monitoring, baseline weight 218 pounds, still about 35-40 pounds over. Heart healthy diet and fluid restriction added. Since renal function has worsened have requested renal to evaluate and adjust diuretics, she was on Lasix 60 mg 3 times a day have cut it back to twice a day on 07/18/2014 and skipped her Zaroxolyn today. She probably will require diuretics at lower dose.    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     DM 2-  Poor control, increased Lantus to 60  added pre-meal NovoLog, continue sliding scale  Lab Results  Component Value Date   HGBA1C 8.1* 07/07/2013    CBG (last 3)   Recent Labs  07/17/14 1651 07/17/14 2232 07/18/14 0628  GLUCAP 210* 263* 197*     Possible CAP & UTI - finished antibiotic treatment.    Elevated troponin  likely demand in the setting of CAP and CHF,  appreciate cardiology assistance, for further cardiac workup. On aspirin and statin. Beta blocker deferred to  cardiology.    Anemia - she is iron deficient, no evidence of bleeding, due to chronic illness and CKD likely. Oral iron increased.     RLS - continue requip   Neuropathy - continue Lyrica   Weakness - patient unable to stand due to neuropathy, PT consult     Diet: Diet 2 gram sodium, added 1.2 lit fluid/day restriction Fluids: none DVT Prophylaxis: Heparin  Code Status: Full Code Family Communication: d/w patient  Disposition Plan: remain inpatient  Consultants:  Cardiology , Renal  Procedures:  None    Antibiotics  Anti-infectives    Start     Dose/Rate Route Frequency Ordered Stop   07/13/14 0200  levofloxacin (LEVAQUIN) IVPB 750 mg     750 mg100 mL/hr over 90 Minutes Intravenous Every 48 hours 07/13/14 0143 07/17/14 0009   07/12/14 2300  azithromycin (ZITHROMAX) 500 mg in dextrose 5 % 250 mL IVPB     500 mg250 mL/hr over 60 Minutes Intravenous  Once 07/12/14 2259 07/13/14 0053   07/12/14 2300  cefTRIAXone (ROCEPHIN) 1 g in dextrose 5 % 50 mL IVPB     1 g100 mL/hr over 30 Minutes Intravenous  Once 07/12/14 2259 07/12/14 2349        Studies  No results found.   Objective  Filed Vitals:   07/17/14 0618 07/17/14 1430 07/17/14 2324 07/18/14 0539  BP: 100/76 130/54 124/60 115/47  Pulse: 72 71 69 74  Temp: 97.5 F (36.4 C) 97.8 F (36.6 C)  97.8 F (36.6 C)  TempSrc: Oral Oral  Oral  Resp: 18 18 18 19   Height:      Weight: 114.5 kg (252 lb 6.8 oz)   113.4 kg (250 lb)  SpO2: 98% 100% 98% 95%    Intake/Output Summary (Last 24 hours) at 07/18/14 [id number] Last data filed at 07/18/14 [id number]  Gross per 24 hour  Intake   1420 ml  Output   3250 ml  Net  -1830 ml   Filed Weights   07/16/14 0900 07/17/14 0618 07/18/14 0539  Weight: 117.7 kg (259 lb 7.7 oz) 114.5 kg (252 lb 6.8 oz) 113.4 kg (250 lb)   Baseline weight 218 pounds   Exam:  General:  NAD  Cardiovascular: RRR  Respiratory: fine crackles at the bases  Abdomen: obese, non  tender  MSK: 1-2+ edema  Neuro: non focal  Data Reviewed: Basic Metabolic Panel:  Recent Labs Lab 07/14/14 0416 07/15/14 0410 07/16/14 1110 07/17/14 0405 07/18/14 0348  NA 137 135 135 137 137  K 4.5 4.4 4.4 4.1 4.4  CL 96 98 97 94* 94*  CO2 26 28 29  32 32  GLUCOSE 245* 179* 314* 171* 188*  BUN 32* 30* 29* 30* 36*  CREATININE 2.10* 1.81* 1.72* 1.89* 2.30*  CALCIUM 9.0 8.5 8.9 9.3 9.5  MG 1.7  --   --   --   --    Liver Function Tests:  Recent Labs Lab 07/13/14 0454  AST 12  ALT 9  ALKPHOS 64  BILITOT 0.3  PROT 6.4  ALBUMIN 2.8*   CBC:  Recent Labs Lab 07/12/14 2000 07/13/14 0454 07/14/14 0416 07/15/14 0410  WBC 8.9 7.6 7.0 8.8  NEUTROABS  --  5.8  --   --   HGB 9.9* 9.2* 8.8* 9.2*  HCT 31.5* 30.1* 29.0* 30.2*  MCV 83.3 84.8 84.5 83.2  PLT 195 193 187 181   Cardiac Enzymes:  Recent Labs Lab 07/13/14 1000 07/13/14 1457 07/13/14 2120  TROPONINI 0.49* 0.46* <0.30   BNP (last 3 results)  Recent Labs  06/03/14 1504 06/24/14 1640 07/12/14 2000  PROBNP 250.0* 3974.0* 3324.0*   CBG:  Recent Labs Lab 07/17/14 0644 07/17/14 1107 07/17/14 1651 07/17/14 2232 07/18/14 0628  GLUCAP 176* 249* 210* 263* 197*    Recent Results (from the past 240 hour(s))  Blood culture (routine x 2)     Status: None (Preliminary result)   Collection Time: 07/12/14  8:00 PM  Result Value Ref Range Status   Specimen Description BLOOD LEFT ARM  Final   Special Requests BOTTLES DRAWN AEROBIC AND ANAEROBIC 10CC EACH  Final   Culture  Setup Time   Final    07/13/2014 04:14 Performed at [facility]    Culture   Final           BLOOD CULTURE RECEIVED NO GROWTH TO DATE CULTURE WILL BE HELD FOR 5 DAYS BEFORE ISSUING A FINAL NEGATIVE REPORT Note: Culture results may be compromised due to an excessive volume of blood received in culture bottles. Performed at [facility]    Report Status PENDING  Incomplete  Blood culture (routine x 2)     Status:  None (Preliminary result)   Collection Time: 07/12/14  8:05 PM  Result Value Ref Range Status   Specimen Description BLOOD RIGHT ARM  Final   Special Requests BOTTLES DRAWN AEROBIC ONLY 10CC  Final   Culture  Setup Time   Final    07/13/2014 04:14 Performed at [facility]    Culture   Final  BLOOD CULTURE RECEIVED NO GROWTH TO DATE CULTURE WILL BE HELD FOR 5 DAYS BEFORE ISSUING A FINAL NEGATIVE REPORT Performed at [facility]    Report Status PENDING  Incomplete  Urine culture     Status: None   Collection Time: 07/12/14 11:19 PM  Result Value Ref Range Status   Specimen Description URINE, CATHETERIZED  Final   Special Requests NONE  Final   Culture  Setup Time   Final    07/13/2014 05:45 Performed at [facility]   Final    >=100,000 COLONIES/ML Performed at [facility]    Culture   Final    ESCHERICHIA COLI Performed at [facility]    Report Status 07/15/2014 FINAL  Final   Organism ID, Bacteria ESCHERICHIA COLI  Final      Susceptibility   Escherichia coli - MIC*    AMPICILLIN <=2 SENSITIVE Sensitive     CEFAZOLIN <=4 SENSITIVE Sensitive     CEFTRIAXONE <=1 SENSITIVE Sensitive     CIPROFLOXACIN <=0.25 SENSITIVE Sensitive     GENTAMICIN <=1 SENSITIVE Sensitive     LEVOFLOXACIN <=0.12 SENSITIVE Sensitive     NITROFURANTOIN <=16 SENSITIVE Sensitive     TOBRAMYCIN <=1 SENSITIVE Sensitive     TRIMETH/SULFA <=20 SENSITIVE Sensitive     PIP/TAZO <=4 SENSITIVE Sensitive     * ESCHERICHIA COLI     Scheduled Meds: . aspirin EC  81 mg Oral Daily  . atorvastatin  40 mg Oral QHS  . DULoxetine  60 mg Oral Daily  . ferrous sulfate  325 mg Oral TID WC  . furosemide  80 mg Intravenous BID  . gabapentin  600 mg Oral TID  . heparin subcutaneous  5,000 Units Subcutaneous 3 times per day  . insulin aspart  0-15 Units Subcutaneous TID AC & HS  . insulin aspart  6 Units Subcutaneous TID WC  . insulin glargine   55 Units Subcutaneous QHS  . metolazone  5 mg Oral BID  . oxybutynin  10 mg Oral q morning - 10a  . polyethylene glycol  17 g Oral BID  . potassium chloride  20 mEq Oral BID  . rOPINIRole  2 mg Oral QHS  . traZODone  75 mg Oral QHS   Continuous Infusions:   Time spent: 25 minutes  Susa Raring K M.D on 07/18/2014 at 9:29 AM  Between 7am to 7pm - Pager - [phone], After 7pm go to [email or web address] - password [email or web address]  And look for the night coverage person covering me after hours  [facility]  Office  [phone]  07/18/2014, 9:29 AM  LOS: 6 days

## 2014-07-18 NOTE — Procedures (Signed)
Central Venous Catheter Insertion Procedure Note Krista KalataMary O Wise [id number] 14-Sep-1945  Procedure: Insertion of Central Venous Catheter Indications: Assessment of intravascular volume  Procedure Details Consent: Risks of procedure as well as the alternatives and risks of each were explained to the (patient/caregiver).  Consent for procedure obtained. Time Out: Verified patient identification, verified procedure, site/side was marked, verified correct patient position, special equipment/implants available, medications/allergies/relevent history reviewed, required imaging and test results available.  Performed  Maximum sterile technique was used including antiseptics, cap, gloves, gown, hand hygiene, mask and sheet. Skin prep: Chlorhexidine; local anesthetic administered A antimicrobial bonded/coated triple lumen catheter was placed in the right internal jugular vein using the Seldinger technique.  Evaluation Blood flow good Complications: No apparent complications Patient did tolerate procedure well. Chest X-ray ordered to verify placement.  CXR: pending.  Arvilla Meresaniel Bensimhon MD 07/18/2014, 3:29 PM

## 2014-07-18 NOTE — Progress Notes (Signed)
1350 report given to Chat,RN 2H step down unit . Pt awake ,alert and oriented . Pt and spouse aware of transfer as explained by Dr. Gala RomneyBensimhon

## 2014-07-18 NOTE — Progress Notes (Signed)
Advanced Heart Failure Rounding Note   Subjective:    Up and ambulating.  Denies CP/SOB.  Diuresis is sluggish and creatinine continues to trend up. Her wt is up 37 lbs since earlier this month (down 2 lbs since yesterday)    Objective:   Weight Range:  Vital Signs:   Temp:  [97.8 F (36.6 C)] 97.8 F (36.6 C) (12/25 0539) Pulse Rate:  [69-74] 74 (12/25 0539) Resp:  [18-19] 19 (12/25 0539) BP: (115-130)/(47-60) 115/47 mmHg (12/25 0539) SpO2:  [95 %-100 %] 95 % (12/25 0539) Weight:  [250 lb (113.4 kg)] 250 lb (113.4 kg) (12/25 0539) Last BM Date: 07/12/14  Weight change: Filed Weights   07/16/14 0900 07/17/14 0618 07/18/14 0539  Weight: 259 lb 7.7 oz (117.7 kg) 252 lb 6.8 oz (114.5 kg) 250 lb (113.4 kg)    Intake/Output:   Intake/Output Summary (Last 24 hours) at 07/18/14 1253 Last data filed at 07/18/14 1245  Gross per 24 hour  Intake   1420 ml  Output   2250 ml  Net   -830 ml     Physical Exam: General: . Obese woman. Sitting in chair NAD.  Neck: Neck supple, JVP hard to see. But appears 5-6 cm B/L; No masses, thyromegaly or abnormal cervical nodes.  Lungs: Clear bilaterally  Heart: Non-displaced PMI, chest non-tender; regular rate and rhythm, S1, S2 without rubs or gallops. 2/6 early systolic murmur RUSB. Carotid upstroke normal, no bruit. Pedals normal pulses. 1-2+ edema  (UNNA boots intact) Abdomen: Obese soft and non-tender without masses, distended; organomegaly, or hernias noted. No hepatosplenomegaly.  Extremities: No clubbing or cyanosis.  Neurologic: Alert and oriented x 3.   Telemetry: NSR 60s  Labs: Basic Metabolic Panel:  Recent Labs Lab 07/14/14 0416 07/15/14 0410 07/16/14 1110 07/17/14 0405 07/18/14 0348  NA 137 135 135 137 137  K 4.5 4.4 4.4 4.1 4.4  CL 96 98 97 94* 94*  CO2 26 28 29  32 32  GLUCOSE 245* 179* 314* 171* 188*  BUN 32* 30* 29* 30* 36*  CREATININE 2.10* 1.81* 1.72* 1.89* 2.30*  CALCIUM 9.0 8.5 8.9 9.3 9.5  MG 1.7  --    --   --   --     Liver Function Tests:  Recent Labs Lab 07/13/14 0454  AST 12  ALT 9  ALKPHOS 64  BILITOT 0.3  PROT 6.4  ALBUMIN 2.8*   No results for input(s): LIPASE, AMYLASE in the last 168 hours. No results for input(s): AMMONIA in the last 168 hours.  CBC:  Recent Labs Lab 07/12/14 2000 07/13/14 0454 07/14/14 0416 07/15/14 0410  WBC 8.9 7.6 7.0 8.8  NEUTROABS  --  5.8  --   --   HGB 9.9* 9.2* 8.8* 9.2*  HCT 31.5* 30.1* 29.0* 30.2*  MCV 83.3 84.8 84.5 83.2  PLT 195 193 187 181    Cardiac Enzymes:  Recent Labs Lab 07/13/14 1000 07/13/14 1457 07/13/14 2120  TROPONINI 0.49* 0.46* <0.30    BNP: BNP (last 3 results)  Recent Labs  06/03/14 1504 06/24/14 1640 07/12/14 2000  PROBNP 250.0* 3974.0* 3324.0*     Other results:    Imaging: No results found.   Medications:     Scheduled Medications: . aspirin EC  81 mg Oral Daily  . atorvastatin  40 mg Oral QHS  . DULoxetine  60 mg Oral Daily  . ferrous sulfate  325 mg Oral TID WC  . furosemide  80 mg Intravenous BID  . gabapentin  600 mg Oral BID  . heparin subcutaneous  5,000 Units Subcutaneous 3 times per day  . insulin aspart  0-15 Units Subcutaneous TID AC & HS  . insulin aspart  6 Units Subcutaneous TID WC  . insulin glargine  60 Units Subcutaneous QHS  . [START ON 07/19/2014] metolazone  5 mg Oral BID  . oxybutynin  10 mg Oral q morning - 10a  . polyethylene glycol  17 g Oral BID  . potassium chloride  20 mEq Oral BID  . rOPINIRole  2 mg Oral QHS  . traZODone  75 mg Oral QHS    Infusions:    PRN Medications: acetaminophen, albuterol   Assessment:   1. A/C diastolic HF 2. A/c respiratory failure 3. CAD with previous LCX stents 4. Mildly elevated troponin 5. Morbid obesity 6. Severe OSA 7. Mild AS 8. AKI on CKD stage III-IV  Plan/Discussion:    Diuretics increased over the last several days.  However, renal fxn starting to trend up and weight really not decreasing.   At this point, favor either CL or PICC to measure CVP to determine actual fluid status.  After discussion with her and her family, will proceed with CL and CVP access to further determine intravascular status.    Length of Stay: 6 Gildardo CrankerHess, Bryan DO  07/18/2014, 12:53 PM  Advanced Heart Failure Team Pager [phone] (M-F; 7a - 4p)  Please contact [facility] for night-coverage after hours (4p -7a ) and weekends on [email or web address]  Patient seen and examined with Gildardo CrankerBryan Hess. We discussed all aspects of the encounter. I agree with the assessment and plan as stated above.   Difficult situation. Her weight is up over 35 pounds in one month and she has LE edema but CVP does not appear elevated and renal function getting worse at attempts to diurese. We discussed backing off diuretics and watchful waiting versus invasive determination of volume status and cardiac output to look for cardio-renal syndrome. They have chosen the latter. We will move her to 2H for RIJ central line to measure CVP and mixed venous saturation.   Nicholes Mangoan Bensimhon, MD 1:01 PM 07/18/2014

## 2014-07-18 NOTE — Progress Notes (Signed)
   Addendum:  Central line placed. CVP = 7  Co-ox 70%  Volume status is fine. Weight gain related to obesity not HF.  Stop all diuretics. Can go home in am if renal function improving. Counseled on need for weight loss.   Daniel Bensimhon,MD 3:59 PM

## 2014-07-18 NOTE — Plan of Care (Signed)
Problem: Phase I Progression Outcomes Goal: EF % per last Echo/documented,Core Reminder form on chart Outcome: Completed/Met Date Met:  07/18/14 EF = 55-60% per ECHO performed on 06/11/14.

## 2014-07-18 NOTE — Progress Notes (Signed)
Transferred in from 3 east by bed awake alert and oriented.

## 2014-07-19 LAB — CULTURE, BLOOD (ROUTINE X 2)
Culture: NO GROWTH
Culture: NO GROWTH

## 2014-07-19 LAB — BASIC METABOLIC PANEL
Anion gap: 10 (ref 5–15)
BUN: 36 mg/dL — AB (ref 6–23)
CHLORIDE: 94 meq/L — AB (ref 96–112)
CO2: 33 mmol/L — ABNORMAL HIGH (ref 19–32)
CREATININE: 1.94 mg/dL — AB (ref 0.50–1.10)
Calcium: 9.1 mg/dL (ref 8.4–10.5)
GFR calc Af Amer: 30 mL/min — ABNORMAL LOW (ref >90)
GFR calc non Af Amer: 26 mL/min — ABNORMAL LOW (ref >90)
Glucose, Bld: 257 mg/dL — ABNORMAL HIGH (ref 70–99)
Potassium: 4.5 mmol/L (ref 3.5–5.1)
Sodium: 137 mmol/L (ref 135–145)

## 2014-07-19 LAB — GLUCOSE, CAPILLARY: Glucose-Capillary: 251 mg/dL — ABNORMAL HIGH (ref 70–99)

## 2014-07-19 LAB — CARBOXYHEMOGLOBIN
CARBOXYHEMOGLOBIN: 1.5 % (ref 0.5–1.5)
Methemoglobin: 1 % (ref 0.0–1.5)
O2 Saturation: 73.6 %
Total hemoglobin: 8.9 g/dL — ABNORMAL LOW (ref 12.0–16.0)

## 2014-07-19 MED ORDER — TORSEMIDE 20 MG PO TABS: 60.0000 mg | ORAL_TABLET | Freq: Every day | ORAL | Status: DC

## 2014-07-19 MED ORDER — FREESTYLE SYSTEM KIT: 1.0000 | PACK | Freq: Three times a day (TID) | Status: DC

## 2014-07-19 MED ORDER — INSULIN GLARGINE 100 UNIT/ML ~~LOC~~ SOLN: 55.0000 [IU] | Freq: Every day | SUBCUTANEOUS | Status: DC

## 2014-07-19 NOTE — Discharge Instructions (Signed)
Follow with Primary MD [name],[name], NP in 7 days    Get CBC, CMP, 2 view Chest X ray checked  by Primary MD next visit.    Activity: As tolerated with Full fall precautions use walker/cane & assistance as needed   Disposition Home     Diet: Heart Healthy Low carb -  Check your Weight same time everyday, if you gain over 2 pounds, or you develop in leg swelling, experience more shortness of breath or chest pain, call your Primary MD immediately. Follow Cardiac Low Salt Diet and 1.5 lit/day fluid restriction.   On your next visit with your primary care physician please Get Medicines reviewed and adjusted.   Please request your Prim.MD to go over all Hospital Tests and Procedure/Radiological results at the follow up, please get all Hospital records sent to your Prim MD by signing hospital release before you go home.   If you experience worsening of your admission symptoms, develop shortness of breath, life threatening emergency, suicidal or homicidal thoughts you must seek medical attention immediately by calling 911 or calling your MD immediately  if symptoms less severe.  You Must read complete instructions/literature along with all the possible adverse reactions/side effects for all the Medicines you take and that have been prescribed to you. Take any new Medicines after you have completely understood and accpet all the possible adverse reactions/side effects.   Do not drive, operating heavy machinery, perform activities at heights, swimming or participation in water activities or provide baby sitting services if your were admitted for syncope or siezures until you have seen by Primary MD or a Neurologist and advised to do so again.  Do not drive when taking Pain medications.    Do not take more than prescribed Pain, Sleep and Anxiety Medications  Special Instructions: If you have smoked or chewed Tobacco  in the last 2 yrs please stop smoking, stop any regular Alcohol  and or any  Recreational drug use.  Wear Seat belts while driving.   Please note  You were cared for by a hospitalist during your hospital stay. If you have any questions about your discharge medications or the care you received while you were in the hospital after you are discharged, you can call the unit and asked to speak with the hospitalist on call if the hospitalist that took care of you is not available. Once you are discharged, your primary care physician will handle any further medical issues. Please note that NO REFILLS for any discharge medications will be authorized once you are discharged, as it is imperative that you return to your primary care physician (or establish a relationship with a primary care physician if you do not have one) for your aftercare needs so that they can reassess your need for medications and monitor your lab values.

## 2014-07-19 NOTE — Progress Notes (Deleted)
SATURATION QUALIFICATIONS: (This note is used to comply with regulatory documentation for home oxygen)  Patient Saturations on Room Air at Rest =90%  Patient Saturations on Room Air while Ambulating = 94%  Patient Saturations on 2 Liters of oxygen while Ambulating = 98%  Please briefly explain why patient needs home oxygen:

## 2014-07-19 NOTE — Progress Notes (Addendum)
Advanced Heart Failure Rounding Note   Subjective:    Up and ambulating.  Denies CP/SOB.  CVL placed yesterday, CVP 5-7 with co-ox 74%.  Creatinine down to 1.94 (diuretics stopped).   Objective:   Weight Range:  Vital Signs:   Temp:  [97.9 F (36.6 C)-98.6 F (37 C)] 98.4 F (36.9 C) (12/26 0300) Pulse Rate:  [71-88] 71 (12/26 0600) Resp:  [13-24] 16 (12/26 0600) BP: (94-147)/(35-62) 117/38 mmHg (12/26 0600) SpO2:  [90 %-100 %] 96 % (12/26 0600) Weight:  [247 lb 12.8 oz (112.4 kg)] 247 lb 12.8 oz (112.4 kg) (12/26 0300) Last BM Date: 07/12/14  Weight change: Filed Weights   07/17/14 0618 07/18/14 0539 07/19/14 0300  Weight: 252 lb 6.8 oz (114.5 kg) 250 lb (113.4 kg) 247 lb 12.8 oz (112.4 kg)    Intake/Output:   Intake/Output Summary (Last 24 hours) at 07/19/14 0754 Last data filed at 07/19/14 0400  Gross per 24 hour  Intake    750 ml  Output   1550 ml  Net   -800 ml     Physical Exam: General: . Obese woman. Sitting in chair NAD.  Neck: Neck supple, JVP hard to see. But appears 5-6 cm B/L; No masses, thyromegaly or abnormal cervical nodes.  Lungs: Clear bilaterally  Heart: Non-displaced PMI, chest non-tender; regular rate and rhythm, S1, S2 without rubs or gallops. 2/6 early systolic murmur RUSB. Carotid upstroke normal, no bruit. Pedals normal pulses. 1-2+ edema (legs wrapped) Abdomen: Obese soft and non-tender without masses, distended; organomegaly, or hernias noted. No hepatosplenomegaly.  Extremities: No clubbing or cyanosis.  Neurologic: Alert and oriented x 3.   Telemetry: NSR 60s  Labs: Basic Metabolic Panel:  Recent Labs Lab 07/14/14 0416 07/15/14 0410 07/16/14 1110 07/17/14 0405 07/18/14 0348 07/19/14 0440  NA 137 135 135 137 137 137  K 4.5 4.4 4.4 4.1 4.4 4.5  CL 96 98 97 94* 94* 94*  CO2 26 28 29  32 32 33*  GLUCOSE 245* 179* 314* 171* 188* 257*  BUN 32* 30* 29* 30* 36* 36*  CREATININE 2.10* 1.81* 1.72* 1.89* 2.30* 1.94*  CALCIUM 9.0  8.5 8.9 9.3 9.5 9.1  MG 1.7  --   --   --   --   --     Liver Function Tests:  Recent Labs Lab 07/13/14 0454  AST 12  ALT 9  ALKPHOS 64  BILITOT 0.3  PROT 6.4  ALBUMIN 2.8*   No results for input(s): LIPASE, AMYLASE in the last 168 hours. No results for input(s): AMMONIA in the last 168 hours.  CBC:  Recent Labs Lab 07/12/14 2000 07/13/14 0454 07/14/14 0416 07/15/14 0410  WBC 8.9 7.6 7.0 8.8  NEUTROABS  --  5.8  --   --   HGB 9.9* 9.2* 8.8* 9.2*  HCT 31.5* 30.1* 29.0* 30.2*  MCV 83.3 84.8 84.5 83.2  PLT 195 193 187 181    Cardiac Enzymes:  Recent Labs Lab 07/13/14 1000 07/13/14 1457 07/13/14 2120  TROPONINI 0.49* 0.46* <0.30    BNP: BNP (last 3 results)  Recent Labs  06/03/14 1504 06/24/14 1640 07/12/14 2000  PROBNP 250.0* 3974.0* 3324.0*     Other results:    Imaging: Dg Chest Port 1 View  07/18/2014   CLINICAL DATA:  Central line placement, hypertension, diabetes, coronary artery disease, CHF, obesity, GERD  EXAM: PORTABLE CHEST - 1 VIEW  COMPARISON:  Portable exam 1533 hr compared to 07/12/2014  FINDINGS: RIGHT jugular central venous catheter with  tip projecting over cavoatrial junction.  Enlargement of cardiac silhouette.  No acute infiltrate, pleural effusion or pneumothorax.  Minimal RIGHT basilar atelectasis.  IMPRESSION: No pneumothorax following central line insertion.  Enlargement of cardiac silhouette with minimal RIGHT basilar atelectasis.   Electronically Signed   By: Ulyses SouthwardMark  Boles M.D.   On: 07/18/2014 15:51     Medications:     Scheduled Medications: . aspirin EC  81 mg Oral Daily  . atorvastatin  40 mg Oral QHS  . DULoxetine  60 mg Oral Daily  . ferrous sulfate  325 mg Oral TID WC  . gabapentin  600 mg Oral BID  . heparin subcutaneous  5,000 Units Subcutaneous 3 times per day  . insulin aspart  0-15 Units Subcutaneous TID AC & HS  . insulin aspart  6 Units Subcutaneous TID WC  . insulin glargine  60 Units Subcutaneous QHS   . oxybutynin  10 mg Oral q morning - 10a  . polyethylene glycol  17 g Oral BID  . rOPINIRole  2 mg Oral QHS  . traZODone  75 mg Oral QHS    Infusions:    PRN Medications: acetaminophen, albuterol, oxyCODONE-acetaminophen   Assessment:   1. A/C diastolic HF 2. A/c respiratory failure 3. CAD with previous LCX stents 4. Mildly elevated troponin 5. Morbid obesity 6. Severe OSA 7. Mild AS 8. 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 She had CVL placed yesterday: CVP 5-7, co-ox 74%.  Volume status is good at this point.  Diuretics stopped yesterday, creatinine better today (near baseline).  I think that she can go home today.   Would not restart lisinopril with AKI on CKD and hold amlodipine with soft BP.  She will go home on atorvastatin 40, ASA 81, torsemide 60 mg daily to start tomorrow.  No metolazone or ibuprofen.  Non-cardiac meds as prior to admission.  Needs CHF clinic followup 1 week.  Would arrange for home oxygen at night.   Marca AnconaDalton Gracia Saggese  7:54 AM 07/19/2014

## 2014-07-19 NOTE — Discharge Summary (Addendum)
TORINA EY, is a 68 y.o. female  DOB 12-29-1945  MRN [id number].  Admission date:  07/12/2014  Admitting Physician  Berle Mull, MD  Discharge Date:  07/19/2014   Primary MD  Delia Chimes, NP  Recommendations for primary care physician for things to follow:   Monitor BMP and diuretic dose closely. Monitor glycemic control.  Must follow with CHF clinic and primary cardiologist within a week   Admission Diagnosis  Chest pain [R07.9]   Discharge Diagnosis  Chest pain [R07.9]    Principal Problem:   Acute respiratory failure with hypoxemia Active Problems:   Essential hypertension   OSA (obstructive sleep apnea)   Pulmonary HTN   Diabetes mellitus type 2 with complications   Restless leg syndrome   Anemia   AKI (acute kidney injury)   Chronic diastolic CHF (congestive heart failure)   CKD (chronic kidney disease) stage 3, GFR 30-59 ml/min   CAP (community acquired pneumonia)   Elevated troponin   Acute on chronic diastolic heart failure      Past Medical History  Diagnosis Date  . Hypertension   . Depression   . Diabetes mellitus   . GERD (gastroesophageal reflux disease)   . Obesity   . Coronary artery disease 08/2006    Unstable angina. PCI to distal codominant CFX with 2.5 x 20 Taxus DES...  . CHF (congestive heart failure)   . Sleep apnea     stopped cpap  . Arthritis   . Headache(784.0)   . Hx of echocardiogram     Echo (11/15):  Mild LVH, EF 55-60%, no RWMA, Gr 1 DD, mild AI, mild MR, mod LAE.     Past Surgical History  Procedure Laterality Date  . Coronary stent placement  08/2006    CAD; stent placed @ [facility]  . Total abdominal hysterectomy    . Cardiac catheterization      x2            mcclean  . Lumbar fusion  02/02/2013  . Back surgery    . Right heart catheterization N/A  06/18/2014    Procedure: RIGHT HEART CATH;  Surgeon: Larey Dresser, MD;  Location: [facility] CATH LAB;  Service: Cardiovascular;  Laterality: N/A;       History of present illness and  Hospital Course:     Kindly see H&P for history of present illness and admission details, please review complete Labs, Consult reports and Test reports for all details in brief  HPI  from the history and physical done on the day of admission  JORGINA BINNING is a 68 y.o. female with Past medical history of coronary artery disease, chronic pulmonary hypertension with chronic diastolic dysfunction recently underwent right heart catheterization, sleep apnea not using CPAP or oxygen, diabetes mellitus type 2, GERD, morbid obesity, hypertension. Patient presents with complaints of cough shortness of breath and fever. History was obtained from patient's husband was at bedside. Husband mentions that patient was with her at the holiday party and after dinner  she suddenly started having complaints of shortness of breath this was associated with some chest pain and cough. She had fever as well as shaking chills. She did not have any similar event before this. She has been having shortness of breath which is progressively worsening over last few month and has seen her cardiologist and has undergone right heart catheterization for further evaluation and was recommended to use nocturnal oxygen after obtaining oximetric. She was on Lasix and Zaroxolyn but due to increase in serum creatinine this was changed to torsemide with which her serum creatinine had improved and VOLUME status as well. He mentions she has been taking all her medications consistently and there is no other change in her medications at present.     Hospital Course    Acute respiratory failure with hypoxia - due to CAP and acute on chronic diastolic CHF. Off antibiotics, good response to diuresis. Now close to baseline.    Acute on chronic diastolic heart  failure Ef 55% Cardiology monitoring, responded well to diuresis, her baseline weight is likely close to 247 pounds, previous mention of 218 pounds is likely an error, she underwent CVP monitoring through central line placement and a CBP was 7. Her case was discussed today with heart failure physician Dr. Benjamine Mola who has adjusted her medications as below for discharge. She will be following with cardiology on a close basis. She will get home oxygen upon discharge. [name] currently symptom-free and eager to go home.   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 Zaroxolyn also stopped by cardiology.    DM 2- Poor control, increased Lantus to 55 along with sliding scale and Glucophage commenced per home regimen as renal function now back to baseline. Request PCP to monitor CBGs closely. A1c was 8.1.    Possible CAP & UTI - finished antibiotic treatment.    Elevated troponin likely demand in the setting of CAP and CHF, appreciate cardiology assistance, for further cardiac workup. On aspirin and statin. Beta blocker deferred to cardiology in the outpatient setting.    Anemia - she is iron deficient, no evidence of bleeding, due to chronic illness and CKD likely. Oral iron increased.    RLS - continue requip   Neuropathy - continue Lyrica   Weakness - improved after PT ambulating without assistance.        Discharge Condition: stable   Follow UP  Follow-up Information    Follow up with [facility].   Why:  Registered Nurse and Physical Therapy Services to start within 24-48 hours of hospital discharge   Contact information:   [address] [place] [address] [phone]       Follow up with Delia Chimes, NP. Schedule an appointment as soon as  possible for a visit in 1 week.   Specialty:  Nurse Practitioner   Contact information:   [place] [address] [phone]       Follow up with [facility].   Specialty:  Cardiology   Why:  Office will call you for your followup appointment. Call office if you have not heard back in 3 days.   Contact information:   [address] [id number] [place] [phone]        Discharge Instructions  and  Discharge Medications         Discharge Instructions    Discharge instructions    Complete by:  As directed   Follow with Primary MD Delia Chimes, NP in 7 days    Get CBC, CMP, 2 view Chest X ray checked  by Primary MD next visit.    Activity: As tolerated with Full fall precautions use walker/cane & assistance as needed   Disposition Home     Diet: Heart Healthy Low carb -  Check your Weight same time everyday, if you gain over 2 pounds, or you develop in leg swelling, experience more shortness of breath or chest pain, call your Primary MD immediately. Follow Cardiac Low Salt Diet and 1.5 lit/day fluid restriction.   On your next visit with your primary care physician please Get Medicines reviewed and adjusted.   Please request your Prim.MD to go over all Hospital Tests and Procedure/Radiological results at the follow up, please get all Hospital records sent to your Prim MD by signing hospital release before you go home.   If you experience worsening of your admission symptoms, develop shortness of breath, life threatening emergency, suicidal or homicidal thoughts you must seek medical attention immediately by calling 911 or calling your MD immediately  if symptoms less severe.  You Must read complete instructions/literature along with all the possible adverse reactions/side effects for all the Medicines you take and that have been prescribed to you. Take any new  Medicines after you have completely understood and accpet all the possible adverse reactions/side effects.   Do not drive, operating heavy machinery, perform activities at heights, swimming or participation in water activities or provide baby sitting services if your were admitted for syncope or siezures until you have seen by Primary MD or a Neurologist and advised to do so again.  Do not drive when taking Pain medications.    Do not take more than prescribed Pain, Sleep and Anxiety Medications  Special Instructions: If you have smoked or chewed Tobacco  in the last 2 yrs please stop smoking, stop any regular Alcohol  and or any Recreational drug use.  Wear Seat belts while driving.   Please note  You were cared for by a hospitalist during your hospital stay. If you have any questions about your discharge medications or the care you received while you were in the hospital after you are discharged, you can call the unit and asked to speak with the hospitalist on call if the hospitalist that took care of you is not available. Once you are discharged, your primary care physician will handle any further medical issues. Please note that NO REFILLS for any discharge medications will be authorized once you are discharged, as it is imperative that you return to your primary care physician (or establish a relationship with a primary care physician if you do not have one) for your aftercare needs so that they can reassess your need for medications and monitor your lab values.     Increase activity slowly    Complete by:  As directed             Medication List    STOP taking these medications        amLODipine 2.5 MG tablet  Commonly known as:  NORVASC     ibuprofen 200 MG tablet  Commonly known as:  ADVIL,MOTRIN     lisinopril 20 MG tablet  Commonly known as:  PRINIVIL,ZESTRIL     metolazone 2.5 MG  tablet  Commonly known as:  ZAROXOLYN     pregabalin 75 MG capsule  Commonly known as:   LYRICA      TAKE these medications        albuterol 108 (90 BASE) MCG/ACT inhaler  Commonly known as:  PROVENTIL HFA;VENTOLIN HFA  Inhale 1 puff into the lungs every 6 (six) hours as needed for wheezing or shortness of breath.     aspirin EC 81 MG tablet  Take 1 tablet (81 mg total) by mouth daily.     atorvastatin 40 MG tablet  Commonly known as:  LIPITOR  Take 40 mg by mouth at bedtime.     DULoxetine 60 MG capsule  Commonly known as:  CYMBALTA  Take 60 mg by mouth daily.     ferrous sulfate 325 (65 FE) MG tablet  Take 1 tablet (325 mg total) by mouth 2 (two) times daily.     gabapentin 400 MG capsule  Commonly known as:  NEURONTIN  Take 400 mg by mouth 2 (two) times daily.     glucose monitoring kit monitoring kit  1 each by Does not apply route 4 (four) times daily - after meals and at bedtime. 1 month Diabetic Testing Supplies for QAC-QHS accuchecks.Any brand OK     insulin aspart 100 UNIT/ML injection  Commonly known as:  novoLOG  Inject 5 Units into the skin 3 (three) times daily before meals. For cbg >=150     insulin glargine 100 UNIT/ML injection  Commonly known as:  LANTUS  Inject 0.55 mLs (55 Units total) into the skin at bedtime.     metFORMIN 1000 MG tablet  Commonly known as:  GLUCOPHAGE  Take 1,000 mg by mouth 2 (two) times daily with a meal.     multivitamin with minerals Tabs tablet  Take 1 tablet by mouth daily.     oxybutynin 10 MG 24 hr tablet  Commonly known as:  DITROPAN-XL  Take 10 mg by mouth every morning.     pantoprazole 40 MG tablet  Commonly known as:  PROTONIX  Take 40 mg by mouth daily.     rOPINIRole 2 MG tablet  Commonly known as:  REQUIP  Take 2 mg by mouth at bedtime.     torsemide 20 MG tablet  Commonly known as:  DEMADEX  Take 3 tablets (60 mg total) by mouth daily.     traZODone 50 MG tablet  Commonly known as:  DESYREL  Take 75 mg by mouth at bedtime. For sleep.     Vitamin D3 1000 UNITS Caps  Take 1,000 Units  by mouth daily.          Diet and Activity recommendation: See Discharge Instructions above   Consults obtained - cardiology, renal   Major procedures and Radiology Reports - PLEASE review detailed and final reports for all details, in brief -    Central line placement for CVP monitoring CVP was 7.  Dg Chest 2 View  07/12/2014   CLINICAL DATA:  Short of breath, chest tightness, fever, cough  EXAM: CHEST  2 VIEW  COMPARISON:  Radiograph 06/03/2014  FINDINGS: Patient rotated. Stable cardiac silhouette. There is central venous congestion similar prior. Prominence the pulmonary arteries. Mild interstitial edema pattern The lung bases are poorly evaluated. No pleural fluid on lateral projection.  IMPRESSION: Cardiomegaly and interstitial edema. No convincing evidence pneumonia.   Electronically Signed   By: Suzy Bouchard M.D.   On: 07/12/2014 21:41   Dg Chest  Port 1 View  07/18/2014   CLINICAL DATA:  Central line placement, hypertension, diabetes, coronary artery disease, CHF, obesity, GERD  EXAM: PORTABLE CHEST - 1 VIEW  COMPARISON:  Portable exam 1533 hr compared to 07/12/2014  FINDINGS: RIGHT jugular central venous catheter with tip projecting over cavoatrial junction.  Enlargement of cardiac silhouette.  No acute infiltrate, pleural effusion or pneumothorax.  Minimal RIGHT basilar atelectasis.  IMPRESSION: No pneumothorax following central line insertion.  Enlargement of cardiac silhouette with minimal RIGHT basilar atelectasis.   Electronically Signed   By: Lavonia Dana M.D.   On: 07/18/2014 15:51    Micro Results      Recent Results (from the past 240 hour(s))  Blood culture (routine x 2)     Status: None   Collection Time: 07/12/14  8:00 PM  Result Value Ref Range Status   Specimen Description BLOOD LEFT ARM  Final   Special Requests BOTTLES DRAWN AEROBIC AND ANAEROBIC 10CC EACH  Final   Culture  Setup Time   Final    07/13/2014 04:14 Performed at [facility]     Culture   Final    NO GROWTH 5 DAYS Note: Culture results may be compromised due to an excessive volume of blood received in culture bottles. Performed at [facility]    Report Status 07/19/2014 FINAL  Final  Blood culture (routine x 2)     Status: None   Collection Time: 07/12/14  8:05 PM  Result Value Ref Range Status   Specimen Description BLOOD RIGHT ARM  Final   Special Requests BOTTLES DRAWN AEROBIC ONLY 10CC  Final   Culture  Setup Time   Final    07/13/2014 04:14 Performed at [place]   Final    NO GROWTH 5 DAYS Performed at [facility]    Report Status 07/19/2014 FINAL  Final  Urine culture     Status: None   Collection Time: 07/12/14 11:19 PM  Result Value Ref Range Status   Specimen Description URINE, CATHETERIZED  Final   Special Requests NONE  Final   Culture  Setup Time   Final    07/13/2014 05:45 Performed at [place]   Final    >=100,000 COLONIES/ML Performed at [facility]    Culture   Final    ESCHERICHIA COLI Performed at [facility]    Report Status 07/15/2014 FINAL  Final   Organism ID, Bacteria ESCHERICHIA COLI  Final      Susceptibility   Escherichia coli - MIC*    AMPICILLIN <=2 SENSITIVE Sensitive     CEFAZOLIN <=4 SENSITIVE Sensitive     CEFTRIAXONE <=1 SENSITIVE Sensitive     CIPROFLOXACIN <=0.25 SENSITIVE Sensitive     GENTAMICIN <=1 SENSITIVE Sensitive     LEVOFLOXACIN <=0.12 SENSITIVE Sensitive     NITROFURANTOIN <=16 SENSITIVE Sensitive     TOBRAMYCIN <=1 SENSITIVE Sensitive     TRIMETH/SULFA <=20 SENSITIVE Sensitive     PIP/TAZO <=4 SENSITIVE Sensitive     * ESCHERICHIA COLI  MRSA PCR Screening     Status: None   Collection Time: 07/18/14  2:26 PM  Result Value Ref Range Status   MRSA by PCR NEGATIVE NEGATIVE Final    Comment:        The GeneXpert MRSA Assay (FDA approved for NASAL specimens only), is one component of a comprehensive MRSA  colonization surveillance program. It is not  intended to diagnose MRSA infection nor to guide or monitor treatment for MRSA infections.        Today   Subjective:   Olean Sangster today has no headache,no chest abdominal pain,no new weakness tingling or numbness, feels much better wants to go home today.    Objective:   Blood pressure 105/50, pulse 66, temperature 97.7 F (36.5 C), temperature source Oral, resp. rate 16, height '5\' 6"'  (1.676 m), weight 112.4 kg (247 lb 12.8 oz), SpO2 90 %.   Intake/Output Summary (Last 24 hours) at 07/19/14 1332 Last data filed at 07/19/14 1000  Gross per 24 hour  Intake    700 ml  Output   1300 ml  Net   -600 ml    Exam Awake Alert, Oriented x 3, No new F.N deficits, Normal affect [place].AT,PERRAL Supple Neck,No JVD, No cervical lymphadenopathy appriciated.  Symmetrical Chest wall movement, Good air movement bilaterally, CTAB RRR,No Gallops,Rubs or new Murmurs, No Parasternal Heave +ve B.Sounds, Abd Soft, Non tender, No organomegaly appriciated, No rebound -guarding or rigidity. No Cyanosis, Clubbing or edema, No new Rash or bruise  Data Review   CBC w Diff:  Lab Results  Component Value Date   WBC 8.8 07/15/2014   HGB 9.2* 07/15/2014   HCT 30.2* 07/15/2014   PLT 181 07/15/2014   LYMPHOPCT 11* 07/13/2014   MONOPCT 10 07/13/2014   EOSPCT 2 07/13/2014   BASOPCT 0 07/13/2014    CMP:  Lab Results  Component Value Date   NA 137 07/19/2014   K 4.5 07/19/2014   CL 94* 07/19/2014   CO2 33* 07/19/2014   BUN 36* 07/19/2014   CREATININE 1.94* 07/19/2014   CREATININE 2.38* 06/13/2014   PROT 6.4 07/13/2014   ALBUMIN 2.8* 07/13/2014   BILITOT 0.3 07/13/2014   ALKPHOS 64 07/13/2014   AST 12 07/13/2014   ALT 9 07/13/2014  . Lab Results  Component Value Date   HGBA1C 8.1* 07/07/2013      Total Time in preparing paper work, data evaluation and todays exam - 35 minutes  Thurnell Lose M.D on 07/19/2014 at 1:32 PM  [facility] Office  [phone]

## 2014-07-19 NOTE — Progress Notes (Signed)
I have sent a message to the CHF clinic nurse and scheduler requesting a 1 week follow-up appointment, and our office will call the patient with this information. Gionni Vaca PA-C

## 2014-07-19 NOTE — Progress Notes (Signed)
SATURATION QUALIFICATIONS: (This note is used to comply with regulatory documentation for home oxygen)  Patient Saturations on Room Air at Rest = 88 %  Patient Saturations on Room Air while Ambulating = 87%  Patient Saturations on 2 Liters of oxygen while Ambulating = 94%  Please briefly explain why patient needs home oxygen: 

## 2014-07-21 ENCOUNTER — Other Ambulatory Visit (HOSPITAL_COMMUNITY): Payer: Self-pay | Admitting: Cardiology

## 2014-07-22 ENCOUNTER — Telehealth (HOSPITAL_COMMUNITY): Payer: Self-pay | Admitting: Vascular Surgery

## 2014-07-22 NOTE — Telephone Encounter (Signed)
PT sent home with Unna boots.. [facility] states there are no order for the boots, and when should the pt take them off.. Please advise

## 2014-07-23 NOTE — Telephone Encounter (Signed)
Per Dr. Shirlee LatchMcLean, [facility] continue bilateral Una boots with standard protocol per policy.  Ave FilterBradley, Megan Genevea

## 2014-08-01 ENCOUNTER — Encounter (HOSPITAL_COMMUNITY): Payer: Self-pay

## 2014-08-01 ENCOUNTER — Ambulatory Visit (HOSPITAL_COMMUNITY)
Admission: RE | Admit: 2014-08-01 | Discharge: 2014-08-01 | Disposition: A | Discharge status: Home / Self Care | Payer: Medicare Other | Source: Ambulatory Visit | Attending: Internal Medicine | Admitting: Internal Medicine

## 2014-08-01 VITALS — BP 122/55 | HR 90 | Resp 18 | Wt 247.0 lb

## 2014-08-01 DIAGNOSIS — N183 Chronic kidney disease, stage 3 unspecified: Secondary | ICD-10-CM

## 2014-08-01 DIAGNOSIS — I5032 Chronic diastolic (congestive) heart failure: Secondary | ICD-10-CM | POA: Insufficient documentation

## 2014-08-01 DIAGNOSIS — G4733 Obstructive sleep apnea (adult) (pediatric): Secondary | ICD-10-CM

## 2014-08-01 DIAGNOSIS — N189 Chronic kidney disease, unspecified: Secondary | ICD-10-CM | POA: Diagnosis not present

## 2014-08-01 DIAGNOSIS — I2510 Atherosclerotic heart disease of native coronary artery without angina pectoris: Secondary | ICD-10-CM | POA: Insufficient documentation

## 2014-08-01 DIAGNOSIS — R0602 Shortness of breath: Secondary | ICD-10-CM

## 2014-08-01 DIAGNOSIS — I10 Essential (primary) hypertension: Secondary | ICD-10-CM | POA: Diagnosis not present

## 2014-08-01 LAB — BASIC METABOLIC PANEL
Anion gap: 17 — ABNORMAL HIGH (ref 5–15)
BUN: 40 mg/dL — ABNORMAL HIGH (ref 6–23)
CO2: 24 mmol/L (ref 19–32)
Calcium: 9.3 mg/dL (ref 8.4–10.5)
Chloride: 99 mEq/L (ref 96–112)
Creatinine, Ser: 2.06 mg/dL — ABNORMAL HIGH (ref 0.50–1.10)
GFR calc non Af Amer: 24 mL/min — ABNORMAL LOW (ref >90)
GFR, EST AFRICAN AMERICAN: 28 mL/min — AB (ref >90)
GLUCOSE: 122 mg/dL — AB (ref 70–99)
Potassium: 4.3 mmol/L (ref 3.5–5.1)
Sodium: 140 mmol/L (ref 135–145)

## 2014-08-01 LAB — BRAIN NATRIURETIC PEPTIDE: B Natriuretic Peptide: 421.4 pg/mL — ABNORMAL HIGH (ref 0.0–100.0)

## 2014-08-01 NOTE — Progress Notes (Signed)
Patient ID: Krista Wise, female   DOB: Nov 20, 1945, 69 y.o.   MRN: [id number] PCP: Delia Chimes  69 yo with history of CAD, DM, HTN, OHS/OSA, and diastolic CHF presents for followup. She had unstable angina in 2/08 with a stent placed in her distal CFX. She had a myoview in 2012 that was ischemic by both ECG and perfusion images. Echo showed preserved EF (55-60%), but there was moderate pulmonary hypertension suggesting likely diastolic LV dysfunction.  Had cath in 4/12 where left heart cath showed 80-90% mid and distal circumflex stenoses before and after the prior-placed stent and a totally occluded moderate-sized OM1.  She received 2 drug eluting stents in the CFX.   In 2/13, she was admitted with hypercarbic respiratory failure requiring bipap.  She was thought to have acute bronchitis with reactive airways disease and decompensation of her baseline poor respiratory status.  She also had acute on chronic diastolic CHF.  Her beta blocker was changed to bisoprolol because of wheezing.  Mildly elevated troponin was thought to be due to demand ischemia.  She lost 11 lbs in the hospital with diuresis.  She wears oxygen with exertion but is not using CPAP (it is not comfortable for her).  Lexiscan myoview done after this hospitalization showed EF 68%, moderate partially reversible lateral defect that actually looked better than the prior myoview.    Last echo in 11/15 showed EF 55-60% with mild LVH.  She developed increased dyspnea for several months.  She saw Richardson Dopp on 06/03/14 and was noted to have increased weight and lower extremity edema.  At baseline, she walks with a walker and uses a wheelchair outside the house (since 7/14 back surgery).  She sleeps in a chair because of back surgery.  No PND.  Poor energy.  [name] short of breath walking in her house at this point.  When she saw PA Kathlen Mody, she was started on metolazone 2.5 mg in addition to Lasix 80 mg bid, but she did not diurese well.   Followup BMET showed that creatinine had increased from 1.6 to 3.  She held her diuretics and I took her for RHC on 11/15, showing mean RA 8, PA 53/24, and mean PCWP 19.  She was started on torsemide 60 mg daily.   Admitted to [facility] 12/19 through 07/19/14 with acute dyspnea. Diuresed with IV lasix and transitioned back to torsemide 60 mg daily. Overall diuresed 10 pounds. Also discharged on nocturnal oxygen. She was discharged off Ace and ibuprofen due to worsening renal function.   She returns for follow up. Overall feeing good. Mild dyspnea with lots of walking. Weight at home 247 pounds. She has had an extra 20 mg torsemide . Taking all medications. Using oxygen at night. Trying to follow low salt diet and limit fluid intake. Taking 600 mg ibuprofen daily. Ambulates with walker.       Labs (3/12): BNP 417 => 309, K 4.4, creatinine 0.7, HCT 44, free T3/T4 normal, TSH normal, HDL 38, LDL 96.5  Labs (5/12): BNP 422 => 160, K 3.9, creatinine 0.9, HDL 33, LDL 84 Labs (6/12): LDL 50, HDL 34, TGs 197 Labs (7/12): K 4.1, creatinine 1.1 Labs (11/12): K 3.9, creatinine 1.4, BNP 91 Labs (2/13): K 3.6, creatinine 0.92 Labs (3/13): K 5.5 => 4, creatinine 2.2 => 0.9, HDL 33, TGs 335, LDL 54, BNP 76 Labs (7/13): K 4, creatinine 1.2 Labs (11/15): K 3.8 => 4, creatinine 1.6 => 2.4 => 3.0 => 1.6, pBNP 250,  LDL 51, HDL 26 Labs (07/20/15): K 4.5 Creatinine 1.94   Allergies (verified):  No Known Drug Allergies   Family History:  Father: Deceased; MI in his 35s, CABG, CHF  Mother: Alive; HTN, rhematoid arthritis, anemic   Social History:  Married, lives with husband in [place]  disabled: diabetes & fibromyagia  Tobacco Use - No.  Alcohol Use - no  Regular Exercise - no   ROS: All systems reviewed and negative except as per HPI.   Past Medical History:  1. Hypertension  2. Depression  3. DM  4. Obesity  5. GERD  6. CAD: Unstable angina 2/08. PCI to distal codominant CFX with 2.5 x 20 Taxus DES.  Lexiscan myoview (3/12): chest pain and dyspnea with stress, EF 62%, small fixed lateral perfusion defect with significant surrounding reversible perfusion defect suggestive of significant ischemia, ischemic ECG response.  LHC (4/12): 80-90% mid and distal CFX stenosis before and after prior-placed stent, total occlusion of a moderate 1st OM.  Patient had DES x 2 to CFX.   Lexiscan myoview (3/13) with EF 68%, moderate partially reversible lateral defect c/w prior MI/peri-infarct ischemia.  Defect less marked than on the prior study.  7. Fibromyalgia  8. Diastolic CHF: Echo (7/82) with EF 55-60%, mild LV hypertrophy, mild aortic stenosis (mean gradient 13 mmHg), mild MR, normal RV size and systolic function, PA systolic pressure 55 mmHg.  Echo (2/13): EF 60%, mild LVH, moderate diastolic dysfunction, mild AI, aortic sclerosis, mild MR, mild RV dilation and dysfunction, PASP 61 mmHg.  Echo (11/15) with EF 55-60%, mild LVH, mild AI, mild MR.  RHC (11/15) with mean RA 8, PA 53/24 mean 36, mean PCWP 19, CI 4.23, PVR 1.9 WU.  9. Aortic stenosis: Minimal by most recent echo.  10.  Diabetic retinopathy 11.  H/o vertigo 12.  Peripheral neuropathy likely from diabetes.  13. OHS/OSA: Severe OSA on sleep study.  Patient is off CPAP currently b/c she could not tolerate the mask.  14. Back surgery in 7/14.  15. CKD 16. RHC 06/18/14 RA mean 8 RV 49/10 PA 53/24, mean 36 PCWP mean 19 Cardiac Output (Fick) 9.02  Cardiac Index (Fick) 4.23 PVR 1.9 WU    Current Outpatient Prescriptions  Medication Sig Dispense Refill  . aspirin EC 81 MG tablet Take 1 tablet (81 mg total) by mouth daily.    Marland Kitchen atorvastatin (LIPITOR) 40 MG tablet Take 40 mg by mouth at bedtime.     . Cholecalciferol (VITAMIN D3) 1000 UNITS CAPS Take 1,000 Units by mouth daily.    . DULoxetine (CYMBALTA) 60 MG capsule Take 60 mg by mouth 2 (two) times daily.     . ferrous sulfate 325 (65 FE) MG tablet Take 1 tablet (325 mg total) by mouth 2 (two)  times daily. (Patient taking differently: Take 325 mg by mouth daily with breakfast. ) 60 tablet 3  . gabapentin (NEURONTIN) 400 MG capsule Take 400 mg by mouth 2 (two) times daily.    Marland Kitchen glucose monitoring kit (FREESTYLE) monitoring kit 1 each by Does not apply route 4 (four) times daily - after meals and at bedtime. 1 month Diabetic Testing Supplies for QAC-QHS accuchecks.Any brand OK 1 each 1  . ibuprofen (ADVIL,MOTRIN) 200 MG tablet Take 600 mg by mouth every morning.    . insulin aspart (NOVOLOG) 100 UNIT/ML injection Inject 5 Units into the skin 3 (three) times daily before meals. For cbg >=150 3 vial PRN  . insulin glargine (LANTUS) 100 UNIT/ML injection  Inject 0.55 mLs (55 Units total) into the skin at bedtime. 10 mL 11  . metFORMIN (GLUCOPHAGE) 1000 MG tablet Take 1,000 mg by mouth 2 (two) times daily with a meal.    . Multiple Vitamin (MULTIVITAMIN WITH MINERALS) TABS Take 1 tablet by mouth daily.    Marland Kitchen oxybutynin (DITROPAN-XL) 10 MG 24 hr tablet Take 10 mg by mouth every morning.    . pantoprazole (PROTONIX) 40 MG tablet Take 40 mg by mouth daily.    Marland Kitchen rOPINIRole (REQUIP) 2 MG tablet Take 2 mg by mouth at bedtime.     . torsemide (DEMADEX) 20 MG tablet TAKE THREE TABLETS BY MOUTH IN THE MORNING 90 tablet 0  . traZODone (DESYREL) 50 MG tablet Take 75 mg by mouth at bedtime. For sleep.    . [DISCONTINUED] simvastatin (ZOCOR) 40 MG tablet Take 40 mg by mouth every evening.     No current facility-administered medications for this encounter.    BP 122/55 mmHg  Pulse 90  Resp 18  Wt 247 lb (112.038 kg)  SpO2 92% General: Well developed, well nourished, in no acute distress. Obese.  Neck: Neck supple, 5-6 cm. No masses, thyromegaly or abnormal cervical nodes.  Lungs: Clear bilaterally  Heart: Non-displaced PMI, chest non-tender; regular rate and rhythm, S1, S2 without rubs or gallops. 2/6 early systolic murmur RUSB. Carotid upstroke normal, no bruit. Pedals normal pulses. trace edema to  knees Abdomen: Obese, Bowel sounds positive; abdomen soft and non-tender without masses, organomegaly, or hernias noted. No hepatosplenomegaly.  Extremities: No clubbing or cyanosis.  Neurologic: Alert and oriented x 3.   Assessment/Plan: 1. CAD: No chest pain.  Continue ASA 81, statin. Likely restart bisoprolol again at next visit.  2. Chronic diastolic CHF: NYHA III. Doing well post hospital. Volume status stable. Continue torsemide 60 mg daily. Instructed to stop ibuprophen. Reinforced daily weights, low salt diet, and limiting fluids to < 2 liters per day.   - BMET/BNP today.  -3. Hyperlipidemia: Good lipids recently.  4. HTN: BP controlled. 5. OHS/OSA: Unable to tolerate CPAP. Using nocturnal oxygen.   6. CKD: BMET today. Off Ace due to CKD.  Will need to refer nephrology if renal function worse.   Follow up in  6 weeks.    CLEGG,AMY NP-C  08/01/2014

## 2014-08-01 NOTE — Patient Instructions (Signed)
Avoid Ibuprofen, Tylenol is recommended instead.  Follow up 6 weeks.  Do the following things EVERYDAY: 1) Weigh yourself in the morning before breakfast. Write it down and keep it in a log. 2) Take your medicines as prescribed 3) Eat low salt foods-Limit salt (sodium) to 2000 mg per day.  4) Stay as active as you can everyday 5) Limit all fluids for the day to less than 2 liters

## 2014-08-22 ENCOUNTER — Ambulatory Visit: Payer: Medicare Other | Admitting: Nurse Practitioner

## 2014-08-27 ENCOUNTER — Telehealth (HOSPITAL_COMMUNITY): Payer: Self-pay | Admitting: Cardiology

## 2014-08-27 NOTE — Telephone Encounter (Signed)
Amy,Rn with [facility] Call is not related to HF, however patient has exhausted other resources thus far Pt does have congestion. Reports not feeling well for a few days. Lungs sound "wet" SOB o2 sat 69-80's sitting on room air Cough with yellowish/green mucus  Pt has not established with pulmonary and PCP is out of office for serious medical issue and "on-call" provider is not set up  Amy,RM strongly feels this is respirtory in nature and pt could benefit from CXR and further work up Advised to have pt to report to nearest urgent care for further evaluation of symptoms

## 2014-09-01 ENCOUNTER — Other Ambulatory Visit (HOSPITAL_COMMUNITY): Payer: Self-pay | Admitting: Cardiology

## 2014-09-01 MED ORDER — TORSEMIDE 20 MG PO TABS: ORAL_TABLET | ORAL | Status: DC

## 2014-09-11 ENCOUNTER — Ambulatory Visit (HOSPITAL_COMMUNITY)
Admission: RE | Admit: 2014-09-11 | Discharge: 2014-09-11 | Disposition: A | Discharge status: Home / Self Care | Payer: Medicare Other | Source: Ambulatory Visit | Attending: Cardiology | Admitting: Cardiology

## 2014-09-11 ENCOUNTER — Encounter (HOSPITAL_COMMUNITY): Payer: Self-pay

## 2014-09-11 VITALS — BP 140/71 | HR 80 | Resp 20 | Wt 247.5 lb

## 2014-09-11 DIAGNOSIS — E1151 Type 2 diabetes mellitus with diabetic peripheral angiopathy without gangrene: Secondary | ICD-10-CM | POA: Insufficient documentation

## 2014-09-11 DIAGNOSIS — N183 Chronic kidney disease, stage 3 unspecified: Secondary | ICD-10-CM

## 2014-09-11 DIAGNOSIS — I10 Essential (primary) hypertension: Secondary | ICD-10-CM | POA: Insufficient documentation

## 2014-09-11 DIAGNOSIS — Z79899 Other long term (current) drug therapy: Secondary | ICD-10-CM | POA: Diagnosis not present

## 2014-09-11 DIAGNOSIS — M797 Fibromyalgia: Secondary | ICD-10-CM | POA: Diagnosis not present

## 2014-09-11 DIAGNOSIS — K219 Gastro-esophageal reflux disease without esophagitis: Secondary | ICD-10-CM | POA: Insufficient documentation

## 2014-09-11 DIAGNOSIS — I252 Old myocardial infarction: Secondary | ICD-10-CM | POA: Insufficient documentation

## 2014-09-11 DIAGNOSIS — N189 Chronic kidney disease, unspecified: Secondary | ICD-10-CM | POA: Insufficient documentation

## 2014-09-11 DIAGNOSIS — Z794 Long term (current) use of insulin: Secondary | ICD-10-CM | POA: Insufficient documentation

## 2014-09-11 DIAGNOSIS — E785 Hyperlipidemia, unspecified: Secondary | ICD-10-CM | POA: Insufficient documentation

## 2014-09-11 DIAGNOSIS — F329 Major depressive disorder, single episode, unspecified: Secondary | ICD-10-CM | POA: Insufficient documentation

## 2014-09-11 DIAGNOSIS — Z7982 Long term (current) use of aspirin: Secondary | ICD-10-CM | POA: Insufficient documentation

## 2014-09-11 DIAGNOSIS — I129 Hypertensive chronic kidney disease with stage 1 through stage 4 chronic kidney disease, or unspecified chronic kidney disease: Secondary | ICD-10-CM | POA: Insufficient documentation

## 2014-09-11 DIAGNOSIS — I159 Secondary hypertension, unspecified: Secondary | ICD-10-CM

## 2014-09-11 DIAGNOSIS — G4733 Obstructive sleep apnea (adult) (pediatric): Secondary | ICD-10-CM

## 2014-09-11 DIAGNOSIS — G629 Polyneuropathy, unspecified: Secondary | ICD-10-CM | POA: Diagnosis not present

## 2014-09-11 DIAGNOSIS — E11319 Type 2 diabetes mellitus with unspecified diabetic retinopathy without macular edema: Secondary | ICD-10-CM | POA: Diagnosis not present

## 2014-09-11 DIAGNOSIS — I5032 Chronic diastolic (congestive) heart failure: Secondary | ICD-10-CM

## 2014-09-11 DIAGNOSIS — I25110 Atherosclerotic heart disease of native coronary artery with unstable angina pectoris: Secondary | ICD-10-CM | POA: Diagnosis not present

## 2014-09-11 MED ORDER — METOLAZONE 2.5 MG PO TABS: 2.5000 mg | ORAL_TABLET | Freq: Every day | ORAL | Status: DC | PRN

## 2014-09-11 MED ORDER — BISOPROLOL FUMARATE 5 MG PO TABS: 2.5000 mg | ORAL_TABLET | Freq: Every day | ORAL | Status: DC

## 2014-09-11 NOTE — Progress Notes (Signed)
Patient ID: Krista Wise, female   DOB: Sep 11, 1945, 69 y.o.   MRN: [id number] PCP: Delia Chimes  69 yo with history of CAD, DM, HTN, OHS/OSA, and diastolic CHF presents for followup. She had unstable angina in 2/08 with a stent placed in her distal CFX. She had a myoview in 2012 that was ischemic by both ECG and perfusion images. Echo showed preserved EF (55-60%), but there was moderate pulmonary hypertension suggesting likely diastolic LV dysfunction.  Had cath in 4/12 where left heart cath showed 80-90% mid and distal circumflex stenoses before and after the prior-placed stent and a totally occluded moderate-sized OM1.  She received 2 drug eluting stents in the CFX.   In 2/13, she was admitted with hypercarbic respiratory failure requiring bipap.  She was thought to have acute bronchitis with reactive airways disease and decompensation of her baseline poor respiratory status.  She also had acute on chronic diastolic CHF.  Her beta blocker was changed to bisoprolol because of wheezing.  Mildly elevated troponin was thought to be due to demand ischemia.  She lost 11 lbs in the hospital with diuresis.  She wears oxygen with exertion but is not using CPAP (it is not comfortable for her).  Lexiscan myoview done after this hospitalization showed EF 68%, moderate partially reversible lateral defect that actually looked better than the prior myoview.    Last echo in 11/15 showed EF 55-60% with mild LVH.  She developed increased dyspnea for several months.  She saw Richardson Dopp on 06/03/14 and was noted to have increased weight and lower extremity edema.  At baseline, she walks with a walker and uses a wheelchair outside the house (since 7/14 back surgery).  She sleeps in a chair because of back surgery.  No PND.  Poor energy.  [name] short of breath walking in her house at this point.  When she saw PA Kathlen Mody, she was started on metolazone 2.5 mg in addition to Lasix 80 mg bid, but she did not diurese well.   Followup BMET showed that creatinine had increased from 1.6 to 3.  She held her diuretics and I took her for RHC on 11/15, showing mean RA 8, PA 53/24, and mean PCWP 19.  She was started on torsemide 60 mg daily.   Admitted to [facility] 12/19 through 07/19/14 with acute dyspnea. Diuresed with IV lasix and transitioned back to torsemide 60 mg daily. Overall diuresed 10 pounds. Also discharged on nocturnal oxygen. She was discharged off Ace and ibuprofen due to worsening renal function.   She returns for follow up. Overall feeing good. Having some extra swelling in his legs. Having fatigue and ongoing dyspnea weight exertion. Weight at home trending up from 235-246 pounds. Taking all medications. Using oxygen at night. Trying to follow low salt diet and limit fluid intake.  Ambulates with walker.      Labs (3/12): BNP 417 => 309, K 4.4, creatinine 0.7, HCT 44, free T3/T4 normal, TSH normal, HDL 38, LDL 96.5  Labs (5/12): BNP 422 => 160, K 3.9, creatinine 0.9, HDL 33, LDL 84 Labs (6/12): LDL 50, HDL 34, TGs 197 Labs (7/12): K 4.1, creatinine 1.1 Labs (11/12): K 3.9, creatinine 1.4, BNP 91 Labs (2/13): K 3.6, creatinine 0.92 Labs (3/13): K 5.5 => 4, creatinine 2.2 => 0.9, HDL 33, TGs 335, LDL 54, BNP 76 Labs (7/13): K 4, creatinine 1.2 Labs (11/15): K 3.8 => 4, creatinine 1.6 => 2.4 => 3.0 => 1.6, pBNP 250, LDL 51, HDL  26 Labs (07/20/15): K 4.5 Creatinine 1.94  Labs 08/01/2014: K 4.3 Creatinine 2.06 BNP 421   Allergies (verified):  No Known Drug Allergies   Family History:  Father: Deceased; MI in his 63s, CABG, CHF  Mother: Alive; HTN, rhematoid arthritis, anemic   Social History:  Married, lives with husband in [place]  disabled: diabetes & fibromyagia  Tobacco Use - No.  Alcohol Use - no  Regular Exercise - no   ROS: All systems reviewed and negative except as per HPI.   Past Medical History:  1. Hypertension  2. Depression  3. DM  4. Obesity  5. GERD  6. CAD: Unstable angina 2/08.  PCI to distal codominant CFX with 2.5 x 20 Taxus DES. Lexiscan myoview (3/12): chest pain and dyspnea with stress, EF 62%, small fixed lateral perfusion defect with significant surrounding reversible perfusion defect suggestive of significant ischemia, ischemic ECG response.  LHC (4/12): 80-90% mid and distal CFX stenosis before and after prior-placed stent, total occlusion of a moderate 1st OM.  Patient had DES x 2 to CFX.   Lexiscan myoview (3/13) with EF 68%, moderate partially reversible lateral defect c/w prior MI/peri-infarct ischemia.  Defect less marked than on the prior study.  7. Fibromyalgia  8. Diastolic CHF: Echo (9/60) with EF 55-60%, mild LV hypertrophy, mild aortic stenosis (mean gradient 13 mmHg), mild MR, normal RV size and systolic function, PA systolic pressure 55 mmHg.  Echo (2/13): EF 60%, mild LVH, moderate diastolic dysfunction, mild AI, aortic sclerosis, mild MR, mild RV dilation and dysfunction, PASP 61 mmHg.  Echo (11/15) with EF 55-60%, mild LVH, mild AI, mild MR.  RHC (11/15) with mean RA 8, PA 53/24 mean 36, mean PCWP 19, CI 4.23, PVR 1.9 WU.  9. Aortic stenosis: Minimal by most recent echo.  10.  Diabetic retinopathy 11.  H/o vertigo 12.  Peripheral neuropathy likely from diabetes.  13. OHS/OSA: Severe OSA on sleep study.  Patient is off CPAP currently b/c she could not tolerate the mask.  14. Back surgery in 7/14.  15. CKD 16. RHC 06/18/14 RA mean 8 RV 49/10 PA 53/24, mean 36 PCWP mean 19 Cardiac Output (Fick) 9.02  Cardiac Index (Fick) 4.23 PVR 1.9 WU    Current Outpatient Prescriptions  Medication Sig Dispense Refill  . aspirin EC 81 MG tablet Take 1 tablet (81 mg total) by mouth daily.    Marland Kitchen atorvastatin (LIPITOR) 40 MG tablet Take 40 mg by mouth at bedtime.     . Cholecalciferol (VITAMIN D3) 1000 UNITS CAPS Take 1,000 Units by mouth daily.    . DULoxetine (CYMBALTA) 60 MG capsule Take 60 mg by mouth 2 (two) times daily.     . ferrous sulfate 325 (65 FE) MG  tablet Take 1 tablet (325 mg total) by mouth 2 (two) times daily. (Patient taking differently: Take 325 mg by mouth daily with breakfast. ) 60 tablet 3  . gabapentin (NEURONTIN) 400 MG capsule Take 400 mg by mouth 2 (two) times daily.    Marland Kitchen glucose monitoring kit (FREESTYLE) monitoring kit 1 each by Does not apply route 4 (four) times daily - after meals and at bedtime. 1 month Diabetic Testing Supplies for QAC-QHS accuchecks.Any brand OK 1 each 1  . ibuprofen (ADVIL,MOTRIN) 200 MG tablet Take 600 mg by mouth every morning.    . insulin aspart (NOVOLOG) 100 UNIT/ML injection Inject 5 Units into the skin 3 (three) times daily before meals. For cbg >=150 3 vial PRN  .  insulin glargine (LANTUS) 100 UNIT/ML injection Inject 0.55 mLs (55 Units total) into the skin at bedtime. 10 mL 11  . metFORMIN (GLUCOPHAGE) 1000 MG tablet Take 1,000 mg by mouth 2 (two) times daily with a meal.    . Multiple Vitamin (MULTIVITAMIN WITH MINERALS) TABS Take 1 tablet by mouth daily.    Marland Kitchen oxybutynin (DITROPAN-XL) 10 MG 24 hr tablet Take 10 mg by mouth every morning.    . pantoprazole (PROTONIX) 40 MG tablet Take 40 mg by mouth daily.    Marland Kitchen rOPINIRole (REQUIP) 2 MG tablet Take 2 mg by mouth at bedtime.     . torsemide (DEMADEX) 20 MG tablet TAKE THREE TABLETS BY MOUTH IN THE MORNING 90 tablet 6  . traZODone (DESYREL) 50 MG tablet Take 75 mg by mouth at bedtime. For sleep.    . [DISCONTINUED] simvastatin (ZOCOR) 40 MG tablet Take 40 mg by mouth every evening.     No current facility-administered medications for this encounter.    BP 140/71 mmHg  Pulse 80  Resp 20  Wt 247 lb 8 oz (112.265 kg)  SpO2 94% General: Well developed, well nourished, in no acute distress. Obese.  Neck: Neck supple, 9-10 cm. No masses, thyromegaly or abnormal cervical nodes.  Lungs: Clear bilaterally  Heart: Non-displaced PMI, chest non-tender; regular rate and rhythm, S1, S2 without rubs or gallops. 2/6 early systolic murmur RUSB. Carotid  upstroke normal, no bruit. Pedals normal pulses. trace edema to knees Abdomen: Obese, Bowel sounds positive; abdomen soft and non-tender without masses, organomegaly, or hernias noted. No hepatosplenomegaly.  Extremities: No clubbing or cyanosis. Rand LLE 2+ edema.  Neurologic: Alert and oriented x 3.   Assessment/Plan: 1. CAD: No chest pain.  Continue ASA 81, statin. Add 2.5 mg biisoprolol today.   2. Chronic diastolic CHF: NYHA III. Volume status elevated. Continue torsemide 60 mg daily and I have asked her to take 2.5 mg metolazone today.   Reinforced daily weights, low salt diet, and limiting fluids to < 2 liters per day.  3. Hyperlipidemia: Good lipids recently.  4. HTN: BP up a little but as above I am adding 2.5 mg bisoprolol today.  5. OHS/OSA: Unable to tolerate CPAP. Using nocturnal oxygen.   6. CKD:  Will need to refer nephrology if renal function worse. Check BMET next visit.   Follow up in  6 weeks.    CLEGG,AMY NP-C  09/11/2014

## 2014-09-11 NOTE — Patient Instructions (Signed)
START Bisoprolol 2.5mg  (1/2 tablet) once daily.  START Metolazone once daily as needed for weight gain 2 lb or greater over night. Take one TODAY!  We [facility] Address: [address], [place], [place] [address]  [phone]  Follow up 6 weeks.  Do the following things EVERYDAY: 1) Weigh yourself in the morning before breakfast. Write it down and keep it in a log. 2) Take your medicines as prescribed 3) Eat low salt foods-Limit salt (sodium) to 2000 mg per day.  4) Stay as active as you can everyday 5) Limit all fluids for the day to less than 2 liters

## 2014-09-17 ENCOUNTER — Telehealth (HOSPITAL_COMMUNITY): Payer: Self-pay | Admitting: Vascular Surgery

## 2014-09-17 NOTE — Telephone Encounter (Signed)
Spoke w/Amiee, RN gave ok to re cert pt

## 2014-09-17 NOTE — Telephone Encounter (Signed)
Nurse from [facility] called she would like recert orders

## 2014-10-12 ENCOUNTER — Encounter (HOSPITAL_COMMUNITY): Payer: Self-pay | Admitting: *Deleted

## 2014-10-12 ENCOUNTER — Emergency Department (HOSPITAL_COMMUNITY)
Admission: EM | Admit: 2014-10-12 | Discharge: 2014-10-12 | Disposition: A | Discharge status: Other Acute Inpatient Hospital | Payer: Medicare Other | Attending: Emergency Medicine | Admitting: Emergency Medicine

## 2014-10-12 ENCOUNTER — Emergency Department (HOSPITAL_COMMUNITY): Payer: Medicare Other

## 2014-10-12 DIAGNOSIS — Z7982 Long term (current) use of aspirin: Secondary | ICD-10-CM | POA: Insufficient documentation

## 2014-10-12 DIAGNOSIS — E1140 Type 2 diabetes mellitus with diabetic neuropathy, unspecified: Secondary | ICD-10-CM | POA: Diagnosis not present

## 2014-10-12 DIAGNOSIS — Y9389 Activity, other specified: Secondary | ICD-10-CM | POA: Diagnosis not present

## 2014-10-12 DIAGNOSIS — Z9889 Other specified postprocedural states: Secondary | ICD-10-CM | POA: Insufficient documentation

## 2014-10-12 DIAGNOSIS — Z23 Encounter for immunization: Secondary | ICD-10-CM | POA: Insufficient documentation

## 2014-10-12 DIAGNOSIS — Z794 Long term (current) use of insulin: Secondary | ICD-10-CM | POA: Diagnosis not present

## 2014-10-12 DIAGNOSIS — Y9289 Other specified places as the place of occurrence of the external cause: Secondary | ICD-10-CM | POA: Insufficient documentation

## 2014-10-12 DIAGNOSIS — K219 Gastro-esophageal reflux disease without esophagitis: Secondary | ICD-10-CM | POA: Insufficient documentation

## 2014-10-12 DIAGNOSIS — I509 Heart failure, unspecified: Secondary | ICD-10-CM | POA: Diagnosis not present

## 2014-10-12 DIAGNOSIS — X19XXXA Contact with other heat and hot substances, initial encounter: Secondary | ICD-10-CM | POA: Insufficient documentation

## 2014-10-12 DIAGNOSIS — T25222A Burn of second degree of left foot, initial encounter: Secondary | ICD-10-CM | POA: Insufficient documentation

## 2014-10-12 DIAGNOSIS — I2510 Atherosclerotic heart disease of native coronary artery without angina pectoris: Secondary | ICD-10-CM | POA: Insufficient documentation

## 2014-10-12 DIAGNOSIS — M1990 Unspecified osteoarthritis, unspecified site: Secondary | ICD-10-CM | POA: Diagnosis not present

## 2014-10-12 DIAGNOSIS — Z9861 Coronary angioplasty status: Secondary | ICD-10-CM | POA: Insufficient documentation

## 2014-10-12 DIAGNOSIS — I10 Essential (primary) hypertension: Secondary | ICD-10-CM | POA: Diagnosis not present

## 2014-10-12 DIAGNOSIS — F329 Major depressive disorder, single episode, unspecified: Secondary | ICD-10-CM | POA: Diagnosis not present

## 2014-10-12 DIAGNOSIS — Z79899 Other long term (current) drug therapy: Secondary | ICD-10-CM | POA: Diagnosis not present

## 2014-10-12 DIAGNOSIS — Y998 Other external cause status: Secondary | ICD-10-CM | POA: Insufficient documentation

## 2014-10-12 DIAGNOSIS — T25022A Burn of unspecified degree of left foot, initial encounter: Secondary | ICD-10-CM | POA: Diagnosis present

## 2014-10-12 LAB — CBC WITH DIFFERENTIAL/PLATELET
BASOS ABS: 0 10*3/uL (ref 0.0–0.1)
Basophils Relative: 0 % (ref 0–1)
Eosinophils Absolute: 0.2 10*3/uL (ref 0.0–0.7)
Eosinophils Relative: 2 % (ref 0–5)
HCT: 37.2 % (ref 36.0–46.0)
Hemoglobin: 11.3 g/dL — ABNORMAL LOW (ref 12.0–15.0)
LYMPHS PCT: 12 % (ref 12–46)
Lymphs Abs: 1.2 10*3/uL (ref 0.7–4.0)
MCH: 25.3 pg — ABNORMAL LOW (ref 26.0–34.0)
MCHC: 30.4 g/dL (ref 30.0–36.0)
MCV: 83.2 fL (ref 78.0–100.0)
MONO ABS: 0.8 10*3/uL (ref 0.1–1.0)
Monocytes Relative: 8 % (ref 3–12)
Neutro Abs: 7.7 10*3/uL (ref 1.7–7.7)
Neutrophils Relative %: 78 % — ABNORMAL HIGH (ref 43–77)
Platelets: 275 10*3/uL (ref 150–400)
RBC: 4.47 MIL/uL (ref 3.87–5.11)
RDW: 17.5 % — AB (ref 11.5–15.5)
WBC: 9.9 10*3/uL (ref 4.0–10.5)

## 2014-10-12 LAB — COMPREHENSIVE METABOLIC PANEL
ALBUMIN: 3.4 g/dL — AB (ref 3.5–5.2)
ALT: 11 U/L (ref 0–35)
ANION GAP: 14 (ref 5–15)
AST: 13 U/L (ref 0–37)
Alkaline Phosphatase: 73 U/L (ref 39–117)
BUN: 34 mg/dL — ABNORMAL HIGH (ref 6–23)
CALCIUM: 9.5 mg/dL (ref 8.4–10.5)
CHLORIDE: 94 mmol/L — AB (ref 96–112)
CO2: 31 mmol/L (ref 19–32)
Creatinine, Ser: 1.7 mg/dL — ABNORMAL HIGH (ref 0.50–1.10)
GFR calc Af Amer: 35 mL/min — ABNORMAL LOW (ref >90)
GFR calc non Af Amer: 30 mL/min — ABNORMAL LOW (ref >90)
GLUCOSE: 203 mg/dL — AB (ref 70–99)
POTASSIUM: 4.3 mmol/L (ref 3.5–5.1)
Sodium: 139 mmol/L (ref 135–145)
Total Bilirubin: 0.5 mg/dL (ref 0.3–1.2)
Total Protein: 7.2 g/dL (ref 6.0–8.3)

## 2014-10-12 MED ORDER — TETANUS-DIPHTH-ACELL PERTUSSIS 5-2.5-18.5 LF-MCG/0.5 IM SUSP
0.5000 mL | Freq: Once | INTRAMUSCULAR | Status: AC
  Administered 2014-10-12: 0.5 mL via INTRAMUSCULAR
  Filled 2014-10-12: qty 0.5

## 2014-10-12 MED ORDER — PIPERACILLIN-TAZOBACTAM 4.5 G IVPB
4.5000 g | Freq: Once | INTRAVENOUS | Status: AC
  Administered 2014-10-12: 4.5 g via INTRAVENOUS
  Filled 2014-10-12: qty 100

## 2014-10-12 MED ORDER — VANCOMYCIN HCL IN DEXTROSE 1-5 GM/200ML-% IV SOLN
1000.0000 mg | Freq: Once | INTRAVENOUS | Status: AC
  Administered 2014-10-12: 1000 mg via INTRAVENOUS
  Filled 2014-10-12: qty 200
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
## 2014-10-12 NOTE — ED Notes (Signed)
[facility] CALLING CARE LINK FOR TRANSPORT

## 2014-10-12 NOTE — ED Notes (Addendum)
Saline cleaned foot. Patient has blisters to the sole of the foot and great toe. Foot red.
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
## 2014-10-19 ENCOUNTER — Emergency Department (HOSPITAL_COMMUNITY): Payer: Medicare Other

## 2014-10-19 ENCOUNTER — Encounter (HOSPITAL_COMMUNITY): Payer: Self-pay | Admitting: Physical Medicine and Rehabilitation

## 2014-10-19 ENCOUNTER — Inpatient Hospital Stay (HOSPITAL_COMMUNITY): Payer: Medicare Other

## 2014-10-19 ENCOUNTER — Inpatient Hospital Stay (HOSPITAL_COMMUNITY)
Admission: EM | Admit: 2014-10-19 | Discharge: 2014-11-14 | DRG: 871 | Disposition: A | Discharge status: Rehabilitation Facility | Payer: Medicare Other | Attending: Internal Medicine | Admitting: Internal Medicine

## 2014-10-19 DIAGNOSIS — T25022A Burn of unspecified degree of left foot, initial encounter: Secondary | ICD-10-CM | POA: Diagnosis present

## 2014-10-19 DIAGNOSIS — E871 Hypo-osmolality and hyponatremia: Secondary | ICD-10-CM | POA: Diagnosis present

## 2014-10-19 DIAGNOSIS — I5032 Chronic diastolic (congestive) heart failure: Secondary | ICD-10-CM | POA: Diagnosis not present

## 2014-10-19 DIAGNOSIS — Z7982 Long term (current) use of aspirin: Secondary | ICD-10-CM

## 2014-10-19 DIAGNOSIS — J9602 Acute respiratory failure with hypercapnia: Secondary | ICD-10-CM | POA: Diagnosis present

## 2014-10-19 DIAGNOSIS — I959 Hypotension, unspecified: Secondary | ICD-10-CM | POA: Diagnosis not present

## 2014-10-19 DIAGNOSIS — T25322A Burn of third degree of left foot, initial encounter: Secondary | ICD-10-CM | POA: Diagnosis present

## 2014-10-19 DIAGNOSIS — A419 Sepsis, unspecified organism: Secondary | ICD-10-CM | POA: Diagnosis present

## 2014-10-19 DIAGNOSIS — I5031 Acute diastolic (congestive) heart failure: Secondary | ICD-10-CM | POA: Diagnosis not present

## 2014-10-19 DIAGNOSIS — I129 Hypertensive chronic kidney disease with stage 1 through stage 4 chronic kidney disease, or unspecified chronic kidney disease: Secondary | ICD-10-CM | POA: Diagnosis present

## 2014-10-19 DIAGNOSIS — Z981 Arthrodesis status: Secondary | ICD-10-CM

## 2014-10-19 DIAGNOSIS — M19032 Primary osteoarthritis, left wrist: Secondary | ICD-10-CM | POA: Diagnosis present

## 2014-10-19 DIAGNOSIS — R52 Pain, unspecified: Secondary | ICD-10-CM

## 2014-10-19 DIAGNOSIS — T25322D Burn of third degree of left foot, subsequent encounter: Secondary | ICD-10-CM

## 2014-10-19 DIAGNOSIS — J189 Pneumonia, unspecified organism: Secondary | ICD-10-CM | POA: Insufficient documentation

## 2014-10-19 DIAGNOSIS — G9340 Encephalopathy, unspecified: Secondary | ICD-10-CM | POA: Diagnosis not present

## 2014-10-19 DIAGNOSIS — D638 Anemia in other chronic diseases classified elsewhere: Secondary | ICD-10-CM | POA: Diagnosis present

## 2014-10-19 DIAGNOSIS — R188 Other ascites: Secondary | ICD-10-CM | POA: Diagnosis present

## 2014-10-19 DIAGNOSIS — IMO0002 Reserved for concepts with insufficient information to code with codable children: Secondary | ICD-10-CM | POA: Diagnosis present

## 2014-10-19 DIAGNOSIS — J9600 Acute respiratory failure, unspecified whether with hypoxia or hypercapnia: Secondary | ICD-10-CM | POA: Diagnosis present

## 2014-10-19 DIAGNOSIS — E872 Acidosis, unspecified: Secondary | ICD-10-CM

## 2014-10-19 DIAGNOSIS — E1165 Type 2 diabetes mellitus with hyperglycemia: Secondary | ICD-10-CM | POA: Diagnosis present

## 2014-10-19 DIAGNOSIS — J811 Chronic pulmonary edema: Secondary | ICD-10-CM

## 2014-10-19 DIAGNOSIS — R42 Dizziness and giddiness: Secondary | ICD-10-CM | POA: Insufficient documentation

## 2014-10-19 DIAGNOSIS — L0390 Cellulitis, unspecified: Secondary | ICD-10-CM | POA: Diagnosis present

## 2014-10-19 DIAGNOSIS — X16XXXA Contact with hot heating appliances, radiators and pipes, initial encounter: Secondary | ICD-10-CM | POA: Diagnosis present

## 2014-10-19 DIAGNOSIS — T25022D Burn of unspecified degree of left foot, subsequent encounter: Secondary | ICD-10-CM | POA: Diagnosis not present

## 2014-10-19 DIAGNOSIS — I214 Non-ST elevation (NSTEMI) myocardial infarction: Secondary | ICD-10-CM

## 2014-10-19 DIAGNOSIS — R112 Nausea with vomiting, unspecified: Secondary | ICD-10-CM

## 2014-10-19 DIAGNOSIS — T25021A Burn of unspecified degree of right foot, initial encounter: Secondary | ICD-10-CM | POA: Diagnosis not present

## 2014-10-19 DIAGNOSIS — E860 Dehydration: Secondary | ICD-10-CM | POA: Diagnosis present

## 2014-10-19 DIAGNOSIS — F29 Unspecified psychosis not due to a substance or known physiological condition: Secondary | ICD-10-CM | POA: Diagnosis not present

## 2014-10-19 DIAGNOSIS — I951 Orthostatic hypotension: Secondary | ICD-10-CM | POA: Diagnosis not present

## 2014-10-19 DIAGNOSIS — R109 Unspecified abdominal pain: Secondary | ICD-10-CM

## 2014-10-19 DIAGNOSIS — E118 Type 2 diabetes mellitus with unspecified complications: Secondary | ICD-10-CM | POA: Diagnosis present

## 2014-10-19 DIAGNOSIS — I5043 Acute on chronic combined systolic (congestive) and diastolic (congestive) heart failure: Secondary | ICD-10-CM | POA: Diagnosis present

## 2014-10-19 DIAGNOSIS — N183 Chronic kidney disease, stage 3 unspecified: Secondary | ICD-10-CM | POA: Diagnosis present

## 2014-10-19 DIAGNOSIS — E875 Hyperkalemia: Secondary | ICD-10-CM | POA: Diagnosis not present

## 2014-10-19 DIAGNOSIS — G2581 Restless legs syndrome: Secondary | ICD-10-CM | POA: Diagnosis present

## 2014-10-19 DIAGNOSIS — R6521 Severe sepsis with septic shock: Secondary | ICD-10-CM | POA: Diagnosis present

## 2014-10-19 DIAGNOSIS — I5033 Acute on chronic diastolic (congestive) heart failure: Secondary | ICD-10-CM | POA: Diagnosis not present

## 2014-10-19 DIAGNOSIS — I509 Heart failure, unspecified: Secondary | ICD-10-CM

## 2014-10-19 DIAGNOSIS — E1121 Type 2 diabetes mellitus with diabetic nephropathy: Secondary | ICD-10-CM | POA: Diagnosis not present

## 2014-10-19 DIAGNOSIS — N19 Unspecified kidney failure: Secondary | ICD-10-CM

## 2014-10-19 DIAGNOSIS — L03031 Cellulitis of right toe: Secondary | ICD-10-CM | POA: Diagnosis not present

## 2014-10-19 DIAGNOSIS — F329 Major depressive disorder, single episode, unspecified: Secondary | ICD-10-CM | POA: Diagnosis present

## 2014-10-19 DIAGNOSIS — Z87898 Personal history of other specified conditions: Secondary | ICD-10-CM

## 2014-10-19 DIAGNOSIS — E1142 Type 2 diabetes mellitus with diabetic polyneuropathy: Secondary | ICD-10-CM | POA: Diagnosis present

## 2014-10-19 DIAGNOSIS — E662 Morbid (severe) obesity with alveolar hypoventilation: Secondary | ICD-10-CM | POA: Diagnosis present

## 2014-10-19 DIAGNOSIS — Z9981 Dependence on supplemental oxygen: Secondary | ICD-10-CM | POA: Diagnosis not present

## 2014-10-19 DIAGNOSIS — J9601 Acute respiratory failure with hypoxia: Secondary | ICD-10-CM | POA: Diagnosis present

## 2014-10-19 DIAGNOSIS — R1013 Epigastric pain: Secondary | ICD-10-CM | POA: Diagnosis not present

## 2014-10-19 DIAGNOSIS — N170 Acute kidney failure with tubular necrosis: Secondary | ICD-10-CM | POA: Diagnosis not present

## 2014-10-19 DIAGNOSIS — Z955 Presence of coronary angioplasty implant and graft: Secondary | ICD-10-CM | POA: Diagnosis not present

## 2014-10-19 DIAGNOSIS — I4891 Unspecified atrial fibrillation: Secondary | ICD-10-CM | POA: Diagnosis not present

## 2014-10-19 DIAGNOSIS — I10 Essential (primary) hypertension: Secondary | ICD-10-CM | POA: Diagnosis not present

## 2014-10-19 DIAGNOSIS — L03032 Cellulitis of left toe: Secondary | ICD-10-CM | POA: Diagnosis not present

## 2014-10-19 DIAGNOSIS — D509 Iron deficiency anemia, unspecified: Secondary | ICD-10-CM | POA: Diagnosis present

## 2014-10-19 DIAGNOSIS — Z6841 Body Mass Index (BMI) 40.0 and over, adult: Secondary | ICD-10-CM

## 2014-10-19 DIAGNOSIS — R0600 Dyspnea, unspecified: Secondary | ICD-10-CM | POA: Diagnosis not present

## 2014-10-19 DIAGNOSIS — Z794 Long term (current) use of insulin: Secondary | ICD-10-CM | POA: Diagnosis not present

## 2014-10-19 DIAGNOSIS — K219 Gastro-esophageal reflux disease without esophagitis: Secondary | ICD-10-CM | POA: Diagnosis present

## 2014-10-19 DIAGNOSIS — E785 Hyperlipidemia, unspecified: Secondary | ICD-10-CM | POA: Diagnosis present

## 2014-10-19 DIAGNOSIS — R410 Disorientation, unspecified: Secondary | ICD-10-CM | POA: Diagnosis not present

## 2014-10-19 DIAGNOSIS — N178 Other acute kidney failure: Secondary | ICD-10-CM | POA: Diagnosis not present

## 2014-10-19 DIAGNOSIS — R040 Epistaxis: Secondary | ICD-10-CM | POA: Diagnosis not present

## 2014-10-19 DIAGNOSIS — I2510 Atherosclerotic heart disease of native coronary artery without angina pectoris: Secondary | ICD-10-CM | POA: Diagnosis present

## 2014-10-19 DIAGNOSIS — Z9289 Personal history of other medical treatment: Secondary | ICD-10-CM

## 2014-10-19 DIAGNOSIS — E440 Moderate protein-calorie malnutrition: Secondary | ICD-10-CM | POA: Diagnosis not present

## 2014-10-19 DIAGNOSIS — G7281 Critical illness myopathy: Secondary | ICD-10-CM | POA: Diagnosis present

## 2014-10-19 DIAGNOSIS — D649 Anemia, unspecified: Secondary | ICD-10-CM | POA: Diagnosis present

## 2014-10-19 DIAGNOSIS — J9690 Respiratory failure, unspecified, unspecified whether with hypoxia or hypercapnia: Secondary | ICD-10-CM

## 2014-10-19 DIAGNOSIS — N179 Acute kidney failure, unspecified: Secondary | ICD-10-CM | POA: Diagnosis not present

## 2014-10-19 DIAGNOSIS — I5042 Chronic combined systolic (congestive) and diastolic (congestive) heart failure: Secondary | ICD-10-CM | POA: Insufficient documentation

## 2014-10-19 DIAGNOSIS — Z419 Encounter for procedure for purposes other than remedying health state, unspecified: Secondary | ICD-10-CM

## 2014-10-19 DIAGNOSIS — R0602 Shortness of breath: Secondary | ICD-10-CM

## 2014-10-19 DIAGNOSIS — R197 Diarrhea, unspecified: Secondary | ICD-10-CM

## 2014-10-19 DIAGNOSIS — N186 End stage renal disease: Secondary | ICD-10-CM | POA: Diagnosis not present

## 2014-10-19 LAB — CBC WITH DIFFERENTIAL/PLATELET
Basophils Absolute: 0 10*3/uL (ref 0.0–0.1)
Basophils Relative: 0 % (ref 0–1)
EOS ABS: 0 10*3/uL (ref 0.0–0.7)
Eosinophils Relative: 0 % (ref 0–5)
HEMATOCRIT: 34.1 % — AB (ref 36.0–46.0)
Hemoglobin: 10.2 g/dL — ABNORMAL LOW (ref 12.0–15.0)
LYMPHS ABS: 1 10*3/uL (ref 0.7–4.0)
LYMPHS PCT: 11 % — AB (ref 12–46)
MCH: 24.8 pg — AB (ref 26.0–34.0)
MCHC: 29.9 g/dL — ABNORMAL LOW (ref 30.0–36.0)
MCV: 82.8 fL (ref 78.0–100.0)
Monocytes Absolute: 0.3 10*3/uL (ref 0.1–1.0)
Monocytes Relative: 3 % (ref 3–12)
Neutro Abs: 7.1 10*3/uL (ref 1.7–7.7)
Neutrophils Relative %: 86 % — ABNORMAL HIGH (ref 43–77)
Platelets: 324 10*3/uL (ref 150–400)
RBC: 4.12 MIL/uL (ref 3.87–5.11)
RDW: 17.2 % — ABNORMAL HIGH (ref 11.5–15.5)
WBC: 8.3 10*3/uL (ref 4.0–10.5)

## 2014-10-19 LAB — I-STAT ARTERIAL BLOOD GAS, ED
Acid-base deficit: 21 mmol/L — ABNORMAL HIGH (ref 0.0–2.0)
BICARBONATE: 10.3 meq/L — AB (ref 20.0–24.0)
O2 Saturation: 96 %
TCO2: 12 mmol/L (ref 0–100)
pCO2 arterial: 46.2 mmHg — ABNORMAL HIGH (ref 35.0–45.0)
pH, Arterial: 6.954 — CL (ref 7.350–7.450)
pO2, Arterial: 130 mmHg — ABNORMAL HIGH (ref 80.0–100.0)

## 2014-10-19 LAB — POCT I-STAT 3, ART BLOOD GAS (G3+)
ACID-BASE DEFICIT: 21 mmol/L — AB (ref 0.0–2.0)
Acid-base deficit: 15 mmol/L — ABNORMAL HIGH (ref 0.0–2.0)
Bicarbonate: 10.5 mEq/L — ABNORMAL LOW (ref 20.0–24.0)
Bicarbonate: 12.8 mEq/L — ABNORMAL LOW (ref 20.0–24.0)
O2 Saturation: 100 %
O2 Saturation: 96 %
PCO2 ART: 36.5 mmHg (ref 35.0–45.0)
PO2 ART: 101 mmHg — AB (ref 80.0–100.0)
Patient temperature: 98.7
TCO2: 12 mmol/L (ref 0–100)
TCO2: 14 mmol/L (ref 0–100)
pCO2 arterial: 43.1 mmHg (ref 35.0–45.0)
pH, Arterial: 6.982 — CL (ref 7.350–7.450)
pH, Arterial: 7.153 — CL (ref 7.350–7.450)
pO2, Arterial: 388 mmHg — ABNORMAL HIGH (ref 80.0–100.0)

## 2014-10-19 LAB — BASIC METABOLIC PANEL
Anion gap: 29 — ABNORMAL HIGH (ref 5–15)
BUN: 82 mg/dL — ABNORMAL HIGH (ref 6–23)
CALCIUM: 6.8 mg/dL — AB (ref 8.4–10.5)
CO2: 15 mmol/L — ABNORMAL LOW (ref 19–32)
Chloride: 93 mmol/L — ABNORMAL LOW (ref 96–112)
Creatinine, Ser: 8.26 mg/dL — ABNORMAL HIGH (ref 0.50–1.10)
GFR calc non Af Amer: 4 mL/min — ABNORMAL LOW (ref >90)
GFR, EST AFRICAN AMERICAN: 5 mL/min — AB (ref >90)
Glucose, Bld: 162 mg/dL — ABNORMAL HIGH (ref 70–99)
Potassium: 5 mmol/L (ref 3.5–5.1)
Sodium: 137 mmol/L (ref 135–145)

## 2014-10-19 LAB — PHOSPHORUS: PHOSPHORUS: 11.9 mg/dL — AB (ref 2.3–4.6)

## 2014-10-19 LAB — COMPREHENSIVE METABOLIC PANEL
ALK PHOS: 47 U/L (ref 39–117)
ALT: 13 U/L (ref 0–35)
AST: 26 U/L (ref 0–37)
Albumin: 2.8 g/dL — ABNORMAL LOW (ref 3.5–5.2)
Anion gap: 34 — ABNORMAL HIGH (ref 5–15)
BILIRUBIN TOTAL: 0.8 mg/dL (ref 0.3–1.2)
BUN: 96 mg/dL — AB (ref 6–23)
CALCIUM: 8.4 mg/dL (ref 8.4–10.5)
CHLORIDE: 92 mmol/L — AB (ref 96–112)
CO2: 9 mmol/L — AB (ref 19–32)
Creatinine, Ser: 10.15 mg/dL — ABNORMAL HIGH (ref 0.50–1.10)
GFR calc Af Amer: 4 mL/min — ABNORMAL LOW (ref >90)
GFR calc non Af Amer: 3 mL/min — ABNORMAL LOW (ref >90)
GLUCOSE: 87 mg/dL (ref 70–99)
POTASSIUM: 7.3 mmol/L — AB (ref 3.5–5.1)
Sodium: 135 mmol/L (ref 135–145)
TOTAL PROTEIN: 6.7 g/dL (ref 6.0–8.3)

## 2014-10-19 LAB — GLUCOSE, CAPILLARY: Glucose-Capillary: 183 mg/dL — ABNORMAL HIGH (ref 70–99)

## 2014-10-19 LAB — RENAL FUNCTION PANEL
Albumin: 2.3 g/dL — ABNORMAL LOW (ref 3.5–5.2)
Anion gap: 27 — ABNORMAL HIGH (ref 5–15)
BUN: 89 mg/dL — ABNORMAL HIGH (ref 6–23)
CHLORIDE: 95 mmol/L — AB (ref 96–112)
CO2: 12 mmol/L — AB (ref 19–32)
Calcium: 7.4 mg/dL — ABNORMAL LOW (ref 8.4–10.5)
Creatinine, Ser: 9.02 mg/dL — ABNORMAL HIGH (ref 0.50–1.10)
GFR, EST AFRICAN AMERICAN: 5 mL/min — AB (ref >90)
GFR, EST NON AFRICAN AMERICAN: 4 mL/min — AB (ref >90)
Glucose, Bld: 176 mg/dL — ABNORMAL HIGH (ref 70–99)
Phosphorus: 12.1 mg/dL — ABNORMAL HIGH (ref 2.3–4.6)
Potassium: 6.2 mmol/L (ref 3.5–5.1)
Sodium: 134 mmol/L — ABNORMAL LOW (ref 135–145)

## 2014-10-19 LAB — CARBOXYHEMOGLOBIN
Carboxyhemoglobin: 0.6 % (ref 0.5–1.5)
Methemoglobin: 1 % (ref 0.0–1.5)
O2 SAT: 21.6 %
TOTAL HEMOGLOBIN: 10.7 g/dL — AB (ref 12.0–16.0)

## 2014-10-19 LAB — I-STAT CHEM 8, ED
BUN: 101 mg/dL — ABNORMAL HIGH (ref 6–23)
CHLORIDE: 96 mmol/L (ref 96–112)
CREATININE: 10.1 mg/dL — AB (ref 0.50–1.10)
Calcium, Ion: 0.96 mmol/L — ABNORMAL LOW (ref 1.13–1.30)
Glucose, Bld: 84 mg/dL (ref 70–99)
HCT: 34 % — ABNORMAL LOW (ref 36.0–46.0)
Hemoglobin: 11.6 g/dL — ABNORMAL LOW (ref 12.0–15.0)
Potassium: 6.9 mmol/L (ref 3.5–5.1)
Sodium: 129 mmol/L — ABNORMAL LOW (ref 135–145)
TCO2: 11 mmol/L (ref 0–100)

## 2014-10-19 LAB — MAGNESIUM: Magnesium: 1.6 mg/dL (ref 1.5–2.5)

## 2014-10-19 LAB — I-STAT TROPONIN, ED: Troponin i, poc: 0.14 ng/mL (ref 0.00–0.08)

## 2014-10-19 LAB — PROCALCITONIN: Procalcitonin: 0.23 ng/mL

## 2014-10-19 LAB — LACTIC ACID, PLASMA: LACTIC ACID, VENOUS: 8 mmol/L — AB (ref 0.5–2.0)

## 2014-10-19 LAB — CULTURE, BLOOD (ROUTINE X 2)
Culture: NO GROWTH
Culture: NO GROWTH

## 2014-10-19 LAB — MRSA PCR SCREENING: MRSA by PCR: NEGATIVE

## 2014-10-19 LAB — I-STAT CG4 LACTIC ACID, ED: Lactic Acid, Venous: 10.06 mmol/L (ref 0.5–2.0)

## 2014-10-19 MED ORDER — ALBUTEROL SULFATE (2.5 MG/3ML) 0.083% IN NEBU
5.0000 mg | INHALATION_SOLUTION | Freq: Once | RESPIRATORY_TRACT | Status: AC
  Administered 2014-10-19: 5 mg via RESPIRATORY_TRACT

## 2014-10-19 MED ORDER — HEPARIN SODIUM (PORCINE) 5000 UNIT/ML IJ SOLN
5000.0000 [IU] | Freq: Three times a day (TID) | INTRAMUSCULAR | Status: DC
  Administered 2014-10-19 – 2014-10-23 (×7): 5000 [IU] via SUBCUTANEOUS
  Filled 2014-10-19 (×15): qty 1

## 2014-10-19 MED ORDER — PIPERACILLIN-TAZOBACTAM 3.375 G IVPB 30 MIN
3.3750 g | Freq: Once | INTRAVENOUS | Status: AC
  Administered 2014-10-19: 3.375 g via INTRAVENOUS
  Filled 2014-10-19: qty 50

## 2014-10-19 MED ORDER — SODIUM BICARBONATE 8.4 % IV SOLN
50.0000 meq | Freq: Once | INTRAVENOUS | Status: DC
  Filled 2014-10-19: qty 50

## 2014-10-19 MED ORDER — SODIUM CHLORIDE 0.9 % IV SOLN: 250.0000 mL | INTRAVENOUS | Status: DC | PRN

## 2014-10-19 MED ORDER — DEXTROSE 50 % IV SOLN
50.0000 mL | Freq: Once | INTRAVENOUS | Status: AC
  Administered 2014-10-19: 50 mL via INTRAVENOUS
  Filled 2014-10-19: qty 50

## 2014-10-19 MED ORDER — CETYLPYRIDINIUM CHLORIDE 0.05 % MT LIQD
7.0000 mL | Freq: Four times a day (QID) | OROMUCOSAL | Status: DC
  Administered 2014-10-20 – 2014-10-27 (×28): 7 mL via OROMUCOSAL

## 2014-10-19 MED ORDER — MIDAZOLAM HCL 2 MG/2ML IJ SOLN
INTRAMUSCULAR | Status: AC
  Filled 2014-10-19: qty 4

## 2014-10-19 MED ORDER — ALBUTEROL SULFATE (2.5 MG/3ML) 0.083% IN NEBU
2.5000 mg | INHALATION_SOLUTION | RESPIRATORY_TRACT | Status: DC | PRN
  Administered 2014-10-21: 2.5 mg via RESPIRATORY_TRACT
  Filled 2014-10-19: qty 3

## 2014-10-19 MED ORDER — SODIUM CHLORIDE 0.9 % IV BOLUS (SEPSIS)
1000.0000 mL | Freq: Once | INTRAVENOUS | Status: AC
  Administered 2014-10-19: 1000 mL via INTRAVENOUS

## 2014-10-19 MED ORDER — ALTEPLASE 2 MG IJ SOLR
2.0000 mg | Freq: Once | INTRAMUSCULAR | Status: AC | PRN
  Administered 2014-10-19: 2 mg
  Filled 2014-10-19: qty 2

## 2014-10-19 MED ORDER — VANCOMYCIN HCL 10 G IV SOLR
1500.0000 mg | Freq: Once | INTRAVENOUS | Status: AC
  Administered 2014-10-19: 1500 mg via INTRAVENOUS
  Filled 2014-10-19: qty 1500

## 2014-10-19 MED ORDER — INSULIN ASPART 100 UNIT/ML IV SOLN
6.0000 [IU] | Freq: Once | INTRAVENOUS | Status: AC
  Administered 2014-10-19: 6 [IU] via INTRAVENOUS
  Filled 2014-10-19: qty 1

## 2014-10-19 MED ORDER — ALBUTEROL (5 MG/ML) CONTINUOUS INHALATION SOLN: 5.0000 mg/h | INHALATION_SOLUTION | Freq: Once | RESPIRATORY_TRACT | Status: DC

## 2014-10-19 MED ORDER — LIDOCAINE HCL (CARDIAC) 20 MG/ML IV SOLN
INTRAVENOUS | Status: AC
  Filled 2014-10-19: qty 5

## 2014-10-19 MED ORDER — CHLORHEXIDINE GLUCONATE 0.12 % MT SOLN
15.0000 mL | Freq: Two times a day (BID) | OROMUCOSAL | Status: DC
  Administered 2014-10-19 – 2014-11-14 (×42): 15 mL via OROMUCOSAL
  Filled 2014-10-19 (×54): qty 15

## 2014-10-19 MED ORDER — HEPARIN SODIUM (PORCINE) 1000 UNIT/ML DIALYSIS
1000.0000 [IU] | INTRAMUSCULAR | Status: DC | PRN
  Administered 2014-10-19: 1200 [IU] via INTRAVENOUS_CENTRAL
  Administered 2014-10-20: 1000 [IU] via INTRAVENOUS_CENTRAL
  Administered 2014-10-26: 3000 [IU] via INTRAVENOUS_CENTRAL
  Filled 2014-10-19: qty 4
  Filled 2014-10-19 (×3): qty 6
  Filled 2014-10-19: qty 2
  Filled 2014-10-19: qty 6

## 2014-10-19 MED ORDER — SODIUM CHLORIDE 0.9 % IV BOLUS (SEPSIS)
2000.0000 mL | Freq: Once | INTRAVENOUS | Status: AC
  Administered 2014-10-19: 2000 mL via INTRAVENOUS

## 2014-10-19 MED ORDER — SODIUM CHLORIDE 0.9 % IV SOLN
250.0000 mL | INTRAVENOUS | Status: DC
  Administered 2014-10-19: 500 mL via INTRAVENOUS

## 2014-10-19 MED ORDER — INSULIN ASPART 100 UNIT/ML ~~LOC~~ SOLN
0.0000 [IU] | SUBCUTANEOUS | Status: DC
  Administered 2014-10-20: 4 [IU] via SUBCUTANEOUS
  Administered 2014-10-20: 3 [IU] via SUBCUTANEOUS
  Administered 2014-10-20: 4 [IU] via SUBCUTANEOUS
  Administered 2014-10-20 (×2): 3 [IU] via SUBCUTANEOUS
  Administered 2014-10-20: 7 [IU] via SUBCUTANEOUS
  Administered 2014-10-21: 4 [IU] via SUBCUTANEOUS
  Administered 2014-10-21: 3 [IU] via SUBCUTANEOUS
  Administered 2014-10-21 (×3): 4 [IU] via SUBCUTANEOUS
  Administered 2014-10-22 – 2014-10-23 (×3): 3 [IU] via SUBCUTANEOUS
  Administered 2014-10-23: 4 [IU] via SUBCUTANEOUS
  Administered 2014-10-23 – 2014-10-24 (×4): 3 [IU] via SUBCUTANEOUS
  Administered 2014-10-24: 4 [IU] via SUBCUTANEOUS
  Administered 2014-10-24: 3 [IU] via SUBCUTANEOUS
  Administered 2014-10-25: 2 [IU] via SUBCUTANEOUS
  Administered 2014-10-25: 4 [IU] via SUBCUTANEOUS
  Administered 2014-10-25: 3 [IU] via SUBCUTANEOUS
  Administered 2014-10-26: 4 [IU] via SUBCUTANEOUS
  Administered 2014-10-26 (×3): 3 [IU] via SUBCUTANEOUS
  Administered 2014-10-27: 15 [IU] via SUBCUTANEOUS
  Administered 2014-10-27: 3 [IU] via SUBCUTANEOUS
  Administered 2014-10-27: 7 [IU] via SUBCUTANEOUS
  Administered 2014-10-27: 3 [IU] via SUBCUTANEOUS
  Administered 2014-10-28: 11 [IU] via SUBCUTANEOUS
  Administered 2014-10-28: 4 [IU] via SUBCUTANEOUS
  Administered 2014-10-28 (×2): 7 [IU] via SUBCUTANEOUS
  Administered 2014-10-29: 3 [IU] via SUBCUTANEOUS
  Administered 2014-10-29: 4 [IU] via SUBCUTANEOUS
  Administered 2014-10-29: 3 [IU] via SUBCUTANEOUS

## 2014-10-19 MED ORDER — FENTANYL CITRATE 0.05 MG/ML IJ SOLN: 50.0000 ug | INTRAMUSCULAR | Status: DC | PRN

## 2014-10-19 MED ORDER — METRONIDAZOLE IN NACL 5-0.79 MG/ML-% IV SOLN
500.0000 mg | Freq: Three times a day (TID) | INTRAVENOUS | Status: DC
  Administered 2014-10-19 – 2014-10-21 (×5): 500 mg via INTRAVENOUS
  Filled 2014-10-19 (×7): qty 100

## 2014-10-19 MED ORDER — MIDAZOLAM HCL 2 MG/2ML IJ SOLN: 1.0000 mg | INTRAMUSCULAR | Status: DC | PRN

## 2014-10-19 MED ORDER — SODIUM CHLORIDE 0.9 % IV SOLN: INTRAVENOUS | Status: DC | PRN

## 2014-10-19 MED ORDER — STERILE WATER FOR INJECTION IV SOLN
INTRAVENOUS | Status: DC
  Administered 2014-10-19 – 2014-10-20 (×4): via INTRAVENOUS_CENTRAL
  Filled 2014-10-19 (×20): qty 150

## 2014-10-19 MED ORDER — INSULIN ASPART 100 UNIT/ML ~~LOC~~ SOLN
0.0000 [IU] | SUBCUTANEOUS | Status: DC
  Administered 2014-10-19: 4 [IU] via SUBCUTANEOUS

## 2014-10-19 MED ORDER — INSULIN ASPART 100 UNIT/ML IV SOLN: 10.0000 [IU] | Freq: Once | INTRAVENOUS | Status: DC

## 2014-10-19 MED ORDER — FAMOTIDINE IN NACL 20-0.9 MG/50ML-% IV SOLN
20.0000 mg | INTRAVENOUS | Status: DC
  Administered 2014-10-20 – 2014-10-24 (×5): 20 mg via INTRAVENOUS
  Filled 2014-10-19 (×5): qty 50

## 2014-10-19 MED ORDER — SODIUM CHLORIDE 0.9 % IV SOLN
INTRAVENOUS | Status: DC
  Administered 2014-10-19 – 2014-10-23 (×3): via INTRAVENOUS

## 2014-10-19 MED ORDER — ROCURONIUM BROMIDE 50 MG/5ML IV SOLN
INTRAVENOUS | Status: AC
  Filled 2014-10-19: qty 2

## 2014-10-19 MED ORDER — VANCOMYCIN HCL IN DEXTROSE 1-5 GM/200ML-% IV SOLN
1000.0000 mg | INTRAVENOUS | Status: AC
  Administered 2014-10-20 – 2014-10-25 (×6): 1000 mg via INTRAVENOUS
  Filled 2014-10-19 (×6): qty 200

## 2014-10-19 MED ORDER — DEXTROSE 5 % IV SOLN
2.0000 g | Freq: Two times a day (BID) | INTRAVENOUS | Status: AC
  Administered 2014-10-19 – 2014-10-25 (×13): 2 g via INTRAVENOUS
  Filled 2014-10-19 (×13): qty 2

## 2014-10-19 MED ORDER — NOREPINEPHRINE BITARTRATE 1 MG/ML IV SOLN
2.0000 ug/min | INTRAVENOUS | Status: DC
  Administered 2014-10-19: 20 ug/min via INTRAVENOUS

## 2014-10-19 MED ORDER — SODIUM BICARBONATE 8.4 % IV SOLN
100.0000 meq | Freq: Once | INTRAVENOUS | Status: AC
  Filled 2014-10-19: qty 100

## 2014-10-19 MED ORDER — MIDAZOLAM HCL 2 MG/2ML IJ SOLN
1.0000 mg | INTRAMUSCULAR | Status: DC | PRN
  Administered 2014-10-20 – 2014-10-21 (×4): 1 mg via INTRAVENOUS
  Filled 2014-10-19 (×4): qty 2

## 2014-10-19 MED ORDER — SODIUM BICARBONATE 8.4 % IV SOLN
INTRAVENOUS | Status: AC
  Administered 2014-10-19: 50 meq
  Filled 2014-10-19: qty 50

## 2014-10-19 MED ORDER — FENTANYL CITRATE 0.05 MG/ML IJ SOLN
50.0000 ug | INTRAMUSCULAR | Status: DC | PRN
  Administered 2014-10-20 – 2014-10-21 (×6): 50 ug via INTRAVENOUS
  Filled 2014-10-19 (×6): qty 2

## 2014-10-19 MED ORDER — SODIUM BICARBONATE 8.4 % IV SOLN
INTRAVENOUS | Status: AC
  Filled 2014-10-19: qty 100

## 2014-10-19 MED ORDER — SODIUM CHLORIDE 0.9 % FOR CRRT
INTRAVENOUS_CENTRAL | Status: DC | PRN
  Administered 2014-10-19: 19:00:00 via INTRAVENOUS_CENTRAL
  Filled 2014-10-19 (×2): qty 1000

## 2014-10-19 MED ORDER — ETOMIDATE 2 MG/ML IV SOLN
20.0000 mg | Freq: Once | INTRAVENOUS | Status: AC
  Administered 2014-10-19: 20 mg via INTRAVENOUS

## 2014-10-19 MED ORDER — STERILE WATER FOR INJECTION IV SOLN
INTRAVENOUS | Status: DC
  Administered 2014-10-19 – 2014-10-20 (×2): via INTRAVENOUS_CENTRAL
  Filled 2014-10-19 (×11): qty 150

## 2014-10-19 MED ORDER — ROCURONIUM BROMIDE 50 MG/5ML IV SOLN
100.0000 mg | Freq: Once | INTRAVENOUS | Status: AC
  Administered 2014-10-19: 100 mg via INTRAVENOUS
  Filled 2014-10-19: qty 10

## 2014-10-19 MED ORDER — SODIUM BICARBONATE 8.4 % IV SOLN
INTRAVENOUS | Status: AC
Start: 2014-10-19 — End: 2014-10-19
  Administered 2014-10-19: 50 meq
  Filled 2014-10-19: qty 50

## 2014-10-19 MED ORDER — FENTANYL CITRATE 0.05 MG/ML IJ SOLN
INTRAMUSCULAR | Status: AC
  Filled 2014-10-19: qty 2

## 2014-10-19 MED ORDER — PRISMASOL BGK 4/2.5 32-4-2.5 MEQ/L IV SOLN
INTRAVENOUS | Status: DC
  Administered 2014-10-19 – 2014-10-26 (×52): via INTRAVENOUS_CENTRAL
  Filled 2014-10-19 (×79): qty 5000

## 2014-10-19 MED ORDER — FAMOTIDINE IN NACL 20-0.9 MG/50ML-% IV SOLN: 20.0000 mg | Freq: Two times a day (BID) | INTRAVENOUS | Status: DC

## 2014-10-19 MED ORDER — SODIUM BICARBONATE 8.4 % IV SOLN
100.0000 meq | Freq: Once | INTRAVENOUS | Status: AC
  Administered 2014-10-19: 100 meq via INTRAVENOUS

## 2014-10-19 MED ORDER — ETOMIDATE 2 MG/ML IV SOLN
INTRAVENOUS | Status: AC
  Filled 2014-10-19: qty 20

## 2014-10-19 MED ORDER — ACETAMINOPHEN 325 MG PO TABS: 650.0000 mg | ORAL_TABLET | Freq: Four times a day (QID) | ORAL | Status: DC | PRN

## 2014-10-19 MED ORDER — ONDANSETRON HCL 4 MG/2ML IJ SOLN: 4.0000 mg | Freq: Four times a day (QID) | INTRAMUSCULAR | Status: DC | PRN

## 2014-10-19 MED ORDER — VANCOMYCIN HCL 10 G IV SOLR: 15.0000 mg/kg | Freq: Once | INTRAVENOUS | Status: DC

## 2014-10-19 MED ORDER — DEXTROSE 5 % IV SOLN
2.0000 ug/min | INTRAVENOUS | Status: DC
  Administered 2014-10-19: 20 ug/min via INTRAVENOUS
  Administered 2014-10-20: 34.987 ug/min via INTRAVENOUS
  Administered 2014-10-21: 20 ug/min via INTRAVENOUS
  Administered 2014-10-21: 20.053 ug/min via INTRAVENOUS
  Administered 2014-10-22: 8 ug/min via INTRAVENOUS
  Administered 2014-10-23: 6 ug/min via INTRAVENOUS
  Filled 2014-10-19 (×8): qty 16

## 2014-10-19 MED ORDER — SUCCINYLCHOLINE CHLORIDE 20 MG/ML IJ SOLN
INTRAMUSCULAR | Status: AC
  Filled 2014-10-19: qty 1

## 2014-10-19 NOTE — Procedures (Signed)
Hemodialysis Catheter Insertion Procedure Note  Krista KalataMary O Buffin [id number] [birth year]  Procedure: Insertion of Hemodialysis Catheter Indications: Hemodialysis   Procedure Details Consent: Risks of procedure as well as the alternatives and risks of each were explained to the (patient/caregiver).  Consent for procedure obtained. Time Out: Verified patient identification, verified procedure, site/side was marked, verified correct patient position, special equipment/implants available, medications/allergies/relevent history reviewed, required imaging and test results available.  Performed  Maximum sterile technique was used including antiseptics, cap, gloves, gown, hand hygiene, mask and sheet. Skin prep: Chlorhexidine; local anesthetic administered A antimicrobial bonded/coated triple lumen catheter was placed in the right internal jugular vein using the Seldinger technique.  Evaluation Blood flow good Complications: No apparent complications Patient did tolerate procedure well. Chest X-ray ordered to verify placement.  CXR: pending.   Procedure performed under direct supervision of Dr. Molli KnockYacoub and with ultrasound guidance for real time vessel cannulation.      Canary BrimBrandi Ollis, NP-C [facility] Pgr: [phone] or [phone]  I was present and supervised the procedure.  Alyson ReedyWesam G. Yacoub, M.D. [facility]. Pager: [phone]. After hours pager: [phone].  10/19/2014, 4:56 PM

## 2014-10-19 NOTE — Progress Notes (Signed)
eLink Physician-Brief Progress Note Patient Name: Krista KalataMary O Puller DOB: 03-19-1946 MRN: [id number]   Date of Service  10/19/2014  HPI/Events of Note  Bradycardia into high 40's transiently. K+ also noted to be 6.2 earlier today. Bradycardia likely related to metabolic acidosis and hyperkalemia. CVVHD started about 10 minutes ago.   eICU Interventions  Will order: 1. NaHCO3 100 meq IV now. 2. Repeat ABG and BMP at 9 PM.      Intervention Category Major Interventions: Acid-Base disturbance - evaluation and management;Electrolyte abnormality - evaluation and management Minor Interventions: Communication with other healthcare providers and/or family  Lenell AntuSommer,Cully Luckow Eugene 10/19/2014, 8:02 PM

## 2014-10-19 NOTE — ED Notes (Signed)
CCM at bedside placing HD catheter. Pt tolerating without difficulty.
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
## 2014-10-19 NOTE — Progress Notes (Signed)
   10/19/14 2000  Vitals  BP (!) 52/37 mmHg  MAP (mmHg) 41  BP Location Left Arm  BP Method Automatic  Patient Position (if appropriate) Lying  Pulse Rate 73  Pulse Rate Source Monitor  ECG Heart Rate 73  Cardiac Rhythm NSR  Resp 17  Oxygen Therapy  SpO2 (!) 60 %  O2 Device Ventilator  FiO2 (%) 100 %  Pulse Oximetry Type Continuous  Invasive Hemodynamic Monitoring  CVP (mmHg) 23 mmHg  Pain Assessment  Pain Assessment CPOT  Critical Care Pain Observation Tool (CPOT)  Facial Expression 0  Body Movements 0  Muscle Tension 0  Compliance with ventilator (intubated pts.) 0  Vocalization (extubated pts.) N/A  CPOT Total 0  PCA/Epidural/Spinal Assessment  Respiratory Pattern Labored;Accessory muscle use  Glasgow Coma Scale  Eye Opening 1  Modified Verbal Response (INTUBATED) 1  Best Motor Response 1  Glasgow Coma Scale Score 3  Dr. Dellie CatholicSommers notified of drops in HR to the 40's and sats continue in 50's and 60's and sbp 50-60's on Levophed. Sodium Bicarbonate 100 meq IV given.

## 2014-10-19 NOTE — ED Notes (Signed)
Potassium of 7.3 notified EDP
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
## 2014-10-19 NOTE — ED Notes (Addendum)
Pt presents to department via [facility] for evaluation of nausea/vomiting and diarrhea. Also states generalized abdominal pain. Symptoms started this past Thursday. 5/10 abdominal pain upon arrival to ED. CBG 125, received 4mg  Zofran per EMS. Pt is alert and oriented x4. Pt wears 2L O2 at home.

## 2014-10-19 NOTE — Progress Notes (Signed)
eLink Physician-Brief Progress Note Patient Name: Krista Wise DOB: 10/22/1945 MRN: [id number]   Date of Service  10/19/2014  HPI/Events of Note  Nurse requests A-line placement.   eICU Interventions  Will order A-line placed.      Intervention Category Major Interventions: Respiratory failure - evaluation and management  Natarsha Hurwitz Eugene 10/19/2014, 7:22 PM

## 2014-10-19 NOTE — ED Notes (Signed)
Lab results given to Dr.Zavitz.

## 2014-10-19 NOTE — Progress Notes (Signed)
NP changed PEEP to 8 cmH2O about 20 min prior to ABG

## 2014-10-19 NOTE — Progress Notes (Signed)
PT ID was checked by MRN and date of birth and name be for giving neb treatment

## 2014-10-19 NOTE — Progress Notes (Signed)
   10/19/14 1800  Vitals  BP (!) 88/43 mmHg  MAP (mmHg) 54  BP Location Right Arm  BP Method Automatic  Patient Position (if appropriate) Lying  Pulse Rate 74  Pulse Rate Source Monitor  ECG Heart Rate 74  Cardiac Rhythm NSR  Resp (!) 21  Oxygen Therapy  SpO2 100 %  O2 Device Ventilator  FiO2 (%) 100 %  Pulse Oximetry Type Continuous  SpO2 Alarm Limit Low (!) 90  Oximetry Probe Site Changed Yes  Pre-WUA / WUA Start  [place] Agitation Sedation Scale (RASS) -1  RASS Goal -1  Pain Assessment  Pain Assessment CPOT  Critical Care Pain Observation Tool (CPOT)  Facial Expression 0  Body Movements 0  Muscle Tension 0  Compliance with ventilator (intubated pts.) 0  Vocalization (extubated pts.) N/A  CPOT Total 0  Sodium bicarbonate 50 meq IV given per order.

## 2014-10-19 NOTE — Progress Notes (Signed)
   10/19/14 1745  Vitals  Pulse Rate 73  ECG Heart Rate 74  Resp 19  Oxygen Therapy  SpO2 (!) 87 %  Notified RT of decreased sats.. Recruitment performed by RT and ABG drawn at 1805. [facility]

## 2014-10-19 NOTE — Progress Notes (Signed)
CRITICAL VALUE ALERT  Critical value received: lactic acid 8.0, expected  Date of notification:  10/19/2014  Time of notification:  1841  Critical value read back:Yes.    Nurse who received alert:  Janey GreaserLisa Rossi Burdo,RN  MD notified (1st page):  Dr. Dellie CatholicSommers  Time of first page:  1850  MD notified (2nd page):  Time of second page:  Responding MD:  Dr. Dellie CatholicSommers  Time MD responded:  [phone]

## 2014-10-19 NOTE — Progress Notes (Signed)
CRITICAL VALUE ALERT  Critical value received:  Potassium 6.2  Date of notification:  10/19/2014  Time of notification:  1822  Critical value read back:Yes.    Nurse who received alert:  Janey GreaserLisa Milon Dethloff,RN  MD notified (1st page):  Dr. Dellie CatholicSommers  Time of first page:  1830  MD notified (2nd page):  Time of second page:  Responding MD:  Dr. Dellie CatholicSommers  Time MD responded:  [phone]

## 2014-10-19 NOTE — Progress Notes (Addendum)
eLink Physician-Brief Progress Note Patient Name: Krista KalataMary O Dineen DOB: 03/11/1946 MRN: [id number]   Date of Service  10/19/2014  HPI/Events of Note  SvO2 = 21%. Question if accurate. However, SBP = 80's. Last pH = 6.98. 9 PM ABG not obtained. CVP earlier tonight = 23. Hgb = 10.7. LVEF from 05/2014 = 55% to 60%. K+ now = 5.0. However, CVVHD catheter has clotted.   eICU Interventions  Will need to obtain ABG ordered at 9 PM and will check a Lactic Acid level as well.      Intervention Category Major Interventions: Acid-Base disturbance - evaluation and management Minor Interventions: Communication with other healthcare providers and/or family  Lenell AntuSommer,Steven Eugene 10/19/2014, 10:40 PM
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
## 2014-10-19 NOTE — ED Notes (Signed)
RSI successful. 7.5 ET tube, 22 at lip. Positive color change. Equal bilateral lung sounds.

## 2014-10-19 NOTE — Progress Notes (Signed)
IV team to come TPA line, MD notified

## 2014-10-19 NOTE — Progress Notes (Signed)
   10/19/14 1900  Vitals  BP (!) 80/44 mmHg  MAP (mmHg) 53  Pulse Rate 81  ECG Heart Rate 81  Resp (!) 22  Oxygen Therapy  SpO2 (!) 84 %   Discussed labs, decreased O2 sats and  sbp in 50-60's with Dr. Dellie CatholicSommers. Sodium Bicarbonate 100 meq IV given per order.
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
## 2014-10-19 NOTE — Progress Notes (Signed)
eLink Physician-Brief Progress Note Patient Name: Krista KalataMary O Wise DOB: 1946/05/10 MRN: [id number]   Date of Service  10/19/2014  HPI/Events of Note  Called with ABG = 6.98/43.1/388.0/10. Patient has had diarrhea and likely has had loss of HCO3- in stool. To start on CVVH with HCO3- in dialysate as soon as nurse can set it up.   eICU Interventions  Will order: 1. Sodium Bicarbinate 100 meq IV now.  2. Check ABG at 9 PM.      Intervention Category Major Interventions: Acid-Base disturbance - evaluation and management Minor Interventions: Communication with other healthcare providers and/or family  Lenell AntuSommer,Aalaya Yadao Eugene 10/19/2014, 7:02 PM
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
## 2014-10-19 NOTE — ED Notes (Signed)
Dr. Jodi MourningZavitz at bedside preparing to intubate patient. CCM also at bedside.

## 2014-10-20 ENCOUNTER — Inpatient Hospital Stay (HOSPITAL_COMMUNITY): Payer: Medicare Other

## 2014-10-20 DIAGNOSIS — N170 Acute kidney failure with tubular necrosis: Secondary | ICD-10-CM

## 2014-10-20 DIAGNOSIS — I959 Hypotension, unspecified: Secondary | ICD-10-CM

## 2014-10-20 DIAGNOSIS — R0600 Dyspnea, unspecified: Secondary | ICD-10-CM

## 2014-10-20 DIAGNOSIS — J9601 Acute respiratory failure with hypoxia: Secondary | ICD-10-CM

## 2014-10-20 LAB — POCT I-STAT 3, ART BLOOD GAS (G3+)
ACID-BASE EXCESS: 1 mmol/L (ref 0.0–2.0)
Bicarbonate: 25 mEq/L — ABNORMAL HIGH (ref 20.0–24.0)
Bicarbonate: 26.7 mEq/L — ABNORMAL HIGH (ref 20.0–24.0)
O2 Saturation: 98 %
O2 Saturation: 97 %
PH ART: 7.342 — AB (ref 7.350–7.450)
PO2 ART: 101 mmHg — AB (ref 80.0–100.0)
Patient temperature: 37.7
Patient temperature: 37.7
TCO2: 26 mmol/L (ref 0–100)
TCO2: 28 mmol/L (ref 0–100)
pCO2 arterial: 44.4 mmHg (ref 35.0–45.0)
pCO2 arterial: 49.7 mmHg — ABNORMAL HIGH (ref 35.0–45.0)
pH, Arterial: 7.362 (ref 7.350–7.450)
pO2, Arterial: 106 mmHg — ABNORMAL HIGH (ref 80.0–100.0)

## 2014-10-20 LAB — RENAL FUNCTION PANEL
ALBUMIN: 2.3 g/dL — AB (ref 3.5–5.2)
Albumin: 2.2 g/dL — ABNORMAL LOW (ref 3.5–5.2)
Anion gap: 24 — ABNORMAL HIGH (ref 5–15)
Anion gap: 17 — ABNORMAL HIGH (ref 5–15)
BUN: 80 mg/dL — ABNORMAL HIGH (ref 6–23)
BUN: 62 mg/dL — ABNORMAL HIGH (ref 6–23)
CHLORIDE: 94 mmol/L — AB (ref 96–112)
CO2: 19 mmol/L (ref 19–32)
CO2: 25 mmol/L (ref 19–32)
CREATININE: 8.3 mg/dL — AB (ref 0.50–1.10)
CREATININE: 6.5 mg/dL — AB (ref 0.50–1.10)
Calcium: 6.9 mg/dL — ABNORMAL LOW (ref 8.4–10.5)
Calcium: 6.1 mg/dL — CL (ref 8.4–10.5)
Chloride: 94 mmol/L — ABNORMAL LOW (ref 96–112)
GFR calc Af Amer: 5 mL/min — ABNORMAL LOW (ref >90)
GFR calc Af Amer: 7 mL/min — ABNORMAL LOW (ref >90)
GFR calc non Af Amer: 6 mL/min — ABNORMAL LOW (ref >90)
GFR, EST NON AFRICAN AMERICAN: 4 mL/min — AB (ref >90)
GLUCOSE: 144 mg/dL — AB (ref 70–99)
Glucose, Bld: 223 mg/dL — ABNORMAL HIGH (ref 70–99)
PHOSPHORUS: 10.6 mg/dL — AB (ref 2.3–4.6)
Phosphorus: 8.1 mg/dL — ABNORMAL HIGH (ref 2.3–4.6)
Potassium: 5.7 mmol/L — ABNORMAL HIGH (ref 3.5–5.1)
Potassium: 5 mmol/L (ref 3.5–5.1)
Sodium: 137 mmol/L (ref 135–145)
Sodium: 136 mmol/L (ref 135–145)

## 2014-10-20 LAB — TROPONIN I: Troponin I: 11.22 ng/mL (ref <0.031)

## 2014-10-20 LAB — CBC
HEMATOCRIT: 34.4 % — AB (ref 36.0–46.0)
Hemoglobin: 10.7 g/dL — ABNORMAL LOW (ref 12.0–15.0)
MCH: 25.2 pg — ABNORMAL LOW (ref 26.0–34.0)
MCHC: 31.1 g/dL (ref 30.0–36.0)
MCV: 80.9 fL (ref 78.0–100.0)
Platelets: 376 10*3/uL (ref 150–400)
RBC: 4.25 MIL/uL (ref 3.87–5.11)
RDW: 17.1 % — ABNORMAL HIGH (ref 11.5–15.5)
WBC: 21.2 10*3/uL — AB (ref 4.0–10.5)

## 2014-10-20 LAB — BLOOD GAS, ARTERIAL
ACID-BASE DEFICIT: 6.7 mmol/L — AB (ref 0.0–2.0)
Acid-base deficit: 6 mmol/L — ABNORMAL HIGH (ref 0.0–2.0)
BICARBONATE: 19 meq/L — AB (ref 20.0–24.0)
BICARBONATE: 18.7 meq/L — AB (ref 20.0–24.0)
Drawn by: 362771
Drawn by: 313941
FIO2: 1 %
FIO2: 0.6 %
MECHVT: 480 mL
MECHVT: 480 mL
O2 SAT: 99.3 %
O2 Saturation: 99.1 %
PATIENT TEMPERATURE: 98.6
PCO2 ART: 35.7 mmHg (ref 35.0–45.0)
PEEP/CPAP: 8 cmH2O
PEEP: 8 cmH2O
PH ART: 7.339 — AB (ref 7.350–7.450)
PO2 ART: 211 mmHg — AB (ref 80.0–100.0)
Patient temperature: 98.6
RATE: 16 resp/min
RATE: 16 resp/min
TCO2: 20.3 mmol/L (ref 0–100)
TCO2: 19.8 mmol/L (ref 0–100)
pCO2 arterial: 42.8 mmHg (ref 35.0–45.0)
pH, Arterial: 7.27 — ABNORMAL LOW (ref 7.350–7.450)
pO2, Arterial: 286 mmHg — ABNORMAL HIGH (ref 80.0–100.0)

## 2014-10-20 LAB — URINALYSIS, ROUTINE W REFLEX MICROSCOPIC
Bilirubin Urine: NEGATIVE
Glucose, UA: 250 mg/dL — AB
KETONES UR: 15 mg/dL — AB
LEUKOCYTES UA: NEGATIVE
NITRITE: NEGATIVE
PH: 5 (ref 5.0–8.0)
Protein, ur: >300 mg/dL — AB
Specific Gravity, Urine: 1.017 (ref 1.005–1.030)
Urobilinogen, UA: 0.2 mg/dL (ref 0.0–1.0)

## 2014-10-20 LAB — GLUCOSE, CAPILLARY
GLUCOSE-CAPILLARY: 134 mg/dL — AB (ref 70–99)
GLUCOSE-CAPILLARY: 143 mg/dL — AB (ref 70–99)
Glucose-Capillary: 126 mg/dL — ABNORMAL HIGH (ref 70–99)
Glucose-Capillary: 130 mg/dL — ABNORMAL HIGH (ref 70–99)
Glucose-Capillary: 158 mg/dL — ABNORMAL HIGH (ref 70–99)
Glucose-Capillary: 170 mg/dL — ABNORMAL HIGH (ref 70–99)
Glucose-Capillary: 207 mg/dL — ABNORMAL HIGH (ref 70–99)

## 2014-10-20 LAB — BASIC METABOLIC PANEL
ANION GAP: 26 — AB (ref 5–15)
BUN: 81 mg/dL — ABNORMAL HIGH (ref 6–23)
CALCIUM: 6.9 mg/dL — AB (ref 8.4–10.5)
CO2: 19 mmol/L (ref 19–32)
Chloride: 92 mmol/L — ABNORMAL LOW (ref 96–112)
Creatinine, Ser: 8.17 mg/dL — ABNORMAL HIGH (ref 0.50–1.10)
GFR, EST AFRICAN AMERICAN: 5 mL/min — AB (ref >90)
GFR, EST NON AFRICAN AMERICAN: 5 mL/min — AB (ref >90)
Glucose, Bld: 217 mg/dL — ABNORMAL HIGH (ref 70–99)
Potassium: 5.8 mmol/L — ABNORMAL HIGH (ref 3.5–5.1)
SODIUM: 137 mmol/L (ref 135–145)

## 2014-10-20 LAB — URINE MICROSCOPIC-ADD ON

## 2014-10-20 LAB — MAGNESIUM
MAGNESIUM: 1.5 mg/dL (ref 1.5–2.5)
Magnesium: 1.5 mg/dL (ref 1.5–2.5)

## 2014-10-20 LAB — POCT ACTIVATED CLOTTING TIME
ACTIVATED CLOTTING TIME: 177 s
ACTIVATED CLOTTING TIME: 140 s
ACTIVATED CLOTTING TIME: 153 s
Activated Clotting Time: 147 seconds
Activated Clotting Time: 146 seconds
Activated Clotting Time: 153 seconds
Activated Clotting Time: 133 seconds
Activated Clotting Time: 153 seconds
Activated Clotting Time: 159 seconds

## 2014-10-20 LAB — PROCALCITONIN: Procalcitonin: 4.01 ng/mL

## 2014-10-20 LAB — CULTURE, BLOOD (ROUTINE X 2)

## 2014-10-20 LAB — CREATININE, URINE, RANDOM: CREATININE, URINE: 53.3 mg/dL

## 2014-10-20 LAB — LACTIC ACID, PLASMA: LACTIC ACID, VENOUS: 5.3 mmol/L — AB (ref 0.5–2.0)

## 2014-10-20 LAB — SODIUM, URINE, RANDOM: Sodium, Ur: 73 mmol/L

## 2014-10-20 LAB — PHOSPHORUS: PHOSPHORUS: 10.6 mg/dL — AB (ref 2.3–4.6)

## 2014-10-20 LAB — CORTISOL: Cortisol, Plasma: 67.4 ug/dL

## 2014-10-20 LAB — APTT: APTT: 24 s (ref 24–37)

## 2014-10-20 MED ORDER — HEPARIN BOLUS VIA INFUSION (CRRT)
1000.0000 [IU] | INTRAVENOUS | Status: DC | PRN
  Filled 2014-10-20: qty 1000

## 2014-10-20 MED ORDER — SODIUM CHLORIDE 0.9 % IV SOLN
1.0000 g | Freq: Once | INTRAVENOUS | Status: AC
  Administered 2014-10-20: 1 g via INTRAVENOUS
  Filled 2014-10-20: qty 10

## 2014-10-20 MED ORDER — PRISMASOL BGK 4/2.5 32-4-2.5 MEQ/L IV SOLN
INTRAVENOUS | Status: DC
  Administered 2014-10-20 – 2014-10-26 (×6): via INTRAVENOUS_CENTRAL
  Filled 2014-10-20 (×16): qty 5000

## 2014-10-20 MED ORDER — DEXTROSE 5 % IV SOLN
INTRAVENOUS | Status: DC
  Administered 2014-10-20 – 2014-10-22 (×6): via INTRAVENOUS
  Filled 2014-10-20 (×9): qty 1000

## 2014-10-20 MED ORDER — PRO-STAT SUGAR FREE PO LIQD
30.0000 mL | Freq: Two times a day (BID) | ORAL | Status: DC
  Administered 2014-10-20: 30 mL
  Filled 2014-10-20 (×3): qty 30

## 2014-10-20 MED ORDER — PRISMASOL BGK 4/2.5 32-4-2.5 MEQ/L IV SOLN
INTRAVENOUS | Status: DC
Start: 2014-10-20 — End: 2014-10-27
  Administered 2014-10-20 – 2014-10-26 (×8): via INTRAVENOUS_CENTRAL
  Filled 2014-10-20 (×7): qty 5000

## 2014-10-20 MED ORDER — SILVER SULFADIAZINE 1 % EX CREA
TOPICAL_CREAM | Freq: Every day | CUTANEOUS | Status: DC
  Administered 2014-10-20 – 2014-10-23 (×2): via TOPICAL
  Administered 2014-10-23: 1 via TOPICAL
  Administered 2014-10-23: 18:00:00 via TOPICAL
  Administered 2014-10-25: 1 via TOPICAL
  Administered 2014-10-26 – 2014-10-28 (×3): via TOPICAL
  Administered 2014-10-29: 1 via TOPICAL
  Administered 2014-10-30 – 2014-11-14 (×15): via TOPICAL
  Filled 2014-10-20: qty 85

## 2014-10-20 MED ORDER — MAGNESIUM SULFATE 2 GM/50ML IV SOLN
INTRAVENOUS | Status: AC
  Administered 2014-10-20: 1 g
  Filled 2014-10-20: qty 50

## 2014-10-20 MED ORDER — VITAL HIGH PROTEIN PO LIQD
1000.0000 mL | ORAL | Status: DC
  Administered 2014-10-20: 1000 mL
  Filled 2014-10-20 (×3): qty 1000

## 2014-10-20 MED ORDER — MAGNESIUM SULFATE IN D5W 10-5 MG/ML-% IV SOLN
1.0000 g | Freq: Once | INTRAVENOUS | Status: AC
  Filled 2014-10-20: qty 100

## 2014-10-20 MED ORDER — SODIUM CHLORIDE 0.9 % IJ SOLN
250.0000 [IU]/h | INTRAMUSCULAR | Status: DC
  Administered 2014-10-20: 1000 [IU]/h via INTRAVENOUS_CENTRAL
  Administered 2014-10-20: 2250 [IU]/h via INTRAVENOUS_CENTRAL
  Administered 2014-10-20 (×4): 1000 [IU]/h via INTRAVENOUS_CENTRAL
  Administered 2014-10-21: 3000 [IU]/h via INTRAVENOUS_CENTRAL
  Administered 2014-10-21: 3200 [IU]/h via INTRAVENOUS_CENTRAL
  Administered 2014-10-21: 2350 [IU]/h via INTRAVENOUS_CENTRAL
  Administered 2014-10-22: 3650 [IU]/h via INTRAVENOUS_CENTRAL
  Administered 2014-10-22: 3250 [IU]/h via INTRAVENOUS_CENTRAL
  Administered 2014-10-22: 3300 [IU]/h via INTRAVENOUS_CENTRAL
  Administered 2014-10-23: 5050 [IU]/h via INTRAVENOUS_CENTRAL
  Administered 2014-10-23: 4650 [IU]/h via INTRAVENOUS_CENTRAL
  Administered 2014-10-23: 3900 [IU]/h via INTRAVENOUS_CENTRAL
  Administered 2014-10-24: 5450 [IU]/h via INTRAVENOUS_CENTRAL
  Administered 2014-10-24: 5400 [IU]/h via INTRAVENOUS_CENTRAL
  Administered 2014-10-24: 5450 [IU]/h via INTRAVENOUS_CENTRAL
  Administered 2014-10-24 (×2): 5500 [IU]/h via INTRAVENOUS_CENTRAL
  Administered 2014-10-24: 5450 [IU]/h via INTRAVENOUS_CENTRAL
  Administered 2014-10-25 (×4): 5500 [IU]/h via INTRAVENOUS_CENTRAL
  Administered 2014-10-25 – 2014-10-26 (×5): 4000 [IU]/h via INTRAVENOUS_CENTRAL
  Filled 2014-10-20 (×28): qty 2

## 2014-10-20 MED ORDER — AMIODARONE LOAD VIA INFUSION
150.0000 mg | Freq: Once | INTRAVENOUS | Status: DC
  Filled 2014-10-20: qty 83.34

## 2014-10-20 MED ORDER — IOHEXOL 300 MG/ML  SOLN
25.0000 mL | INTRAMUSCULAR | Status: AC
  Administered 2014-10-20 (×2): 25 mL via ORAL

## 2014-10-20 MED ORDER — SILVER SULFADIAZINE 1 % EX CREA
TOPICAL_CREAM | Freq: Two times a day (BID) | CUTANEOUS | Status: DC
  Filled 2014-10-20: qty 85

## 2014-10-20 MED ORDER — AMIODARONE HCL IN DEXTROSE 360-4.14 MG/200ML-% IV SOLN: 60.0000 mg/h | INTRAVENOUS | Status: DC

## 2014-10-20 MED ORDER — VANCOMYCIN 50 MG/ML ORAL SOLUTION
250.0000 mg | Freq: Four times a day (QID) | ORAL | Status: DC
  Administered 2014-10-20 – 2014-10-21 (×4): 250 mg
  Filled 2014-10-20 (×8): qty 5

## 2014-10-20 MED ORDER — AMIODARONE HCL IN DEXTROSE 360-4.14 MG/200ML-% IV SOLN: 30.0000 mg/h | INTRAVENOUS | Status: DC

## 2014-10-20 MED ORDER — PERFLUTREN LIPID MICROSPHERE
1.0000 mL | INTRAVENOUS | Status: AC | PRN
  Filled 2014-10-20: qty 10

## 2014-10-20 NOTE — Progress Notes (Signed)
eLink Physician-Brief Progress Note Patient Name: Krista KalataMary O Pizano DOB: 07/16/1946 MRN: [id number]   Date of Service  10/20/2014  HPI/Events of Note  Troponin = 11.2, Mg++ = 1.5 and Creatinine = 6.5. Troponin elevation may be d/t renal failure.  eICU Interventions  Will order: 1. Magnesium Sulfate 1 gm IV now. 2. 12 Lead EKG now.  If EKG demonstrates myocardial injury, will add Rx for ACS.     Intervention Category Major Interventions: Arrhythmia - evaluation and management Minor Interventions: Communication with other healthcare providers and/or family  Lenell AntuSommer,Nalleli Largent Eugene 10/20/2014, 10:33 PM
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
## 2014-10-20 NOTE — Progress Notes (Signed)
CRITICAL VALUE ALERT  Critical value received:  Lactate 8.6  Date of notification:  10/20/2014  Time of notification:  0020  Critical value read back:Yes.    Nurse who received alert:  Ernie HewJohn Perrin RN/ Holland Fallingebecca Moxon Messler RN  MD notified (1st page):  N/a (expected value)  Time of first page:  n/a  MD notified (2nd page):  Time of second page:  Responding MD:  n/a  Time MD responded:  n/a

## 2014-10-20 NOTE — Progress Notes (Signed)
  Echocardiogram 2D Echocardiogram has been performed.  Janalyn HarderWest, Sherryll Skoczylas R 10/20/2014, 2:59 PM

## 2014-10-20 NOTE — Progress Notes (Signed)
eLink Physician-Brief Progress Note Patient Name: Krista Wise DOB: January 16, 1946 MRN: [id number]   Date of Service  10/20/2014  HPI/Events of Note  Last ABG = 7.34/49.7/101 97%  eICU Interventions  Will D/C Q 4 hour ABG's and A-line placement order.      Intervention Category Major Interventions: Acid-Base disturbance - evaluation and management Minor Interventions: Routine modifications to care plan (e.g. PRN medications for pain, fever);Communication with other healthcare providers and/or family  Krista Wise 10/20/2014, 9:51 PM

## 2014-10-20 NOTE — Progress Notes (Signed)
CRITICAL VALUE ALERT  Critical value received:  Lactate 5.3   Date of notification:  10/20/2014  Time of notification:  0322  Critical value read back:Yes.    Nurse who received alert:  Holland Fallingebecca Idris Edmundson RN  MD notified (1st page):  N/a (expected improving value)  Time of first page:  n/a  MD notified (2nd page):  Time of second page:  Responding MD:  n/a  Time MD responded:  n/a

## 2014-10-20 NOTE — Progress Notes (Signed)
CRITICAL VALUE ALERT  Critical value received:  Calcium 6.1  Date of notification:  10/20/14  Time of notification:  1648  Critical value read back: Yes  Nurse who received alert:  Domenica Failebekah Romi Rathel, RN  MD notified (1st page):  PCCM  Time of first page:  1650  MD notified (2nd page): N/A  Time of second page: N/A  Responding MD:  Dr. Arsenio LoaderSommer  Time MD responded:  [phone]
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
## 2014-10-20 NOTE — Progress Notes (Addendum)
eLink Physician-Brief Progress Note Patient Name: Krista KalataMary O Wise DOB: October 27, 1945 MRN: [id number]   Date of Service  10/20/2014  HPI/Events of Note  AFIB with ventricular rate = 120's to 130's. K+ = 5.0 at 4:19 PM. Now has broken spontaneously to NSR with rat =70.   eICU Interventions  Will order: 1. Magnesium level now.  2. Troponin I now and Q 6 hours X 2 (total 3 sets).     Intervention Category Major Interventions: Acid-Base disturbance - evaluation and management;Arrhythmia - evaluation and management Minor Interventions: Communication with other healthcare providers and/or family  Lenell AntuSommer,Steven Eugene 10/20/2014, 8:54 PM

## 2014-10-20 NOTE — Progress Notes (Signed)
PULMONARY / CRITICAL CARE MEDICINE   Name: Krista Wise MRN: [id number] DOB: November 13, 1945    ADMISSION DATE:  10/19/2014 CONSULTATION DATE:  10/19/14    REFERRING MD :  Dr. Jodi MourningZavitz  CHIEF COMPLAINT:  Hypotension / Acute Renal Failure, Lactic Acidosis   INITIAL PRESENTATION: 69 y/o F admitted with a 4 day history of nausea, vomiting, diarrhea.  Work up consistent with hypotension, AKI, lactic acidosis and acute respiratory failure.    STUDIES:  3/27 ECHO >>   SIGNIFICANT EVENTS: 3/27  Admit with 4 day hx of N/V/D.  Hypotensive / hypoxic in ER, intubated.   SUBJECTIVE:  Started CVVHD 3/27 pm but significant problems with filters clotting  Hemodynamics improving  VITAL SIGNS: Temp:  [95.3 F (35.2 C)-99 F (37.2 C)] 98.4 F (36.9 C) (03/28 0800) Pulse Rate:  [55-91] 72 (03/28 1030) Resp:  [14-38] 16 (03/28 1030) BP: (52-137)/(32-67) 114/50 mmHg (03/28 1015) SpO2:  [58 %-100 %] 99 % (03/28 1030) FiO2 (%):  [50 %-100 %] 60 % (03/28 1030) Weight:  [105 kg (231 lb 7.7 oz)-117.4 kg (258 lb 13.1 oz)] 117.4 kg (258 lb 13.1 oz) (03/28 0500)   HEMODYNAMICS: CVP:  [13 mmHg-23 mmHg] 13 mmHg   VENTILATOR SETTINGS: Vent Mode:  [-] PRVC FiO2 (%):  [50 %-100 %] 60 % Set Rate:  [16 bmp] 16 bmp Vt Set:  [480 mL] 480 mL PEEP:  [5 cmH20-8 cmH20] 8 cmH20 Plateau Pressure:  [22 cmH20-24 cmH20] 22 cmH20   INTAKE / OUTPUT:  Intake/Output Summary (Last 24 hours) at 10/20/14 1048 Last data filed at 10/20/14 1000  Gross per 24 hour  Intake 7245.95 ml  Output   1274 ml  Net 5971.95 ml    PHYSICAL EXAMINATION: General:  Morbidly obese female, intubated, sedated Neuro:  Asleep, wakes to voice, moves ext HEENT:  Mm pink/dry, short / thick neck  Cardiovascular:  s1s2 rrr, no m/r/g  Lungs:  resp's even/non-labored, lungs bilaterally diminished  Abdomen:  Obese, soft, bsx4 active  Musculoskeletal:  No acute deformities  Skin:  Warm/dry, LLE burn - wound clean, pink tissue, no pus or  drainage  LABS:  CBC  Recent Labs Lab 10/19/14 1405 10/19/14 1415 10/20/14 0430  WBC 8.3  --  21.2*  HGB 10.2* 11.6* 10.7*  HCT 34.1* 34.0* 34.4*  PLT 324  --  376   Coag's  Recent Labs Lab 10/20/14 0430  APTT 24     BMET  Recent Labs Lab 10/19/14 2030 10/20/14 0430 10/20/14 0500  NA 137 137 137  K 5.0 5.8* 5.7*  CL 93* 92* 94*  CO2 15* 19 19  BUN 82* 81* 80*  CREATININE 8.26* 8.17* 8.30*  GLUCOSE 162* 217* 223*   Electrolytes  Recent Labs Lab 10/19/14 1716 10/19/14 2030 10/20/14 0430 10/20/14 0500  CALCIUM 7.4* 6.8* 6.9* 6.9*  MG 1.6  --  1.5  --   PHOS 11.9*  12.1*  --  10.6* 10.6*   Sepsis Markers  Recent Labs Lab 10/19/14 1716 10/19/14 2245 10/20/14 0200 10/20/14 0430  LATICACIDVEN 8.0* 8.6* 5.3*  --   PROCALCITON 0.23  --   --  4.01   ABG  Recent Labs Lab 10/19/14 2302 10/20/14 0410 10/20/14 0740  PHART 7.153* 7.270* 7.339*  PCO2ART 36.5 42.8 35.7  PO2ART 101.0* 286.0* 211.0*   Liver Enzymes  Recent Labs Lab 10/19/14 1405 10/19/14 1716 10/20/14 0500  AST 26  --   --   ALT 13  --   --  ALKPHOS 47  --   --   BILITOT 0.8  --   --   ALBUMIN 2.8* 2.3* 2.3*   Cardiac Enzymes No results for input(s): TROPONINI, PROBNP in the last 168 hours.   Glucose  Recent Labs Lab 10/19/14 1920 10/20/14 0036 10/20/14 0401 10/20/14 0744  GLUCAP 183* 170* 207* 143*    Imaging Dg Chest Portable 1 View  10/19/2014   CLINICAL DATA:  70 year old female status post hemodialysis catheter placement.  EXAM: PORTABLE CHEST - 1 VIEW  COMPARISON:  Chest x-ray 10/19/2014.  FINDINGS: An endotracheal tube is in place with tip 3.2 cm above the carina. A nasogastric tube is seen extending into the stomach, however, the tip of the nasogastric tube extends below the lower margin of the image. Right IJ vas cath in position with tip terminating at the superior cavoatrial junction. Lung volumes are low. Diffuse interstitial prominence. Patchy bibasilar  opacities may reflect areas of atelectasis and/or consolidation (left greater than right). Moderate left pleural effusion. Cardiomegaly. The patient is rotated to the left on today's exam, resulting in distortion of the mediastinal contours and reduced diagnostic sensitivity and specificity for mediastinal pathology. Atherosclerosis in the thoracic aorta.  IMPRESSION: 1. Support apparatus, as above. 2. Enlarging moderate left pleural effusion with bibasilar opacities (left greater than right), which may reflect areas of atelectasis and/or consolidation. 3. Cardiomegaly. 4. Atherosclerosis.   Electronically Signed   By: Trudie Reed M.D.   On: 10/19/2014 17:00   Dg Chest Portable 1 View  10/19/2014   CLINICAL DATA:  Endotracheal tube placement  EXAM: PORTABLE CHEST - 1 VIEW  COMPARISON:  Prior film same day  FINDINGS: Cardiomegaly again noted. Again noted left basilar atelectasis or infiltrate. Endotracheal tube in place with tip 2.8 cm above the carina. No pneumothorax.  IMPRESSION: Cardiomegaly again noted. Endotracheal tube in place. No pneumothorax. Persistent left basilar atelectasis or infiltrate.   Electronically Signed   By: Natasha Mead M.D.   On: 10/19/2014 15:16   Dg Chest Portable 1 View  10/19/2014   CLINICAL DATA:  Sepsis.  Nausea, vomiting and diarrhea.  EXAM: PORTABLE CHEST - 1 VIEW  COMPARISON:  07/18/2014  FINDINGS: Stable cardiac enlargement. There is tortuosity, ectasia and calcification of the thoracic aorta. Increased left lower lobe density without obvious effusion. Could not exclude an infiltrate but recommend dedicated two-view chest x-ray when able for better evaluation. The bony thorax is intact.  IMPRESSION: Cardiac enlargement, stable  Increased density in the left lower thorax. Recommend followup two view chest x-ray.   Electronically Signed   By: Rudie Meyer M.D.   On: 10/19/2014 14:38     ASSESSMENT / PLAN:  PULMONARY OETT 3/27 >>  A: Acute Respiratory Failure -  suspect in setting of profound volume depletion, AKI with possible viral ilness (GI) vs sepsis  OSA - does not wear CPAP.  Wears O2 intermittently at home  L Pleural Effusion  P:   MV support  Wean PEEP/FiO2 for sats > 92% PRN albuterol  Trend CXR   CARDIOVASCULAR R IJ HD Cath 3/27 >>  A:  Hypotension - septic shock vs severe volume depletion  HTN  CHF CAD s/p Stent P:  Hold home regimen - lipitor, bisoprolol, demadex Assess ECHO   RENAL A:   Acute Kidney Injury Hyperkalemia   P:   Appreciate Nephrology input. CVVHD per nephrology's recommendations. Bicarb gtt Repeat BMP now. Trend electrolytes, replace as indicated. OK to add heparin to CVVHD   GASTROINTESTINAL  A:   Nausea / Vomiting / Diarrhea  P:   See ID  SUP: pepcid  NPO, place OGT Begin TF am 3/28 CT ABD/Pelvis without IV contrast ordered and pending  HEMATOLOGIC A:   Mild Anemia - chronic P:  Monitor CBC DVT: heparin  Hold home iron  INFECTIOUS A:   N/V/D  P:   BCx2 3/27 >>  UC  3/27 >>  C-diff 3/27 >>   Vanco IV 3/27 >>  Cefepime 3/27 >>  Flagyl IV 3/27 >>  Vanco PO 3/28 (empiric) >>   Await C-diff PCR  Follow cultures as above  Trend PCT  ENDOCRINE A:   Diabetes Mellitus with Peripheral Neuropathy P:   SSI  Hold home metformin   NEUROLOGIC A:   Depression  Headaches P:   RASS goal: -1 PRN fentanyl / versed for sedation  Hold home meds - cymbalta, neurontin, requip, desyrel    FAMILY  - Updates: Husband and extended family updated 3/27.  Husband updated 3/28  - Inter-disciplinary family meet or Palliative Care meeting due by:  10/26/14  Independent CC time 35 minutes   Levy Pupa, MD, PhD 10/20/2014, 11:19 AM [facility] [phone] or if no answer [phone]

## 2014-10-20 NOTE — Progress Notes (Signed)
CRITICAL VALUE ALERT  Critical value received:  Troponin 11.22  Date of notification:  10/20/2014  Time of notification:2215  Critical value read back:Yes.    Nurse who received alert:  Jari FavreSteven M. Day Surgery Center LLChawRN  MD notified (1st page):  Elink  Time of first page:  2228  MD notified (2nd page):  Time of second page:  Responding MD:  Dr. Dellie CatholicSommers  Time MD responded: 2228

## 2014-10-20 NOTE — Progress Notes (Signed)
eLink Physician-Brief Progress Note Patient Name: Krista KalataMary O Mccleary DOB: September 02, 1945 MRN: [id number]   Date of Service  10/20/2014  HPI/Events of Note  Ca++ = 6.1 and albumin = 2.2. Ca++ corrects to 7.54.   eICU Interventions  Will replete Ca++.     Intervention Category Intermediate Interventions: Electrolyte abnormality - evaluation and management  Sommer,Steven Dennard Nipugene 10/20/2014, 4:57 PM
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
## 2014-10-21 ENCOUNTER — Inpatient Hospital Stay (HOSPITAL_COMMUNITY): Payer: Medicare Other

## 2014-10-21 LAB — GLUCOSE, CAPILLARY
GLUCOSE-CAPILLARY: 191 mg/dL — AB (ref 70–99)
GLUCOSE-CAPILLARY: 169 mg/dL — AB (ref 70–99)
GLUCOSE-CAPILLARY: 170 mg/dL — AB (ref 70–99)
Glucose-Capillary: 156 mg/dL — ABNORMAL HIGH (ref 70–99)

## 2014-10-21 LAB — BLOOD GAS, ARTERIAL
ACID-BASE EXCESS: 3.5 mmol/L — AB (ref 0.0–2.0)
Bicarbonate: 28.9 mEq/L — ABNORMAL HIGH (ref 20.0–24.0)
Drawn by: 418751
FIO2: 0.5 %
MECHVT: 480 mL
O2 Saturation: 91.3 %
PEEP: 8 cmH2O
Patient temperature: 98.6
RATE: 16 resp/min
TCO2: 30.6 mmol/L (ref 0–100)
pCO2 arterial: 55.7 mmHg — ABNORMAL HIGH (ref 35.0–45.0)
pH, Arterial: 7.335 — ABNORMAL LOW (ref 7.350–7.450)
pO2, Arterial: 71.6 mmHg — ABNORMAL LOW (ref 80.0–100.0)

## 2014-10-21 LAB — RENAL FUNCTION PANEL
ALBUMIN: 2.6 g/dL — AB (ref 3.5–5.2)
ANION GAP: 9 (ref 5–15)
Albumin: 2.4 g/dL — ABNORMAL LOW (ref 3.5–5.2)
Anion gap: 16 — ABNORMAL HIGH (ref 5–15)
BUN: 40 mg/dL — AB (ref 6–23)
BUN: 26 mg/dL — ABNORMAL HIGH (ref 6–23)
CO2: 28 mmol/L (ref 19–32)
CO2: 32 mmol/L (ref 19–32)
CREATININE: 3.1 mg/dL — AB (ref 0.50–1.10)
Calcium: 7.1 mg/dL — ABNORMAL LOW (ref 8.4–10.5)
Calcium: 7.4 mg/dL — ABNORMAL LOW (ref 8.4–10.5)
Chloride: 91 mmol/L — ABNORMAL LOW (ref 96–112)
Chloride: 95 mmol/L — ABNORMAL LOW (ref 96–112)
Creatinine, Ser: 3.74 mg/dL — ABNORMAL HIGH (ref 0.50–1.10)
GFR calc Af Amer: 14 mL/min — ABNORMAL LOW (ref >90)
GFR calc non Af Amer: 12 mL/min — ABNORMAL LOW (ref >90)
GFR calc non Af Amer: 15 mL/min — ABNORMAL LOW (ref >90)
GFR, EST AFRICAN AMERICAN: 17 mL/min — AB (ref >90)
GLUCOSE: 148 mg/dL — AB (ref 70–99)
Glucose, Bld: 202 mg/dL — ABNORMAL HIGH (ref 70–99)
PHOSPHORUS: 4.1 mg/dL (ref 2.3–4.6)
Phosphorus: 4.6 mg/dL (ref 2.3–4.6)
Potassium: 4.5 mmol/L (ref 3.5–5.1)
Potassium: 4.8 mmol/L (ref 3.5–5.1)
SODIUM: 136 mmol/L (ref 135–145)
Sodium: 135 mmol/L (ref 135–145)

## 2014-10-21 LAB — POCT ACTIVATED CLOTTING TIME
ACTIVATED CLOTTING TIME: 159 s
ACTIVATED CLOTTING TIME: 165 s
ACTIVATED CLOTTING TIME: 190 s
ACTIVATED CLOTTING TIME: 190 s
Activated Clotting Time: 153 seconds
Activated Clotting Time: 153 seconds
Activated Clotting Time: 159 seconds
Activated Clotting Time: 153 seconds
Activated Clotting Time: 159 seconds
Activated Clotting Time: 159 seconds
Activated Clotting Time: 171 seconds
Activated Clotting Time: 165 seconds
Activated Clotting Time: 171 seconds
Activated Clotting Time: 177 seconds
Activated Clotting Time: 177 seconds
Activated Clotting Time: 183 seconds
Activated Clotting Time: 183 seconds
Activated Clotting Time: 190 seconds
Activated Clotting Time: 190 seconds
Activated Clotting Time: 183 seconds
Activated Clotting Time: 190 seconds
Activated Clotting Time: 196 seconds
Activated Clotting Time: 202 seconds

## 2014-10-21 LAB — TROPONIN I
Troponin I: 8.25 ng/mL (ref <0.031)
Troponin I: 3.54 ng/mL (ref <0.031)

## 2014-10-21 LAB — CBC
HCT: 29.7 % — ABNORMAL LOW (ref 36.0–46.0)
HEMOGLOBIN: 9.3 g/dL — AB (ref 12.0–15.0)
MCH: 24.9 pg — AB (ref 26.0–34.0)
MCHC: 31.3 g/dL (ref 30.0–36.0)
MCV: 79.6 fL (ref 78.0–100.0)
PLATELETS: 276 10*3/uL (ref 150–400)
RBC: 3.73 MIL/uL — ABNORMAL LOW (ref 3.87–5.11)
RDW: 17.2 % — ABNORMAL HIGH (ref 11.5–15.5)
WBC: 12.1 10*3/uL — ABNORMAL HIGH (ref 4.0–10.5)

## 2014-10-21 LAB — MAGNESIUM: Magnesium: 2 mg/dL (ref 1.5–2.5)

## 2014-10-21 LAB — POCT I-STAT 3, ART BLOOD GAS (G3+)
ACID-BASE DEFICIT: 1 mmol/L (ref 0.0–2.0)
Bicarbonate: 24.1 mEq/L — ABNORMAL HIGH (ref 20.0–24.0)
O2 Saturation: 84 %
PCO2 ART: 41.5 mmHg (ref 35.0–45.0)
PO2 ART: 54 mmHg — AB (ref 80.0–100.0)
Patient temperature: 101.1
TCO2: 25 mmol/L (ref 0–100)
pH, Arterial: 7.379 (ref 7.350–7.450)

## 2014-10-21 LAB — VANCOMYCIN, RANDOM: Vancomycin Rm: 19.5 ug/mL

## 2014-10-21 LAB — LACTIC ACID, PLASMA
Lactic Acid, Venous: 8.6 mmol/L (ref 0.5–2.0)
Lactic Acid, Venous: 4.1 mmol/L (ref 0.5–2.0)

## 2014-10-21 LAB — URINE CULTURE
COLONY COUNT: NO GROWTH
Culture: NO GROWTH

## 2014-10-21 LAB — APTT: APTT: 87 s — AB (ref 24–37)

## 2014-10-21 LAB — CLOSTRIDIUM DIFFICILE BY PCR: Toxigenic C. Difficile by PCR: NEGATIVE

## 2014-10-21 LAB — PROCALCITONIN: PROCALCITONIN: 4.55 ng/mL

## 2014-10-21 MED ORDER — MORPHINE SULFATE 2 MG/ML IJ SOLN
1.0000 mg | Freq: Once | INTRAMUSCULAR | Status: AC
  Administered 2014-10-21: 1 mg via INTRAVENOUS
  Filled 2014-10-21: qty 1

## 2014-10-21 MED ORDER — FENTANYL CITRATE 0.05 MG/ML IJ SOLN
25.0000 ug | INTRAMUSCULAR | Status: DC | PRN
  Administered 2014-10-21 – 2014-10-22 (×6): 50 ug via INTRAVENOUS
  Filled 2014-10-21 (×5): qty 2

## 2014-10-21 NOTE — Progress Notes (Signed)
CRITICAL VALUE ALERT  Critical value received:  Lactate 4.1  Date of notification:  10/21/14  Time of notification:  0455  Critical value read back:Yes.    Nurse who received alert:  Holland Fallingebecca Amberli Ruegg RN  MD notified (1st page):  Elink called, Dr. Craige CottaSood  Time of first page:  n/a  MD notified (2nd page):  Time of second page:  Responding MD: n/a  Time MD responded:  n/a
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
## 2014-10-21 NOTE — Progress Notes (Signed)
Dr Delton CoombesByrum back at bedside.  Issues continue with inconsistent BPs and 02 sats.  Aline will be attempted.  Discussed continuing back pain.  Fentanyl will be administered.  Continuing to monitor.
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
## 2014-10-21 NOTE — Progress Notes (Signed)
PT Cancellation Note  Patient Details Name: Krista KalataMary O Hehr MRN: [id number] DOB: 1945-10-17   Cancelled Treatment:    Reason Eval/Treat Not Completed: Medical issues which prohibited therapy Pt extubated although remains on norepinephrine. Per departmental guidelines, will hold until patient able to tolerate lower dosage of norepinephrine.  Berton Mount[place]bour, Rosalynd Mcwright S 10/21/2014, 1:42 PM Sunday SpillersLogan Secor [place]rr[place], [place] [phone]

## 2014-10-21 NOTE — Progress Notes (Signed)
MD advised that pt's BP is inconsistent - have tried upper arms, forearms and calves.  02 sat. Is also inconsistent.  Pt is constant motion c/o back pain.  MD aware that repositioning and warm packs are being tried.  Do not want to administer pain medication since pt's 02 on her ABG was 49 and pt is now on venturi mask.  Levo remains at .

## 2014-10-21 NOTE — Progress Notes (Signed)
eLink Physician-Brief Progress Note Patient Name: Krista KalataMary O Wise DOB: January 17, 1946 MRN: [id number]   Date of Service  10/21/2014  HPI/Events of Note  Patient with persistent lower back pain, inaccurate saturation and BP readings, camera in showed patient is agitated and complaining of back pain  eICU Interventions  Morphine 1mg  IV x 1 dose Bipap trial Low threshold for reintubation.      Intervention Category Intermediate Interventions: Other:  Tasmin Exantus 10/21/2014, 7:08 PM
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
## 2014-10-21 NOTE — Progress Notes (Signed)
Aline attempted.  Unable to attain.

## 2014-10-21 NOTE — Procedures (Signed)
Extubation Procedure Note  Patient Details:   Name: Krista KalataMary O Cornman DOB: [birth year] MRN: [id number]   Airway Documentation:     Evaluation  O2 sats: stable throughout Complications: No apparent complications Patient did tolerate procedure well. Bilateral Breath Sounds: Clear, Diminished Suctioning: Airway Yes  Patient tolerated wean. MD ordered to extubate. Positive for cuff leak. Patient extubated to a 4 Lpm nasal cannula. No signs of dyspnea or stridor. Patient instructed on Incentive Spirometer achieving 600 mL five times. Family at bedside.   Ancil BoozerSmallwood, Denell Cothern 10/21/2014, 9:48 AM

## 2014-10-21 NOTE — Progress Notes (Signed)
PT Cancellation Note  Patient Details Name: Krista KalataMary O Wise MRN: [id number] DOB: [birth year]   Cancelled Treatment:    Reason Eval/Treat Not Completed: Medical issues which prohibited therapy Patient extubated this AM. Will follow up for comprehensive PT evaluation this afternoon as time allows.  Berton MountBarbour, Harlis Champoux S 10/21/2014, 10:29 AM Charlsie MerlesLogan Secor Laporche Martelle, PT [phone]

## 2014-10-22 ENCOUNTER — Inpatient Hospital Stay (HOSPITAL_COMMUNITY): Payer: Medicare Other

## 2014-10-22 LAB — RENAL FUNCTION PANEL
Albumin: 2.6 g/dL — ABNORMAL LOW (ref 3.5–5.2)
Albumin: 2.6 g/dL — ABNORMAL LOW (ref 3.5–5.2)
Anion gap: 10 (ref 5–15)
Anion gap: 11 (ref 5–15)
BUN: 17 mg/dL (ref 6–23)
BUN: 12 mg/dL (ref 6–23)
CO2: 30 mmol/L (ref 19–32)
CO2: 27 mmol/L (ref 19–32)
Calcium: 8.1 mg/dL — ABNORMAL LOW (ref 8.4–10.5)
Calcium: 8.3 mg/dL — ABNORMAL LOW (ref 8.4–10.5)
Chloride: 98 mmol/L (ref 96–112)
Chloride: 100 mmol/L (ref 96–112)
Creatinine, Ser: 2.34 mg/dL — ABNORMAL HIGH (ref 0.50–1.10)
Creatinine, Ser: 1.89 mg/dL — ABNORMAL HIGH (ref 0.50–1.10)
GFR calc Af Amer: 24 mL/min — ABNORMAL LOW (ref >90)
GFR calc Af Amer: 31 mL/min — ABNORMAL LOW
GFR calc non Af Amer: 27 mL/min — ABNORMAL LOW
GFR, EST NON AFRICAN AMERICAN: 21 mL/min — AB (ref >90)
GLUCOSE: 105 mg/dL — AB (ref 70–99)
Glucose, Bld: 77 mg/dL (ref 70–99)
Phosphorus: 3.3 mg/dL (ref 2.3–4.6)
Phosphorus: 2.7 mg/dL (ref 2.3–4.6)
Potassium: 4.5 mmol/L (ref 3.5–5.1)
Potassium: 4.6 mmol/L (ref 3.5–5.1)
SODIUM: 138 mmol/L (ref 135–145)
Sodium: 138 mmol/L (ref 135–145)

## 2014-10-22 LAB — BLOOD GAS, ARTERIAL
Acid-Base Excess: 3.2 mmol/L — ABNORMAL HIGH (ref 0.0–2.0)
Bicarbonate: 28.3 mEq/L — ABNORMAL HIGH (ref 20.0–24.0)
Delivery systems: POSITIVE
Drawn by: 313941
Expiratory PAP: 6
FIO2: 1 %
INSPIRATORY PAP: 12
MODE: POSITIVE
O2 SAT: 99.1 %
PH ART: 7.353 (ref 7.350–7.450)
Patient temperature: 98
TCO2: 30 mmol/L (ref 0–100)
pCO2 arterial: 52.1 mmHg — ABNORMAL HIGH (ref 35.0–45.0)
pO2, Arterial: 277 mmHg — ABNORMAL HIGH (ref 80.0–100.0)

## 2014-10-22 LAB — GLUCOSE, CAPILLARY
GLUCOSE-CAPILLARY: 147 mg/dL — AB (ref 70–99)
Glucose-Capillary: 107 mg/dL — ABNORMAL HIGH (ref 70–99)
Glucose-Capillary: 116 mg/dL — ABNORMAL HIGH (ref 70–99)
Glucose-Capillary: 124 mg/dL — ABNORMAL HIGH (ref 70–99)
Glucose-Capillary: 73 mg/dL (ref 70–99)
Glucose-Capillary: 78 mg/dL (ref 70–99)

## 2014-10-22 LAB — CBC
HCT: 28.9 % — ABNORMAL LOW (ref 36.0–46.0)
Hemoglobin: 8.9 g/dL — ABNORMAL LOW (ref 12.0–15.0)
MCH: 25.1 pg — ABNORMAL LOW (ref 26.0–34.0)
MCHC: 30.8 g/dL (ref 30.0–36.0)
MCV: 81.4 fL (ref 78.0–100.0)
Platelets: 231 10*3/uL (ref 150–400)
RBC: 3.55 MIL/uL — AB (ref 3.87–5.11)
RDW: 17 % — AB (ref 11.5–15.5)
WBC: 13 10*3/uL — ABNORMAL HIGH (ref 4.0–10.5)

## 2014-10-22 LAB — APTT
APTT: 189 s — AB (ref 24–37)
aPTT: >200 s (ref 24–37)
aPTT: 189 seconds — ABNORMAL HIGH (ref 24–37)

## 2014-10-22 LAB — FERRITIN: Ferritin: 834 ng/mL — ABNORMAL HIGH (ref 10–291)

## 2014-10-22 LAB — CULTURE, RESPIRATORY W GRAM STAIN

## 2014-10-22 LAB — MAGNESIUM: Magnesium: 2.3 mg/dL (ref 1.5–2.5)

## 2014-10-22 LAB — POCT ACTIVATED CLOTTING TIME
ACTIVATED CLOTTING TIME: 177 s
ACTIVATED CLOTTING TIME: 202 s
Activated Clotting Time: 196 seconds
Activated Clotting Time: 189 seconds
Activated Clotting Time: 190 seconds
Activated Clotting Time: 196 seconds
Activated Clotting Time: 190 seconds
Activated Clotting Time: 177 seconds
Activated Clotting Time: 183 seconds
Activated Clotting Time: 183 seconds
Activated Clotting Time: 171 seconds
Activated Clotting Time: 183 seconds
Activated Clotting Time: 196 seconds
Activated Clotting Time: 202 seconds

## 2014-10-22 LAB — CULTURE, RESPIRATORY

## 2014-10-22 MED ORDER — MORPHINE SULFATE 2 MG/ML IJ SOLN
1.0000 mg | Freq: Once | INTRAMUSCULAR | Status: AC
  Administered 2014-10-22: 1 mg via INTRAVENOUS
  Filled 2014-10-22: qty 1

## 2014-10-22 MED ORDER — ACETAMINOPHEN 10 MG/ML IV SOLN
1000.0000 mg | Freq: Once | INTRAVENOUS | Status: AC
  Administered 2014-10-22: 1000 mg via INTRAVENOUS
  Filled 2014-10-22: qty 100

## 2014-10-22 MED ORDER — ROPINIROLE HCL 1 MG PO TABS
2.0000 mg | ORAL_TABLET | Freq: Every day | ORAL | Status: DC
  Administered 2014-10-23 – 2014-11-13 (×21): 2 mg via ORAL
  Filled 2014-10-22 (×24): qty 2

## 2014-10-22 MED ORDER — ASPIRIN 325 MG PO TABS
325.0000 mg | ORAL_TABLET | Freq: Every day | ORAL | Status: DC
  Administered 2014-10-25 – 2014-11-09 (×15): 325 mg via ORAL
  Filled 2014-10-22 (×19): qty 1

## 2014-10-22 MED ORDER — DULOXETINE HCL 60 MG PO CPEP
60.0000 mg | ORAL_CAPSULE | Freq: Two times a day (BID) | ORAL | Status: DC
  Administered 2014-10-25 – 2014-11-14 (×38): 60 mg via ORAL
  Filled 2014-10-22 (×48): qty 1
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
## 2014-10-22 NOTE — Progress Notes (Signed)
Spoke with Dr. Dema SeverinMungal concerning the pt's c/o pain.  One time dose of IV acetaminophen being ordered.  Called pharmacy to send stat.

## 2014-10-22 NOTE — Progress Notes (Signed)
[facility] Requesting a pain medication for the patient.  Pt is writhing in pain in the bed.  Moaning and not focused on commands or questions.  Pt looks similar to yesterday when she had c/o chronic back pain.  Explained that I did not feel the pt was capable of swallowing pills due to incoherent state.  Would like to avoid any type of narcotics related to the suspicion that her paranoid and confused state was related to the fentanyl that she received.   Suppository would most likely not be effective, as the pt has just had her fourth stool.  Requested IV tylenol to be considered.

## 2014-10-22 NOTE — Progress Notes (Signed)
Notified Dr. Delton CoombesByrum that pt is now paranoid and agitated.  Suspecting a side effect to fentanyl.  IV fentanyl being discontinued.  Will continue to monitor.

## 2014-10-22 NOTE — Progress Notes (Signed)
eLink Physician-Brief Progress Note Patient Name: Theodoro KalataMary O Cid DOB: 1945-11-17 MRN: [id number]   Date of Service  10/22/2014  HPI/Events of Note  Daughter present in the room, request brief update on clinical status  eICU Interventions  Daughter updated on current treatment and plan.      Intervention Category Minor Interventions: Other:  Emilynn Srinivasan 10/22/2014, 4:41 PM
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
## 2014-10-22 NOTE — Progress Notes (Signed)
PT Cancellation Note  Patient Details Name: Krista Wise MRN: [id number] DOB: 05-28-1946   Cancelled Treatment:    Reason Eval/Treat Not Completed: Medical issues which prohibited therapy Spoke with RN who requests PT be held this AM. Reports increased agitation and difficulty with breathing. Will attempt tomorrow as patient is able.  Berton MountBarbour, Kasyn Rolph S 10/22/2014, 11:52 AM Charlsie MerlesLogan Secor Abigail Teall, PT [phone]

## 2014-10-22 NOTE — Progress Notes (Signed)
Notified pharmacy of the need for a higher limit for the heparin per CRRT.  Maximum dose to be increased to 3500 u/hr.  Will need to call pharmacy if pt's requirement exceeds this level.

## 2014-10-23 ENCOUNTER — Inpatient Hospital Stay (HOSPITAL_COMMUNITY): Payer: Medicare Other

## 2014-10-23 ENCOUNTER — Inpatient Hospital Stay (HOSPITAL_COMMUNITY): Admission: RE | Admit: 2014-10-23 | Payer: Medicare Other | Source: Ambulatory Visit

## 2014-10-23 LAB — RENAL FUNCTION PANEL
ALBUMIN: 2.5 g/dL — AB (ref 3.5–5.2)
ANION GAP: 13 (ref 5–15)
Albumin: 2.6 g/dL — ABNORMAL LOW (ref 3.5–5.2)
Anion gap: 13 (ref 5–15)
BUN: 13 mg/dL (ref 6–23)
BUN: 13 mg/dL (ref 6–23)
CHLORIDE: 99 mmol/L (ref 96–112)
CO2: 23 mmol/L (ref 19–32)
CO2: 25 mmol/L (ref 19–32)
Calcium: 8.3 mg/dL — ABNORMAL LOW (ref 8.4–10.5)
Calcium: 8.5 mg/dL (ref 8.4–10.5)
Chloride: 99 mmol/L (ref 96–112)
Creatinine, Ser: 1.82 mg/dL — ABNORMAL HIGH (ref 0.50–1.10)
Creatinine, Ser: 1.64 mg/dL — ABNORMAL HIGH (ref 0.50–1.10)
GFR calc Af Amer: 32 mL/min — ABNORMAL LOW (ref >90)
GFR calc non Af Amer: 28 mL/min — ABNORMAL LOW (ref >90)
GFR, EST AFRICAN AMERICAN: 37 mL/min — AB (ref >90)
GFR, EST NON AFRICAN AMERICAN: 32 mL/min — AB (ref >90)
GLUCOSE: 130 mg/dL — AB (ref 70–99)
Glucose, Bld: 141 mg/dL — ABNORMAL HIGH (ref 70–99)
PHOSPHORUS: 2.9 mg/dL (ref 2.3–4.6)
POTASSIUM: 4.8 mmol/L (ref 3.5–5.1)
Phosphorus: 2.7 mg/dL (ref 2.3–4.6)
Potassium: 4.9 mmol/L (ref 3.5–5.1)
SODIUM: 137 mmol/L (ref 135–145)
Sodium: 135 mmol/L (ref 135–145)

## 2014-10-23 LAB — POCT I-STAT 3, ART BLOOD GAS (G3+)
Acid-base deficit: 1 mmol/L (ref 0.0–2.0)
Bicarbonate: 25.2 mEq/L — ABNORMAL HIGH (ref 20.0–24.0)
O2 SAT: 100 %
PCO2 ART: 42.9 mmHg (ref 35.0–45.0)
PH ART: 7.371 (ref 7.350–7.450)
PO2 ART: 412 mmHg — AB (ref 80.0–100.0)
Patient temperature: 96.3
TCO2: 27 mmol/L (ref 0–100)

## 2014-10-23 LAB — GLUCOSE, CAPILLARY
GLUCOSE-CAPILLARY: 116 mg/dL — AB (ref 70–99)
GLUCOSE-CAPILLARY: 154 mg/dL — AB (ref 70–99)
Glucose-Capillary: 125 mg/dL — ABNORMAL HIGH (ref 70–99)
Glucose-Capillary: 127 mg/dL — ABNORMAL HIGH (ref 70–99)
Glucose-Capillary: 128 mg/dL — ABNORMAL HIGH (ref 70–99)
Glucose-Capillary: 93 mg/dL (ref 70–99)

## 2014-10-23 LAB — CBC
HCT: 27.9 % — ABNORMAL LOW (ref 36.0–46.0)
Hemoglobin: 8.4 g/dL — ABNORMAL LOW (ref 12.0–15.0)
MCH: 24.9 pg — ABNORMAL LOW (ref 26.0–34.0)
MCHC: 30.1 g/dL (ref 30.0–36.0)
MCV: 82.8 fL (ref 78.0–100.0)
PLATELETS: 227 10*3/uL (ref 150–400)
RBC: 3.37 MIL/uL — ABNORMAL LOW (ref 3.87–5.11)
RDW: 17.4 % — AB (ref 11.5–15.5)
WBC: 13.5 10*3/uL — AB (ref 4.0–10.5)

## 2014-10-23 LAB — POCT ACTIVATED CLOTTING TIME
ACTIVATED CLOTTING TIME: 171 s
ACTIVATED CLOTTING TIME: 171 s
ACTIVATED CLOTTING TIME: 177 s
ACTIVATED CLOTTING TIME: 183 s
Activated Clotting Time: 171 seconds
Activated Clotting Time: 177 seconds
Activated Clotting Time: 183 seconds
Activated Clotting Time: 159 seconds
Activated Clotting Time: 171 seconds
Activated Clotting Time: 177 seconds
Activated Clotting Time: 171 seconds
Activated Clotting Time: 171 seconds

## 2014-10-23 LAB — IRON AND TIBC
Iron: 42 ug/dL (ref 42–145)
Saturation Ratios: 19 % — ABNORMAL LOW (ref 20–55)
TIBC: 223 ug/dL — ABNORMAL LOW (ref 250–470)
UIBC: 181 ug/dL (ref 125–400)

## 2014-10-23 LAB — APTT
aPTT: 122 seconds — ABNORMAL HIGH (ref 24–37)
aPTT: 114 seconds — ABNORMAL HIGH (ref 24–37)

## 2014-10-23 LAB — NOROVIRUS GROUP 1 & 2 BY PCR, STOOL
NOROVIRUS 2 BY PCR: NEGATIVE
Norovirus 1 by PCR: NEGATIVE

## 2014-10-23 LAB — HEPARIN LEVEL (UNFRACTIONATED): HEPARIN UNFRACTIONATED: 0.53 [IU]/mL (ref 0.30–0.70)

## 2014-10-23 LAB — MAGNESIUM: Magnesium: 2.6 mg/dL — ABNORMAL HIGH (ref 1.5–2.5)

## 2014-10-23 MED ORDER — HALOPERIDOL LACTATE 5 MG/ML IJ SOLN
5.0000 mg | Freq: Four times a day (QID) | INTRAMUSCULAR | Status: DC | PRN
  Administered 2014-10-23 (×2): 2.5 mg via INTRAVENOUS
  Filled 2014-10-23 (×3): qty 1

## 2014-10-23 MED ORDER — DEXMEDETOMIDINE HCL IN NACL 200 MCG/50ML IV SOLN
0.4000 ug/kg/h | INTRAVENOUS | Status: DC
  Administered 2014-10-23: 0.9 ug/kg/h via INTRAVENOUS
  Administered 2014-10-23: 0.6 ug/kg/h via INTRAVENOUS
  Administered 2014-10-23: 0.4 ug/kg/h via INTRAVENOUS
  Filled 2014-10-23 (×3): qty 50

## 2014-10-23 MED ORDER — SILVER SULFADIAZINE 1 % EX CREA: 1.0000 "application " | TOPICAL_CREAM | Freq: Every day | CUTANEOUS | Status: DC

## 2014-10-23 MED ORDER — HALOPERIDOL LACTATE 5 MG/ML IJ SOLN
5.0000 mg | Freq: Once | INTRAMUSCULAR | Status: AC
  Administered 2014-10-23: 5 mg via INTRAVENOUS

## 2014-10-23 MED ORDER — HALOPERIDOL LACTATE 5 MG/ML IJ SOLN: 5.0000 mg | Freq: Four times a day (QID) | INTRAMUSCULAR | Status: DC | PRN

## 2014-10-23 NOTE — Progress Notes (Signed)
Pt has been becoming increasingly agitated as the afternoon has progressed.  Bathed pt and then gave a half of the dose of ordered haldol at 1530.  This seemed to have no effect after one half hour.  At 1600, administered the remaining 2.5mg  IV.  Agitation is worsening.  Two of her sisters are at the bedside.  Sisters are unable to calm her.  May have to place her back in restraints, as she is frequently reaching up to her HD cath with her right hand.  Mitt is now on hand.  Called to E-link nurse, Leta JunglingMarcia, and requested for her to camera in to evaluate pt.
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
## 2014-10-23 NOTE — Progress Notes (Signed)
Pharmacy Medication related progress note  Patient requiring very high amounts of heparin for CRRT. Currently patient is receiving 5050 units/hr through machine. Random heparin level this evening was 0.53 which is within therapeutic range if treating with systemic heparin. Aptt was also elevated at 114. Last ACT was 171 (below goal)  D/w Dr.Powell on renal and feel with therapeutic anticoagulation up titration of heparin through crrt machine is not warranted and he agreed. Nurse to discuss new goals for for CRRT machine with Renal.  Sheppard CoilFrank Wilson PharmD., BCPS Clinical Pharmacist Pager [phone] 10/23/2014 10:27 PM
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
## 2014-10-23 NOTE — Progress Notes (Signed)
Received prescreen request for inpatient rehab from PT and have reviewed pt's case. I noted that pt's participation was limited during PT evaluation to sitting up in bed with bed in chair position. I would like to follow pt's progress with therapy and recommend that OT evaluation also be ordered.  Thanks.  Juliann MuleJanine Renna Kilmer, PT Rehabilitation Admissions Coordinator [phone]

## 2014-10-23 NOTE — Progress Notes (Signed)
Spoke with Pharmacy.  Advised them the heparin via the CRRT was at the maximum dose.  Aptt and heparin level to be drawn at 2000 to evaluate systemic levels.  Pharmacy will notify nursing what to do concerning the heparin infusion after the lab results are reviewed.

## 2014-10-23 NOTE — Progress Notes (Signed)
Requested Elink to camera in to observe increasing agitation.  Some one will be coming to evaluate her at the bedside.

## 2014-10-23 NOTE — Progress Notes (Signed)
eLink Physician-Brief Progress Note Patient Name: Krista KalataMary O Wise DOB: February 25, 1946 MRN: [id number]   Date of Service  10/23/2014  HPI/Events of Note  Patient is delirious/anxious.   eICU Interventions  Start Precedex IV infusion and titrate to RASS = 0. Watch respiratory status closely.      Intervention Category Major Interventions: Delirium, psychosis, severe agitation - evaluation and management Minor Interventions: Communication with other healthcare providers and/or family  Lenell AntuSommer,Steven Eugene 10/23/2014, 12:16 AM

## 2014-10-23 NOTE — Progress Notes (Signed)
Placed a follow-up call to Elink to check on status as to when some one can evaluate pt.

## 2014-10-23 NOTE — Progress Notes (Signed)
Pt taken off BIPAP by RN and placed on 6L [employer]. Pt tolerating well at this time. RT will continue to monitor.

## 2014-10-23 NOTE — Progress Notes (Signed)
Utilization review completed.  
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
## 2014-10-23 NOTE — Progress Notes (Signed)
Sisters at bedside when pt placed in restraints.  Education given to sisters.  Explained the need for the restraints and the plan to progress out.  Pt is not able to be educated at this time related to LOC.

## 2014-10-24 ENCOUNTER — Inpatient Hospital Stay (HOSPITAL_COMMUNITY): Payer: Medicare Other

## 2014-10-24 LAB — GLUCOSE, CAPILLARY
GLUCOSE-CAPILLARY: 107 mg/dL — AB (ref 70–99)
GLUCOSE-CAPILLARY: 180 mg/dL — AB (ref 70–99)
GLUCOSE-CAPILLARY: 130 mg/dL — AB (ref 70–99)
GLUCOSE-CAPILLARY: 124 mg/dL — AB (ref 70–99)
Glucose-Capillary: 110 mg/dL — ABNORMAL HIGH (ref 70–99)
Glucose-Capillary: 123 mg/dL — ABNORMAL HIGH (ref 70–99)
Glucose-Capillary: 128 mg/dL — ABNORMAL HIGH (ref 70–99)

## 2014-10-24 LAB — POCT ACTIVATED CLOTTING TIME
ACTIVATED CLOTTING TIME: 171 s
ACTIVATED CLOTTING TIME: 183 s
ACTIVATED CLOTTING TIME: 159 s
ACTIVATED CLOTTING TIME: 196 s
ACTIVATED CLOTTING TIME: 202 s
ACTIVATED CLOTTING TIME: 196 s
Activated Clotting Time: 159 seconds
Activated Clotting Time: 171 seconds
Activated Clotting Time: 171 seconds
Activated Clotting Time: 177 seconds
Activated Clotting Time: 177 seconds
Activated Clotting Time: 177 seconds
Activated Clotting Time: 189 seconds
Activated Clotting Time: 190 seconds
Activated Clotting Time: 190 seconds
Activated Clotting Time: 188 seconds
Activated Clotting Time: 196 seconds
Activated Clotting Time: 208 seconds
Activated Clotting Time: 227 seconds

## 2014-10-24 LAB — CBC
HCT: 26.2 % — ABNORMAL LOW (ref 36.0–46.0)
Hemoglobin: 8 g/dL — ABNORMAL LOW (ref 12.0–15.0)
MCH: 25 pg — ABNORMAL LOW (ref 26.0–34.0)
MCHC: 30.5 g/dL (ref 30.0–36.0)
MCV: 81.9 fL (ref 78.0–100.0)
PLATELETS: 215 10*3/uL (ref 150–400)
RBC: 3.2 MIL/uL — AB (ref 3.87–5.11)
RDW: 17.4 % — ABNORMAL HIGH (ref 11.5–15.5)
WBC: 12.3 10*3/uL — AB (ref 4.0–10.5)

## 2014-10-24 LAB — RENAL FUNCTION PANEL
ANION GAP: 8 (ref 5–15)
Albumin: 2.7 g/dL — ABNORMAL LOW (ref 3.5–5.2)
Albumin: 2.6 g/dL — ABNORMAL LOW (ref 3.5–5.2)
Anion gap: 13 (ref 5–15)
BUN: 15 mg/dL (ref 6–23)
BUN: 16 mg/dL (ref 6–23)
CHLORIDE: 103 mmol/L (ref 96–112)
CO2: 27 mmol/L (ref 19–32)
CO2: 24 mmol/L (ref 19–32)
CREATININE: 1.61 mg/dL — AB (ref 0.50–1.10)
Calcium: 8.5 mg/dL (ref 8.4–10.5)
Calcium: 8.5 mg/dL (ref 8.4–10.5)
Chloride: 100 mmol/L (ref 96–112)
Creatinine, Ser: 1.52 mg/dL — ABNORMAL HIGH (ref 0.50–1.10)
GFR calc Af Amer: 40 mL/min — ABNORMAL LOW (ref >90)
GFR calc Af Amer: 37 mL/min — ABNORMAL LOW (ref >90)
GFR calc non Af Amer: 35 mL/min — ABNORMAL LOW (ref >90)
GFR, EST NON AFRICAN AMERICAN: 32 mL/min — AB (ref >90)
GLUCOSE: 106 mg/dL — AB (ref 70–99)
Glucose, Bld: 146 mg/dL — ABNORMAL HIGH (ref 70–99)
PHOSPHORUS: 1.9 mg/dL — AB (ref 2.3–4.6)
POTASSIUM: 4.8 mmol/L (ref 3.5–5.1)
Phosphorus: 1.9 mg/dL — ABNORMAL LOW (ref 2.3–4.6)
Potassium: 4.6 mmol/L (ref 3.5–5.1)
Sodium: 138 mmol/L (ref 135–145)
Sodium: 137 mmol/L (ref 135–145)

## 2014-10-24 LAB — POCT I-STAT 3, ART BLOOD GAS (G3+)
ACID-BASE DEFICIT: 2 mmol/L (ref 0.0–2.0)
Bicarbonate: 21.7 mEq/L (ref 20.0–24.0)
O2 Saturation: 94 %
TCO2: 23 mmol/L (ref 0–100)
pCO2 arterial: 34.6 mmHg — ABNORMAL LOW (ref 35.0–45.0)
pH, Arterial: 7.408 (ref 7.350–7.450)
pO2, Arterial: 72 mmHg — ABNORMAL LOW (ref 80.0–100.0)

## 2014-10-24 LAB — POCT I-STAT EG7
ACID-BASE EXCESS: 2 mmol/L (ref 0.0–2.0)
BICARBONATE: 27.7 meq/L — AB (ref 20.0–24.0)
Calcium, Ion: 1.18 mmol/L (ref 1.13–1.30)
HCT: 27 % — ABNORMAL LOW (ref 36.0–46.0)
Hemoglobin: 9.2 g/dL — ABNORMAL LOW (ref 12.0–15.0)
O2 SAT: 38 %
Potassium: 4.4 mmol/L (ref 3.5–5.1)
Sodium: 137 mmol/L (ref 135–145)
TCO2: 29 mmol/L (ref 0–100)
pCO2, Ven: 50.7 mmHg — ABNORMAL HIGH (ref 45.0–50.0)
pH, Ven: 7.346 — ABNORMAL HIGH (ref 7.250–7.300)
pO2, Ven: 24 mmHg — CL (ref 30.0–45.0)

## 2014-10-24 LAB — APTT: aPTT: 159 seconds — ABNORMAL HIGH (ref 24–37)

## 2014-10-24 LAB — MAGNESIUM: Magnesium: 2.5 mg/dL (ref 1.5–2.5)

## 2014-10-24 MED ORDER — SODIUM CHLORIDE 0.9 % IV SOLN
INTRAVENOUS | Status: DC
  Administered 2014-10-26: 10 mL/h via INTRAVENOUS

## 2014-10-24 NOTE — Progress Notes (Signed)
Pt taken off bipap at this time and placed on 3lpm Met[place], no distress at this time.

## 2014-10-24 NOTE — Plan of Care (Signed)
Problem: Phase II Progression Outcomes Goal: Vital signs remain stable Outcome: Completed/Met Date Met:  10/24/14 Pt weaned off levophed gtt 4/1
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
## 2014-10-24 NOTE — Progress Notes (Signed)
Orthopedic Tech Progress Note Patient Details:  Krista KalataMary O Wise 08-23-1945 [id number]  Ortho Devices Type of Ortho Device: Postop shoe/boot Ortho Device/Splint Location: Bil. Prafo Ortho Device/Splint Interventions: Application   [name] 10/24/2014, 1:52 PM

## 2014-10-24 NOTE — Progress Notes (Signed)
Pt had been placed on nasal cannula at 1430.  Pt remains drowsy.  Performed follow-up ABG to ensure drowsiness was not related to elevated C02 level.  ABG was wnl.  Elink notified.
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
## 2014-10-25 LAB — CULTURE, BLOOD (ROUTINE X 2)
CULTURE: NO GROWTH
Culture: NO GROWTH

## 2014-10-25 LAB — RENAL FUNCTION PANEL
ANION GAP: 12 (ref 5–15)
Albumin: 2.7 g/dL — ABNORMAL LOW (ref 3.5–5.2)
Albumin: 2.6 g/dL — ABNORMAL LOW (ref 3.5–5.2)
Anion gap: 6 (ref 5–15)
BUN: 14 mg/dL (ref 6–23)
BUN: 15 mg/dL (ref 6–23)
CALCIUM: 8.2 mg/dL — AB (ref 8.4–10.5)
CHLORIDE: 104 mmol/L (ref 96–112)
CO2: 27 mmol/L (ref 19–32)
CO2: 24 mmol/L (ref 19–32)
CREATININE: 1.57 mg/dL — AB (ref 0.50–1.10)
Calcium: 8.4 mg/dL (ref 8.4–10.5)
Chloride: 99 mmol/L (ref 96–112)
Creatinine, Ser: 1.56 mg/dL — ABNORMAL HIGH (ref 0.50–1.10)
GFR calc Af Amer: 39 mL/min — ABNORMAL LOW (ref >90)
GFR calc Af Amer: 39 mL/min — ABNORMAL LOW (ref >90)
GFR calc non Af Amer: 33 mL/min — ABNORMAL LOW (ref >90)
GFR, EST NON AFRICAN AMERICAN: 34 mL/min — AB (ref >90)
GLUCOSE: 124 mg/dL — AB (ref 70–99)
GLUCOSE: 164 mg/dL — AB (ref 70–99)
POTASSIUM: 4.2 mmol/L (ref 3.5–5.1)
Phosphorus: 1.5 mg/dL — ABNORMAL LOW (ref 2.3–4.6)
Phosphorus: 2.2 mg/dL — ABNORMAL LOW (ref 2.3–4.6)
Potassium: 4.4 mmol/L (ref 3.5–5.1)
SODIUM: 135 mmol/L (ref 135–145)
Sodium: 137 mmol/L (ref 135–145)

## 2014-10-25 LAB — GLUCOSE, CAPILLARY
GLUCOSE-CAPILLARY: 106 mg/dL — AB (ref 70–99)
GLUCOSE-CAPILLARY: 118 mg/dL — AB (ref 70–99)
GLUCOSE-CAPILLARY: 123 mg/dL — AB (ref 70–99)
GLUCOSE-CAPILLARY: 96 mg/dL (ref 70–99)
Glucose-Capillary: 128 mg/dL — ABNORMAL HIGH (ref 70–99)
Glucose-Capillary: 152 mg/dL — ABNORMAL HIGH (ref 70–99)

## 2014-10-25 LAB — POCT ACTIVATED CLOTTING TIME
ACTIVATED CLOTTING TIME: 183 s
ACTIVATED CLOTTING TIME: 196 s
ACTIVATED CLOTTING TIME: 202 s
ACTIVATED CLOTTING TIME: 214 s
Activated Clotting Time: 190 seconds
Activated Clotting Time: 208 seconds
Activated Clotting Time: 208 seconds

## 2014-10-25 LAB — CBC
HCT: 27.2 % — ABNORMAL LOW (ref 36.0–46.0)
Hemoglobin: 8.3 g/dL — ABNORMAL LOW (ref 12.0–15.0)
MCH: 25 pg — ABNORMAL LOW (ref 26.0–34.0)
MCHC: 30.5 g/dL (ref 30.0–36.0)
MCV: 81.9 fL (ref 78.0–100.0)
Platelets: 210 10*3/uL (ref 150–400)
RBC: 3.32 MIL/uL — ABNORMAL LOW (ref 3.87–5.11)
RDW: 17.8 % — ABNORMAL HIGH (ref 11.5–15.5)
WBC: 12.3 10*3/uL — AB (ref 4.0–10.5)

## 2014-10-25 LAB — HEPARIN LEVEL (UNFRACTIONATED)
HEPARIN UNFRACTIONATED: 1.48 [IU]/mL — AB (ref 0.30–0.70)
Heparin Unfractionated: 1.12 IU/mL — ABNORMAL HIGH (ref 0.30–0.70)

## 2014-10-25 LAB — MAGNESIUM: Magnesium: 2.5 mg/dL (ref 1.5–2.5)

## 2014-10-25 LAB — APTT
aPTT: 199 seconds — ABNORMAL HIGH (ref 24–37)
aPTT: >200 seconds (ref 24–37)

## 2014-10-25 MED ORDER — SODIUM PHOSPHATE 3 MMOLE/ML IV SOLN
10.0000 mmol | Freq: Once | INTRAVENOUS | Status: AC
  Administered 2014-10-25: 10 mmol via INTRAVENOUS
  Filled 2014-10-25: qty 3.33

## 2014-10-25 MED ORDER — FAMOTIDINE 20 MG PO TABS
20.0000 mg | ORAL_TABLET | Freq: Every day | ORAL | Status: DC
  Administered 2014-10-25 – 2014-11-07 (×14): 20 mg via ORAL
  Filled 2014-10-25 (×16): qty 1

## 2014-10-25 NOTE — Progress Notes (Signed)
PTT reported >200. Dr Antoine PocheGoldsbrough notified orders to repeat labs received. Will continue to monitor
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
## 2014-10-25 NOTE — Progress Notes (Signed)
Dr. Eliott Nineunham paged with the results of 1600 heparin level.  Heparin level = 1.48, an increase from 1.12 at 0700.  She gave orders to drop the rate to 4,000 units per hour from 5,500 units per hour via CRRT heparin syringe pump.  Continue to monitor ACTs per order set.  Once filter clots, do not replace and pause CRRT treatment until next MD round or page if filter clots during day on 10/26/14.

## 2014-10-25 NOTE — Plan of Care (Signed)
Problem: Phase I Progression Outcomes Goal: OOB as tolerated unless otherwise ordered Outcome: Not Met (add Reason) Patient too unstable, on CRRT.     

## 2014-10-26 ENCOUNTER — Inpatient Hospital Stay (HOSPITAL_COMMUNITY): Payer: Medicare Other

## 2014-10-26 DIAGNOSIS — I5031 Acute diastolic (congestive) heart failure: Secondary | ICD-10-CM

## 2014-10-26 DIAGNOSIS — I5043 Acute on chronic combined systolic (congestive) and diastolic (congestive) heart failure: Secondary | ICD-10-CM | POA: Diagnosis present

## 2014-10-26 LAB — RENAL FUNCTION PANEL
Albumin: 2.8 g/dL — ABNORMAL LOW (ref 3.5–5.2)
Albumin: 2.7 g/dL — ABNORMAL LOW (ref 3.5–5.2)
Anion gap: 10 (ref 5–15)
Anion gap: 8 (ref 5–15)
BUN: 11 mg/dL (ref 6–23)
BUN: 9 mg/dL (ref 6–23)
CO2: 26 mmol/L (ref 19–32)
CO2: 27 mmol/L (ref 19–32)
Calcium: 8.5 mg/dL (ref 8.4–10.5)
Calcium: 8.5 mg/dL (ref 8.4–10.5)
Chloride: 102 mmol/L (ref 96–112)
Chloride: 102 mmol/L (ref 96–112)
Creatinine, Ser: 1.42 mg/dL — ABNORMAL HIGH (ref 0.50–1.10)
Creatinine, Ser: 1.32 mg/dL — ABNORMAL HIGH (ref 0.50–1.10)
GFR calc Af Amer: 48 mL/min — ABNORMAL LOW (ref >90)
GFR calc non Af Amer: 38 mL/min — ABNORMAL LOW (ref >90)
GFR calc non Af Amer: 41 mL/min — ABNORMAL LOW (ref >90)
GFR, EST AFRICAN AMERICAN: 44 mL/min — AB (ref >90)
GLUCOSE: 150 mg/dL — AB (ref 70–99)
Glucose, Bld: 145 mg/dL — ABNORMAL HIGH (ref 70–99)
PHOSPHORUS: 1.7 mg/dL — AB (ref 2.3–4.6)
POTASSIUM: 4.5 mmol/L (ref 3.5–5.1)
Phosphorus: 1.8 mg/dL — ABNORMAL LOW (ref 2.3–4.6)
Potassium: 3.9 mmol/L (ref 3.5–5.1)
SODIUM: 138 mmol/L (ref 135–145)
Sodium: 137 mmol/L (ref 135–145)

## 2014-10-26 LAB — CBC
HCT: 29.2 % — ABNORMAL LOW (ref 36.0–46.0)
Hemoglobin: 8.7 g/dL — ABNORMAL LOW (ref 12.0–15.0)
MCH: 24.7 pg — AB (ref 26.0–34.0)
MCHC: 29.8 g/dL — ABNORMAL LOW (ref 30.0–36.0)
MCV: 83 fL (ref 78.0–100.0)
PLATELETS: 204 10*3/uL (ref 150–400)
RBC: 3.52 MIL/uL — AB (ref 3.87–5.11)
RDW: 18 % — AB (ref 11.5–15.5)
WBC: 14.3 10*3/uL — ABNORMAL HIGH (ref 4.0–10.5)

## 2014-10-26 LAB — GLUCOSE, CAPILLARY
GLUCOSE-CAPILLARY: 135 mg/dL — AB (ref 70–99)
GLUCOSE-CAPILLARY: 151 mg/dL — AB (ref 70–99)
Glucose-Capillary: 110 mg/dL — ABNORMAL HIGH (ref 70–99)
Glucose-Capillary: 129 mg/dL — ABNORMAL HIGH (ref 70–99)
Glucose-Capillary: 111 mg/dL — ABNORMAL HIGH (ref 70–99)
Glucose-Capillary: 147 mg/dL — ABNORMAL HIGH (ref 70–99)
Glucose-Capillary: 119 mg/dL — ABNORMAL HIGH (ref 70–99)

## 2014-10-26 LAB — POCT ACTIVATED CLOTTING TIME
ACTIVATED CLOTTING TIME: 183 s
ACTIVATED CLOTTING TIME: 184 s
Activated Clotting Time: 183 seconds
Activated Clotting Time: 190 seconds
Activated Clotting Time: 177 seconds
Activated Clotting Time: 183 seconds
Activated Clotting Time: 177 seconds
Activated Clotting Time: 190 seconds

## 2014-10-26 LAB — APTT: APTT: 159 s — AB (ref 24–37)

## 2014-10-26 LAB — HEPARIN LEVEL (UNFRACTIONATED): HEPARIN UNFRACTIONATED: 0.73 [IU]/mL — AB (ref 0.30–0.70)

## 2014-10-26 LAB — MAGNESIUM: Magnesium: 2.6 mg/dL — ABNORMAL HIGH (ref 1.5–2.5)

## 2014-10-26 MED ORDER — SODIUM PHOSPHATE 3 MMOLE/ML IV SOLN
10.0000 mmol | Freq: Once | INTRAVENOUS | Status: DC
  Administered 2014-10-26: 10 mmol via INTRAVENOUS
  Filled 2014-10-26: qty 3.33
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
## 2014-10-26 NOTE — Progress Notes (Deleted)
This note also relates to the following rows which could not be included: Temp - Cannot attach notes to unvalidated device data BP - Cannot attach notes to unvalidated device data MAP (mmHg) - Cannot attach notes to unvalidated device data Pulse Rate - Cannot attach notes to unvalidated device data ECG Heart Rate - Cannot attach notes to unvalidated device data Resp - Cannot attach notes to unvalidated device data SpO2 - Cannot attach notes to unvalidated device data CVP (mmHg) - Cannot attach notes to unvalidated device data
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
## 2014-10-26 NOTE — Progress Notes (Deleted)
eLink Physician-Brief Progress Note Patient Name: Krista KalataMary O Wise DOB: 05/23/1946 MRN: [id number]   Date of Service  10/26/2014  HPI/Events of Note  Ph low  eICU Interventions  Will give Na Phos IV     Intervention Category Major Interventions: Other:  Krista Wise 10/26/2014, 6:14 AM

## 2014-10-27 ENCOUNTER — Inpatient Hospital Stay (HOSPITAL_COMMUNITY): Payer: Medicare Other

## 2014-10-27 DIAGNOSIS — R6521 Severe sepsis with septic shock: Secondary | ICD-10-CM

## 2014-10-27 DIAGNOSIS — T25022A Burn of unspecified degree of left foot, initial encounter: Secondary | ICD-10-CM | POA: Diagnosis present

## 2014-10-27 DIAGNOSIS — J9600 Acute respiratory failure, unspecified whether with hypoxia or hypercapnia: Secondary | ICD-10-CM | POA: Diagnosis present

## 2014-10-27 DIAGNOSIS — A419 Sepsis, unspecified organism: Secondary | ICD-10-CM | POA: Diagnosis present

## 2014-10-27 LAB — RENAL FUNCTION PANEL
Albumin: 2.5 g/dL — ABNORMAL LOW (ref 3.5–5.2)
Anion gap: 11 (ref 5–15)
BUN: 14 mg/dL (ref 6–23)
CALCIUM: 8.4 mg/dL (ref 8.4–10.5)
CO2: 23 mmol/L (ref 19–32)
Chloride: 102 mmol/L (ref 96–112)
Creatinine, Ser: 1.83 mg/dL — ABNORMAL HIGH (ref 0.50–1.10)
GFR calc Af Amer: 32 mL/min — ABNORMAL LOW (ref >90)
GFR calc non Af Amer: 28 mL/min — ABNORMAL LOW (ref >90)
GLUCOSE: 136 mg/dL — AB (ref 70–99)
Phosphorus: 1.9 mg/dL — ABNORMAL LOW (ref 2.3–4.6)
Potassium: 3.9 mmol/L (ref 3.5–5.1)
Sodium: 136 mmol/L (ref 135–145)

## 2014-10-27 LAB — GLUCOSE, CAPILLARY
GLUCOSE-CAPILLARY: 131 mg/dL — AB (ref 70–99)
GLUCOSE-CAPILLARY: 130 mg/dL — AB (ref 70–99)
Glucose-Capillary: 119 mg/dL — ABNORMAL HIGH (ref 70–99)
Glucose-Capillary: 238 mg/dL — ABNORMAL HIGH (ref 70–99)

## 2014-10-27 LAB — APTT: APTT: 29 s (ref 24–37)

## 2014-10-27 LAB — CBC
HEMATOCRIT: 26.3 % — AB (ref 36.0–46.0)
Hemoglobin: 8.2 g/dL — ABNORMAL LOW (ref 12.0–15.0)
MCH: 25.6 pg — AB (ref 26.0–34.0)
MCHC: 31.2 g/dL (ref 30.0–36.0)
MCV: 82.2 fL (ref 78.0–100.0)
Platelets: 160 10*3/uL (ref 150–400)
RBC: 3.2 MIL/uL — AB (ref 3.87–5.11)
RDW: 17.8 % — ABNORMAL HIGH (ref 11.5–15.5)
WBC: 11.2 10*3/uL — ABNORMAL HIGH (ref 4.0–10.5)

## 2014-10-27 LAB — MAGNESIUM: Magnesium: 2.5 mg/dL (ref 1.5–2.5)

## 2014-10-27 MED ORDER — DEXTROSE 5 % IV SOLN
10.0000 mmol | Freq: Once | INTRAVENOUS | Status: AC
  Administered 2014-10-27: 10 mmol via INTRAVENOUS
  Filled 2014-10-27: qty 3.33
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
## 2014-10-27 NOTE — Progress Notes (Signed)
Pt stated she was not ready to go on BIPAP at this time. RT will continue to check on pt.

## 2014-10-27 NOTE — Evaluation (Signed)
Clinical/Bedside Swallow Evaluation Patient Details  Name: Krista KalataMary O Bily MRN: [id number] Date of Birth: 1946/03/27  Today's Date: 10/27/2014 Time: SLP Start Time (ACUTE ONLY): 1336 SLP Stop Time (ACUTE ONLY): 1346 SLP Time Calculation (min) (ACUTE ONLY): 10 min  Past Medical History:  Past Medical History  Diagnosis Date  . Hypertension   . Depression   . Diabetes mellitus   . GERD (gastroesophageal reflux disease)   . Obesity   . Coronary artery disease 08/2006    Unstable angina. PCI to distal codominant CFX with 2.5 x 20 Taxus DES...  . CHF (congestive heart failure)   . Sleep apnea     stopped cpap  . Arthritis   . Headache(784.0)   . Hx of echocardiogram     Echo (11/15):  Mild LVH, EF 55-60%, no RWMA, Gr 1 DD, mild AI, mild MR, mod LAE.    Past Surgical History:  Past Surgical History  Procedure Laterality Date  . Coronary stent placement  08/2006    CAD; [facility]  . Total abdominal hysterectomy    . Cardiac catheterization      x2            mcclean  . Lumbar fusion  02/02/2013  . Back surgery    . Right heart catheterization N/A 06/18/2014    Procedure: RIGHT HEART CATH;  Surgeon: Laurey Moralealton S McLean, MD;  [facility];  Service: Cardiovascular;  Laterality: N/A;   HPI:  Pt is a 69 y/o female with PMH of morbid obesity, GERD, DM withneuropathy, HTN, CAD s/p stent placement, CHF, headaches, arthritis and untreated obstructive sleep apnea. Pt came in with primary complaint of nausea and vomiting with diarrhea with reduced oral intake. In ED pt continued to decline and was intubated on 3/27. Pt extubated 3/29. CXR revealed left lower lobe consolidation., but lungs otherwise clear.    Assessment / Plan / Recommendation Clinical Impression   Pt was seen for swallowing evaluation. Clear vocal quality at baseline. PO trials with thin liquids and solid consistencies did not elicit s/s of aspiration. PO trials with purees elicited immediate cough x1;  continued trials did not elicit cough. Recommend pt initiate regular/ thin liquid diet; meds whole with liquids; straws ok. Speech will follow-up x1 for diet tolerance.     Aspiration Risk  Mild    Diet Recommendation Regular;Thin liquid   Liquid Administration via: Cup;Straw Medication Administration: Whole meds with liquid Supervision: Patient able to self feed Compensations: Slow rate;Small sips/bites Postural Changes and/or Swallow Maneuvers: Seated upright 90 degrees    Other  Recommendations Oral Care Recommendations: Oral care BID   Follow Up Recommendations  None    Frequency and Duration min 1 x/week  1 week   Pertinent Vitals/Pain     SLP Swallow Goals     Swallow Study Prior Functional Status       General HPI: Pt is a 69 y/o female with PMH of morbid obesity, GERD, DM withneuropathy, HTN, CAD s/p stent placement, CHF, headaches, arthritis and untreated obstructive sleep apnea. Pt came in with primary complaint of nausea and vomiting with diarrhea with reduced oral intake. In ED pt continued to decline and was intubated on 3/27. Pt extubated 3/29. CXR revealed left lower lobe consolidation., but lungs otherwise clear.  Type of Study: Bedside swallow evaluation Diet Prior to this Study: NPO Temperature Spikes Noted: No Respiratory Status: Room air History of Recent Intubation: Yes Length of Intubations (days): 2 days Date  extubated: 10/19/14 Behavior/Cognition: Alert;Cooperative;Pleasant mood Oral Cavity - Dentition: Adequate natural dentition Self-Feeding Abilities: Able to feed self Patient Positioning: Upright in bed Baseline Vocal Quality: Clear Volitional Cough: Strong    Oral/Motor/Sensory Function Overall Oral Motor/Sensory Function: Appears within functional limits for tasks assessed Labial ROM: Within Functional Limits Labial Symmetry: Within Functional Limits Lingual ROM: Within Functional Limits Lingual Symmetry: Within Functional Limits Facial  ROM: Within Functional Limits Facial Symmetry: Within Functional Limits   Ice Chips Ice chips: Not tested   Thin Liquid Thin Liquid: Within functional limits Presentation: Cup;Straw    Nectar Thick Nectar Thick Liquid: Not tested   Honey Thick Honey Thick Liquid: Not tested   Puree Puree: Within functional limits   Solid   GO    Solid: Within functional limits       Bermudez-Bosch, Areta Terwilliger 10/27/2014,2:27 PM

## 2014-10-27 NOTE — Progress Notes (Signed)
S: No new CO.  Wants to eat O:BP 98/44 mmHg  Pulse 64  Temp(Src) 99.7 F (37.6 C) (Core (Comment))  Resp 19  Ht 5\' 6"  (1.676 m)  Wt 96.6 kg (212 lb 15.4 oz)  BMI 34.39 kg/m2  SpO2 98%  Intake/Output Summary (Last 24 hours) at 10/27/14 0655 Last data filed at 10/27/14 0600  Gross per 24 hour  Intake    170 ml  Output   2706 ml  Net  -2536 ml   Weight change: -3.6 kg (-7 lb 15 oz) ZOX:WRUEAGen:Awake and alert CVS: irreg,irreg 3/6 systolic M Resp: Few faint bibasilar crackles Abd:+ BS NTND No HSM Ext: No edema.  Rt foot wrapped NEURO: Answers ?Marland Kitchen.  Ox2   . antiseptic oral rinse  7 mL Mouth Rinse QID  . aspirin  325 mg Oral Daily  . chlorhexidine  15 mL Mouth Rinse BID  . DULoxetine  60 mg Oral BID  . famotidine  20 mg Oral QHS  . insulin aspart  0-20 Units Subcutaneous Q4H  . rOPINIRole  2 mg Oral QHS  . silver sulfADIAZINE   Topical Daily   Dg Chest Port 1 View  10/26/2014   CLINICAL DATA:  Congestive heart failure. Shortness of breath. Coronary artery disease.  EXAM: PORTABLE CHEST - 1 VIEW  COMPARISON:  10/24/2014  FINDINGS: Right jugular central venous catheter remains in appropriate position. Cardiomegaly is stable. There is mild improvement in bilateral airspace disease, consistent with mild improvement in pulmonary edema. Decreased left pleural effusion and/or retrocardiac atelectasis also noted since prior exam.  IMPRESSION: Mild improvement in diffuse pulmonary edema and left retrocardiac atelectasis and/or effusion.   Electronically Signed   By: Myles RosenthalJohn  Stahl M.D.   On: 10/26/2014 08:36   BMET    Component Value Date/Time   NA 136 10/27/2014 0509   K 3.9 10/27/2014 0509   CL 102 10/27/2014 0509   CO2 23 10/27/2014 0509   GLUCOSE 136* 10/27/2014 0509   BUN 14 10/27/2014 0509   CREATININE 1.83* 10/27/2014 0509   CREATININE 2.38* 06/13/2014 1648   CALCIUM 8.4 10/27/2014 0509   GFRNONAA 28* 10/27/2014 0509   GFRAA 32* 10/27/2014 0509   CBC    Component Value Date/Time   WBC 11.2* 10/27/2014 0509   RBC 3.20* 10/27/2014 0509   RBC 3.41* 07/13/2014 0706   HGB 8.2* 10/27/2014 0509   HCT 26.3* 10/27/2014 0509   PLT 160 10/27/2014 0509   MCV 82.2 10/27/2014 0509   MCH 25.6* 10/27/2014 0509   MCHC 31.2 10/27/2014 0509   RDW 17.8* 10/27/2014 0509   LYMPHSABS 1.0 10/19/2014 1405   MONOABS 0.3 10/19/2014 1405   EOSABS 0.0 10/19/2014 1405   BASOSABS 0.0 10/19/2014 1405     Assessment:  1. Acute on CKD 3 presumably due to ischemic ATN.  Has been on CVVHD with plan to transition to IHD.  Still no significant UO. 2. Mild Hypo PO4 due to CVVHD.  Will correct on its own now that off CVVHD 3. DM 4. Anemia  Plan: 1.  Will plan HD in AM 2. DC foley   Aeron Lheureux T

## 2014-10-28 DIAGNOSIS — L03032 Cellulitis of left toe: Secondary | ICD-10-CM

## 2014-10-28 DIAGNOSIS — L0390 Cellulitis, unspecified: Secondary | ICD-10-CM | POA: Diagnosis present

## 2014-10-28 DIAGNOSIS — T25021A Burn of unspecified degree of right foot, initial encounter: Secondary | ICD-10-CM

## 2014-10-28 DIAGNOSIS — L03031 Cellulitis of right toe: Secondary | ICD-10-CM

## 2014-10-28 DIAGNOSIS — I10 Essential (primary) hypertension: Secondary | ICD-10-CM

## 2014-10-28 DIAGNOSIS — T25022D Burn of unspecified degree of left foot, subsequent encounter: Secondary | ICD-10-CM

## 2014-10-28 DIAGNOSIS — IMO0002 Reserved for concepts with insufficient information to code with codable children: Secondary | ICD-10-CM | POA: Diagnosis present

## 2014-10-28 DIAGNOSIS — E1165 Type 2 diabetes mellitus with hyperglycemia: Secondary | ICD-10-CM | POA: Diagnosis present

## 2014-10-28 DIAGNOSIS — I5033 Acute on chronic diastolic (congestive) heart failure: Secondary | ICD-10-CM | POA: Diagnosis present

## 2014-10-28 LAB — RENAL FUNCTION PANEL
Albumin: 2.4 g/dL — ABNORMAL LOW (ref 3.5–5.2)
Anion gap: 6 (ref 5–15)
BUN: 30 mg/dL — ABNORMAL HIGH (ref 6–23)
CALCIUM: 8.5 mg/dL (ref 8.4–10.5)
CO2: 26 mmol/L (ref 19–32)
CREATININE: 3.46 mg/dL — AB (ref 0.50–1.10)
Chloride: 100 mmol/L (ref 96–112)
GFR, EST AFRICAN AMERICAN: 15 mL/min — AB (ref >90)
GFR, EST NON AFRICAN AMERICAN: 13 mL/min — AB (ref >90)
Glucose, Bld: 233 mg/dL — ABNORMAL HIGH (ref 70–99)
Phosphorus: 2 mg/dL — ABNORMAL LOW (ref 2.3–4.6)
Potassium: 3.6 mmol/L (ref 3.5–5.1)
SODIUM: 132 mmol/L — AB (ref 135–145)

## 2014-10-28 LAB — GLUCOSE, CAPILLARY
GLUCOSE-CAPILLARY: 341 mg/dL — AB (ref 70–99)
GLUCOSE-CAPILLARY: 214 mg/dL — AB (ref 70–99)
GLUCOSE-CAPILLARY: 255 mg/dL — AB (ref 70–99)
Glucose-Capillary: 191 mg/dL — ABNORMAL HIGH (ref 70–99)
Glucose-Capillary: 140 mg/dL — ABNORMAL HIGH (ref 70–99)
Glucose-Capillary: 206 mg/dL — ABNORMAL HIGH (ref 70–99)

## 2014-10-28 LAB — CBC WITH DIFFERENTIAL/PLATELET
BASOS PCT: 0 % (ref 0–1)
Basophils Absolute: 0 10*3/uL (ref 0.0–0.1)
EOS ABS: 0.2 10*3/uL (ref 0.0–0.7)
Eosinophils Relative: 2 % (ref 0–5)
HCT: 29.5 % — ABNORMAL LOW (ref 36.0–46.0)
HEMOGLOBIN: 8.8 g/dL — AB (ref 12.0–15.0)
Lymphocytes Relative: 6 % — ABNORMAL LOW (ref 12–46)
Lymphs Abs: 0.8 10*3/uL (ref 0.7–4.0)
MCH: 24.4 pg — AB (ref 26.0–34.0)
MCHC: 29.8 g/dL — AB (ref 30.0–36.0)
MCV: 81.9 fL (ref 78.0–100.0)
MONO ABS: 1.7 10*3/uL — AB (ref 0.1–1.0)
MONOS PCT: 12 % (ref 3–12)
Neutro Abs: 11.6 10*3/uL — ABNORMAL HIGH (ref 1.7–7.7)
Neutrophils Relative %: 81 % — ABNORMAL HIGH (ref 43–77)
Platelets: 204 10*3/uL (ref 150–400)
RBC: 3.6 MIL/uL — ABNORMAL LOW (ref 3.87–5.11)
RDW: 18.3 % — ABNORMAL HIGH (ref 11.5–15.5)
WBC: 14.4 10*3/uL — ABNORMAL HIGH (ref 4.0–10.5)

## 2014-10-28 LAB — HEPATITIS B SURFACE ANTIGEN: Hepatitis B Surface Ag: NEGATIVE

## 2014-10-28 LAB — LACTIC ACID, PLASMA: Lactic Acid, Venous: 1.6 mmol/L (ref 0.5–2.0)

## 2014-10-28 LAB — APTT: aPTT: 26 seconds (ref 24–37)

## 2014-10-28 LAB — MAGNESIUM: Magnesium: 2.4 mg/dL (ref 1.5–2.5)

## 2014-10-28 MED ORDER — RENA-VITE PO TABS
1.0000 | ORAL_TABLET | Freq: Every day | ORAL | Status: DC
  Administered 2014-10-28 – 2014-11-13 (×17): 1 via ORAL
  Filled 2014-10-28 (×26): qty 1

## 2014-10-28 MED ORDER — MIDODRINE HCL 5 MG PO TABS
5.0000 mg | ORAL_TABLET | Freq: Three times a day (TID) | ORAL | Status: DC
  Administered 2014-10-28 – 2014-11-10 (×37): 5 mg via ORAL
  Filled 2014-10-28 (×41): qty 1

## 2014-10-28 MED ORDER — SODIUM CHLORIDE 0.9 % IV BOLUS (SEPSIS)
250.0000 mL | Freq: Once | INTRAVENOUS | Status: AC
  Administered 2014-10-28: 250 mL via INTRAVENOUS

## 2014-10-28 MED ORDER — HEPARIN SODIUM (PORCINE) 5000 UNIT/ML IJ SOLN
5000.0000 [IU] | Freq: Three times a day (TID) | INTRAMUSCULAR | Status: DC
  Administered 2014-10-28 – 2014-11-09 (×32): 5000 [IU] via SUBCUTANEOUS
  Filled 2014-10-28 (×39): qty 1

## 2014-10-28 MED ORDER — INSULIN GLARGINE 100 UNIT/ML ~~LOC~~ SOLN
8.0000 [IU] | Freq: Every day | SUBCUTANEOUS | Status: DC
  Administered 2014-10-28 – 2014-11-01 (×5): 8 [IU] via SUBCUTANEOUS
  Filled 2014-10-28 (×8): qty 0.08

## 2014-10-28 NOTE — Progress Notes (Signed)
ANTICOAGULATION CONSULT NOTE - Initial Consult  Pharmacy Consult for heparin Indication: VTE prophylaxis  No Known Allergies  Patient Measurements: Height:  (167.6 cm) Weight: 208 lb 12.4 oz (94.7 kg) IBW/kg (Calculated) : 59.3 Heparin Dosing Weight:   Vital Signs: Temp: 98.9 F (37.2 C) (04/05 2016) Temp Source: Oral (04/05 2016) BP: 105/51 mmHg (04/05 2016) Pulse Rate: 72 (04/05 2016)  Labs:  Recent Labs  10/26/14 0250  10/26/14 0500 10/26/14 1333 10/27/14 0509 10/28/14 0249  HGB 8.7*  --   --   --  8.2* 8.8*  HCT 29.2*  --   --   --  26.3* 29.5*  PLT 204  --   --   --  160 204  APTT 159*  --   --   --  29 26  HEPARINUNFRC  --   --  0.73*  --   --   --   CREATININE  --   < >  --  1.32* 1.83* 3.46*  < > = values in this interval not displayed.  Estimated Creatinine Clearance: 18.6 mL/min (by C-G formula based on Cr of 3.46).   Medical History: Past Medical History  Diagnosis Date  . Hypertension   . Depression   . Diabetes mellitus   . GERD (gastroesophageal reflux disease)   . Obesity   . Coronary artery disease 08/2006    Unstable angina. PCI to distal codominant CFX with 2.5 x 20 Taxus DES...  . CHF (congestive heart failure)   . Sleep apnea     stopped cpap  . Arthritis   . Headache(784.0)   . Hx of echocardiogram     Echo (11/15):  Mild LVH, EF 55-60%, no RWMA, Gr 1 DD, mild AI, mild MR, mod LAE.     Medications:  Prescriptions prior to admission  Medication Sig Dispense Refill Last Dose  . metolazone (ZAROXOLYN) 2.5 MG tablet Take 1 tablet (2.5 mg total) by mouth daily as needed. Weight gain 2 lb or greater overnight. 15 tablet 3 10/17/2014 at Unknown time  . aspirin EC 81 MG tablet Take 1 tablet (81 mg total) by mouth daily.   10/17/2014  . atorvastatin (LIPITOR) 40 MG tablet Take 40 mg by mouth daily at 6 PM.    10/16/2014  . bisoprolol (ZEBETA) 5 MG tablet Take 0.5 tablets (2.5 mg total) by mouth daily. 45 tablet 3 10/17/2014 at 0900  .  Cholecalciferol (VITAMIN D3) 1000 UNITS CAPS Take 1,000 Units by mouth daily.   10/17/2014  . DULoxetine (CYMBALTA) 60 MG capsule Take 60 mg by mouth 2 (two) times daily.    10/17/2014  . ferrous sulfate 325 (65 FE) MG tablet Take 1 tablet (325 mg total) by mouth 2 (two) times daily. (Patient taking differently: Take 325 mg by mouth daily with breakfast. ) 60 tablet 3 10/17/2014  . gabapentin (NEURONTIN) 400 MG capsule Take 400 mg by mouth 2 (two) times daily.   10/17/2014  . ibuprofen (ADVIL,MOTRIN) 200 MG tablet Take 600 mg by mouth every morning.   10/17/2014  . insulin aspart (NOVOLOG) 100 UNIT/ML injection Inject 5 Units into the skin 3 (three) times daily before meals. For cbg >=150 3 vial PRN 10/17/2014  . insulin glargine (LANTUS) 100 UNIT/ML injection Inject 0.55 mLs (55 Units total) into the skin at bedtime. 10 mL 11 10/17/2014  . metFORMIN (GLUCOPHAGE) 1000 MG tablet Take 1,000 mg by mouth 2 (two) times daily with a meal.   10/17/2014  . Multiple Vitamin (  MULTIVITAMIN WITH MINERALS) TABS Take 1 tablet by mouth daily.   10/17/2014  . oxybutynin (DITROPAN-XL) 10 MG 24 hr tablet Take 10 mg by mouth every morning.   10/17/2014  . pantoprazole (PROTONIX) 40 MG tablet Take 40 mg by mouth daily.   10/17/2014  . rOPINIRole (REQUIP) 2 MG tablet Take 2 mg by mouth at bedtime.    10/11/2014 at Unknown time  . torsemide (DEMADEX) 20 MG tablet TAKE THREE TABLETS BY MOUTH IN THE MORNING 90 tablet 6 10/17/2014  . traZODone (DESYREL) 50 MG tablet Take 75 mg by mouth at bedtime. For sleep.   10/16/2014    Assessment: Pt is transitioning from CRRT to iHD. While pt was on CRRT, pt required high amounts of heparin. Now that she is off CRRT, will resume heparin sq. Hgb 8.8, plts wnl.  Goal of Therapy:  Monitor platelets by anticoagulation protocol: Yes   Plan:  Heparin 5000 units sq q8h  Pharmacy will sign off, please re-consult as needed.   Agapito GamesAlison Pegah Segel, PharmD, BCPS Clinical Pharmacist Pager:  [phone] 10/28/2014 9:16 PM

## 2014-10-28 NOTE — Progress Notes (Signed)
Speech Language Pathology Treatment: Dysphagia  Patient Details Name: Krista KalataMary O Wise MRN: [id number] DOB: 04/08/1946 Today's Date: 10/28/2014 Time: 1400-1410 SLP Time Calculation (min) (ACUTE ONLY): 10 min  Assessment / Plan / Recommendation Clinical Impression  Pt recently returned from HD. No overt difficulty on current diet observed or reported by patient, spouse, or RN. ST to sign off at this time.  Please reconsult if needs arise in the future.   HPI HPI: Pt is a 69 y/o female with PMH of morbid obesity, GERD, DM withneuropathy, HTN, CAD s/p stent placement, CHF, headaches, arthritis and untreated obstructive sleep apnea. Pt came in with primary complaint of nausea and vomiting with diarrhea with reduced oral intake. In ED pt continued to decline and was intubated on 3/27. Pt extubated 3/29. CXR revealed left lower lobe consolidation., but lungs otherwise clear.    Pertinent Vitals Pain Assessment: 0-10 Pain Score: 8  Pain Location: headache Pain Intervention(s): Patient requesting pain meds-RN notified  SLP Plan       Recommendations Diet recommendations: Regular;Thin liquid Medication Administration: Whole meds with liquid Supervision: Patient able to self feed Compensations: Slow rate;Small sips/bites Postural Changes and/or Swallow Maneuvers: Seated upright 90 degrees              Oral Care Recommendations: Oral care BID    GO    Celia B. ColusaBueche, [facility] Behavioral Health CenterMSP, CCC-SLP [phone]  Leigh AuroraBueche, Celia Brown 10/28/2014, 3:24 PM
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
## 2014-10-28 NOTE — Progress Notes (Addendum)
Rehab Admissions Coordinator Note:  Patient was screened by Noel Henandez L for appropriateness for an Inpatient Acute Rehab Consult. I have been following pt's case from afar since last week and noted her significant progress with yesterday's PT note.  At this time, we are recommending Inpatient Rehab consult. We strongly recommend that OT be ordered as well. I spoke with Larita FifeLynn, PT about pt's case as well.  Aryeh Butterfield L 10/28/2014, 2:37 PM  I can be reached at [phone].

## 2014-10-28 NOTE — Progress Notes (Signed)
Pt is re fusing to wear BIPAP tonight. States her breathing is fine without it. Explain to pt benefits but pt still refused. Pt is able to speak full complete appropriate sentences. No distress noted. BBS clear; diminished. Vitals stable SPO2 97% on RA. RT made pt aware that if she changed her mind to call and we will place her on BIPAP machine. RT will continue to monitor.

## 2014-10-28 NOTE — Progress Notes (Signed)
Dressing changed to lt foot using Silvadine and teflon pad; wrapped with Kerlex. tol well. No c/o

## 2014-10-28 NOTE — Progress Notes (Signed)
Inpatient Diabetes Program Recommendations  AACE/ADA: New Consensus Statement on Inpatient Glycemic Control (2013)  Target Ranges:  Prepandial:   less than 140 mg/dL      Peak postprandial:   less than 180 mg/dL (1-2 hours)      Critically ill patients:  140 - 180 mg/dL   Inpatient Diabetes Program Recommendations Insulin - Basal: add Lantus 15 units Correction (SSI): change to TID + HS scale since pt is now eating Thank you  Piedad ClimesGina Betul Brisky BSN, RN,CDE Inpatient Diabetes Coordinator [phone] (team pager)

## 2014-10-28 NOTE — Procedures (Signed)
Pt seen on HD.  Ap 110 Vp 90.  BFR 300.  No UO. If still no UO towards end of week then will plan for permcath.

## 2014-10-29 ENCOUNTER — Inpatient Hospital Stay (HOSPITAL_COMMUNITY): Payer: Medicare Other

## 2014-10-29 LAB — GLUCOSE, CAPILLARY
GLUCOSE-CAPILLARY: 142 mg/dL — AB (ref 70–99)
GLUCOSE-CAPILLARY: 200 mg/dL — AB (ref 70–99)
Glucose-Capillary: 162 mg/dL — ABNORMAL HIGH (ref 70–99)
Glucose-Capillary: 149 mg/dL — ABNORMAL HIGH (ref 70–99)
Glucose-Capillary: 158 mg/dL — ABNORMAL HIGH (ref 70–99)
Glucose-Capillary: 193 mg/dL — ABNORMAL HIGH (ref 70–99)

## 2014-10-29 LAB — MAGNESIUM: Magnesium: 2.1 mg/dL (ref 1.5–2.5)

## 2014-10-29 LAB — CBC WITH DIFFERENTIAL/PLATELET
Basophils Absolute: 0 10*3/uL (ref 0.0–0.1)
Basophils Relative: 0 % (ref 0–1)
EOS PCT: 2 % (ref 0–5)
Eosinophils Absolute: 0.3 10*3/uL (ref 0.0–0.7)
HEMATOCRIT: 30 % — AB (ref 36.0–46.0)
HEMOGLOBIN: 9.1 g/dL — AB (ref 12.0–15.0)
LYMPHS ABS: 1.5 10*3/uL (ref 0.7–4.0)
LYMPHS PCT: 12 % (ref 12–46)
MCH: 25.1 pg — ABNORMAL LOW (ref 26.0–34.0)
MCHC: 30.3 g/dL (ref 30.0–36.0)
MCV: 82.6 fL (ref 78.0–100.0)
MONO ABS: 1.6 10*3/uL — AB (ref 0.1–1.0)
Monocytes Relative: 13 % — ABNORMAL HIGH (ref 3–12)
Neutro Abs: 8.9 10*3/uL — ABNORMAL HIGH (ref 1.7–7.7)
Neutrophils Relative %: 73 % (ref 43–77)
Platelets: 179 10*3/uL (ref 150–400)
RBC: 3.63 MIL/uL — AB (ref 3.87–5.11)
RDW: 19.2 % — ABNORMAL HIGH (ref 11.5–15.5)
WBC: 12.3 10*3/uL — AB (ref 4.0–10.5)

## 2014-10-29 LAB — RENAL FUNCTION PANEL
Albumin: 2.4 g/dL — ABNORMAL LOW (ref 3.5–5.2)
Anion gap: 8 (ref 5–15)
BUN: 18 mg/dL (ref 6–23)
CALCIUM: 8.4 mg/dL (ref 8.4–10.5)
CO2: 27 mmol/L (ref 19–32)
Chloride: 103 mmol/L (ref 96–112)
Creatinine, Ser: 3.04 mg/dL — ABNORMAL HIGH (ref 0.50–1.10)
GFR calc Af Amer: 17 mL/min — ABNORMAL LOW (ref >90)
GFR calc non Af Amer: 15 mL/min — ABNORMAL LOW (ref >90)
Glucose, Bld: 145 mg/dL — ABNORMAL HIGH (ref 70–99)
PHOSPHORUS: 2.6 mg/dL (ref 2.3–4.6)
Potassium: 3.9 mmol/L (ref 3.5–5.1)
SODIUM: 138 mmol/L (ref 135–145)

## 2014-10-29 LAB — HEPATITIS B SURFACE ANTIBODY,QUALITATIVE
HEP B S AB: NONREACTIVE
Hep B S Ab: NONREACTIVE

## 2014-10-29 LAB — LIPID PANEL
Cholesterol: 95 mg/dL (ref 0–200)
HDL: 21 mg/dL — ABNORMAL LOW (ref >39)
LDL CALC: 36 mg/dL (ref 0–99)
TRIGLYCERIDES: 188 mg/dL — AB (ref <150)
Total CHOL/HDL Ratio: 4.5 RATIO
VLDL: 38 mg/dL (ref 0–40)

## 2014-10-29 LAB — HEPATITIS B CORE ANTIBODY, TOTAL
Hep B Core Total Ab: NEGATIVE
Hep B Core Total Ab: NEGATIVE

## 2014-10-29 LAB — APTT: aPTT: 31 seconds (ref 24–37)

## 2014-10-29 LAB — HEPATITIS B SURFACE ANTIGEN: Hepatitis B Surface Ag: NEGATIVE

## 2014-10-29 MED ORDER — INSULIN ASPART 100 UNIT/ML ~~LOC~~ SOLN
0.0000 [IU] | Freq: Three times a day (TID) | SUBCUTANEOUS | Status: DC
  Administered 2014-10-30: 3 [IU] via SUBCUTANEOUS
  Administered 2014-10-30 – 2014-10-31 (×3): 4 [IU] via SUBCUTANEOUS
  Administered 2014-10-31: 7 [IU] via SUBCUTANEOUS
  Administered 2014-11-01: 4 [IU] via SUBCUTANEOUS
  Administered 2014-11-02 (×2): 7 [IU] via SUBCUTANEOUS
  Administered 2014-11-02 – 2014-11-05 (×7): 4 [IU] via SUBCUTANEOUS
  Administered 2014-11-05 (×2): 11 [IU] via SUBCUTANEOUS
  Administered 2014-11-06 (×2): 4 [IU] via SUBCUTANEOUS
  Administered 2014-11-06: 11 [IU] via SUBCUTANEOUS
  Administered 2014-11-07 (×3): 7 [IU] via SUBCUTANEOUS
  Administered 2014-11-08 (×2): 4 [IU] via SUBCUTANEOUS
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
## 2014-10-30 DIAGNOSIS — E785 Hyperlipidemia, unspecified: Secondary | ICD-10-CM | POA: Diagnosis present

## 2014-10-30 DIAGNOSIS — T25022A Burn of unspecified degree of left foot, initial encounter: Secondary | ICD-10-CM

## 2014-10-30 DIAGNOSIS — N186 End stage renal disease: Secondary | ICD-10-CM

## 2014-10-30 DIAGNOSIS — G7281 Critical illness myopathy: Secondary | ICD-10-CM

## 2014-10-30 LAB — CBC WITH DIFFERENTIAL/PLATELET
Basophils Absolute: 0 10*3/uL (ref 0.0–0.1)
Basophils Relative: 0 % (ref 0–1)
Eosinophils Absolute: 0.3 10*3/uL (ref 0.0–0.7)
Eosinophils Relative: 2 % (ref 0–5)
HEMATOCRIT: 29.1 % — AB (ref 36.0–46.0)
Hemoglobin: 9 g/dL — ABNORMAL LOW (ref 12.0–15.0)
Lymphocytes Relative: 10 % — ABNORMAL LOW (ref 12–46)
Lymphs Abs: 1.2 10*3/uL (ref 0.7–4.0)
MCH: 25.9 pg — ABNORMAL LOW (ref 26.0–34.0)
MCHC: 30.9 g/dL (ref 30.0–36.0)
MCV: 83.6 fL (ref 78.0–100.0)
MONO ABS: 1.7 10*3/uL — AB (ref 0.1–1.0)
Monocytes Relative: 13 % — ABNORMAL HIGH (ref 3–12)
Neutro Abs: 9.8 10*3/uL — ABNORMAL HIGH (ref 1.7–7.7)
Neutrophils Relative %: 75 % (ref 43–77)
Platelets: 170 10*3/uL (ref 150–400)
RBC: 3.48 MIL/uL — AB (ref 3.87–5.11)
RDW: 19.7 % — ABNORMAL HIGH (ref 11.5–15.5)
WBC: 13 10*3/uL — ABNORMAL HIGH (ref 4.0–10.5)

## 2014-10-30 LAB — RENAL FUNCTION PANEL
ANION GAP: 9 (ref 5–15)
Albumin: 2.5 g/dL — ABNORMAL LOW (ref 3.5–5.2)
BUN: 30 mg/dL — ABNORMAL HIGH (ref 6–23)
CO2: 25 mmol/L (ref 19–32)
Calcium: 8.6 mg/dL (ref 8.4–10.5)
Chloride: 103 mmol/L (ref 96–112)
Creatinine, Ser: 4.47 mg/dL — ABNORMAL HIGH (ref 0.50–1.10)
GFR calc Af Amer: 11 mL/min — ABNORMAL LOW (ref >90)
GFR calc non Af Amer: 9 mL/min — ABNORMAL LOW (ref >90)
Glucose, Bld: 177 mg/dL — ABNORMAL HIGH (ref 70–99)
POTASSIUM: 4.5 mmol/L (ref 3.5–5.1)
Phosphorus: 4.2 mg/dL (ref 2.3–4.6)
SODIUM: 137 mmol/L (ref 135–145)

## 2014-10-30 LAB — HEMOGLOBIN A1C
HEMOGLOBIN A1C: 7.6 % — AB (ref 4.8–5.6)
MEAN PLASMA GLUCOSE: 171 mg/dL

## 2014-10-30 LAB — GLUCOSE, CAPILLARY
GLUCOSE-CAPILLARY: 156 mg/dL — AB (ref 70–99)
GLUCOSE-CAPILLARY: 145 mg/dL — AB (ref 70–99)
Glucose-Capillary: 166 mg/dL — ABNORMAL HIGH (ref 70–99)
Glucose-Capillary: 205 mg/dL — ABNORMAL HIGH (ref 70–99)

## 2014-10-30 LAB — APTT: APTT: 29 s (ref 24–37)

## 2014-10-30 LAB — PHOSPHORUS: Phosphorus: 4.1 mg/dL (ref 2.3–4.6)

## 2014-10-30 LAB — MAGNESIUM: Magnesium: 2.2 mg/dL (ref 1.5–2.5)

## 2014-10-30 MED ORDER — ATORVASTATIN CALCIUM 40 MG PO TABS
40.0000 mg | ORAL_TABLET | Freq: Every day | ORAL | Status: DC
  Administered 2014-10-30 – 2014-11-13 (×15): 40 mg via ORAL
  Filled 2014-10-30 (×18): qty 1

## 2014-10-30 MED ORDER — MIDODRINE HCL 5 MG PO TABS
ORAL_TABLET | ORAL | Status: AC
  Filled 2014-10-30: qty 1

## 2014-10-30 MED ORDER — NEPRO/CARBSTEADY PO LIQD
237.0000 mL | Freq: Two times a day (BID) | ORAL | Status: DC
  Administered 2014-10-31 – 2014-11-02 (×2): 237 mL via ORAL
  Filled 2014-10-30: qty 237

## 2014-10-30 MED ORDER — FERUMOXYTOL INJECTION 510 MG/17 ML
510.0000 mg | Freq: Once | INTRAVENOUS | Status: DC
  Filled 2014-10-30: qty 17
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
## 2014-10-30 NOTE — Progress Notes (Signed)
Pt refused to use BIPAP. No resp distress noted.

## 2014-10-30 NOTE — Progress Notes (Signed)
[facility] Pt wears BIPAP QHS. Pt was using Servo in 2C. Settings/O2 reported to Resp. To take pt a BiPap floor unit. RT reported she would set pt up.
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
## 2014-10-30 NOTE — Progress Notes (Signed)
I met with pt and her spouse at bedside. We discussed a possible inpt rehab stay when medically ready if bed is available. They prefer inpt rehab for the intensity of therapy rather than SNF as they have been to [facility] in the past. I will follow [phone]

## 2014-10-31 DIAGNOSIS — N170 Acute kidney failure with tubular necrosis: Secondary | ICD-10-CM

## 2014-10-31 LAB — CBC WITH DIFFERENTIAL/PLATELET
Basophils Absolute: 0 10*3/uL (ref 0.0–0.1)
Basophils Relative: 0 % (ref 0–1)
EOS ABS: 0.2 10*3/uL (ref 0.0–0.7)
EOS PCT: 2 % (ref 0–5)
HEMATOCRIT: 29.6 % — AB (ref 36.0–46.0)
Hemoglobin: 8.9 g/dL — ABNORMAL LOW (ref 12.0–15.0)
Lymphocytes Relative: 10 % — ABNORMAL LOW (ref 12–46)
Lymphs Abs: 1 10*3/uL (ref 0.7–4.0)
MCH: 25.1 pg — ABNORMAL LOW (ref 26.0–34.0)
MCHC: 30.1 g/dL (ref 30.0–36.0)
MCV: 83.4 fL (ref 78.0–100.0)
MONOS PCT: 14 % — AB (ref 3–12)
Monocytes Absolute: 1.5 10*3/uL — ABNORMAL HIGH (ref 0.1–1.0)
Neutro Abs: 7.9 10*3/uL — ABNORMAL HIGH (ref 1.7–7.7)
Neutrophils Relative %: 74 % (ref 43–77)
PLATELETS: 174 10*3/uL (ref 150–400)
RBC: 3.55 MIL/uL — ABNORMAL LOW (ref 3.87–5.11)
RDW: 19.7 % — ABNORMAL HIGH (ref 11.5–15.5)
WBC: 10.7 10*3/uL — ABNORMAL HIGH (ref 4.0–10.5)

## 2014-10-31 LAB — GLUCOSE, CAPILLARY
GLUCOSE-CAPILLARY: 208 mg/dL — AB (ref 70–99)
GLUCOSE-CAPILLARY: 185 mg/dL — AB (ref 70–99)
GLUCOSE-CAPILLARY: 166 mg/dL — AB (ref 70–99)
Glucose-Capillary: 232 mg/dL — ABNORMAL HIGH (ref 70–99)

## 2014-10-31 LAB — RENAL FUNCTION PANEL
ALBUMIN: 2.4 g/dL — AB (ref 3.5–5.2)
ANION GAP: 9 (ref 5–15)
BUN: 15 mg/dL (ref 6–23)
CHLORIDE: 104 mmol/L (ref 96–112)
CO2: 26 mmol/L (ref 19–32)
Calcium: 8.7 mg/dL (ref 8.4–10.5)
Creatinine, Ser: 3.29 mg/dL — ABNORMAL HIGH (ref 0.50–1.10)
GFR, EST AFRICAN AMERICAN: 16 mL/min — AB (ref >90)
GFR, EST NON AFRICAN AMERICAN: 14 mL/min — AB (ref >90)
Glucose, Bld: 208 mg/dL — ABNORMAL HIGH (ref 70–99)
POTASSIUM: 3.8 mmol/L (ref 3.5–5.1)
Phosphorus: 3.5 mg/dL (ref 2.3–4.6)
Sodium: 139 mmol/L (ref 135–145)

## 2014-10-31 LAB — APTT: APTT: 31 s (ref 24–37)

## 2014-10-31 LAB — MAGNESIUM: Magnesium: 2 mg/dL (ref 1.5–2.5)

## 2014-10-31 MED ORDER — SODIUM CHLORIDE 0.9 % IJ SOLN
10.0000 mL | INTRAMUSCULAR | Status: DC | PRN
  Administered 2014-10-31: 10 mL

## 2014-10-31 MED ORDER — CEFAZOLIN SODIUM 1-5 GM-% IV SOLN
1.0000 g | INTRAVENOUS | Status: DC
Start: 2014-11-01 — End: 2014-10-31

## 2014-10-31 MED ORDER — CEFAZOLIN SODIUM-DEXTROSE 2-3 GM-% IV SOLR
2.0000 g | INTRAVENOUS | Status: AC
  Administered 2014-11-01: 2 g via INTRAVENOUS
  Filled 2014-10-31 (×2): qty 50

## 2014-10-31 MED ORDER — ACETAMINOPHEN 325 MG PO TABS
650.0000 mg | ORAL_TABLET | Freq: Four times a day (QID) | ORAL | Status: DC | PRN
  Administered 2014-10-31 – 2014-11-12 (×8): 650 mg via ORAL
  Filled 2014-10-31 (×7): qty 2

## 2014-10-31 NOTE — Care Management Note (Signed)
CARE MANAGEMENT NOTE 10/31/2014  Patient:  Krista Wise, Krista Wise   Account Number:  [id number]  Date Initiated:  10/20/2014  Documentation initiated by:  MAYO,HENRIETTA  Subjective/Objective Assessment:   dx resp failure/hypotension; lives with spouse, has home O2    PCP  Janeann Merl     Action/Plan:   10/31/14 Met with pt ,plan is for possible CIR. CIR following.   Anticipated DC Date:  11/03/2014   Anticipated DC Plan:  IP REHAB FACILITY      DC Planning Services  CM consult      Choice offered to / List presented to:             Status of service:  In process, will continue to follow Medicare Important Message given?  YES (If response is "NO", the following Medicare IM given date fields will be blank) Date Medicare IM given:  10/23/2014 Medicare IM given by:  MAYO,HENRIETTA Date Additional Medicare IM given:  10/31/2014 Additional Medicare IM given by:  [facility]  Discharge Disposition:    Per UR Regulation:  Reviewed for med. necessity/level of care/duration of stay  If discussed at [place] of Stay Meetings, dates discussed:    Comments:  10/31/2014 CIR following for possible adm. Vein Mapping today.  CRoyal RN MPH, case manager, [phone]  10/27/14 [place] RN MSN BSN CCM Transferred to [place].  PT recommending CIR if pt progresses. Pt has been basically bed to chair for several years, spouse is primary caregiver, supplements with paid caregivers.
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
## 2014-10-31 NOTE — Progress Notes (Signed)
I met with pt and her spouse at bedside. I discussed with Dr. Mercy Moore today that pt not yet declared ESRD therefore I am unable to proceed to admit pt to inpt rehab unitl declared ESRD or ARF resolved not requiring hemodialysis. Spouse is aware. Discussed with RN CM and SW. We will follow up on Monday. [phone]

## 2014-10-31 NOTE — Progress Notes (Signed)
Upper extremity vein mapping was cancelled after test was already completed. Please reorder this test to that the report will link to EPIC.  Thank you.  Farrel DemarkJill Eunice, RDMS, RVT 10/31/2014 10:38 AM
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
## 2014-10-31 NOTE — Progress Notes (Signed)
Inpatient Diabetes Program Recommendations  AACE/ADA: New Consensus Statement on Inpatient Glycemic Control (2013)  Target Ranges:  Prepandial:   less than 140 mg/dL      Peak postprandial:   less than 180 mg/dL (1-2 hours)      Critically ill patients:  140 - 180 mg/dL   Reason for Assessment:  Results for Krista Wise, Krista Wise (MRN [id number]) as of 10/31/2014 12:24  Ref. Range 10/30/2014 12:47 10/30/2014 17:50 10/30/2014 21:01 10/31/2014 07:56 10/31/2014 11:33  Glucose-Capillary Latest Range: 70-99 mg/dL 696166 (H) 295145 (H) 284205 (H) 208 (H) 185 (H)   May consider increasing Lantus to 14 units q HS.  Thanks, Beryl MeagerJenny Evaline Waltman, RN, BC-ADM Inpatient Diabetes Coordinator Pager [phone] (8a-5p)
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
## 2014-11-01 ENCOUNTER — Inpatient Hospital Stay (HOSPITAL_COMMUNITY): Payer: Medicare Other

## 2014-11-01 LAB — APTT: aPTT: 31 seconds (ref 24–37)

## 2014-11-01 LAB — BASIC METABOLIC PANEL
Anion gap: 13 (ref 5–15)
BUN: 26 mg/dL — ABNORMAL HIGH (ref 6–23)
CO2: 24 mmol/L (ref 19–32)
Calcium: 8.9 mg/dL (ref 8.4–10.5)
Chloride: 101 mmol/L (ref 96–112)
Creatinine, Ser: 4.92 mg/dL — ABNORMAL HIGH (ref 0.50–1.10)
GFR calc Af Amer: 10 mL/min — ABNORMAL LOW (ref >90)
GFR calc non Af Amer: 8 mL/min — ABNORMAL LOW (ref >90)
Glucose, Bld: 232 mg/dL — ABNORMAL HIGH (ref 70–99)
Potassium: 4.2 mmol/L (ref 3.5–5.1)
SODIUM: 138 mmol/L (ref 135–145)

## 2014-11-01 LAB — CBC
HEMATOCRIT: 29.8 % — AB (ref 36.0–46.0)
Hemoglobin: 9.1 g/dL — ABNORMAL LOW (ref 12.0–15.0)
MCH: 25.7 pg — AB (ref 26.0–34.0)
MCHC: 30.5 g/dL (ref 30.0–36.0)
MCV: 84.2 fL (ref 78.0–100.0)
Platelets: 185 10*3/uL (ref 150–400)
RBC: 3.54 MIL/uL — ABNORMAL LOW (ref 3.87–5.11)
RDW: 20.2 % — ABNORMAL HIGH (ref 11.5–15.5)
WBC: 12.1 10*3/uL — ABNORMAL HIGH (ref 4.0–10.5)

## 2014-11-01 LAB — GLUCOSE, CAPILLARY
Glucose-Capillary: 158 mg/dL — ABNORMAL HIGH (ref 70–99)
Glucose-Capillary: 173 mg/dL — ABNORMAL HIGH (ref 70–99)
Glucose-Capillary: 227 mg/dL — ABNORMAL HIGH (ref 70–99)

## 2014-11-01 LAB — PROTIME-INR
INR: 1.01 (ref 0.00–1.49)
Prothrombin Time: 13.4 seconds (ref 11.6–15.2)

## 2014-11-01 MED ORDER — MIDODRINE HCL 5 MG PO TABS
ORAL_TABLET | ORAL | Status: AC
  Filled 2014-11-01: qty 1
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
## 2014-11-01 NOTE — Progress Notes (Addendum)
Pt has been refusing BIPAP. Nurses are working with her now. If pt requests to have BIPAP on, I will put it on her. No distress noted at this time.

## 2014-11-01 NOTE — Progress Notes (Signed)
RT entered room to place patient on CPAP and patient is refusing to wear one at this time. RT informed patient if she changed her mind to have RN contact RT and I would place her on it.

## 2014-11-01 NOTE — Procedures (Signed)
Pt seen on HD.  Ap 120 Vp 80.  BFR 300.  Tolerating HD well.

## 2014-11-02 LAB — GLUCOSE, CAPILLARY
GLUCOSE-CAPILLARY: 212 mg/dL — AB (ref 70–99)
Glucose-Capillary: 201 mg/dL — ABNORMAL HIGH (ref 70–99)
Glucose-Capillary: 161 mg/dL — ABNORMAL HIGH (ref 70–99)
Glucose-Capillary: 153 mg/dL — ABNORMAL HIGH (ref 70–99)

## 2014-11-02 LAB — APTT: aPTT: 32 seconds (ref 24–37)

## 2014-11-02 MED ORDER — INSULIN GLARGINE 100 UNIT/ML ~~LOC~~ SOLN
11.0000 [IU] | Freq: Every day | SUBCUTANEOUS | Status: DC
  Administered 2014-11-02 – 2014-11-06 (×5): 11 [IU] via SUBCUTANEOUS
  Filled 2014-11-02 (×6): qty 0.11

## 2014-11-02 MED ORDER — CEFAZOLIN SODIUM-DEXTROSE 2-3 GM-% IV SOLR
2.0000 g | INTRAVENOUS | Status: AC
  Administered 2014-11-03: 2 g via INTRAVENOUS
  Filled 2014-11-02: qty 50

## 2014-11-02 MED ORDER — LIDOCAINE 5 % EX PTCH
1.0000 | MEDICATED_PATCH | CUTANEOUS | Status: DC
  Administered 2014-11-02 – 2014-11-14 (×11): 1 via TRANSDERMAL
  Filled 2014-11-02 (×14): qty 1
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
## 2014-11-03 ENCOUNTER — Encounter (HOSPITAL_COMMUNITY)
Admission: EM | Disposition: A | Discharge status: Rehabilitation Facility | Payer: Self-pay | Source: Home / Self Care | Attending: Pulmonary Disease

## 2014-11-03 ENCOUNTER — Inpatient Hospital Stay (HOSPITAL_COMMUNITY): Payer: Medicare Other | Admitting: Certified Registered Nurse Anesthetist

## 2014-11-03 ENCOUNTER — Inpatient Hospital Stay (HOSPITAL_COMMUNITY): Payer: Medicare Other

## 2014-11-03 ENCOUNTER — Encounter (HOSPITAL_COMMUNITY): Payer: Self-pay | Admitting: Certified Registered Nurse Anesthetist

## 2014-11-03 DIAGNOSIS — N186 End stage renal disease: Secondary | ICD-10-CM

## 2014-11-03 HISTORY — PX: INSERTION OF DIALYSIS CATHETER: SHX1324

## 2014-11-03 LAB — BASIC METABOLIC PANEL
Anion gap: 13 (ref 5–15)
BUN: 38 mg/dL — ABNORMAL HIGH (ref 6–23)
CALCIUM: 9.1 mg/dL (ref 8.4–10.5)
CO2: 25 mmol/L (ref 19–32)
Chloride: 97 mmol/L (ref 96–112)
Creatinine, Ser: 6.24 mg/dL — ABNORMAL HIGH (ref 0.50–1.10)
GFR calc Af Amer: 7 mL/min — ABNORMAL LOW (ref >90)
GFR, EST NON AFRICAN AMERICAN: 6 mL/min — AB (ref >90)
GLUCOSE: 155 mg/dL — AB (ref 70–99)
Potassium: 4.4 mmol/L (ref 3.5–5.1)
Sodium: 135 mmol/L (ref 135–145)

## 2014-11-03 LAB — GLUCOSE, CAPILLARY
GLUCOSE-CAPILLARY: 180 mg/dL — AB (ref 70–99)
GLUCOSE-CAPILLARY: 159 mg/dL — AB (ref 70–99)
Glucose-Capillary: 187 mg/dL — ABNORMAL HIGH (ref 70–99)
Glucose-Capillary: 123 mg/dL — ABNORMAL HIGH (ref 70–99)

## 2014-11-03 LAB — CBC
HEMATOCRIT: 31 % — AB (ref 36.0–46.0)
HEMOGLOBIN: 9.3 g/dL — AB (ref 12.0–15.0)
MCH: 25.1 pg — ABNORMAL LOW (ref 26.0–34.0)
MCHC: 30 g/dL (ref 30.0–36.0)
MCV: 83.6 fL (ref 78.0–100.0)
Platelets: 227 10*3/uL (ref 150–400)
RBC: 3.71 MIL/uL — AB (ref 3.87–5.11)
RDW: 19.6 % — ABNORMAL HIGH (ref 11.5–15.5)
WBC: 10.4 10*3/uL (ref 4.0–10.5)

## 2014-11-03 LAB — POCT I-STAT 4, (NA,K, GLUC, HGB,HCT)
GLUCOSE: 197 mg/dL — AB (ref 70–99)
HCT: 32 % — ABNORMAL LOW (ref 36.0–46.0)
Hemoglobin: 10.9 g/dL — ABNORMAL LOW (ref 12.0–15.0)
Potassium: 4.6 mmol/L (ref 3.5–5.1)
Sodium: 137 mmol/L (ref 135–145)

## 2014-11-03 LAB — APTT: APTT: 31 s (ref 24–37)

## 2014-11-03 LAB — URIC ACID: Uric Acid, Serum: 7.5 mg/dL — ABNORMAL HIGH (ref 2.4–7.0)

## 2014-11-03 SURGERY — INSERTION OF DIALYSIS CATHETER
Anesthesia: Monitor Anesthesia Care | Site: Neck | Laterality: Right

## 2014-11-03 MED ORDER — HYDROMORPHONE HCL 1 MG/ML IJ SOLN: 0.2500 mg | INTRAMUSCULAR | Status: DC | PRN

## 2014-11-03 MED ORDER — PROPOFOL 10 MG/ML IV BOLUS
INTRAVENOUS | Status: DC | PRN
  Administered 2014-11-03 (×4): 10 mg via INTRAVENOUS

## 2014-11-03 MED ORDER — EPHEDRINE SULFATE 50 MG/ML IJ SOLN
INTRAMUSCULAR | Status: AC
  Filled 2014-11-03: qty 1

## 2014-11-03 MED ORDER — FENTANYL CITRATE 0.05 MG/ML IJ SOLN
INTRAMUSCULAR | Status: AC
  Filled 2014-11-03: qty 5

## 2014-11-03 MED ORDER — MIDAZOLAM HCL 2 MG/2ML IJ SOLN
INTRAMUSCULAR | Status: AC
  Filled 2014-11-03: qty 2

## 2014-11-03 MED ORDER — HEPARIN SODIUM (PORCINE) 1000 UNIT/ML IJ SOLN
INTRAMUSCULAR | Status: AC
  Filled 2014-11-03: qty 1

## 2014-11-03 MED ORDER — SODIUM CHLORIDE 0.9 % IJ SOLN
INTRAMUSCULAR | Status: AC
  Filled 2014-11-03: qty 10

## 2014-11-03 MED ORDER — OXYCODONE HCL 5 MG/5ML PO SOLN: 5.0000 mg | Freq: Once | ORAL | Status: DC | PRN

## 2014-11-03 MED ORDER — LIDOCAINE HCL (PF) 1 % IJ SOLN
INTRAMUSCULAR | Status: AC
  Filled 2014-11-03: qty 30

## 2014-11-03 MED ORDER — SODIUM CHLORIDE 0.9 % IR SOLN
Status: DC | PRN
  Administered 2014-11-03: 500 mL

## 2014-11-03 MED ORDER — SODIUM CHLORIDE 0.9 % IV SOLN
INTRAVENOUS | Status: DC | PRN
  Administered 2014-11-03: 07:00:00 via INTRAVENOUS

## 2014-11-03 MED ORDER — HEPARIN SODIUM (PORCINE) 1000 UNIT/ML IJ SOLN
INTRAMUSCULAR | Status: DC | PRN
  Administered 2014-11-03: 4.6 mL

## 2014-11-03 MED ORDER — OXYCODONE HCL 5 MG PO TABS: 5.0000 mg | ORAL_TABLET | Freq: Once | ORAL | Status: DC | PRN

## 2014-11-03 MED ORDER — SUCCINYLCHOLINE CHLORIDE 20 MG/ML IJ SOLN
INTRAMUSCULAR | Status: AC
  Filled 2014-11-03: qty 1

## 2014-11-03 MED ORDER — PROMETHAZINE HCL 25 MG/ML IJ SOLN: 6.2500 mg | INTRAMUSCULAR | Status: DC | PRN

## 2014-11-03 MED ORDER — MIDAZOLAM HCL 5 MG/5ML IJ SOLN
INTRAMUSCULAR | Status: DC | PRN
  Administered 2014-11-03 (×2): 1 mg via INTRAVENOUS

## 2014-11-03 MED ORDER — CEFAZOLIN SODIUM-DEXTROSE 2-3 GM-% IV SOLR
INTRAVENOUS | Status: DC | PRN
  Administered 2014-11-03: 2 g via INTRAVENOUS

## 2014-11-03 MED ORDER — FENTANYL CITRATE 0.05 MG/ML IJ SOLN
INTRAMUSCULAR | Status: DC | PRN
  Administered 2014-11-03 (×3): 25 ug via INTRAVENOUS

## 2014-11-03 MED ORDER — PHENYLEPHRINE 40 MCG/ML (10ML) SYRINGE FOR IV PUSH (FOR BLOOD PRESSURE SUPPORT)
PREFILLED_SYRINGE | INTRAVENOUS | Status: AC
  Filled 2014-11-03: qty 10

## 2014-11-03 MED ORDER — LIDOCAINE HCL (PF) 1 % IJ SOLN
INTRAMUSCULAR | Status: DC | PRN
  Administered 2014-11-03: 10 mL

## 2014-11-03 MED ORDER — PROPOFOL 10 MG/ML IV BOLUS
INTRAVENOUS | Status: AC
  Filled 2014-11-03: qty 20

## 2014-11-03 MED ORDER — 0.9 % SODIUM CHLORIDE (POUR BTL) OPTIME
TOPICAL | Status: DC | PRN
  Administered 2014-11-03: 1000 mL

## 2014-11-03 MED ORDER — LIDOCAINE HCL (CARDIAC) 20 MG/ML IV SOLN
INTRAVENOUS | Status: AC
  Filled 2014-11-03: qty 5

## 2014-11-03 SURGICAL SUPPLY — 45 items
BAG DECANTER FOR FLEXI CONT (MISCELLANEOUS) ×2 IMPLANT
BIOPATCH RED 1 DISK 7.0 (GAUZE/BANDAGES/DRESSINGS) ×2 IMPLANT
CATH CANNON HEMO 15F 50CM (CATHETERS) IMPLANT
CATH CANNON HEMO 15FR 19 (HEMODIALYSIS SUPPLIES) IMPLANT
CATH CANNON HEMO 15FR 23CM (HEMODIALYSIS SUPPLIES) ×1 IMPLANT
CATH CANNON HEMO 15FR 31CM (HEMODIALYSIS SUPPLIES) IMPLANT
CATH CANNON HEMO 15FR 32 (HEMODIALYSIS SUPPLIES) IMPLANT
CATH CANNON HEMO 15FR 32CM (HEMODIALYSIS SUPPLIES) IMPLANT
CATH STRAIGHT 5FR 65CM (CATHETERS) IMPLANT
CHLORAPREP W/TINT 26ML (MISCELLANEOUS) ×2 IMPLANT
COVER PROBE W GEL 5X96 (DRAPES) ×1 IMPLANT
DECANTER SPIKE VIAL GLASS SM (MISCELLANEOUS) ×1 IMPLANT
DRAPE C-ARM 42X72 X-RAY (DRAPES) ×2 IMPLANT
DRAPE CHEST BREAST 15X10 FENES (DRAPES) ×2 IMPLANT
GAUZE SPONGE 2X2 8PLY STRL LF (GAUZE/BANDAGES/DRESSINGS) ×1 IMPLANT
GAUZE SPONGE 4X4 16PLY XRAY LF (GAUZE/BANDAGES/DRESSINGS) ×2 IMPLANT
GLOVE BIO SURGEON STRL SZ7.5 (GLOVE) ×2 IMPLANT
GLOVE BIOGEL PI IND STRL 6.5 (GLOVE) IMPLANT
GLOVE BIOGEL PI INDICATOR 6.5 (GLOVE) ×1
GLOVE SURG SS PI 7.0 STRL IVOR (GLOVE) ×2 IMPLANT
GOWN STRL REUS W/ TWL LRG LVL3 (GOWN DISPOSABLE) ×2 IMPLANT
GOWN STRL REUS W/ TWL XL LVL3 (GOWN DISPOSABLE) IMPLANT
GOWN STRL REUS W/TWL LRG LVL3 (GOWN DISPOSABLE) ×2
GOWN STRL REUS W/TWL XL LVL3 (GOWN DISPOSABLE) ×4
KIT BASIN OR (CUSTOM PROCEDURE TRAY) ×2 IMPLANT
KIT ROOM TURNOVER OR (KITS) ×2 IMPLANT
LIQUID BAND (GAUZE/BANDAGES/DRESSINGS) ×1 IMPLANT
NDL 18GX1X1/2 (RX/OR ONLY) (NEEDLE) ×1 IMPLANT
NDL HYPO 25GX1X1/2 BEV (NEEDLE) ×1 IMPLANT
NEEDLE 18GX1X1/2 (RX/OR ONLY) (NEEDLE) ×2 IMPLANT
NEEDLE HYPO 25GX1X1/2 BEV (NEEDLE) ×2 IMPLANT
NS IRRIG 1000ML POUR BTL (IV SOLUTION) ×2 IMPLANT
PACK SURGICAL SETUP 50X90 (CUSTOM PROCEDURE TRAY) ×2 IMPLANT
PAD ARMBOARD 7.5X6 YLW CONV (MISCELLANEOUS) ×4 IMPLANT
SET MICROPUNCTURE 5F STIFF (MISCELLANEOUS) IMPLANT
SPONGE GAUZE 2X2 STER 10/PKG (GAUZE/BANDAGES/DRESSINGS) ×1
SUT ETHILON 3 0 PS 1 (SUTURE) ×2 IMPLANT
SUT VICRYL 4-0 PS2 18IN ABS (SUTURE) ×2 IMPLANT
SYR 20CC LL (SYRINGE) ×4 IMPLANT
SYR 5ML LL (SYRINGE) ×2 IMPLANT
SYR CONTROL 10ML LL (SYRINGE) ×2 IMPLANT
SYRINGE 10CC LL (SYRINGE) ×2 IMPLANT
TAPE CLOTH SURG 4X10 WHT LF (GAUZE/BANDAGES/DRESSINGS) ×1 IMPLANT
WATER STERILE IRR 1000ML POUR (IV SOLUTION) ×1 IMPLANT
WIRE AMPLATZ SS-J .035X180CM (WIRE) IMPLANT

## 2014-11-03 NOTE — Progress Notes (Signed)
Rt Note: Pt has continued to refuse bipap/cpap.  Rt will continue to monitor.

## 2014-11-03 NOTE — Anesthesia Postprocedure Evaluation (Signed)
Anesthesia Post Note  Patient: Krista Wise  Procedure(s) Performed: Procedure(s) (LRB): INSERTION OF DIALYSIS CATHETER (Right)  Anesthesia type: MAC  Patient location: PACU  Post pain: Pain level controlled  Post assessment: Patient's Cardiovascular Status Stable  Last Vitals:  Filed Vitals:   11/03/14 0930  BP: 116/59  Pulse: 64  Temp: 36.7 C  Resp: 18    Post vital signs: Reviewed and stable  Level of consciousness: sedated  Complications: No apparent anesthesia complications

## 2014-11-03 NOTE — Progress Notes (Signed)
PT Cancellation Note  Patient Details Name: Theodoro KalataMary O Cowing MRN: [id number] DOB: 1945-09-01   Cancelled Treatment:    Reason Eval/Treat Not Completed: Patient at procedure or test/unavailable.    Conni SlipperKirkman, Karol Skarzynski 11/03/2014, 7:29 AM   Conni SlipperLaura Margrete Delude, PT, DPT Acute Rehabilitation Services Pager: [phone]

## 2014-11-03 NOTE — Op Note (Signed)
Procedure: Ultrasound-guided insertion of Diatek catheter, removal or temporary dialysis catheter  Preoperative diagnosis: End-stage renal disease  Postoperative diagnosis: Same  Anesthesia: Local with IV sedation  Operative findings: 23 cm Diatek catheter right internal jugular vein  Operative details: After obtaining informed consent, the patient was taken to the operating room. The patient was placed in supine position on the operating room table. After adequate sedation the patient's entire neck and chest were prepped and draped in usual sterile fashion. The patient was placed in Trendelenburg position. Ultrasound was used to identify the patient's right internal jugular vein. This had normal compressibility and respiratory variation. Local anesthesia was infiltrated over the right jugular vein.  Using ultrasound guidance, the right internal jugular vein was successfully cannulated after one inadvertent cannulation of the carotid.  Pressure was held over the carotid for 3 minutes to obtain hemostasis.  A 0.035 J-tipped guidewire was threaded into the right internal jugular vein and into the superior vena cava followed by the inferior vena cava under fluoroscopic guidance.   Next sequential 12 and 14 dilators were placed over the guidewire into the right atrium.  A 16 French dilator with a peel-away sheath was then placed over the guidewire into the right atrium.   The guidewire and dilator were removed. A 23 cm Diatek catheter was then placed through the peel away sheath into the right atrium.  The catheter was then tunneled subcutaneously, cut to length, and the hub attached. The catheter was noted to flush and draw easily. The catheter was inspected under fluoroscopy and found with its tip to be in the right atrium without any kinks throughout its course. The catheter was sutured to the skin with nylon sutures. The neck insertion site was closed with Vicryl stitch. The catheter was then loaded with  concentrated Heparin solution. A dry sterile dressing was applied.  The temporary catheter sutures were then removed and the catheter removed and hemostasis obtained with direct pressure for 5 minutes.  The patient tolerated procedure well and there were no complications. Instrument sponge and needle counts were correct at the end of the case. The patient was taken to the recovery room in stable condition. Chest x-ray will be obtained in the recovery room.  Fabienne Brunsharles Othniel Maret, MD [facility] [place] Office: [phone] Pager: [phone]
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
## 2014-11-03 NOTE — Progress Notes (Signed)
Assessment:  1. Acute on CKD 3 presumably due to ischemic ATN. Still no UO 2. DM 3.. Anemia with low iron sats, will give dose of feraheme 4. Low BP on midodrine  Plan: Will evaluate for HD in AM  Subjective: Interval History: Some minor urinary incontinence  Objective: Vital signs in last 24 hours: Temp:  [97.8 F (36.6 Wise)-98.3 F (36.8 Wise)] 98.2 F (36.8 Wise) (04/11 1026) Pulse Rate:  [64-75] 75 (04/11 1026) Resp:  [15-20] 18 (04/11 1026) BP: (83-127)/(37-79) 124/79 mmHg (04/11 1026) SpO2:  [91 %-100 %] 97 % (04/11 1026) Weight:  [94.6 kg (208 lb 8.9 oz)] 94.6 kg (208 lb 8.9 oz) (04/11 0500) Weight change: -1.4 kg (-3 lb 1.4 oz)  Intake/Output from previous day: 04/10 0701 - 04/11 0700 In: 750 [P.O.:700; IV Piggyback:50] Out: -  Intake/Output this shift: Total I/O In: 260 [P.O.:10; I.V.:250] Out: 5 [Blood:5]  General appearance: alert and cooperative Resp: clear to auscultation bilaterally and right chest PC Chest wall: no tenderness Cardio: regular rate and rhythm, S1, S2 normal, no murmur, click, rub or gallop Extremities: extremities normal, atraumatic, no cyanosis or edema  Lab Results:  Recent Labs  11/01/14 0405 11/03/14 0707  WBC 12.1*  --   HGB 9.1* 10.9*  HCT 29.8* 32.0*  PLT 185  --    BMET:  Recent Labs  11/01/14 0405 11/03/14 0707  NA 138 137  K 4.2 4.6  CL 101  --   CO2 24  --   GLUCOSE 232* 197*  BUN 26*  --   CREATININE 4.92*  --   CALCIUM 8.9  --    No results for input(s): PTH in the last 72 hours. Iron Studies: No results for input(s): IRON, TIBC, TRANSFERRIN, FERRITIN in the last 72 hours. Studies/Results: Dg Wrist Complete Left  11/01/2014   CLINICAL DATA:  Left wrist pain starting yesterday  EXAM: LEFT WRIST - COMPLETE 3+ VIEW  COMPARISON:  None.  FINDINGS: Four views of the left wrist submitted. No acute fracture or subluxation. There is narrowing of radiocarpal joint space. Degenerative changes first carpometacarpal joint.   IMPRESSION: No acute fracture or subluxation. Degenerative changes as described above.   Electronically Signed   By: Natasha MeadLiviu  Pop M.D.   On: 11/01/2014 13:01   Dg Chest Port 1 View  11/03/2014   CLINICAL DATA:  New dialysis catheter.  EXAM: PORTABLE CHEST - 1 VIEW  COMPARISON:  October 29, 2014.  FINDINGS: Stable cardiomegaly. No pneumothorax or pleural effusion is noted. No acute pulmonary disease is noted. New right internal jugular dialysis catheter is noted with distal tip overlying the expected position of the SVC. Bony thorax is intact.  IMPRESSION: New right internal jugular dialysis catheter is noted. No pneumothorax is noted. No acute cardiopulmonary abnormality seen.   Electronically Signed   By: Lupita RaiderJames  Green Jr, M.D.   On: 11/03/2014 09:49   Dg Fluoro Guide Cv Line-no Report  11/03/2014   CLINICAL DATA:    FLOURO GUIDE CV LINE  Fluoroscopy was utilized by the requesting physician.  No radiographic  interpretation.    Scheduled: . aspirin  325 mg Oral Daily  . atorvastatin  40 mg Oral q1800  . chlorhexidine  15 mL Mouth Rinse BID  . DULoxetine  60 mg Oral BID  . famotidine  20 mg Oral QHS  . feeding supplement (NEPRO CARB STEADY)  237 mL Oral BID BM  . ferumoxytol  510 mg Intravenous Once  . heparin subcutaneous  5,000  Units Subcutaneous 3 times per day  . insulin aspart  0-20 Units Subcutaneous TID WC  . insulin glargine  11 Units Subcutaneous QHS  . lidocaine  1 patch Transdermal Q24H  . midodrine  5 mg Oral TID WC  . multivitamin  1 tablet Oral QHS  . rOPINIRole  2 mg Oral QHS  . silver sulfADIAZINE   Topical Daily     LOS: 15 days   Krista Wise 11/03/2014,12:49 PM

## 2014-11-03 NOTE — Transfer of Care (Signed)
Immediate Anesthesia Transfer of Care Note  Patient: Krista Wise  Procedure(s) Performed: Procedure(s): INSERTION OF DIALYSIS CATHETER (Right)  Patient Location: PACU  Anesthesia Type:MAC  Level of Consciousness: awake, alert  and oriented  Airway & Oxygen Therapy: Patient Spontanous Breathing and Patient connected to nasal cannula oxygen  Post-op Assessment: Report given to RN and Post -op Vital signs reviewed and stable  Post vital signs: Reviewed and stable  Last Vitals:  Filed Vitals:   11/03/14 0500  BP: 83/37  Pulse: 65  Temp: 36.8 C  Resp: 18    Complications: No apparent anesthesia complications

## 2014-11-03 NOTE — Addendum Note (Signed)
Addendum  created 11/03/14 1127 by Heather RobertsJames Azani Brogdon, MD   Modules edited: Flowsheet VN   Flowsheet VN:  [id number] SCORE

## 2014-11-03 NOTE — Interval H&P Note (Signed)
History and Physical Interval Note:  11/03/2014 7:15 AM  Krista Wise  has presented today for surgery, with the diagnosis of End Stage Renal Disease  N18.6  The various methods of treatment have been discussed with the patient and family. After consideration of risks, benefits and other options for treatment, the patient has consented to  Procedure(s): INSERTION OF DIALYSIS CATHETER (N/A) as a surgical intervention .  The patient's history has been reviewed, patient examined, no change in status, stable for surgery.  I have reviewed the patient's chart and labs.  Questions were answered to the patient's satisfaction.     [name]

## 2014-11-03 NOTE — Anesthesia Preprocedure Evaluation (Addendum)
Anesthesia Evaluation  Patient identified by MRN, date of birth, ID band Patient awake    Reviewed: Allergy & Precautions, H&P , NPO status , Patient's Chart, lab work & pertinent test results, reviewed documented beta blocker date and time   History of Anesthesia Complications Negative for: history of anesthetic complications  Airway Mallampati: III  TM Distance: >3 FB Neck ROM: Full    Dental no notable dental hx. (+) Teeth Intact, Dental Advisory Given,    Pulmonary neg pulmonary ROS, shortness of breath and with exertion, sleep apnea ,  breath sounds clear to auscultation  Pulmonary exam normal       Cardiovascular hypertension, On Medications and On Home Beta Blockers + angina at rest + CAD, + Cardiac Stents and +CHF + Valvular Problems/Murmurs AS Rhythm:Regular Rate:Normal + Systolic murmurs Echo (11/15): Mild LVH, EF 55-60%, no RWMA, Gr 1 DD, mild AI, mild MR, mod LAE.    Neuro/Psych  Headaches, PSYCHIATRIC DISORDERS Depression    GI/Hepatic Neg liver ROS, GERD-  Controlled,  Endo/Other  diabetes, Type 1, Insulin DependentMorbid obesity  Renal/GU ARFRenal disease  negative genitourinary   Musculoskeletal   Abdominal   Peds  Hematology negative hematology ROS (+)   Anesthesia Other Findings   Reproductive/Obstetrics negative OB ROS                            Anesthesia Physical  Anesthesia Plan  ASA: IV  Anesthesia Plan: MAC   Post-op Pain Management:    Induction: Intravenous  Airway Management Planned: Simple Face Mask  Additional Equipment:   Intra-op Plan:   Post-operative Plan:   Informed Consent: I have reviewed the patients History and Physical, chart, labs and discussed the procedure including the risks, benefits and alternatives for the proposed anesthesia with the patient or authorized representative who has indicated his/her understanding and acceptance.    Dental advisory given  Plan Discussed with: CRNA, Anesthesiologist and Surgeon  Anesthesia Plan Comments:        Anesthesia Quick Evaluation
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
## 2014-11-03 NOTE — Progress Notes (Signed)
Inpatient Rehabilitation  We continue to monitor pt. for medical readiness for possible IP Rehab admission.  Please call if questions.  Weldon PickingSusan Sudeep Scheibel PT Inpatient Rehab Admissions Coordinator Cell [phone] Office [phone]

## 2014-11-04 ENCOUNTER — Encounter (HOSPITAL_COMMUNITY): Payer: Self-pay | Admitting: Vascular Surgery

## 2014-11-04 DIAGNOSIS — Z992 Dependence on renal dialysis: Secondary | ICD-10-CM

## 2014-11-04 DIAGNOSIS — N186 End stage renal disease: Secondary | ICD-10-CM

## 2014-11-04 LAB — GLUCOSE, CAPILLARY
GLUCOSE-CAPILLARY: 205 mg/dL — AB (ref 70–99)
Glucose-Capillary: 172 mg/dL — ABNORMAL HIGH (ref 70–99)
Glucose-Capillary: 156 mg/dL — ABNORMAL HIGH (ref 70–99)
Glucose-Capillary: 157 mg/dL — ABNORMAL HIGH (ref 70–99)

## 2014-11-04 MED ORDER — PREDNISONE 20 MG PO TABS
40.0000 mg | ORAL_TABLET | Freq: Every day | ORAL | Status: DC
  Administered 2014-11-05 – 2014-11-08 (×4): 40 mg via ORAL
  Filled 2014-11-04 (×8): qty 2

## 2014-11-04 NOTE — Progress Notes (Signed)
Assessment:  1. Acute on CKD 3 presumably due to ischemic ATN. Still no UO 2. DM 3.. Anemia  4. Low BP on midodrine  Plan: Will evaluate for HD in AM.  No dialysis today.  Subjective: Interval History: none, no uremic complaints, some dribbling/incontinence  Objective: Vital signs in last 24 hours: Temp:  [97.2 F (36.2 Wise)-98.2 F (36.8 Wise)] 98.2 F (36.8 Wise) (04/12 0848) Pulse Rate:  [66-81] 71 (04/12 0848) Resp:  [16-18] 17 (04/12 0848) BP: (112-145)/(52-61) 112/55 mmHg (04/12 0848) SpO2:  [94 %-98 %] 98 % (04/12 0848) Weight:  [96.934 kg (213 lb 11.2 oz)] 96.934 kg (213 lb 11.2 oz) (04/11 1926) Weight change: 2.334 kg (5 lb 2.3 oz)  Intake/Output from previous day: 04/11 0701 - 04/12 0700 In: 920 [P.O.:670; I.V.:250] Out: 6 [Urine:1; Blood:5] Intake/Output this shift: Total I/O In: 299 [P.O.:299] Out: 0   Exam unchanged  Lab Results:  Recent Labs  11/03/14 0707 11/03/14 1837  WBC  --  10.4  HGB 10.9* 9.3*  HCT 32.0* 31.0*  PLT  --  227   BMET:  Recent Labs  11/03/14 0707 11/03/14 1837  NA 137 135  K 4.6 4.4  CL  --  97  CO2  --  25  GLUCOSE 197* 155*  BUN  --  38*  CREATININE  --  6.24*  CALCIUM  --  9.1   No results for input(s): PTH in the last 72 hours. Iron Studies: No results for input(s): IRON, TIBC, TRANSFERRIN, FERRITIN in the last 72 hours. Studies/Results: Dg Chest Port 1 View  11/03/2014   CLINICAL DATA:  New dialysis catheter.  EXAM: PORTABLE CHEST - 1 VIEW  COMPARISON:  October 29, 2014.  FINDINGS: Stable cardiomegaly. No pneumothorax or pleural effusion is noted. No acute pulmonary disease is noted. New right internal jugular dialysis catheter is noted with distal tip overlying the expected position of the SVC. Bony thorax is intact.  IMPRESSION: New right internal jugular dialysis catheter is noted. No pneumothorax is noted. No acute cardiopulmonary abnormality seen.   Electronically Signed   By: Lupita RaiderJames  Green Jr, M.D.   On: 11/03/2014  09:49   Dg Fluoro Guide Cv Line-no Report  11/03/2014   CLINICAL DATA:    FLOURO GUIDE CV LINE  Fluoroscopy was utilized by the requesting physician.  No radiographic  interpretation.    Scheduled: . aspirin  325 mg Oral Daily  . atorvastatin  40 mg Oral q1800  . chlorhexidine  15 mL Mouth Rinse BID  . DULoxetine  60 mg Oral BID  . famotidine  20 mg Oral QHS  . feeding supplement (NEPRO CARB STEADY)  237 mL Oral BID BM  . ferumoxytol  510 mg Intravenous Once  . heparin subcutaneous  5,000 Units Subcutaneous 3 times per day  . insulin aspart  0-20 Units Subcutaneous TID WC  . insulin glargine  11 Units Subcutaneous QHS  . lidocaine  1 patch Transdermal Q24H  . midodrine  5 mg Oral TID WC  . multivitamin  1 tablet Oral QHS  . [START ON 11/05/2014] predniSONE  40 mg Oral Q breakfast  . rOPINIRole  2 mg Oral QHS  . silver sulfADIAZINE   Topical Daily     LOS: 16 days   Krista Wise 11/04/2014,2:00 PM
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
## 2014-11-04 NOTE — Progress Notes (Signed)
Dsg change to left foot, cleaned with sterile water and applied silvadine creme to toes and top of foot. Yellow and sanguinous dng noted. No foul odor, but toes and top of foot is pink and raw in appearance. Applied non-adhesive dressing and wrapped with guaze.
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
## 2014-11-05 LAB — BASIC METABOLIC PANEL
Anion gap: 16 — ABNORMAL HIGH (ref 5–15)
BUN: 57 mg/dL — ABNORMAL HIGH (ref 6–23)
CO2: 19 mmol/L (ref 19–32)
Calcium: 9.2 mg/dL (ref 8.4–10.5)
Chloride: 100 mmol/L (ref 96–112)
Creatinine, Ser: 7.26 mg/dL — ABNORMAL HIGH (ref 0.50–1.10)
GFR calc Af Amer: 6 mL/min — ABNORMAL LOW (ref >90)
GFR calc non Af Amer: 5 mL/min — ABNORMAL LOW (ref >90)
GLUCOSE: 225 mg/dL — AB (ref 70–99)
POTASSIUM: 4.8 mmol/L (ref 3.5–5.1)
Sodium: 135 mmol/L (ref 135–145)

## 2014-11-05 LAB — GLUCOSE, CAPILLARY
GLUCOSE-CAPILLARY: 193 mg/dL — AB (ref 70–99)
GLUCOSE-CAPILLARY: 248 mg/dL — AB (ref 70–99)
GLUCOSE-CAPILLARY: 275 mg/dL — AB (ref 70–99)
Glucose-Capillary: 271 mg/dL — ABNORMAL HIGH (ref 70–99)

## 2014-11-05 MED ORDER — ONDANSETRON HCL 4 MG/2ML IJ SOLN
4.0000 mg | Freq: Four times a day (QID) | INTRAMUSCULAR | Status: DC | PRN
  Administered 2014-11-05 – 2014-11-11 (×3): 4 mg via INTRAVENOUS
  Filled 2014-11-05 (×3): qty 2

## 2014-11-05 MED ORDER — BOOST / RESOURCE BREEZE PO LIQD
1.0000 | Freq: Every day | ORAL | Status: DC
  Administered 2014-11-05 – 2014-11-06 (×2): 1 via ORAL

## 2014-11-05 NOTE — Progress Notes (Signed)
Foot ok F/u duda next week
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
## 2014-11-05 NOTE — Progress Notes (Signed)
I met with pt, her spouse, Mom, sister and grand daughter at bedside. Pt looks very well today. I continue to follow her progress to assist with dispo pending stability of her medical issues. Last HD 4/9. I will continue to follow . [phone]

## 2014-11-05 NOTE — Progress Notes (Signed)
Assessment:  1. Acute on CKD 3 presumably due to ischemic ATN. Still minimal  UO 2. DM 3.. Anemia  4. Low BP on midodrine  Plan: Will evaluate for HD again in AM. No dialysis today.  Subjective: Interval History: minimal voiding  Objective: Vital signs in last 24 hours: Temp:  [98 F (36.7 C)-98.4 F (36.9 C)] 98 F (36.7 C) (04/13 1000) Pulse Rate:  [69-85] 76 (04/13 1000) Resp:  [17-18] 18 (04/13 1000) BP: (72-142)/(45-65) 122/54 mmHg (04/13 1000) SpO2:  [94 %-95 %] 95 % (04/13 1000) Weight:  [97.2 kg (214 lb 4.6 oz)] 97.2 kg (214 lb 4.6 oz) (04/12 2149) Weight change: 0.266 kg (9.4 oz)  Intake/Output from previous day: 04/12 0701 - 04/13 0700 In: 1379 [P.O.:1379] Out: 1 [Stool:1] Intake/Output this shift: Total I/O In: 240 [P.O.:240] Out: -   General appearance: alert and cooperative Resp: clear to auscultation bilaterally Cardio: regular rate and rhythm, S1, S2 normal, no murmur, click, rub or gallop Extremities: extremities normal, atraumatic, no cyanosis or edema  Lab Results:  Recent Labs  11/03/14 0707 11/03/14 1837  WBC  --  10.4  HGB 10.9* 9.3*  HCT 32.0* 31.0*  PLT  --  227   BMET:  Recent Labs  11/03/14 1837 11/05/14 0418  NA 135 135  K 4.4 4.8  CL 97 100  CO2 25 19  GLUCOSE 155* 225*  BUN 38* 57*  CREATININE 6.24* 7.26*  CALCIUM 9.1 9.2   No results for input(s): PTH in the last 72 hours. Iron Studies: No results for input(s): IRON, TIBC, TRANSFERRIN, FERRITIN in the last 72 hours. Studies/Results: No results found.  Scheduled: . aspirin  325 mg Oral Daily  . atorvastatin  40 mg Oral q1800  . chlorhexidine  15 mL Mouth Rinse BID  . DULoxetine  60 mg Oral BID  . famotidine  20 mg Oral QHS  . feeding supplement (NEPRO CARB STEADY)  237 mL Oral BID BM  . ferumoxytol  510 mg Intravenous Once  . heparin subcutaneous  5,000 Units Subcutaneous 3 times per day  . insulin aspart  0-20 Units Subcutaneous TID WC  . insulin glargine   11 Units Subcutaneous QHS  . lidocaine  1 patch Transdermal Q24H  . midodrine  5 mg Oral TID WC  . multivitamin  1 tablet Oral QHS  . predniSONE  40 mg Oral Q breakfast  . rOPINIRole  2 mg Oral QHS  . silver sulfADIAZINE   Topical Daily     LOS: 17 days   Kylin Genna C 11/05/2014,2:24 PM
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
## 2014-11-05 NOTE — Progress Notes (Signed)
Patient had 5 beat run of ventricular tachycardia. Asymptomatic. Notified on-call.  Will continue to monitor.

## 2014-11-05 NOTE — Progress Notes (Signed)
Inpatient Diabetes Program Recommendations  AACE/ADA: New Consensus Statement on Inpatient Glycemic Control (2013)  Target Ranges:  Prepandial:   less than 140 mg/dL      Peak postprandial:   less than 180 mg/dL (1-2 hours)      Critically ill patients:  140 - 180 mg/dL   Reason for Visit: Hyperglycemia  Results for Krista Wise, Krista Wise (MRN [id number]) as of 11/05/2014 12:16  Ref. Range 10/31/2014 05:20 11/01/2014 04:05 11/03/2014 07:07 11/03/2014 18:37 11/05/2014 04:18  Glucose Latest Ref Range: 70-99 mg/dL 132208 (H) 440232 (H) 102197 (H) 155 (H) 225 (H)    Inpatient Diabetes Program Recommendations Insulin - Basal: Increase Lantus to 16 units Correction (SSI): Add HS coverage HgbA1C: 7.6% -   Note: Will continue to follow.Thank you. Ailene Ardshonda Emil Weigold, RD, LDN, CDE Inpatient Diabetes Coordinator [phone]
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
## 2014-11-05 NOTE — Care Management Note (Signed)
    Page 1 of 1   11/05/2014     11:26:13 AM CARE MANAGEMENT NOTE 11/05/2014  Patient:  Krista Wise, Krista Wise   Account Number:  [id number]  Date Initiated:  10/20/2014  Documentation initiated by:  MAYO,HENRIETTA  Subjective/Objective Assessment:   dx resp failure/hypotension; lives with spouse, has home O2    PCP  Janeann Merl     Action/Plan:   10/31/14 Met with pt ,plan is for possible CIR. CIR following.   Anticipated DC Date:  11/06/2014   Anticipated DC Plan:  IP REHAB FACILITY      DC Planning Services  CM consult      Choice offered to / List presented to:             Status of service:  In process, will continue to follow Medicare Important Message given?  YES (If response is "NO", the following Medicare IM given date fields will be blank) Date Medicare IM given:  10/23/2014 Medicare IM given by:  MAYO,HENRIETTA Date Additional Medicare IM given:  11/05/2014 Additional Medicare IM given by:  Bexley Mclester  Discharge Disposition:    Per UR Regulation:  Reviewed for med. necessity/level of care/duration of stay  If discussed at [place] of Stay Meetings, dates discussed:    Comments:  10/31/2014 CIR following for possible adm. Vein Mapping today.  CRoyal RN MPH, case manager, [phone]  10/27/14 [place] RN MSN BSN CCM Transferred to [place].  PT recommending CIR if pt progresses. Pt has been basically bed to chair for several years, spouse is primary caregiver, supplements with paid caregivers.

## 2014-11-06 DIAGNOSIS — I951 Orthostatic hypotension: Secondary | ICD-10-CM

## 2014-11-06 DIAGNOSIS — N183 Chronic kidney disease, stage 3 (moderate): Secondary | ICD-10-CM

## 2014-11-06 LAB — RENAL FUNCTION PANEL
ANION GAP: 15 (ref 5–15)
Albumin: 2.5 g/dL — ABNORMAL LOW (ref 3.5–5.2)
BUN: 64 mg/dL — ABNORMAL HIGH (ref 6–23)
CHLORIDE: 101 mmol/L (ref 96–112)
CO2: 22 mmol/L (ref 19–32)
CREATININE: 6.79 mg/dL — AB (ref 0.50–1.10)
Calcium: 9.2 mg/dL (ref 8.4–10.5)
GFR calc Af Amer: 7 mL/min — ABNORMAL LOW (ref >90)
GFR calc non Af Amer: 6 mL/min — ABNORMAL LOW (ref >90)
Glucose, Bld: 192 mg/dL — ABNORMAL HIGH (ref 70–99)
Phosphorus: 7 mg/dL — ABNORMAL HIGH (ref 2.3–4.6)
Potassium: 4.6 mmol/L (ref 3.5–5.1)
Sodium: 138 mmol/L (ref 135–145)

## 2014-11-06 LAB — CBC
HCT: 29 % — ABNORMAL LOW (ref 36.0–46.0)
Hemoglobin: 9.2 g/dL — ABNORMAL LOW (ref 12.0–15.0)
MCH: 26.3 pg (ref 26.0–34.0)
MCHC: 31.7 g/dL (ref 30.0–36.0)
MCV: 82.9 fL (ref 78.0–100.0)
Platelets: 250 10*3/uL (ref 150–400)
RBC: 3.5 MIL/uL — AB (ref 3.87–5.11)
RDW: 19.3 % — ABNORMAL HIGH (ref 11.5–15.5)
WBC: 9.2 10*3/uL (ref 4.0–10.5)

## 2014-11-06 LAB — GLUCOSE, CAPILLARY
GLUCOSE-CAPILLARY: 260 mg/dL — AB (ref 70–99)
Glucose-Capillary: 179 mg/dL — ABNORMAL HIGH (ref 70–99)
Glucose-Capillary: 198 mg/dL — ABNORMAL HIGH (ref 70–99)
Glucose-Capillary: 273 mg/dL — ABNORMAL HIGH (ref 70–99)

## 2014-11-06 LAB — TROPONIN I
TROPONIN I: 0.1 ng/mL — AB (ref <0.031)
TROPONIN I: 0.08 ng/mL — AB (ref <0.031)

## 2014-11-06 MED ORDER — SODIUM CHLORIDE 0.9 % IV SOLN
INTRAVENOUS | Status: AC
  Administered 2014-11-06: 13:00:00 via INTRAVENOUS

## 2014-11-06 NOTE — Progress Notes (Signed)
I continue to follow pt's progress with therapy as we wait for stabilization of renal issues. [phone]

## 2014-11-06 NOTE — Progress Notes (Signed)
Pt refuses BIPAP QHS, RT to monitor and assess as needed.  
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
## 2014-11-06 NOTE — Progress Notes (Signed)
Assessment:  1. Acute on CKD 3 presumably due to ischemic ATN. Increased UOP 2. DM 3.. Anemia  4. Low BP on midodrine 5 Orthostatic  Plan: IVFs  Subjective: Interval History: c/o dizziness Objective: Vital signs in last 24 hours: Temp:  [97.6 F (36.4 C)-98 F (36.7 C)] 98 F (36.7 C) (04/14 1000) Pulse Rate:  [67-80] 78 (04/14 1000) Resp:  [17-18] 18 (04/14 1000) BP: (110-124)/(42-57) 124/57 mmHg (04/14 1000) SpO2:  [96 %-98 %] 96 % (04/14 1000) Weight change:   Intake/Output from previous day: 04/13 0701 - 04/14 0700 In: 600 [P.O.:600] Out: 200 [Urine:200] Intake/Output this shift: Total I/O In: 120 [P.O.:120] Out: 0   General appearance: alert and cooperative Resp: clear to auscultation bilaterally Cardio: regular rate and rhythm, S1, S2 normal, no murmur, click, rub or gallop Extremities: edema 1  Lab Results:  Recent Labs  11/03/14 1837 11/06/14 0641  WBC 10.4 9.2  HGB 9.3* 9.2*  HCT 31.0* 29.0*  PLT 227 250   BMET:  Recent Labs  11/05/14 0418 11/06/14 0641  NA 135 138  K 4.8 4.6  CL 100 101  CO2 19 22  GLUCOSE 225* 192*  BUN 57* 64*  CREATININE 7.26* 6.79*  CALCIUM 9.2 9.2   No results for input(s): PTH in the last 72 hours. Iron Studies: No results for input(s): IRON, TIBC, TRANSFERRIN, FERRITIN in the last 72 hours. Studies/Results: No results found.  Scheduled: . aspirin  325 mg Oral Daily  . atorvastatin  40 mg Oral q1800  . chlorhexidine  15 mL Mouth Rinse BID  . DULoxetine  60 mg Oral BID  . famotidine  20 mg Oral QHS  . feeding supplement (RESOURCE BREEZE)  1 Container Oral Q1500  . ferumoxytol  510 mg Intravenous Once  . heparin subcutaneous  5,000 Units Subcutaneous 3 times per day  . insulin aspart  0-20 Units Subcutaneous TID WC  . insulin glargine  11 Units Subcutaneous QHS  . lidocaine  1 patch Transdermal Q24H  . midodrine  5 mg Oral TID WC  . multivitamin  1 tablet Oral QHS  . predniSONE  40 mg Oral Q breakfast   . rOPINIRole  2 mg Oral QHS  . silver sulfADIAZINE   Topical Daily      LOS: 18 days   Lalania Haseman C 11/06/2014,12:21 PM
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
## 2014-11-06 NOTE — Progress Notes (Signed)
Patient refused CPAP at this time. Patient encouraged to call for Respiratory if CPAP desired during hospital stay.  

## 2014-11-07 ENCOUNTER — Inpatient Hospital Stay (HOSPITAL_COMMUNITY): Payer: Medicare Other

## 2014-11-07 DIAGNOSIS — I214 Non-ST elevation (NSTEMI) myocardial infarction: Secondary | ICD-10-CM

## 2014-11-07 DIAGNOSIS — N178 Other acute kidney failure: Secondary | ICD-10-CM

## 2014-11-07 LAB — RENAL FUNCTION PANEL
ALBUMIN: 2.7 g/dL — AB (ref 3.5–5.2)
Anion gap: 16 — ABNORMAL HIGH (ref 5–15)
BUN: 72 mg/dL — ABNORMAL HIGH (ref 6–23)
CALCIUM: 9.2 mg/dL (ref 8.4–10.5)
CHLORIDE: 100 mmol/L (ref 96–112)
CO2: 20 mmol/L (ref 19–32)
CREATININE: 6.39 mg/dL — AB (ref 0.50–1.10)
GFR calc non Af Amer: 6 mL/min — ABNORMAL LOW (ref >90)
GFR, EST AFRICAN AMERICAN: 7 mL/min — AB (ref >90)
Glucose, Bld: 257 mg/dL — ABNORMAL HIGH (ref 70–99)
Phosphorus: 7.3 mg/dL — ABNORMAL HIGH (ref 2.3–4.6)
Potassium: 5.1 mmol/L (ref 3.5–5.1)
SODIUM: 136 mmol/L (ref 135–145)

## 2014-11-07 LAB — GLUCOSE, CAPILLARY
GLUCOSE-CAPILLARY: 238 mg/dL — AB (ref 70–99)
GLUCOSE-CAPILLARY: 218 mg/dL — AB (ref 70–99)
Glucose-Capillary: 210 mg/dL — ABNORMAL HIGH (ref 70–99)

## 2014-11-07 LAB — TROPONIN I: Troponin I: 0.08 ng/mL — ABNORMAL HIGH (ref <0.031)

## 2014-11-07 MED ORDER — SODIUM CHLORIDE 0.9 % IV SOLN
INTRAVENOUS | Status: DC
  Administered 2014-11-07: 13:00:00 via INTRAVENOUS

## 2014-11-07 MED ORDER — FUROSEMIDE 10 MG/ML IJ SOLN
160.0000 mg | Freq: Once | INTRAMUSCULAR | Status: AC
  Administered 2014-11-07: 160 mg via INTRAVENOUS
  Filled 2014-11-07: qty 16

## 2014-11-07 MED ORDER — INSULIN GLARGINE 100 UNIT/ML ~~LOC~~ SOLN
11.0000 [IU] | Freq: Every day | SUBCUTANEOUS | Status: DC
  Administered 2014-11-07 – 2014-11-11 (×5): 11 [IU] via SUBCUTANEOUS
  Filled 2014-11-07 (×6): qty 0.11

## 2014-11-07 MED ORDER — PANTOPRAZOLE SODIUM 40 MG IV SOLR
40.0000 mg | Freq: Two times a day (BID) | INTRAVENOUS | Status: DC
  Administered 2014-11-07 – 2014-11-09 (×6): 40 mg via INTRAVENOUS
  Filled 2014-11-07 (×8): qty 40

## 2014-11-07 NOTE — Progress Notes (Signed)
Inpatient Diabetes Program Recommendations  AACE/ADA: New Consensus Statement on Inpatient Glycemic Control (2013)  Target Ranges:  Prepandial:   less than 140 mg/dL      Peak postprandial:   less than 180 mg/dL (1-2 hours)      Critically ill patients:  140 - 180 mg/dL   Reason for Visit: Hyperglycemia  Results for [name], Oline O (MRN [id number]) as of 11/07/2014 18:04  Ref. Range 11/06/2014 16:31 11/06/2014 22:40 11/07/2014 07:37 11/07/2014 11:27 11/07/2014 16:34  Glucose-Capillary Latest Ref Range: 70-99 mg/dL 536260 (H) 644273 (H) 034238 (H) 210 (H) 218 (H)   Uncontrolled blood sugars. Needs insulin adjustments.   Consider increasing Lantus to 16 units QHS Add HS correction.  Will continue to follow. Thank you. Ailene Ardshonda Malyia Moro, RD, LDN, CDE Inpatient Diabetes Coordinator [phone]

## 2014-11-07 NOTE — Progress Notes (Signed)
Notified of routine low O2 sat.  Pt without complaints and exam unremarkable. Getting IVFs due to orthostasis and significant fluid removal during the hospitalization and early recovery of renal fct.  CXR reveals CHF.  WIll stop IVFs and give dose of furosemide. Greogry Goodwyn C
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
## 2014-11-07 NOTE — Care Management Note (Signed)
CARE MANAGEMENT NOTE 11/07/2014  Patient:  Krista Wise, Krista Wise   Account Number:  [id number]  Date Initiated:  10/20/2014  Documentation initiated by:  MAYO,HENRIETTA  Subjective/Objective Assessment:   dx resp failure/hypotension; lives with spouse, has home O2    PCP  Janeann Merl     Action/Plan:   10/31/14 Met with pt ,plan is for possible CIR. CIR following.   Anticipated DC Date:  11/10/2014   Anticipated DC Plan:  IP REHAB FACILITY      DC Planning Services  CM consult      Choice offered to / List presented to:             Status of service:  In process, will continue to follow Medicare Important Message given?  YES (If response is "NO", the following Medicare IM given date fields will be blank) Date Medicare IM given:  10/23/2014 Medicare IM given by:  MAYO,HENRIETTA Date Additional Medicare IM given:  11/05/2014 Additional Medicare IM given by:  JULIE AMERSON  Discharge Disposition:    Per UR Regulation:  Reviewed for med. necessity/level of care/duration of stay  If discussed at [place] of Stay Meetings, dates discussed:    Comments:  11/07/2014 IM given, plan to continue to await renal recovery.  CRoyal RN MPH, case manager, [phone]  10/31/2014 CIR following for possible adm. Vein Mapping today.  CRoyal RN MPH, case manager, [phone]  10/27/14 [place] RN MSN BSN CCM Transferred to [place].  PT recommending CIR if pt progresses. Pt has been basically bed to chair for several years, spouse is primary caregiver, supplements with paid caregivers.

## 2014-11-07 NOTE — Progress Notes (Signed)
Assessment:  1. Acute on CKD 3 presumably due to ischemic ATN. Increased UOP & falling creat 2. DM 3.. Anemia  4. Low BP on midodrine 5 Orthostatic  Plan: IVFs  Subjective: Interval History: Incontinent of urine. Did not get continuous fluids.  Objective: Vital signs in last 24 hours: Temp:  [97.7 F (36.5 C)-98.2 F (36.8 C)] 97.8 F (36.6 C) (04/15 0449) Pulse Rate:  [70-82] 82 (04/15 0449) Resp:  [16-18] 16 (04/15 0449) BP: (121-133)/(54-64) 133/64 mmHg (04/15 0449) SpO2:  [94 %-98 %] 94 % (04/15 0449) Weight:  [94.847 kg (209 lb 1.6 oz)] 94.847 kg (209 lb 1.6 oz) (04/14 2243) Weight change:   Intake/Output from previous day: 04/14 0701 - 04/15 0700 In: 360 [P.O.:360] Out: 110 [Urine:110] Intake/Output this shift:    General appearance: resting currentl comfortable  No signif LE edema  Lab Results:  Recent Labs  11/06/14 0641  WBC 9.2  HGB 9.2*  HCT 29.0*  PLT 250   BMET:  Recent Labs  11/06/14 0641 11/07/14 0440  NA 138 136  K 4.6 5.1  CL 101 100  CO2 22 20  GLUCOSE 192* 257*  BUN 64* 72*  CREATININE 6.79* 6.39*  CALCIUM 9.2 9.2   No results for input(s): PTH in the last 72 hours. Iron Studies: No results for input(s): IRON, TIBC, TRANSFERRIN, FERRITIN in the last 72 hours. Studies/Results: Mr Brain Wo Contrast  11/07/2014   CLINICAL DATA:  Four day history of vomiting, diarrhea and weakness. Dizziness.  EXAM: MRI HEAD WITHOUT CONTRAST  TECHNIQUE: Multiplanar, multiecho pulse sequences of the brain and surrounding structures were obtained without intravenous contrast.  COMPARISON:  MRA intracranial 10/1913.  CT head 02/07/2012.  FINDINGS: No evidence for acute infarction, hemorrhage, mass lesion, hydrocephalus, or extra-axial fluid. Moderate cerebral and cerebellar atrophy, premature for age. Minimal white matter disease. Flow voids are maintained throughout the carotid, basilar, and vertebral arteries. There are no areas of chronic hemorrhage.  Pituitary, pineal, and cerebellar tonsils unremarkable. No upper cervical lesions. BILATERAL cataract extraction. No sinus or mastoid disease. Extracranial soft tissues otherwise unremarkable.  Compared with prior CT, further cerebral volume loss has occurred.  IMPRESSION: Moderate cerebral and cerebellar atrophy progressive from 2013.  No acute intracranial findings.   Electronically Signed   By: Davonna BellingJohn  Curnes M.D.   On: 11/07/2014 09:54    Scheduled: . aspirin  325 mg Oral Daily  . atorvastatin  40 mg Oral q1800  . chlorhexidine  15 mL Mouth Rinse BID  . DULoxetine  60 mg Oral BID  . famotidine  20 mg Oral QHS  . feeding supplement (RESOURCE BREEZE)  1 Container Oral Q1500  . ferumoxytol  510 mg Intravenous Once  . heparin subcutaneous  5,000 Units Subcutaneous 3 times per day  . insulin aspart  0-20 Units Subcutaneous TID WC  . insulin glargine  11 Units Subcutaneous QHS  . lidocaine  1 patch Transdermal Q24H  . midodrine  5 mg Oral TID WC  . multivitamin  1 tablet Oral QHS  . predniSONE  40 mg Oral Q breakfast  . rOPINIRole  2 mg Oral QHS  . silver sulfADIAZINE   Topical Daily     LOS: 19 days   Kemper Hochman C 11/07/2014,12:16 PM

## 2014-11-07 NOTE — Progress Notes (Signed)
Patient refuses BIPAP QHS. RT will continue to monitor.

## 2014-11-08 DIAGNOSIS — R109 Unspecified abdominal pain: Secondary | ICD-10-CM | POA: Insufficient documentation

## 2014-11-08 DIAGNOSIS — N170 Acute kidney failure with tubular necrosis: Secondary | ICD-10-CM

## 2014-11-08 DIAGNOSIS — T25322D Burn of third degree of left foot, subsequent encounter: Secondary | ICD-10-CM

## 2014-11-08 DIAGNOSIS — I214 Non-ST elevation (NSTEMI) myocardial infarction: Secondary | ICD-10-CM

## 2014-11-08 DIAGNOSIS — R1013 Epigastric pain: Secondary | ICD-10-CM

## 2014-11-08 LAB — GLUCOSE, CAPILLARY
Glucose-Capillary: 205 mg/dL — ABNORMAL HIGH (ref 70–99)
Glucose-Capillary: 161 mg/dL — ABNORMAL HIGH (ref 70–99)
Glucose-Capillary: 185 mg/dL — ABNORMAL HIGH (ref 70–99)
Glucose-Capillary: 283 mg/dL — ABNORMAL HIGH (ref 70–99)
Glucose-Capillary: 241 mg/dL — ABNORMAL HIGH (ref 70–99)

## 2014-11-08 LAB — BASIC METABOLIC PANEL
ANION GAP: 19 — AB (ref 5–15)
BUN: 76 mg/dL — ABNORMAL HIGH (ref 6–23)
CHLORIDE: 103 mmol/L (ref 96–112)
CO2: 18 mmol/L — ABNORMAL LOW (ref 19–32)
Calcium: 9.5 mg/dL (ref 8.4–10.5)
Creatinine, Ser: 5.61 mg/dL — ABNORMAL HIGH (ref 0.50–1.10)
GFR calc Af Amer: 8 mL/min — ABNORMAL LOW (ref >90)
GFR calc non Af Amer: 7 mL/min — ABNORMAL LOW (ref >90)
Glucose, Bld: 168 mg/dL — ABNORMAL HIGH (ref 70–99)
POTASSIUM: 5 mmol/L (ref 3.5–5.1)
SODIUM: 140 mmol/L (ref 135–145)

## 2014-11-08 LAB — POCT I-STAT 3, ART BLOOD GAS (G3+)
Acid-base deficit: 6 mmol/L — ABNORMAL HIGH (ref 0.0–2.0)
Bicarbonate: 19.8 mEq/L — ABNORMAL LOW (ref 20.0–24.0)
O2 Saturation: 94 %
PCO2 ART: 38.8 mmHg (ref 35.0–45.0)
TCO2: 21 mmol/L (ref 0–100)
pH, Arterial: 7.316 — ABNORMAL LOW (ref 7.350–7.450)
pO2, Arterial: 79 mmHg — ABNORMAL LOW (ref 80.0–100.0)

## 2014-11-08 LAB — CBC
HCT: 30.3 % — ABNORMAL LOW (ref 36.0–46.0)
Hemoglobin: 9.4 g/dL — ABNORMAL LOW (ref 12.0–15.0)
MCH: 25.6 pg — AB (ref 26.0–34.0)
MCHC: 31 g/dL (ref 30.0–36.0)
MCV: 82.6 fL (ref 78.0–100.0)
Platelets: 273 10*3/uL (ref 150–400)
RBC: 3.67 MIL/uL — ABNORMAL LOW (ref 3.87–5.11)
RDW: 19.3 % — AB (ref 11.5–15.5)
WBC: 9.3 10*3/uL (ref 4.0–10.5)

## 2014-11-08 LAB — HEPATIC FUNCTION PANEL
ALT: <5 U/L (ref 0–35)
AST: 17 U/L (ref 0–37)
Albumin: 2.6 g/dL — ABNORMAL LOW (ref 3.5–5.2)
Alkaline Phosphatase: 75 U/L (ref 39–117)
BILIRUBIN TOTAL: 0.6 mg/dL (ref 0.3–1.2)
TOTAL PROTEIN: 7.1 g/dL (ref 6.0–8.3)

## 2014-11-08 LAB — AMMONIA: Ammonia: 21 umol/L (ref 11–32)

## 2014-11-08 LAB — TSH: TSH: 3.186 u[IU]/mL (ref 0.350–4.500)

## 2014-11-08 MED ORDER — BOOST / RESOURCE BREEZE PO LIQD
1.0000 | Freq: Two times a day (BID) | ORAL | Status: DC
  Administered 2014-11-08 – 2014-11-09 (×2): 1 via ORAL

## 2014-11-08 MED ORDER — SODIUM BICARBONATE 650 MG PO TABS
650.0000 mg | ORAL_TABLET | Freq: Two times a day (BID) | ORAL | Status: DC
  Administered 2014-11-08 – 2014-11-14 (×13): 650 mg via ORAL
  Filled 2014-11-08 (×14): qty 1

## 2014-11-08 MED ORDER — INSULIN ASPART 100 UNIT/ML ~~LOC~~ SOLN
0.0000 [IU] | Freq: Three times a day (TID) | SUBCUTANEOUS | Status: DC
  Administered 2014-11-09: 2 [IU] via SUBCUTANEOUS
  Administered 2014-11-09 (×2): 3 [IU] via SUBCUTANEOUS
  Administered 2014-11-10: 5 [IU] via SUBCUTANEOUS
  Administered 2014-11-10: 3 [IU] via SUBCUTANEOUS
  Administered 2014-11-10: 2 [IU] via SUBCUTANEOUS
  Administered 2014-11-11: 7 [IU] via SUBCUTANEOUS
  Administered 2014-11-11 – 2014-11-12 (×3): 3 [IU] via SUBCUTANEOUS
  Administered 2014-11-13: 2 [IU] via SUBCUTANEOUS
  Administered 2014-11-13: 9 [IU] via SUBCUTANEOUS
  Administered 2014-11-13 – 2014-11-14 (×2): 3 [IU] via SUBCUTANEOUS
  Administered 2014-11-14: 2 [IU] via SUBCUTANEOUS

## 2014-11-08 MED ORDER — PREDNISONE 20 MG PO TABS
20.0000 mg | ORAL_TABLET | Freq: Every day | ORAL | Status: DC
  Administered 2014-11-09 – 2014-11-10 (×2): 20 mg via ORAL
  Filled 2014-11-08 (×3): qty 1

## 2014-11-08 NOTE — Progress Notes (Signed)
    Subjective:  Denies CP or dyspnea   Objective:  Filed Vitals:   11/07/14 1641 11/07/14 1646 11/07/14 2145 11/08/14 0431  BP: 138/82  147/74 130/69  Pulse: 82  79 80  Temp: 97.7 F (36.5 C)  97.5 F (36.4 C) 97.5 F (36.4 C)  TempSrc: Oral  Oral Oral  Resp: 17  16 17   Height:      Weight:   209 lb 8 oz (95.029 kg)   SpO2: 84% 97% 99% 99%    Intake/Output from previous day:  Intake/Output Summary (Last 24 hours) at 11/08/14 0827 Last data filed at 11/08/14 0600  Gross per 24 hour  Intake    480 ml  Output    300 ml  Net    180 ml    Physical Exam: Physical exam: Well-developed chronically ill appearing in no acute distress.  Skin is warm and dry.  HEENT is normal.  Neck is supple.  Chest is clear to auscultation with normal expansion.  Cardiovascular exam is regular rate and rhythm.  Abdominal exam nontender or distended. No masses palpated. Extremities show no edema. neuro grossly intact    Lab Results: Basic Metabolic Panel:  Recent Labs  16/04/9603/14/16 0641 11/07/14 0440 11/08/14 0425  NA 138 136 140  K 4.6 5.1 5.0  CL 101 100 103  CO2 22 20 18*  GLUCOSE 192* 257* 168*  BUN 64* 72* 76*  CREATININE 6.79* 6.39* 5.61*  CALCIUM 9.2 9.2 9.5  PHOS 7.0* 7.3*  --    CBC:  Recent Labs  11/06/14 0641 11/08/14 0425  WBC 9.2 9.3  HGB 9.2* 9.4*  HCT 29.0* 30.3*  MCV 82.9 82.6  PLT 250 273   Cardiac Enzymes:  Recent Labs  11/06/14 1241 11/06/14 1800 11/07/14 0440  TROPONINI 0.10* 0.08* 0.08*     Assessment/Plan:  1 non-ST elevation myocardial infarction-occurred in the setting of present illness. Continue aspirin and statin. Blood pressure is much improved. Would wean midodrine if possible. If blood pressure remains stable would then add low-dose metoprolol. She will ultimately require an ischemia evaluation. She would not be a good candidate for cardiac catheterization at present given acute renal failure. Ultimately as she improves it may be  worthwhile to proceed with nuclear study for risk stratification and reserve catheterization for high risk study. 2 coronary artery disease-continue aspirin and statin. 3 acute renal failure-being followed by nephrology. 4 history of chronic diastolic congestive heart failure-follow volume status closely. She will most likely ultimately require resumption of diuretics.  Krista MillersBrian Crenshaw 11/08/2014, 8:27 AM

## 2014-11-08 NOTE — Progress Notes (Signed)
Assessment:  1. Acute Kidney Injury, recovery phase     CKD 3 (cr2.)  2. DM 3 .Anemia  4. Low BP on midodrine 5 Orthostatic 6 NSTEMI  Plan: supportive tx  Subjective: Interval History: low O2 sats yest of uncertain significance; increased UOP  Objective: Vital signs in last 24 hours: Temp:  [97 F (36.1 Wise)-97.7 F (36.5 Wise)] 97 F (36.1 Wise) (04/16 0950) Pulse Rate:  [79-87] 87 (04/16 0950) Resp:  [16-18] 18 (04/16 0950) BP: (130-147)/(69-82) 146/81 mmHg (04/16 0950) SpO2:  [84 %-99 %] 99 % (04/16 0950) Weight:  [95.029 kg (209 lb 8 oz)] 95.029 kg (209 lb 8 oz) (04/15 2145) Weight change: 0.181 kg (6.4 oz)  Intake/Output from previous day: 04/15 0701 - 04/16 0700 In: 480 [P.O.:480] Out: 300 [Urine:300] Intake/Output this shift: Total I/O In: 240 [P.O.:240] Out: 0  PE Cor RRR Lungs clear Ext 1+ Alert today  Lab Results:  Recent Labs  11/06/14 0641 11/08/14 0425  WBC 9.2 9.3  HGB 9.2* 9.4*  HCT 29.0* 30.3*  PLT 250 273   BMET:  Recent Labs  11/07/14 0440 11/08/14 0425  NA 136 140  K 5.1 5.0  CL 100 103  CO2 20 18*  GLUCOSE 257* 168*  BUN 72* 76*  CREATININE 6.39* 5.61*  CALCIUM 9.2 9.5   No results for input(s): PTH in the last 72 hours. Iron Studies: No results for input(s): IRON, TIBC, TRANSFERRIN, FERRITIN in the last 72 hours. Studies/Results: Dg Abd 1 View  11/07/2014   CLINICAL DATA:  Pt states she has had abdominal pain for the last few days. Pt had a regular BM this morning and is not constipated- states the pain is unrelated to bowels.  EXAM: ABDOMEN - 1 VIEW  COMPARISON:  CT, 10/20/2014.  FINDINGS: Moderate gaseous distention of the stomach. Normal bowel gas pattern. No evidence of obstruction.  Soft tissues are unremarkable.  There stable changes from a lower lumbar spine posterior fusion.  IMPRESSION: 1. No acute findings. 2. No evidence of bowel obstruction or generalized adynamic ileus.   Electronically Signed   By: Krista Wise M.D.    On: 11/07/2014 16:28   Mr Brain Wo Contrast  11/07/2014   CLINICAL DATA:  Four day history of vomiting, diarrhea and weakness. Dizziness.  EXAM: MRI HEAD WITHOUT CONTRAST  TECHNIQUE: Multiplanar, multiecho pulse sequences of the brain and surrounding structures were obtained without intravenous contrast.  COMPARISON:  MRA intracranial 10/1913.  CT head 02/07/2012.  FINDINGS: No evidence for acute infarction, hemorrhage, mass lesion, hydrocephalus, or extra-axial fluid. Moderate cerebral and cerebellar atrophy, premature for age. Minimal white matter disease. Flow voids are maintained throughout the carotid, basilar, and vertebral arteries. There are no areas of chronic hemorrhage. Pituitary, pineal, and cerebellar tonsils unremarkable. No upper cervical lesions. BILATERAL cataract extraction. No sinus or mastoid disease. Extracranial soft tissues otherwise unremarkable.  Compared with prior CT, further cerebral volume loss has occurred.  IMPRESSION: Moderate cerebral and cerebellar atrophy progressive from 2013.  No acute intracranial findings.   Electronically Signed   By: Krista Wise M.D.   On: 11/07/2014 09:54   Dg Chest Port 1 View  11/07/2014   CLINICAL DATA:  Shortness of Breath  EXAM: PORTABLE CHEST - 1 VIEW  COMPARISON:  November 03, 2014  FINDINGS: Dual-lumen catheter present with tips in the superior vena cava. No pneumothorax. There is cardiomegaly with pulmonary venous hypertension and interstitial edema. There is no appreciable airspace consolidation. No adenopathy.  IMPRESSION: Evidence  of congestive heart failure.  No pneumothorax.   Electronically Signed   By: Bretta BangWilliam  Woodruff Wise M.D.   On: 11/07/2014 17:18     LOS: 20 days   Krista Wise 11/08/2014,2:53 PM
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
## 2014-11-09 LAB — CBC
HCT: 29.8 % — ABNORMAL LOW (ref 36.0–46.0)
Hemoglobin: 9.2 g/dL — ABNORMAL LOW (ref 12.0–15.0)
MCH: 25.8 pg — AB (ref 26.0–34.0)
MCHC: 30.9 g/dL (ref 30.0–36.0)
MCV: 83.5 fL (ref 78.0–100.0)
Platelets: 259 10*3/uL (ref 150–400)
RBC: 3.57 MIL/uL — ABNORMAL LOW (ref 3.87–5.11)
RDW: 19.7 % — AB (ref 11.5–15.5)
WBC: 7 10*3/uL (ref 4.0–10.5)

## 2014-11-09 LAB — RENAL FUNCTION PANEL
ALBUMIN: 2.7 g/dL — AB (ref 3.5–5.2)
ANION GAP: 16 — AB (ref 5–15)
BUN: 79 mg/dL — ABNORMAL HIGH (ref 6–23)
CALCIUM: 9.4 mg/dL (ref 8.4–10.5)
CO2: 21 mmol/L (ref 19–32)
Chloride: 101 mmol/L (ref 96–112)
Creatinine, Ser: 4.99 mg/dL — ABNORMAL HIGH (ref 0.50–1.10)
GFR calc Af Amer: 10 mL/min — ABNORMAL LOW (ref >90)
GFR calc non Af Amer: 8 mL/min — ABNORMAL LOW (ref >90)
GLUCOSE: 202 mg/dL — AB (ref 70–99)
POTASSIUM: 4.8 mmol/L (ref 3.5–5.1)
Phosphorus: 7.1 mg/dL — ABNORMAL HIGH (ref 2.3–4.6)
SODIUM: 138 mmol/L (ref 135–145)

## 2014-11-09 LAB — GLUCOSE, CAPILLARY
GLUCOSE-CAPILLARY: 221 mg/dL — AB (ref 70–99)
Glucose-Capillary: 185 mg/dL — ABNORMAL HIGH (ref 70–99)
Glucose-Capillary: 245 mg/dL — ABNORMAL HIGH (ref 70–99)
Glucose-Capillary: 205 mg/dL — ABNORMAL HIGH (ref 70–99)

## 2014-11-09 MED ORDER — ASPIRIN 81 MG PO CHEW
81.0000 mg | CHEWABLE_TABLET | Freq: Every day | ORAL | Status: DC
  Administered 2014-11-10 – 2014-11-14 (×5): 81 mg via ORAL
  Filled 2014-11-09 (×5): qty 1

## 2014-11-09 NOTE — Progress Notes (Signed)
Assessment:  1. Acute Kidney Injury, recovery phase  CKD 3 (cr2.)  2. DM 3 .Anemia  4. Low BP on midodrine, improving 5 Orthostatic 6 NSTEMI  Plan: supportive tx  Subjective: Interval History: No change except increased uop.  Objective: Vital signs in last 24 hours: Temp:  [97.5 F (36.4 C)-98 F (36.7 C)] 98 F (36.7 C) (04/17 0442) Pulse Rate:  [74-82] 81 (04/17 0442) Resp:  [16-18] 18 (04/17 0442) BP: (138-142)/(59-84) 142/84 mmHg (04/17 0442) SpO2:  [96 %-97 %] 96 % (04/17 0442) Weight:  [92.2 kg (203 lb 4.2 oz)] 92.2 kg (203 lb 4.2 oz) (04/17 0500) Weight change: -2.828 kg (-6 lb 3.8 oz)  Intake/Output from previous day: 04/16 0701 - 04/17 0700 In: 1060 [P.O.:1060] Out: 401 [Urine:400; Stool:1] Intake/Output this shift: Total I/O In: 60 [P.O.:60] Out: 126 [Urine:125; Stool:1]  General appearance: alert and cooperative Head: Normocephalic, without obvious abnormality, atraumatic Eyes: negative Chest wall: no tenderness Cardio: regular rate and rhythm, S1, S2 normal, no murmur, click, rub or gallop Extremities: edema 1+  Lab Results:  Recent Labs  11/08/14 0425 11/09/14 0411  WBC 9.3 7.0  HGB 9.4* 9.2*  HCT 30.3* 29.8*  PLT 273 259   BMET:  Recent Labs  11/08/14 0425 11/09/14 0411  NA 140 138  K 5.0 4.8  CL 103 101  CO2 18* 21  GLUCOSE 168* 202*  BUN 76* 79*  CREATININE 5.61* 4.99*  CALCIUM 9.5 9.4   No results for input(s): PTH in the last 72 hours. Iron Studies: No results for input(s): IRON, TIBC, TRANSFERRIN, FERRITIN in the last 72 hours. Studies/Results: Dg Abd 1 View  11/07/2014   CLINICAL DATA:  Pt states she has had abdominal pain for the last few days. Pt had a regular BM this morning and is not constipated- states the pain is unrelated to bowels.  EXAM: ABDOMEN - 1 VIEW  COMPARISON:  CT, 10/20/2014.  FINDINGS: Moderate gaseous distention of the stomach. Normal bowel gas pattern. No evidence of obstruction.  Soft tissues are  unremarkable.  There stable changes from a lower lumbar spine posterior fusion.  IMPRESSION: 1. No acute findings. 2. No evidence of bowel obstruction or generalized adynamic ileus.   Electronically Signed   By: Amie Portlandavid  Ormond M.D.   On: 11/07/2014 16:28   Dg Chest Port 1 View  11/07/2014   CLINICAL DATA:  Shortness of Breath  EXAM: PORTABLE CHEST - 1 VIEW  COMPARISON:  November 03, 2014  FINDINGS: Dual-lumen catheter present with tips in the superior vena cava. No pneumothorax. There is cardiomegaly with pulmonary venous hypertension and interstitial edema. There is no appreciable airspace consolidation. No adenopathy.  IMPRESSION: Evidence of congestive heart failure.  No pneumothorax.   Electronically Signed   By: Bretta BangWilliam  Woodruff III M.D.   On: 11/07/2014 17:18    Scheduled: . [START ON 11/10/2014] aspirin  81 mg Oral Daily  . atorvastatin  40 mg Oral q1800  . chlorhexidine  15 mL Mouth Rinse BID  . DULoxetine  60 mg Oral BID  . feeding supplement (RESOURCE BREEZE)  1 Container Oral BID BM  . ferumoxytol  510 mg Intravenous Once  . insulin aspart  0-9 Units Subcutaneous TID WC  . insulin glargine  11 Units Subcutaneous QHS  . lidocaine  1 patch Transdermal Q24H  . midodrine  5 mg Oral TID WC  . multivitamin  1 tablet Oral QHS  . pantoprazole (PROTONIX) IV  40 mg Intravenous Q12H  . predniSONE  20 mg Oral Q breakfast  . rOPINIRole  2 mg Oral QHS  . silver sulfADIAZINE   Topical Daily  . sodium bicarbonate  650 mg Oral BID      LOS: 21 days   Tvisha Schwoerer C 11/09/2014,2:31 PM
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
## 2014-11-10 DIAGNOSIS — R42 Dizziness and giddiness: Secondary | ICD-10-CM | POA: Insufficient documentation

## 2014-11-10 DIAGNOSIS — I5032 Chronic diastolic (congestive) heart failure: Secondary | ICD-10-CM

## 2014-11-10 LAB — RENAL FUNCTION PANEL
ALBUMIN: 2.8 g/dL — AB (ref 3.5–5.2)
ANION GAP: 14 (ref 5–15)
BUN: 84 mg/dL — AB (ref 6–23)
CHLORIDE: 104 mmol/L (ref 96–112)
CO2: 22 mmol/L (ref 19–32)
Calcium: 9.3 mg/dL (ref 8.4–10.5)
Creatinine, Ser: 4.31 mg/dL — ABNORMAL HIGH (ref 0.50–1.10)
GFR calc non Af Amer: 10 mL/min — ABNORMAL LOW (ref >90)
GFR, EST AFRICAN AMERICAN: 11 mL/min — AB (ref >90)
Glucose, Bld: 178 mg/dL — ABNORMAL HIGH (ref 70–99)
PHOSPHORUS: 5.2 mg/dL — AB (ref 2.3–4.6)
POTASSIUM: 4.4 mmol/L (ref 3.5–5.1)
Sodium: 140 mmol/L (ref 135–145)

## 2014-11-10 LAB — CBC
HCT: 30.3 % — ABNORMAL LOW (ref 36.0–46.0)
Hemoglobin: 9.4 g/dL — ABNORMAL LOW (ref 12.0–15.0)
MCH: 26.1 pg (ref 26.0–34.0)
MCHC: 31 g/dL (ref 30.0–36.0)
MCV: 84.2 fL (ref 78.0–100.0)
PLATELETS: 255 10*3/uL (ref 150–400)
RBC: 3.6 MIL/uL — ABNORMAL LOW (ref 3.87–5.11)
RDW: 19.7 % — ABNORMAL HIGH (ref 11.5–15.5)
WBC: 8.1 10*3/uL (ref 4.0–10.5)

## 2014-11-10 LAB — GLUCOSE, CAPILLARY
GLUCOSE-CAPILLARY: 154 mg/dL — AB (ref 70–99)
GLUCOSE-CAPILLARY: 286 mg/dL — AB (ref 70–99)
Glucose-Capillary: 208 mg/dL — ABNORMAL HIGH (ref 70–99)
Glucose-Capillary: 272 mg/dL — ABNORMAL HIGH (ref 70–99)

## 2014-11-10 MED ORDER — PRO-STAT SUGAR FREE PO LIQD
30.0000 mL | Freq: Two times a day (BID) | ORAL | Status: DC
  Administered 2014-11-10 – 2014-11-14 (×7): 30 mL via ORAL
  Filled 2014-11-10 (×13): qty 30

## 2014-11-10 MED ORDER — BOOST / RESOURCE BREEZE PO LIQD
1.0000 | Freq: Three times a day (TID) | ORAL | Status: DC
  Administered 2014-11-10 – 2014-11-13 (×4): 1 via ORAL

## 2014-11-10 MED ORDER — MIDODRINE HCL 2.5 MG PO TABS
2.5000 mg | ORAL_TABLET | Freq: Three times a day (TID) | ORAL | Status: DC
  Administered 2014-11-10 – 2014-11-13 (×9): 2.5 mg via ORAL
  Filled 2014-11-10 (×12): qty 1

## 2014-11-10 MED ORDER — HEPARIN SODIUM (PORCINE) 5000 UNIT/ML IJ SOLN
5000.0000 [IU] | Freq: Three times a day (TID) | INTRAMUSCULAR | Status: DC
  Administered 2014-11-10 – 2014-11-14 (×11): 5000 [IU] via SUBCUTANEOUS
  Filled 2014-11-10 (×12): qty 1

## 2014-11-10 MED ORDER — PREDNISONE 10 MG PO TABS
10.0000 mg | ORAL_TABLET | Freq: Every day | ORAL | Status: DC
  Administered 2014-11-11 – 2014-11-14 (×4): 10 mg via ORAL
  Filled 2014-11-10 (×5): qty 1

## 2014-11-10 MED ORDER — PANTOPRAZOLE SODIUM 40 MG PO TBEC
40.0000 mg | DELAYED_RELEASE_TABLET | Freq: Two times a day (BID) | ORAL | Status: DC
  Administered 2014-11-10 – 2014-11-14 (×9): 40 mg via ORAL
  Filled 2014-11-10 (×9): qty 1

## 2014-11-10 NOTE — Progress Notes (Signed)
Subjective:  Denies CP or dyspnea   Objective:  Filed Vitals:   11/09/14 1820 11/09/14 1946 11/10/14 0500 11/10/14 0612  BP: 140/77 132/66  126/57  Pulse: 80 72  74  Temp: 98.1 F (36.7 C) 97.7 F (36.5 C)  98.1 F (36.7 C)  TempSrc: Oral Oral  Oral  Resp: 18 18  17   Height:      Weight:  209 lb 1.6 oz (94.847 kg) 209 lb 1.4 oz (94.84 kg)   SpO2:  96%  94%    Intake/Output from previous day:  Intake/Output Summary (Last 24 hours) at 11/10/14 0953 Last data filed at 11/10/14 [id number]  Gross per 24 hour  Intake    460 ml  Output    175 ml  Net    285 ml   . aspirin  81 mg Oral Daily  . atorvastatin  40 mg Oral q1800  . chlorhexidine  15 mL Mouth Rinse BID  . DULoxetine  60 mg Oral BID  . feeding supplement (PRO-STAT SUGAR FREE 64)  30 mL Oral BID  . feeding supplement (RESOURCE BREEZE)  1 Container Oral TID BM  . ferumoxytol  510 mg Intravenous Once  . heparin subcutaneous  5,000 Units Subcutaneous 3 times per day  . insulin aspart  0-9 Units Subcutaneous TID WC  . insulin glargine  11 Units Subcutaneous QHS  . lidocaine  1 patch Transdermal Q24H  . midodrine  2.5 mg Oral TID WC  . multivitamin  1 tablet Oral QHS  . pantoprazole  40 mg Oral Q12H  . predniSONE  20 mg Oral Q breakfast  . rOPINIRole  2 mg Oral QHS  . silver sulfADIAZINE   Topical Daily  . sodium bicarbonate  650 mg Oral BID   Physical Exam: Physical exam: Well-developed chronically ill appearing in no acute distress.  Skin is warm and dry.  HEENT is normal.  Neck is supple.  Chest is clear to auscultation with normal expansion.  Cardiovascular exam is regular rate and rhythm, 2/6 systolic murmur LSB  neuro grossly intact    Lab Results: Basic Metabolic Panel:  Recent Labs  11/91/4704/17/16 0411 11/10/14 0536  NA 138 140  K 4.8 4.4  CL 101 104  CO2 21 22  GLUCOSE 202* 178*  BUN 79* 84*  CREATININE 4.99* 4.31*  CALCIUM 9.4 9.3  PHOS 7.1* 5.2*   CBC:  Recent Labs  11/09/14 0411  11/10/14 0536  WBC 7.0 8.1  HGB 9.2* 9.4*  HCT 29.8* 30.3*  MCV 83.5 84.2  PLT 259 255   Echo 10/20/14 Study Conclusions  - Left ventricle: The cavity size was normal. Wall thickness was increased in a pattern of mild LVH. Systolic function was normal. The estimated ejection fraction was in the range of 50% to 55%. There is hypokinesis of the inferolateral myocardium. Doppler parameters are consistent with abnormal left ventricular relaxation (grade 1 diastolic dysfunction). Doppler parameters are consistent with high ventricular filling pressure. - Ventricular septum: The contour showed diastolic flattening and systolic flattening. - Aortic valve: There was mild regurgitation. - Mitral valve: Calcified annulus. There was mild regurgitation. - Left atrium: The atrium was moderately dilated. - Right atrium: The atrium was mildly dilated. - Pulmonary arteries: Systolic pressure was severely increased. PA peak pressure: 60 mm Hg (S).  Impressions:  - Inferior lateral hypokinesis with overall low normal LV function; grade 1 diastolic dysfunction with elevated LV filling pressure; biatrial enlargement; calcified aortic valve; mean gradient of 15  mmHg suggests mild AS but visually, valve opens well, mild MR, AI and TR; severely elevated pulmonary pressure    Assessment/Plan:  1 non-ST elevation myocardial infarction-occurred in the setting of acute GI illness with subsequent dehydration and renal failure. Continue aspirin and statin. Blood pressure is much improved. Would wean midodrine if possible. If blood pressure remains stable would then add low-dose metoprolol. She will ultimately require an ischemia evaluation. She would not be a good candidate for cardiac catheterization at present given acute renal failure. As she improves it may be worthwhile to proceed with nuclear study for risk stratification and reserve catheterization for high risk study. 2  knowncoronary artery disease s/p prior CFX stent 2008, continue aspirin and statin. 3 acute renal failure in setting of chronic renal insuffiencey being followed by nephrology. 4 history of chronic diastolic congestive heart failure-follow volume status closely. She will most likely ultimately require resumption of diuretics.  Plan: MD to see. SCr continues to improve. B/P stable. Will decrease Proamatine to 2.5 mg TID today. Add low dose beta blocker once she is off Proamatine.   Corine Shelter K  PA 11/10/2014, 9:53 AM   Patient seen and examined. Agree with assessment and plan. No chest pain or increasing  dizziness. Will decrease midodrin to 2.5 mg tid today then try to decrease to bid tomorrow if no significant orthostasis. 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 Add low dose BB if stable off midodrin   Lennette Bihari, MD, [facility] 11/10/2014 2:19 PM

## 2014-11-10 NOTE — Progress Notes (Signed)
Inpatient Diabetes Program Recommendations  AACE/ADA: New Consensus Statement on Inpatient Glycemic Control (2013)  Target Ranges:  Prepandial:   less than 140 mg/dL      Peak postprandial:   less than 180 mg/dL (1-2 hours)      Critically ill patients:  140 - 180 mg/dL   Reason for Assessment:  Results for [name], Krista Wise (MRN [id number]) as of 11/10/2014 12:26  Ref. Range 11/09/2014 11:28 11/09/2014 17:04 11/09/2014 21:41 11/10/2014 07:59 11/10/2014 11:47  Glucose-Capillary Latest Ref Range: 70-99 mg/dL 409245 (H) 811205 (H) 914221 (H) 154 (H) 208 (H)    Not CBG's increased at lunch and supper time.  According to documentation, patient is eating very little.  May consider increasing Novolog correction to moderate while patient is on Prednisone.    Will follow.  Thanks, Beryl MeagerJenny Santiago Graf, RN, BC-ADM Inpatient Diabetes Coordinator Pager [phone] (8a-5p)
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
## 2014-11-10 NOTE — Progress Notes (Signed)
Pt refusing BiPAP for the night. RT will continue to monitor.
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
## 2014-11-10 NOTE — Progress Notes (Signed)
Physical Therapy Vestibular Treatment  Patient seen for treatment for L posterior canal BPPV.  Performed left hall pike with only mild symptoms and minimal nystagmus.  Rotated head to right and pt with severe vertigo and positive for intense right rotary nystagmus.  She became too symptomatic to continue treatment and was assisted into sitting and given cool cloth and pan and though heaved never vomited.  She requested to try again at later date.  Will return tomorrow and request nausea medication prior to treatment for Right posterior canal BPPV.  Tampicoyndi Wynn, [place] [phone] 11/10/2014 1445-1500 Canalith repositioning
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
## 2014-11-11 DIAGNOSIS — E785 Hyperlipidemia, unspecified: Secondary | ICD-10-CM

## 2014-11-11 DIAGNOSIS — E118 Type 2 diabetes mellitus with unspecified complications: Secondary | ICD-10-CM

## 2014-11-11 DIAGNOSIS — I5033 Acute on chronic diastolic (congestive) heart failure: Secondary | ICD-10-CM

## 2014-11-11 LAB — CBC
HEMATOCRIT: 29.5 % — AB (ref 36.0–46.0)
HEMOGLOBIN: 9.2 g/dL — AB (ref 12.0–15.0)
MCH: 26.3 pg (ref 26.0–34.0)
MCHC: 31.2 g/dL (ref 30.0–36.0)
MCV: 84.3 fL (ref 78.0–100.0)
Platelets: 262 10*3/uL (ref 150–400)
RBC: 3.5 MIL/uL — AB (ref 3.87–5.11)
RDW: 19.8 % — ABNORMAL HIGH (ref 11.5–15.5)
WBC: 8 10*3/uL (ref 4.0–10.5)

## 2014-11-11 LAB — GLUCOSE, CAPILLARY
GLUCOSE-CAPILLARY: 316 mg/dL — AB (ref 70–99)
Glucose-Capillary: 211 mg/dL — ABNORMAL HIGH (ref 70–99)
Glucose-Capillary: 275 mg/dL — ABNORMAL HIGH (ref 70–99)

## 2014-11-11 LAB — RENAL FUNCTION PANEL
ALBUMIN: 2.7 g/dL — AB (ref 3.5–5.2)
ANION GAP: 12 (ref 5–15)
BUN: 77 mg/dL — ABNORMAL HIGH (ref 6–23)
CO2: 23 mmol/L (ref 19–32)
Calcium: 8.9 mg/dL (ref 8.4–10.5)
Chloride: 104 mmol/L (ref 96–112)
Creatinine, Ser: 3.53 mg/dL — ABNORMAL HIGH (ref 0.50–1.10)
GFR calc non Af Amer: 12 mL/min — ABNORMAL LOW (ref >90)
GFR, EST AFRICAN AMERICAN: 14 mL/min — AB (ref >90)
Glucose, Bld: 218 mg/dL — ABNORMAL HIGH (ref 70–99)
PHOSPHORUS: 4.3 mg/dL (ref 2.3–4.6)
POTASSIUM: 3.8 mmol/L (ref 3.5–5.1)
SODIUM: 139 mmol/L (ref 135–145)

## 2014-11-11 MED ORDER — SALINE SPRAY 0.65 % NA SOLN
1.0000 | NASAL | Status: DC | PRN
  Filled 2014-11-11: qty 44

## 2014-11-11 MED ORDER — DARBEPOETIN ALFA 40 MCG/0.4ML IJ SOSY
40.0000 ug | PREFILLED_SYRINGE | INTRAMUSCULAR | Status: DC
  Administered 2014-11-11: 40 ug via SUBCUTANEOUS
  Filled 2014-11-11 (×2): qty 0.4
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
## 2014-11-11 NOTE — Progress Notes (Addendum)
Pt stated that she wanted to use the BIPAP tonight and for RT to come back around 2200.  RT to monitor and assess as needed.

## 2014-11-11 NOTE — Progress Notes (Signed)
I will follow up in the morning to verify if inpt rehab bed will be available tomorrow after cardiac workup complete. [phone]
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
## 2014-11-11 NOTE — Progress Notes (Signed)
Pt. Stated that they had a bloody nose earlier in the day.  MD notified.  Order received to hold 2200 Heparin and saline nasal spray.  Peri MarisAndrew Keithen Capo, MBA, BS, RN
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
## 2014-11-11 NOTE — Progress Notes (Signed)
Subjective: No CP, SOB, orthopnea  Objective: Vital signs in last 24 hours: Temp:  [97.7 F (36.5 C)-98.7 F (37.1 C)] 98.7 F (37.1 C) (04/19 0500) Pulse Rate:  [70-75] 70 (04/19 0500) Resp:  [17-18] 17 (04/19 0500) BP: (122-151)/(66-82) 132/72 mmHg (04/19 0500) SpO2:  [93 %-98 %] 93 % (04/19 0500) Weight:  [209 lb 1.4 oz (94.84 kg)] 209 lb 1.4 oz (94.84 kg) (04/19 0249) Last BM Date: 11/09/14  Intake/Output from previous day: 04/18 0701 - 04/19 0700 In: 600 [P.O.:600] Out: 500 [Urine:500] Intake/Output this shift:    Medications Current Facility-Administered Medications  Medication Dose Route Frequency Provider Last Rate Last Dose  . acetaminophen (TYLENOL) tablet 650 mg  650 mg Oral Q6H PRN Joseph ArtJessica U Vann, DO   650 mg at 11/07/14 1110  . albuterol (PROVENTIL) (2.5 MG/3ML) 0.083% nebulizer solution 2.5 mg  2.5 mg Nebulization Q4H PRN Jeanella CrazeBrandi L Ollis, NP   2.5 mg at 10/21/14 1100  . aspirin chewable tablet 81 mg  81 mg Oral Daily Belkys A Regalado, MD   81 mg at 11/10/14 0927  . atorvastatin (LIPITOR) tablet 40 mg  40 mg Oral q1800 Drema Dallasurtis J Woods, MD   40 mg at 11/10/14 1814  . chlorhexidine (PERIDEX) 0.12 % solution 15 mL  15 mL Mouth Rinse BID Alyson ReedyWesam G Yacoub, MD   15 mL at 11/10/14 2237  . Darbepoetin Alfa (ARANESP) injection 40 mcg  40 mcg Subcutaneous Q Tue-1800 Terrial RhodesJoseph Coladonato, MD      . DULoxetine (CYMBALTA) DR capsule 60 mg  60 mg Oral BID Leslye Peerobert S Byrum, MD   60 mg at 11/10/14 2227  . feeding supplement (PRO-STAT SUGAR FREE 64) liquid 30 mL  30 mL Oral BID Marijean NiemannStephanie La, RD   30 mL at 11/10/14 2226  . feeding supplement (RESOURCE BREEZE) (RESOURCE BREEZE) liquid 1 Container  1 Container Oral TID BM Marijean NiemannStephanie La, RD   1 Container at 11/10/14 2237  . heparin injection 5,000 Units  5,000 Units Subcutaneous 3 times per day Alba CoryBelkys A Regalado, MD   5,000 Units at 11/11/14 0647  . insulin aspart (novoLOG) injection 0-9 Units  0-9 Units Subcutaneous TID WC Alba CoryBelkys A  Regalado, MD   3 Units at 11/11/14 0835  . insulin glargine (LANTUS) injection 11 Units  11 Units Subcutaneous QHS Belkys A Regalado, MD   11 Units at 11/10/14 2228  . lidocaine (LIDODERM) 5 % 1 patch  1 patch Transdermal Q24H Joseph ArtJessica U Vann, DO   1 patch at 11/10/14 1357  . midodrine (PROAMATINE) tablet 2.5 mg  2.5 mg Oral TID WC Eda PaschalLuke K Kilroy, PA-C   2.5 mg at 11/11/14 [id number]  . multivitamin (RENA-VIT) tablet 1 tablet  1 tablet Oral QHS Primitivo GauzeMichael Mattingly, MD   1 tablet at 11/10/14 2227  . ondansetron (ZOFRAN) injection 4 mg  4 mg Intravenous Q6H PRN Belkys A Regalado, MD   4 mg at 11/07/14 0730  . pantoprazole (PROTONIX) EC tablet 40 mg  40 mg Oral Q12H Lynita LombardJennifer D CathayDurham, RPH   40 mg at 11/10/14 2227  . predniSONE (DELTASONE) tablet 10 mg  10 mg Oral Q breakfast Belkys A Regalado, MD   10 mg at 11/11/14 0833  . rOPINIRole (REQUIP) tablet 2 mg  2 mg Oral QHS Leslye Peerobert S Byrum, MD   2 mg at 11/10/14 2227  . silver sulfADIAZINE (SILVADENE) 1 % cream   Topical Daily Belkys A Regalado, MD      . sodium bicarbonate  tablet 650 mg  650 mg Oral BID Belkys A Regalado, MD   650 mg at 11/10/14 2227  . sodium chloride 0.9 % injection 10-40 mL  10-40 mL Intracatheter PRN Drema Dallas, MD   10 mL at 10/31/14 0524    PE: General appearance: alert, cooperative and no distress Lungs: clear to auscultation bilaterally Heart: regular rate and rhythm and 2/6 sys MM Pulses: 2+ radials Extremities: No LEE Skin: Warm and dry.  left foot bandaged.  Neurologic: Grossly normal  Lab Results:   Recent Labs  11/09/14 0411 11/10/14 0536 11/11/14 0708  WBC 7.0 8.1 8.0  HGB 9.2* 9.4* 9.2*  HCT 29.8* 30.3* 29.5*  PLT 259 255 262   BMET  Recent Labs  11/09/14 0411 11/10/14 0536 11/11/14 0708  NA 138 140 139  K 4.8 4.4 3.8  CL 101 104 104  CO2 GLUCOSE 202* 178* 218*  BUN 79* 84* 77*  CREATININE 4.99* 4.31* 3.53*  CALCIUM 9.4 9.3 8.9   Assessment/Plan  Active Problems:   Diabetes mellitus  type 2 with complications   CKD (chronic kidney disease) stage 3, GFR 30-59 ml/min   Acute respiratory failure with hypoxemia   Hypotension   CHF (congestive heart failure)   Burn of left foot   Septic shock   Acute respiratory failure   Cellulitis   Burn of right foot   Diastolic CHF, acute on chronic   Diabetes type 2, uncontrolled   HLD (hyperlipidemia)   NSTEMI (non-ST elevated myocardial infarction)   Acute renal failure with tubular necrosis   Dizziness  1 non-ST elevation myocardial infarction- occurred in the setting of acute GI illness with subsequent dehydration and renal failure. Continue aspirin and statin. Blood pressure improved. Wean midodrine.  Add low-dose metoprolol once off. She will ultimately require an ischemia evaluation. She would not be a good candidate for cardiac catheterization at present given acute renal failure.  --Proceed with nuclear study(lexiscan) tomorrow for risk stratification and reserve catheterization for high risk study.  She will transfer to IP rehab tomorrow after study results.    No hypotension.    2 knowncoronary artery disease  s/p prior CFX stent 2008, continue aspirin and statin. 3 acute renal failure in setting of chronic renal insuffiencey  being followed by nephrology.  SCr continues to improve.   4 history of chronic diastolic congestive heart failure- Weight stable.  Follow volume status closely. She will most likely ultimately require resumption of diuretics.      LOS: 23 days    HAGER, BRYAN PA-C 11/11/2014 9:16 AM   Patient seen and examined. Agree with assessment and plan. No chest pain. Mild nosebleed presently; may need to reduce or dc sq heparin if recurrent. For pharmacologic myoview tset in am for risk stratification of CAD.  Lennette Bihari, MD, [facility] 11/11/2014 2:55 PM

## 2014-11-11 NOTE — Progress Notes (Signed)
Pt placed on Auto BIPAP 4-15 CMH20 via FFM.  Pt tolerating well at this time, RT to monitor and assess as needed.

## 2014-11-12 ENCOUNTER — Inpatient Hospital Stay (HOSPITAL_COMMUNITY): Payer: Medicare Other

## 2014-11-12 ENCOUNTER — Other Ambulatory Visit: Payer: Self-pay | Admitting: Diagnostic Radiology

## 2014-11-12 DIAGNOSIS — E1165 Type 2 diabetes mellitus with hyperglycemia: Secondary | ICD-10-CM

## 2014-11-12 DIAGNOSIS — I214 Non-ST elevation (NSTEMI) myocardial infarction: Secondary | ICD-10-CM

## 2014-11-12 LAB — CBC
HEMATOCRIT: 30.5 % — AB (ref 36.0–46.0)
Hemoglobin: 9.4 g/dL — ABNORMAL LOW (ref 12.0–15.0)
MCH: 25.9 pg — AB (ref 26.0–34.0)
MCHC: 30.8 g/dL (ref 30.0–36.0)
MCV: 84 fL (ref 78.0–100.0)
PLATELETS: 285 10*3/uL (ref 150–400)
RBC: 3.63 MIL/uL — ABNORMAL LOW (ref 3.87–5.11)
RDW: 19.9 % — AB (ref 11.5–15.5)
WBC: 8.5 10*3/uL (ref 4.0–10.5)

## 2014-11-12 LAB — RENAL FUNCTION PANEL
ANION GAP: 12 (ref 5–15)
Albumin: 2.9 g/dL — ABNORMAL LOW (ref 3.5–5.2)
BUN: 73 mg/dL — ABNORMAL HIGH (ref 6–23)
CO2: 24 mmol/L (ref 19–32)
Calcium: 9.2 mg/dL (ref 8.4–10.5)
Chloride: 105 mmol/L (ref 96–112)
Creatinine, Ser: 3.16 mg/dL — ABNORMAL HIGH (ref 0.50–1.10)
GFR calc Af Amer: 17 mL/min — ABNORMAL LOW (ref >90)
GFR, EST NON AFRICAN AMERICAN: 14 mL/min — AB (ref >90)
GLUCOSE: 201 mg/dL — AB (ref 70–99)
PHOSPHORUS: 4.2 mg/dL (ref 2.3–4.6)
POTASSIUM: 4.3 mmol/L (ref 3.5–5.1)
Sodium: 141 mmol/L (ref 135–145)

## 2014-11-12 LAB — GLUCOSE, CAPILLARY
Glucose-Capillary: 221 mg/dL — ABNORMAL HIGH (ref 70–99)
Glucose-Capillary: 220 mg/dL — ABNORMAL HIGH (ref 70–99)
Glucose-Capillary: 291 mg/dL — ABNORMAL HIGH (ref 70–99)

## 2014-11-12 MED ORDER — TECHNETIUM TC 99M SESTAMIBI GENERIC - CARDIOLITE
10.0000 | Freq: Once | INTRAVENOUS | Status: AC | PRN
  Administered 2014-11-12: 10 via INTRAVENOUS

## 2014-11-12 MED ORDER — INSULIN GLARGINE 100 UNIT/ML ~~LOC~~ SOLN
16.0000 [IU] | Freq: Every day | SUBCUTANEOUS | Status: DC
  Administered 2014-11-12 – 2014-11-13 (×2): 16 [IU] via SUBCUTANEOUS
  Filled 2014-11-12 (×3): qty 0.16

## 2014-11-12 MED ORDER — REGADENOSON 0.4 MG/5ML IV SOLN
0.4000 mg | Freq: Once | INTRAVENOUS | Status: AC
  Administered 2014-11-12: 0.4 mg via INTRAVENOUS

## 2014-11-12 MED ORDER — ACETAMINOPHEN 325 MG PO TABS
ORAL_TABLET | ORAL | Status: AC
  Filled 2014-11-12: qty 2

## 2014-11-12 MED ORDER — TECHNETIUM TC 99M SESTAMIBI GENERIC - CARDIOLITE
30.0000 | Freq: Once | INTRAVENOUS | Status: AC | PRN
  Administered 2014-11-12: 30 via INTRAVENOUS

## 2014-11-12 MED ORDER — REGADENOSON 0.4 MG/5ML IV SOLN
INTRAVENOUS | Status: AC
  Filled 2014-11-12: qty 5

## 2014-11-12 NOTE — Progress Notes (Signed)
Pt refused to wear her BIPAP for the second time tonight. No distress noted. I notified ptatient's nurse Cheryl 

## 2014-11-12 NOTE — Progress Notes (Signed)
Discussed nuc with Dr. Tresa EndoKelly who discussed the case with Dr. Gwenlyn PerkingMadera. Stress test intermediate risk with mild reversibility and ischemia along the apical segment of the inferolateral wall, fixed defects involving the mid and basilar segments of the inferolateral wall could represent an area of infarct, diffuse mild HK, EF 42%. Given recent renal failure we plan to continue to manage medically for now. [name] chest pain free. Dr. Tresa EndoKelly advises against DC to CIR today and instead prefers to monitor the patient another day and see if we can escalate medical therapy tomorrow. Will begin checking orthostatics this evening. If improved by tomorrow will plan to pull off midodrine and try low dose BB. Will also consider addition of Plavix in AM if stable. Sailor Hevia PA-C

## 2014-11-12 NOTE — Progress Notes (Signed)
I discussed with Dr. Gwenlyn PerkingMadera. We will hold admission to inpt rehab today. Cardiology to plan initiation of medical treatment prior to d/c to inpt rehab. I will continue to follow and await medical readiness to admit pt pending bed availability. [phone]
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
## 2014-11-12 NOTE — Progress Notes (Signed)
Pt refused to wear her BIPAP for the second time tonight. No distress noted. I notified ptatient's nurse Elnita Maxwellheryl

## 2014-11-12 NOTE — Progress Notes (Signed)
OT Cancellation Note  Patient Details Name: Theodoro KalataMary O Frisbie MRN: [id number] DOB: [birth year]   Cancelled Treatment:    Reason Eval/Treat Not Completed: Patient at procedure or test/ unavailable  Ericberto Padget, Metro KungLorraine D 11/12/2014, 1:21 PM

## 2014-11-12 NOTE — Progress Notes (Signed)
Patient ID: SHEFALI NG, female   DOB: September 26, 1945, 69 y.o.   MRN: [id number] S:no complaints O:BP 120/61 mmHg  Pulse 84  Temp(Src) 98.3 F (36.8 C) (Oral)  Resp 17  Ht  (1.676 m)  Wt 95.27 kg (210 lb 0.5 oz)  BMI 33.92 kg/m2  SpO2 98%  Intake/Output Summary (Last 24 hours) at 11/12/14 1231 Last data filed at 11/12/14 0600  Gross per 24 hour  Intake      0 ml  Output    350 ml  Net   -350 ml   Intake/Output: I/O last 3 completed shifts: In: 360 [P.O.:360] Out: 850 [Urine:850]  Intake/Output this shift:    Weight change: 0.43 kg (15.2 oz) Gen:WD WN WF in NAD CVS:no rub Resp:cta WUJ:WJXBJY Ext:no edema   Recent Labs Lab 11/06/14 0641 11/07/14 0440 11/08/14 0425 11/08/14 1308 11/09/14 0411 11/10/14 0536 11/11/14 0708 11/12/14 0530  NA 138 136 140  --  138 140 139 141  K 4.6 5.1 5.0  --  4.8 4.4 3.8 4.3  CL 101 100 103  --  101 104 104 105  CO2 22 20 18*  --  GLUCOSE 192* 257* 168*  --  202* 178* 218* 201*  BUN 64* 72* 76*  --  79* 84* 77* 73*  CREATININE 6.79* 6.39* 5.61*  --  4.99* 4.31* 3.53* 3.16*  ALBUMIN 2.5* 2.7*  --  2.6* 2.7* 2.8* 2.7* 2.9*  CALCIUM 9.2 9.2 9.5  --  9.4 9.3 8.9 9.2  PHOS 7.0* 7.3*  --   --  7.1* 5.2* 4.3 4.2  AST  --   --   --  17  --   --   --   --   ALT  --   --   --  <5  --   --   --   --    Liver Function Tests:  Recent Labs Lab 11/08/14 1308  11/10/14 0536 11/11/14 0708 11/12/14 0530  AST 17  --   --   --   --   ALT <5  --   --   --   --   ALKPHOS 75  --   --   --   --   BILITOT 0.6  --   --   --   --   PROT 7.1  --   --   --   --   ALBUMIN 2.6*  < > 2.8* 2.7* 2.9*  < > = values in this interval not displayed. No results for input(s): LIPASE, AMYLASE in the last 168 hours.  Recent Labs Lab 11/08/14 1308  AMMONIA 21   CBC:  Recent Labs Lab 11/08/14 0425 11/09/14 0411 11/10/14 0536 11/11/14 0708 11/12/14 0530  WBC 9.3 7.0 8.1 8.0 8.5  HGB 9.4* 9.2* 9.4* 9.2* 9.4*  HCT 30.3* 29.8* 30.3*  29.5* 30.5*  MCV 82.6 83.5 84.2 84.3 84.0  PLT 273 259 255 262 285   Cardiac Enzymes:  Recent Labs Lab 11/06/14 1241 11/06/14 1800 11/07/14 0440  TROPONINI 0.10* 0.08* 0.08*   CBG:  Recent Labs Lab 11/10/14 2117 11/11/14 0807 11/11/14 1619 11/11/14 2153 11/12/14 0807  GLUCAP 286* 211* 316* 275* 221*    Iron Studies: No results for input(s): IRON, TIBC, TRANSFERRIN, FERRITIN in the last 72 hours. Studies/Results: No results found. Marland Kitchen acetaminophen      . aspirin  81 mg Oral Daily  . atorvastatin  40 mg Oral q1800  .  chlorhexidine  15 mL Mouth Rinse BID  . darbepoetin (ARANESP) injection - NON-DIALYSIS  40 mcg Subcutaneous Q Tue-1800  . DULoxetine  60 mg Oral BID  . feeding supplement (PRO-STAT SUGAR FREE 64)  30 mL Oral BID  . feeding supplement (RESOURCE BREEZE)  1 Container Oral TID BM  . heparin subcutaneous  5,000 Units Subcutaneous 3 times per day  . insulin aspart  0-9 Units Subcutaneous TID WC  . insulin glargine  11 Units Subcutaneous QHS  . lidocaine  1 patch Transdermal Q24H  . midodrine  2.5 mg Oral TID WC  . multivitamin  1 tablet Oral QHS  . pantoprazole  40 mg Oral Q12H  . predniSONE  10 mg Oral Q breakfast  . regadenoson      . rOPINIRole  2 mg Oral QHS  . silver sulfADIAZINE   Topical Daily  . sodium bicarbonate  650 mg Oral BID    BMET    Component Value Date/Time   NA 141 11/12/2014 0530   K 4.3 11/12/2014 0530   CL 105 11/12/2014 0530   CO2 24 11/12/2014 0530   GLUCOSE 201* 11/12/2014 0530   BUN 73* 11/12/2014 0530   CREATININE 3.16* 11/12/2014 0530   CREATININE 2.38* 06/13/2014 1648   CALCIUM 9.2 11/12/2014 0530   GFRNONAA 14* 11/12/2014 0530   GFRAA 17* 11/12/2014 0530   CBC    Component Value Date/Time   WBC 8.5 11/12/2014 0530   RBC 3.63* 11/12/2014 0530   RBC 3.41* 07/13/2014 0706   HGB 9.4* 11/12/2014 0530   HCT 30.5* 11/12/2014 0530   PLT 285 11/12/2014 0530   MCV 84.0 11/12/2014 0530   MCH 25.9* 11/12/2014 0530    MCHC 30.8 11/12/2014 0530   RDW 19.9* 11/12/2014 0530   LYMPHSABS 1.0 10/31/2014 0520   MONOABS 1.5* 10/31/2014 0520   EOSABS 0.2 10/31/2014 0520   BASOSABS 0.0 10/31/2014 0520     Assessment/Plan:  1. AKI/CKD in setting of sepsis and pressors as well NSAIDs. UOP is still less than 800cc however her Scr has continued to improve. 1. Strict I's/O's, daily weights 2. Continue to hold off on HD 2. NSTEMI- stress test pending and if will need cardiac cath, will leave HD catheter in, however is she has low risk stress test, will d/c HD cath.   3. Anemia of chronic disease- will start pt on Aranesp and follow iron stores 4. Vascular access- has new diatek placed 11/03/14 but no HD required since 11/01/14. Will need AVF/AVG placement at some point 1. Plan to remove diatek catheter if her Scr continues to improve (before discharge) and stress test does not warrant cardiac cath. 5. Hypotension- on midodrine 6. DM- per primary svc 7. Deconditioning- being evaluated by CIR. Ok for transfer when bed available  Shandria Clinch A

## 2014-11-12 NOTE — Progress Notes (Signed)
I have an inpt rehab bed available to admit pt to today. RN CM, SW and Pt are aware. Noted for myoview today to complete medical workup. I have notified Dr. Gwenlyn PerkingMadera that bed is available today. [phone]
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
## 2014-11-12 NOTE — Progress Notes (Signed)
Inpatient Diabetes Program Recommendations  AACE/ADA: New Consensus Statement on Inpatient Glycemic Control (2013)  Target Ranges:  Prepandial:   less than 140 mg/dL      Peak postprandial:   less than 180 mg/dL (1-2 hours)      Critically ill patients:  140 - 180 mg/dL   Reason for Assessment:  Results for [name], Noelie O (MRN [id number]) as of 11/12/2014 14:34  Ref. Range 11/10/2014 21:17 11/11/2014 08:07 11/11/2014 16:19 11/11/2014 21:53 11/12/2014 08:07  Glucose-Capillary Latest Ref Range: 70-99 mg/dL 409286 (H) 811211 (H) 914316 (H) 275 (H) 221 (H)    CBG's continue to be elevated.  Please increase Lantus to 16 units q HS.    Thanks, Beryl MeagerJenny Violia Knopf, RN, BC-ADM Inpatient Diabetes Coordinator Pager [phone] (8a-5p)

## 2014-11-12 NOTE — Progress Notes (Signed)
PT Cancellation Note  Patient Details Name: Krista KalataMary O Wise MRN: [id number] DOB: [birth year]   Cancelled Treatment:    Reason Eval/Treat Not Completed: Other (comment); patient just back from stress test awaiting dressing change on foot and reports for admission to CIR today.  Will anticipate PT follow up for vestibular as well as mobility issues on CIR.   Tykeem Lanzer,CYNDI 11/12/2014, 4:02 PM

## 2014-11-13 ENCOUNTER — Inpatient Hospital Stay (HOSPITAL_COMMUNITY): Payer: Medicare Other | Admitting: Physical Therapy

## 2014-11-13 ENCOUNTER — Inpatient Hospital Stay (HOSPITAL_COMMUNITY): Payer: Medicare Other

## 2014-11-13 ENCOUNTER — Inpatient Hospital Stay (HOSPITAL_COMMUNITY): Payer: Medicare Other | Admitting: Occupational Therapy

## 2014-11-13 DIAGNOSIS — E875 Hyperkalemia: Secondary | ICD-10-CM | POA: Insufficient documentation

## 2014-11-13 LAB — RENAL FUNCTION PANEL
ALBUMIN: 2.8 g/dL — AB (ref 3.5–5.2)
ANION GAP: 12 (ref 5–15)
BUN: 62 mg/dL — ABNORMAL HIGH (ref 6–23)
CO2: 23 mmol/L (ref 19–32)
Calcium: 8.9 mg/dL (ref 8.4–10.5)
Chloride: 105 mmol/L (ref 96–112)
Creatinine, Ser: 2.55 mg/dL — ABNORMAL HIGH (ref 0.50–1.10)
GFR calc Af Amer: 21 mL/min — ABNORMAL LOW (ref >90)
GFR, EST NON AFRICAN AMERICAN: 19 mL/min — AB (ref >90)
Glucose, Bld: 168 mg/dL — ABNORMAL HIGH (ref 70–99)
PHOSPHORUS: 3.5 mg/dL (ref 2.3–4.6)
Potassium: 4.1 mmol/L (ref 3.5–5.1)
SODIUM: 140 mmol/L (ref 135–145)

## 2014-11-13 LAB — GLUCOSE, CAPILLARY
GLUCOSE-CAPILLARY: 166 mg/dL — AB (ref 70–99)
GLUCOSE-CAPILLARY: 201 mg/dL — AB (ref 70–99)
GLUCOSE-CAPILLARY: 388 mg/dL — AB (ref 70–99)
GLUCOSE-CAPILLARY: 227 mg/dL — AB (ref 70–99)

## 2014-11-13 LAB — CBC
HCT: 29.4 % — ABNORMAL LOW (ref 36.0–46.0)
HEMOGLOBIN: 9.1 g/dL — AB (ref 12.0–15.0)
MCH: 26.2 pg (ref 26.0–34.0)
MCHC: 31 g/dL (ref 30.0–36.0)
MCV: 84.7 fL (ref 78.0–100.0)
Platelets: 264 10*3/uL (ref 150–400)
RBC: 3.47 MIL/uL — ABNORMAL LOW (ref 3.87–5.11)
RDW: 20.5 % — ABNORMAL HIGH (ref 11.5–15.5)
WBC: 8.4 10*3/uL (ref 4.0–10.5)

## 2014-11-13 MED ORDER — METOPROLOL TARTRATE 12.5 MG HALF TABLET
12.5000 mg | ORAL_TABLET | Freq: Two times a day (BID) | ORAL | Status: DC
  Administered 2014-11-13 – 2014-11-14 (×3): 12.5 mg via ORAL
  Filled 2014-11-13 (×4): qty 1
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
## 2014-11-13 NOTE — Progress Notes (Signed)
Pt refused to wear BIPAP
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
## 2014-11-13 NOTE — Progress Notes (Signed)
Pt. Decided not to wear cpap tonight. RT informed pt. To notify if she changes her mind.

## 2014-11-13 NOTE — Progress Notes (Addendum)
Patient ID: Krista KalataMary O Wise, female   DOB: 1945/10/15, 69 y.o.   MRN: [id number]   SUBJECTIVE: Stable today.  No chest pain, no dyspnea.  UOP improving per patient and creatinine down.  I/Os not recorded. Cardiolite 4/20 with inferolateral infarct + peri-infarct ischemia.    Scheduled Meds: . aspirin  81 mg Oral Daily  . atorvastatin  40 mg Oral q1800  . chlorhexidine  15 mL Mouth Rinse BID  . darbepoetin (ARANESP) injection - NON-DIALYSIS  40 mcg Subcutaneous Q Tue-1800  . DULoxetine  60 mg Oral BID  . feeding supplement (PRO-STAT SUGAR FREE 64)  30 mL Oral BID  . feeding supplement (RESOURCE BREEZE)  1 Container Oral TID BM  . heparin subcutaneous  5,000 Units Subcutaneous 3 times per day  . insulin aspart  0-9 Units Subcutaneous TID WC  . insulin glargine  16 Units Subcutaneous QHS  . lidocaine  1 patch Transdermal Q24H  . midodrine  2.5 mg Oral TID WC  . multivitamin  1 tablet Oral QHS  . pantoprazole  40 mg Oral Q12H  . predniSONE  10 mg Oral Q breakfast  . rOPINIRole  2 mg Oral QHS  . silver sulfADIAZINE   Topical Daily  . sodium bicarbonate  650 mg Oral BID   Continuous Infusions:  PRN Meds:.acetaminophen, albuterol, ondansetron (ZOFRAN) IV, sodium chloride, sodium chloride  Filed Vitals:   11/12/14 1125 11/12/14 1700 11/12/14 2130 11/13/14 0530  BP:  132/74 137/62 118/57  Pulse: 84 77 85 82  Temp:  98.4 F (36.9 C) 98.6 F (37 C) 98.6 F (37 C)  TempSrc:  Oral Oral Oral  Resp:  18 16 17   Height:      Weight:      SpO2:   93% 93%    Intake/Output Summary (Last 24 hours) at 11/13/14 0918 Last data filed at 11/13/14 0610  Gross per 24 hour  Intake      0 ml  Output      4 ml  Net     -4 ml    LABS: Basic Metabolic Panel:  Recent Labs  40/98/1104/20/16 0530 11/13/14 0642  NA 141 140  K 4.3 4.1  CL 105 105  CO2 24 23  GLUCOSE 201* 168*  BUN 73* 62*  CREATININE 3.16* 2.55*  CALCIUM 9.2 8.9  PHOS 4.2 3.5   Liver Function Tests:  Recent Labs   11/12/14 0530 11/13/14 0642  ALBUMIN 2.9* 2.8*   No results for input(s): LIPASE, AMYLASE in the last 72 hours. CBC:  Recent Labs  11/12/14 0530 11/13/14 0642  WBC 8.5 8.4  HGB 9.4* 9.1*  HCT 30.5* 29.4*  MCV 84.0 84.7  PLT 285 264   Cardiac Enzymes: No results for input(s): CKTOTAL, CKMB, CKMBINDEX, TROPONINI in the last 72 hours. BNP: Invalid input(s): POCBNP D-Dimer: No results for input(s): DDIMER in the last 72 hours. Hemoglobin A1C: No results for input(s): HGBA1C in the last 72 hours. Fasting Lipid Panel: No results for input(s): CHOL, HDL, LDLCALC, TRIG, CHOLHDL, LDLDIRECT in the last 72 hours. Thyroid Function Tests: No results for input(s): TSH, T4TOTAL, T3FREE, THYROIDAB in the last 72 hours.  Invalid input(s): FREET3 Anemia Panel: No results for input(s): VITAMINB12, FOLATE, FERRITIN, TIBC, IRON, RETICCTPCT in the last 72 hours.   PHYSICAL EXAM General: NAD Neck: No JVD, no thyromegaly or thyroid nodule.  Lungs: Clear to auscultation bilaterally with normal respiratory effort. CV: Nondisplaced PMI.  Heart regular S1/S2, no S3/S4, no murmur.  No  peripheral edema.   Abdomen: Soft, nontender, no hepatosplenomegaly, no distention.  Neurologic: Alert and oriented x 3.  Psych: Normal affect. Extremities: No clubbing or cyanosis.   TELEMETRY: Reviewed telemetry pt in NSR  ASSESSMENT AND PLAN: 69 yo with history of CKD, diastolic CHF, and CAD was admitted with hypotension/suspected septic shock, AKI, and respiratory failure. Initially intubated and on pressors.  Had CVVH then HD, now off RRT.  TnI to 0.08.  1. AKI on CKD: In setting of septic shock.  Creatinine improving and she is having reasonable UOP without RRT.  Unfortunately, UOP not being recorded.  Will ask nursing to follow I/Os.  2. Shock: Suspected septic shock initially, now resolved.  Has had low BP since, now on low dose midodrine.  BP ok today.   - Try off midodrine today, check orthostatics.  -  Placed compression stockings.  3. CAD: Mild troponin elevation at admission in setting of hypotension/AKI.  Suspect demand ischemia.  No chest pain.  Cardiolite with inferolateral scar + peri-infarct ischemia.  She has had a lateral defect on prior studies.  Not sure that there has been a significant change.  Would not pursue cath at this time with recovering AKI and no chest pain.   - Continue ASA and statin.  - Will try to add low dose metoprolol 12.5 mg bid.  4. Chronic primarily diastolic CHF: EF 16% by echo, 10% by Cardiolite.  On exam today, not significantly volume overloaded. She has been on torsemide 60 mg daily chronically at home, will need to restart this at some point.  Will follow UOP today.  5. AKI on CKD: Suspect ATN in setting of hypotension at admission.  Initially required RRT, now improving.  Creatinine coming down, UOP improving per report though not measured.  - Measure I/Os.  6. OHS/OSA: Cannot tolerate Bipap or CPAP. Needs oxygen at night.   Krista Wise 11/13/2014 10:08 AM
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
## 2014-11-14 ENCOUNTER — Inpatient Hospital Stay (HOSPITAL_COMMUNITY)
Admission: RE | Admit: 2014-11-14 | Discharge: 2014-11-25 | DRG: 091 | Disposition: A | Discharge status: Home Health | Payer: Medicare Other | Source: Intra-hospital | Attending: Physical Medicine & Rehabilitation | Admitting: Physical Medicine & Rehabilitation

## 2014-11-14 DIAGNOSIS — Z955 Presence of coronary angioplasty implant and graft: Secondary | ICD-10-CM

## 2014-11-14 DIAGNOSIS — I5042 Chronic combined systolic (congestive) and diastolic (congestive) heart failure: Secondary | ICD-10-CM

## 2014-11-14 DIAGNOSIS — N179 Acute kidney failure, unspecified: Secondary | ICD-10-CM | POA: Diagnosis not present

## 2014-11-14 DIAGNOSIS — A047 Enterocolitis due to Clostridium difficile: Secondary | ICD-10-CM | POA: Diagnosis not present

## 2014-11-14 DIAGNOSIS — I5030 Unspecified diastolic (congestive) heart failure: Secondary | ICD-10-CM

## 2014-11-14 DIAGNOSIS — F329 Major depressive disorder, single episode, unspecified: Secondary | ICD-10-CM | POA: Diagnosis not present

## 2014-11-14 DIAGNOSIS — N189 Chronic kidney disease, unspecified: Secondary | ICD-10-CM | POA: Diagnosis not present

## 2014-11-14 DIAGNOSIS — I5032 Chronic diastolic (congestive) heart failure: Secondary | ICD-10-CM | POA: Diagnosis not present

## 2014-11-14 DIAGNOSIS — I10 Essential (primary) hypertension: Secondary | ICD-10-CM | POA: Diagnosis not present

## 2014-11-14 DIAGNOSIS — G7281 Critical illness myopathy: Secondary | ICD-10-CM | POA: Diagnosis present

## 2014-11-14 DIAGNOSIS — I951 Orthostatic hypotension: Secondary | ICD-10-CM

## 2014-11-14 DIAGNOSIS — N19 Unspecified kidney failure: Secondary | ICD-10-CM | POA: Insufficient documentation

## 2014-11-14 DIAGNOSIS — R350 Frequency of micturition: Secondary | ICD-10-CM

## 2014-11-14 DIAGNOSIS — IMO0002 Reserved for concepts with insufficient information to code with codable children: Secondary | ICD-10-CM | POA: Diagnosis present

## 2014-11-14 DIAGNOSIS — G4733 Obstructive sleep apnea (adult) (pediatric): Secondary | ICD-10-CM

## 2014-11-14 DIAGNOSIS — E1142 Type 2 diabetes mellitus with diabetic polyneuropathy: Secondary | ICD-10-CM

## 2014-11-14 DIAGNOSIS — E1165 Type 2 diabetes mellitus with hyperglycemia: Secondary | ICD-10-CM | POA: Diagnosis present

## 2014-11-14 DIAGNOSIS — E785 Hyperlipidemia, unspecified: Secondary | ICD-10-CM | POA: Diagnosis not present

## 2014-11-14 DIAGNOSIS — I214 Non-ST elevation (NSTEMI) myocardial infarction: Secondary | ICD-10-CM

## 2014-11-14 DIAGNOSIS — R040 Epistaxis: Secondary | ICD-10-CM

## 2014-11-14 DIAGNOSIS — J9601 Acute respiratory failure with hypoxia: Secondary | ICD-10-CM | POA: Insufficient documentation

## 2014-11-14 DIAGNOSIS — R6521 Severe sepsis with septic shock: Secondary | ICD-10-CM | POA: Diagnosis not present

## 2014-11-14 DIAGNOSIS — I2510 Atherosclerotic heart disease of native coronary artery without angina pectoris: Secondary | ICD-10-CM

## 2014-11-14 DIAGNOSIS — D649 Anemia, unspecified: Secondary | ICD-10-CM

## 2014-11-14 DIAGNOSIS — A419 Sepsis, unspecified organism: Secondary | ICD-10-CM | POA: Diagnosis not present

## 2014-11-14 DIAGNOSIS — E1121 Type 2 diabetes mellitus with diabetic nephropathy: Secondary | ICD-10-CM | POA: Diagnosis not present

## 2014-11-14 LAB — BASIC METABOLIC PANEL
Anion gap: 12 (ref 5–15)
BUN: 57 mg/dL — ABNORMAL HIGH (ref 6–23)
CO2: 24 mmol/L (ref 19–32)
Calcium: 8.9 mg/dL (ref 8.4–10.5)
Chloride: 102 mmol/L (ref 96–112)
Creatinine, Ser: 2.42 mg/dL — ABNORMAL HIGH (ref 0.50–1.10)
GFR calc non Af Amer: 20 mL/min — ABNORMAL LOW (ref >90)
GFR, EST AFRICAN AMERICAN: 23 mL/min — AB (ref >90)
GLUCOSE: 191 mg/dL — AB (ref 70–99)
POTASSIUM: 4.1 mmol/L (ref 3.5–5.1)
Sodium: 138 mmol/L (ref 135–145)

## 2014-11-14 LAB — CBC WITH DIFFERENTIAL/PLATELET
BASOS ABS: 0 10*3/uL (ref 0.0–0.1)
Basophils Relative: 0 % (ref 0–1)
Eosinophils Absolute: 0.1 10*3/uL (ref 0.0–0.7)
Eosinophils Relative: 1 % (ref 0–5)
HEMATOCRIT: 30.2 % — AB (ref 36.0–46.0)
HEMOGLOBIN: 9.3 g/dL — AB (ref 12.0–15.0)
Lymphocytes Relative: 7 % — ABNORMAL LOW (ref 12–46)
Lymphs Abs: 0.7 10*3/uL (ref 0.7–4.0)
MCH: 26.2 pg (ref 26.0–34.0)
MCHC: 30.8 g/dL (ref 30.0–36.0)
MCV: 85.1 fL (ref 78.0–100.0)
MONO ABS: 0.3 10*3/uL (ref 0.1–1.0)
Monocytes Relative: 4 % (ref 3–12)
NEUTROS ABS: 7.9 10*3/uL — AB (ref 1.7–7.7)
Neutrophils Relative %: 88 % — ABNORMAL HIGH (ref 43–77)
Platelets: 248 10*3/uL (ref 150–400)
RBC: 3.55 MIL/uL — ABNORMAL LOW (ref 3.87–5.11)
RDW: 20.5 % — ABNORMAL HIGH (ref 11.5–15.5)
WBC: 8.9 10*3/uL (ref 4.0–10.5)

## 2014-11-14 LAB — COMPREHENSIVE METABOLIC PANEL
ALT: 7 U/L (ref 0–35)
AST: 14 U/L (ref 0–37)
Albumin: 3 g/dL — ABNORMAL LOW (ref 3.5–5.2)
Alkaline Phosphatase: 68 U/L (ref 39–117)
Anion gap: 11 (ref 5–15)
BUN: 57 mg/dL — ABNORMAL HIGH (ref 6–23)
CALCIUM: 8.8 mg/dL (ref 8.4–10.5)
CHLORIDE: 99 mmol/L (ref 96–112)
CO2: 25 mmol/L (ref 19–32)
Creatinine, Ser: 2.36 mg/dL — ABNORMAL HIGH (ref 0.50–1.10)
GFR calc Af Amer: 24 mL/min — ABNORMAL LOW (ref >90)
GFR calc non Af Amer: 20 mL/min — ABNORMAL LOW (ref >90)
GLUCOSE: 277 mg/dL — AB (ref 70–99)
Potassium: 4.8 mmol/L (ref 3.5–5.1)
SODIUM: 135 mmol/L (ref 135–145)
Total Bilirubin: 0.4 mg/dL (ref 0.3–1.2)
Total Protein: 6.2 g/dL (ref 6.0–8.3)

## 2014-11-14 LAB — GLUCOSE, CAPILLARY
GLUCOSE-CAPILLARY: 224 mg/dL — AB (ref 70–99)
Glucose-Capillary: 181 mg/dL — ABNORMAL HIGH (ref 70–99)
Glucose-Capillary: 264 mg/dL — ABNORMAL HIGH (ref 70–99)
Glucose-Capillary: 205 mg/dL — ABNORMAL HIGH (ref 70–99)

## 2014-11-14 MED ORDER — SILVER SULFADIAZINE 1 % EX CREA
TOPICAL_CREAM | Freq: Every day | CUTANEOUS | Status: DC
  Administered 2014-11-15: 14:00:00 via TOPICAL
  Administered 2014-11-16: 1 via TOPICAL
  Administered 2014-11-17 – 2014-11-21 (×5): via TOPICAL
  Administered 2014-11-23: 1 via TOPICAL
  Administered 2014-11-24: 11:00:00 via TOPICAL
  Administered 2014-11-25: 1 via TOPICAL
  Filled 2014-11-14: qty 85

## 2014-11-14 MED ORDER — INSULIN GLARGINE 100 UNIT/ML ~~LOC~~ SOLN: 25.0000 [IU] | Freq: Every day | SUBCUTANEOUS | Status: DC

## 2014-11-14 MED ORDER — BOOST / RESOURCE BREEZE PO LIQD: 1.0000 | Freq: Three times a day (TID) | ORAL | Status: DC

## 2014-11-14 MED ORDER — HEPARIN SODIUM (PORCINE) 5000 UNIT/ML IJ SOLN
5000.0000 [IU] | Freq: Three times a day (TID) | INTRAMUSCULAR | Status: DC
  Administered 2014-11-14 – 2014-11-25 (×32): 5000 [IU] via SUBCUTANEOUS
  Filled 2014-11-14 (×35): qty 1

## 2014-11-14 MED ORDER — ONDANSETRON HCL 4 MG PO TABS: 4.0000 mg | ORAL_TABLET | Freq: Four times a day (QID) | ORAL | Status: DC | PRN

## 2014-11-14 MED ORDER — SORBITOL 70 % SOLN: 30.0000 mL | Freq: Every day | Status: DC | PRN

## 2014-11-14 MED ORDER — LIDOCAINE 5 % EX PTCH
1.0000 | MEDICATED_PATCH | CUTANEOUS | Status: DC
  Administered 2014-11-15 – 2014-11-24 (×9): 1 via TRANSDERMAL
  Filled 2014-11-14 (×11): qty 1

## 2014-11-14 MED ORDER — PRO-STAT SUGAR FREE PO LIQD: 30.0000 mL | Freq: Two times a day (BID) | ORAL | Status: DC

## 2014-11-14 MED ORDER — ATORVASTATIN CALCIUM 40 MG PO TABS
40.0000 mg | ORAL_TABLET | Freq: Every day | ORAL | Status: DC
  Administered 2014-11-14 – 2014-11-24 (×11): 40 mg via ORAL
  Filled 2014-11-14 (×12): qty 1

## 2014-11-14 MED ORDER — SODIUM BICARBONATE 650 MG PO TABS: 650.0000 mg | ORAL_TABLET | Freq: Two times a day (BID) | ORAL | Status: DC

## 2014-11-14 MED ORDER — INSULIN ASPART 100 UNIT/ML ~~LOC~~ SOLN
0.0000 [IU] | Freq: Three times a day (TID) | SUBCUTANEOUS | Status: DC
  Administered 2014-11-14 – 2014-11-15 (×2): 5 [IU] via SUBCUTANEOUS
  Administered 2014-11-15: 3 [IU] via SUBCUTANEOUS
  Administered 2014-11-15: 2 [IU] via SUBCUTANEOUS
  Administered 2014-11-16: 5 [IU] via SUBCUTANEOUS
  Administered 2014-11-16: 7 [IU] via SUBCUTANEOUS
  Administered 2014-11-16 – 2014-11-17 (×2): 3 [IU] via SUBCUTANEOUS
  Administered 2014-11-17: 2 [IU] via SUBCUTANEOUS
  Administered 2014-11-17: 5 [IU] via SUBCUTANEOUS
  Administered 2014-11-18 (×2): 2 [IU] via SUBCUTANEOUS
  Administered 2014-11-18 – 2014-11-19 (×2): 5 [IU] via SUBCUTANEOUS
  Administered 2014-11-19: 1 [IU] via SUBCUTANEOUS
  Administered 2014-11-19: 3 [IU] via SUBCUTANEOUS
  Administered 2014-11-20: 2 [IU] via SUBCUTANEOUS
  Administered 2014-11-20 – 2014-11-21 (×2): 3 [IU] via SUBCUTANEOUS
  Administered 2014-11-21: 2 [IU] via SUBCUTANEOUS
  Administered 2014-11-21: 3 [IU] via SUBCUTANEOUS
  Administered 2014-11-22 – 2014-11-23 (×3): 2 [IU] via SUBCUTANEOUS
  Administered 2014-11-23: 3 [IU] via SUBCUTANEOUS
  Administered 2014-11-23 – 2014-11-24 (×2): 1 [IU] via SUBCUTANEOUS
  Administered 2014-11-24: 3 [IU] via SUBCUTANEOUS
  Administered 2014-11-24: 1 [IU] via SUBCUTANEOUS
  Administered 2014-11-25: 2 [IU] via SUBCUTANEOUS

## 2014-11-14 MED ORDER — ONDANSETRON HCL 4 MG/2ML IJ SOLN: 4.0000 mg | Freq: Four times a day (QID) | INTRAMUSCULAR | Status: DC | PRN

## 2014-11-14 MED ORDER — SODIUM BICARBONATE 650 MG PO TABS
650.0000 mg | ORAL_TABLET | Freq: Two times a day (BID) | ORAL | Status: DC
  Administered 2014-11-14 – 2014-11-25 (×22): 650 mg via ORAL
  Filled 2014-11-14 (×24): qty 1

## 2014-11-14 MED ORDER — PRO-STAT SUGAR FREE PO LIQD
30.0000 mL | Freq: Two times a day (BID) | ORAL | Status: DC
  Administered 2014-11-14 – 2014-11-19 (×8): 30 mL via ORAL
  Filled 2014-11-14 (×14): qty 30

## 2014-11-14 MED ORDER — SALINE SPRAY 0.65 % NA SOLN
1.0000 | NASAL | Status: DC | PRN
  Administered 2014-11-23 (×2): 1 via NASAL
  Filled 2014-11-14: qty 44

## 2014-11-14 MED ORDER — ASPIRIN 81 MG PO CHEW
81.0000 mg | CHEWABLE_TABLET | Freq: Every day | ORAL | Status: DC
  Administered 2014-11-15 – 2014-11-25 (×11): 81 mg via ORAL
  Filled 2014-11-14 (×13): qty 1

## 2014-11-14 MED ORDER — TORSEMIDE 20 MG PO TABS
60.0000 mg | ORAL_TABLET | Freq: Every day | ORAL | Status: DC
  Administered 2014-11-15 – 2014-11-25 (×11): 60 mg via ORAL
  Filled 2014-11-14 (×12): qty 3

## 2014-11-14 MED ORDER — DARBEPOETIN ALFA 40 MCG/0.4ML IJ SOSY: 40.0000 ug | PREFILLED_SYRINGE | INTRAMUSCULAR | Status: DC

## 2014-11-14 MED ORDER — ROPINIROLE HCL 1 MG PO TABS
2.0000 mg | ORAL_TABLET | Freq: Every day | ORAL | Status: DC
  Administered 2014-11-14 – 2014-11-24 (×11): 2 mg via ORAL
  Filled 2014-11-14 (×12): qty 2

## 2014-11-14 MED ORDER — TORSEMIDE 20 MG PO TABS: 40.0000 mg | ORAL_TABLET | Freq: Every day | ORAL | Status: DC

## 2014-11-14 MED ORDER — CHLORHEXIDINE GLUCONATE 0.12 % MT SOLN
15.0000 mL | Freq: Two times a day (BID) | OROMUCOSAL | Status: DC
  Administered 2014-11-14 – 2014-11-25 (×20): 15 mL via OROMUCOSAL
  Filled 2014-11-14 (×24): qty 15

## 2014-11-14 MED ORDER — TORSEMIDE 20 MG PO TABS
60.0000 mg | ORAL_TABLET | Freq: Every day | ORAL | Status: DC
  Administered 2014-11-14: 60 mg via ORAL
  Filled 2014-11-14: qty 3

## 2014-11-14 MED ORDER — INSULIN GLARGINE 100 UNIT/ML ~~LOC~~ SOLN
16.0000 [IU] | Freq: Every day | SUBCUTANEOUS | Status: DC
Start: 2014-11-14 — End: 2014-11-17
  Administered 2014-11-14 – 2014-11-16 (×3): 16 [IU] via SUBCUTANEOUS
  Filled 2014-11-14 (×4): qty 0.16

## 2014-11-14 MED ORDER — METOPROLOL TARTRATE 25 MG PO TABS: 12.5000 mg | ORAL_TABLET | Freq: Two times a day (BID) | ORAL | Status: DC

## 2014-11-14 MED ORDER — BOOST / RESOURCE BREEZE PO LIQD
1.0000 | Freq: Three times a day (TID) | ORAL | Status: DC
  Administered 2014-11-17 – 2014-11-18 (×2): 1 via ORAL

## 2014-11-14 MED ORDER — HEPARIN SODIUM (PORCINE) 5000 UNIT/ML IJ SOLN: 5000.0000 [IU] | Freq: Three times a day (TID) | INTRAMUSCULAR | Status: DC

## 2014-11-14 MED ORDER — POTASSIUM CHLORIDE ER 10 MEQ PO TBCR
20.0000 meq | EXTENDED_RELEASE_TABLET | Freq: Every day | ORAL | Status: DC
  Administered 2014-11-15 – 2014-11-25 (×11): 20 meq via ORAL
  Filled 2014-11-14 (×12): qty 2

## 2014-11-14 MED ORDER — DULOXETINE HCL 60 MG PO CPEP
60.0000 mg | ORAL_CAPSULE | Freq: Two times a day (BID) | ORAL | Status: DC
  Administered 2014-11-14 – 2014-11-25 (×22): 60 mg via ORAL
  Filled 2014-11-14 (×24): qty 1

## 2014-11-14 MED ORDER — METOPROLOL TARTRATE 12.5 MG HALF TABLET
12.5000 mg | ORAL_TABLET | Freq: Two times a day (BID) | ORAL | Status: DC
  Administered 2014-11-14 – 2014-11-25 (×14): 12.5 mg via ORAL
  Filled 2014-11-14 (×25): qty 1

## 2014-11-14 MED ORDER — PANTOPRAZOLE SODIUM 40 MG PO TBEC
40.0000 mg | DELAYED_RELEASE_TABLET | Freq: Two times a day (BID) | ORAL | Status: DC
Start: 2014-11-14 — End: 2014-11-25
  Administered 2014-11-14 – 2014-11-25 (×22): 40 mg via ORAL
  Filled 2014-11-14 (×23): qty 1

## 2014-11-14 MED ORDER — RENA-VITE PO TABS
1.0000 | ORAL_TABLET | Freq: Every day | ORAL | Status: DC
  Administered 2014-11-14 – 2014-11-24 (×11): 1 via ORAL
  Filled 2014-11-14 (×12): qty 1

## 2014-11-14 MED ORDER — POTASSIUM CHLORIDE CRYS ER 20 MEQ PO TBCR
20.0000 meq | EXTENDED_RELEASE_TABLET | Freq: Every day | ORAL | Status: DC
  Administered 2014-11-14: 20 meq via ORAL
  Filled 2014-11-14: qty 1

## 2014-11-14 MED ORDER — ACETAMINOPHEN 325 MG PO TABS
650.0000 mg | ORAL_TABLET | Freq: Four times a day (QID) | ORAL | Status: DC | PRN
  Administered 2014-11-16 – 2014-11-24 (×4): 650 mg via ORAL
  Filled 2014-11-14 (×4): qty 2

## 2014-11-14 MED ORDER — ALBUTEROL SULFATE (2.5 MG/3ML) 0.083% IN NEBU: 2.5000 mg | INHALATION_SOLUTION | RESPIRATORY_TRACT | Status: DC | PRN

## 2014-11-14 MED ORDER — SODIUM CHLORIDE 0.9 % IJ SOLN: 10.0000 mL | INTRAMUSCULAR | Status: DC | PRN

## 2014-11-14 MED ORDER — POTASSIUM CHLORIDE CRYS ER 20 MEQ PO TBCR: 20.0000 meq | EXTENDED_RELEASE_TABLET | Freq: Every day | ORAL | Status: DC

## 2014-11-14 MED ORDER — DARBEPOETIN ALFA 40 MCG/0.4ML IJ SOSY
40.0000 ug | PREFILLED_SYRINGE | INTRAMUSCULAR | Status: DC
  Filled 2014-11-14: qty 0.4

## 2014-11-14 NOTE — Discharge Summary (Signed)
Physician Discharge Summary  Krista Wise ZOX:[id number] DOB: August 21, 1945 DOA: 10/19/2014  PCP: [name], NP  Admit date: 10/19/2014 Discharge date: 11/14/2014  Time spent: >30 minutes  Recommendations for Outpatient Follow-up:  1. Basic metabolic panel to be repeated in 3-5 days to follow electrolytes and renal function 2. Needs close follow-up patient Bolden status, weight and fluid intake; dependent fluctuation she is going to require adjustment in her diuretic regimen if blood pressure/renal function allows it  Discharge Diagnoses:  Active Problems:   Diabetes mellitus type 2 with complications   CKD (chronic kidney disease) stage 3, GFR 30-59 ml/min   Acute respiratory failure with hypoxemia   Hypotension   CHF (congestive heart failure)   Burn of left foot   Septic shock   Acute respiratory failure   Cellulitis   Burn of right foot   Diastolic CHF, acute on chronic   Diabetes type 2, uncontrolled   HLD (hyperlipidemia)   NSTEMI (non-ST elevated myocardial infarction)   Acute renal failure with tubular necrosis   Dizziness   Acute hyperkalemia   Discharge Condition: Stable and improved. Patient will be discharged to inpatient rehabilitation unit for physical rehabilitation and conditioning.  Diet recommendation: Low sodium diet (no more than 2 g per day) and low carbohydrates diet  Filed Weights   11/11/14 0249 11/11/14 2155 11/13/14 2204  Weight: 94.84 kg (209 lb 1.4 oz) 95.27 kg (210 lb 0.5 oz) 91.491 kg (201 lb 11.2 oz)    History of present illness:  69 y/o F admitted with a 4 day history prior to admission of nausea, vomiting, diarrhea. Work up consistent with hypotension, AKI, lactic acidosis and acute respiratory failure. Please refer to H&P written by Dr. Molli Knock on 10/19/14 for further info last details on admission history.  Hospital Course:  Acute Kidney Injury, presumed ATN. -Appreciate assistance and recommendation from renal service -Cr trending  down and no HD needed since 11/01/14.  -monitor urine output and check intermittent renal function blood work to follow creatinine level -continue oral bicarb 650 mg twice a day -Renal function continued recovering. Plan to transfer patient to inpatient rehab. Patient will follow with Dr. Lowell Guitar in approximate 6 weeks or sooner if needed. -diatek to be discontinued today -Cr 2.42 on 4/22  Encephalopathy; improved.  -Might be related to renal failure, uremia -ABG negative for hypercapnia, ammonia level normal , LFT normal , TSH normal. .  -patient is AAOX3 at discharge  Epistaxis:  -resolved -Continue baby aspirin  -currently stable; will recommend monitoring   Dizziness; stable today.  -goal is to titrate midodrine off when possible.  -Patient without lightheadedness or dizziness while changing position. -MRI negative for stroke.  -Patient will be discharged to CIR for rehabilitation  Epigastric Pain/GERD; KUB negative for obstruction. -Pain improved with Protonix (patient at high risk for GERD given obesity) -will continue PPI and monitor; denies pain currently  Moderate protein malnutrition.hypoalbuminemia  She reports appetite is improving.  Continue feeding supplements  N-STEMI; Mildly elevated troponin, hypokinesis on ECHO.  -Cardiology consulted on board -continue On aspirin, metoprolol and statin.  -Stress test 4-20 with intermediate risk probability -no further CP or SOB -plan is for medical management given renal failure and hold on cath at this moment -will follow medication management rec's per cardiology  Bilateral feet/Great toe burn and cellulitis. -Dr August Saucer re-evaluated the wound. Plan to continue with local care.  -Continue with dressing changes per wound care instructions -plan is to follow with Dr. Lajoyce Corners in outpatient setting  OSA  -continue CPAP/BiPAP QHS  Acute Respiratory Failure with hypoxia  -Resolved.  -Extubated: 3-29.  -so far  breathing ok and with good O2 saturation -continue monitoring   Acute on chronic combined systolic/diastolic CHF with infiltrates (after volume resuscitation). -PCXR Consistent LLL consolidation; patient asymptomatic currently -completed 7 days of antibiotics -no signs of fluid overload, but hx of struggling with fluid overload in the past -after discussing with cardiology, will stop metolazone and use torsemide 40mg  daily -follow BP, renal function and volume status closely -low sodium diet and daily weight -no ACe/ARB given low BP and renal failure  HTN and hx of CAD s/p Stent, holding BP medications.  -Myoview with intermediate risk -EF 42% on Cardiolite and 50% on Echo -will follow cardiology rec's on medication management; Patient BP stable and plan is to continue low dose metoprolol BID and resume torsemide 40mg  -cath on hold given renal failure for now  Hypotension:  -BP significantly improved while on midodrine; now soft, but stable -patient off midodrine for 36 hours and tolerating use of metoprolol 12.5 mg BID through that time -follow VS   Mild Anemia  -Chronic and most likely associated with CKD -Monitor Hgb trend -per renal recommendations started on Aranesp  -continue ferrous sulfate  -Hgb at discharge 9.1  Diabetes type 2 uncontrolled -Continue with lantus 25 units at discharge  -Continue NovoLog rapid insulin 5 units 3 times a day with meals -Hemoglobin A1c= 7.6 -Advised to follow a low carbohydrates diet and will require further adjustment on her hypoglycemic regimen depending on CBGs fluctuation.  Lipid panel -Within ADA guidelines -continue with Lipitor 40 mg daily  Left wrist pain -x ray shows degenerative changes -uric acid slightly elevated- great response to prednisone therapy -no swelling, no pain and no redness on exam  Septic Shock on admission. -Culture data negative. Received antibiotics for 7 days.  -Presents with nausea, vomiting,  abdominal pain. Might be related to viral infection, presumably gastroenteritis.  -septic shock feature resolved -Patient with a still vital signs at discharge  Physical deconditioning: will discharge to CIR for rehabilitation and conditioning  Procedure/Significant Events: 3/27 ECHO >> EF 50-55%, inferiorlateral wall motion abnormality, PASP ~50 INTUBATED CT abd 3/28 >> cholelithiasis, abdominal and pelvic ascites, bibasilar atx, severe CAD 3/27 Admit with 4 day hx of N/V/D. Hypotensive / hypoxic in ER, intubated.  3/29 - extubated 10/24/14: confused 10/26/14: continues negative i/o and decreased wieght. 4/6 PCXR;Persistent left lower lobe consolidation.   Consultations: Dr.Murali Marchelle Gearingamaswamy (PCCM) Dr.Michael Briant CedarMattingly (nephrology) Vascular Cardiology (Dr. Ronelle NighMaclean)  Discharge Exam: Filed Vitals:   11/14/14 1111  BP:   Pulse: 75  Temp: 98.6 F (37 C)  Resp: 16   General: Afebrile, no chest pain and no shortness of breath; overall feeling better. BP has remained stable after beta blocker initiated on 4/21. No orthostatic changes Lungs: Clear, no wheezing, no crackles; good air movement  Cardiovascular: rrr, no JVD, no rubs or gallops Abdomen: Morbidly obese, +BS, soft, NT, ND  Extremities: Left foot with dressing in place. Left wrist with no redness, pain or swelling Neuro; alert, following command. No focal deficit   Discharge Instructions   Discharge Instructions    Diet - low sodium heart healthy    Complete by:  As directed      Discharge instructions    Complete by:  As directed   Follow low sodium/low carbohydrates diet (No more than 2 g of sodium per day) Close follow-up patient's weight and fluid intake Please repeat basic  metabolic panel in the next 3-5 days to follow on patient renal function and electrolytes Close follow-up to patient's volume status and if needed/adequately given patient renal function and blood pressure increase torsemide to 60 mg by mouth  daily.          Current Discharge Medication List    START taking these medications   Details  Amino Acids-Protein Hydrolys (FEEDING SUPPLEMENT, PRO-STAT SUGAR FREE 64,) LIQD Take 30 mLs by mouth 2 (two) times daily. Qty: 900 mL, Refills: 0    Darbepoetin Alfa (ARANESP) 40 MCG/0.4ML SOSY injection Inject 0.4 mLs (40 mcg total) into the skin every Tuesday at 6 PM. Qty: 8.4 mL, Refills: 0    feeding supplement, RESOURCE BREEZE, (RESOURCE BREEZE) LIQD Take 1 Container by mouth 3 (three) times daily between meals. Refills: 0    metoprolol tartrate (LOPRESSOR) 25 MG tablet Take 0.5 tablets (12.5 mg total) by mouth 2 (two) times daily.    potassium chloride SA (K-DUR,KLOR-CON) 20 MEQ tablet Take 1 tablet (20 mEq total) by mouth daily.    silver sulfADIAZINE (SILVADENE) 1 % cream Apply 1 application topically daily. Wash left foot with soap and water daily. Apply Silvadene with gauze circumferentially around her toes change daily Qty: 50 g, Refills: 0    sodium bicarbonate 650 MG tablet Take 1 tablet (650 mg total) by mouth 2 (two) times daily.      CONTINUE these medications which have CHANGED   Details  insulin glargine (LANTUS) 100 UNIT/ML injection Inject 0.25 mLs (25 Units total) into the skin at bedtime. Qty: 10 mL, Refills: 11    torsemide (DEMADEX) 20 MG tablet Take 2 tablets (40 mg total) by mouth daily. TAKE THREE TABLETS BY MOUTH IN THE MORNING Qty: 90 tablet, Refills: 6      CONTINUE these medications which have NOT CHANGED   Details  aspirin EC 81 MG tablet Take 1 tablet (81 mg total) by mouth daily.    atorvastatin (LIPITOR) 40 MG tablet Take 40 mg by mouth daily at 6 PM.     Cholecalciferol (VITAMIN D3) 1000 UNITS CAPS Take 1,000 Units by mouth daily.    DULoxetine (CYMBALTA) 60 MG capsule Take 60 mg by mouth 2 (two) times daily.     ferrous sulfate 325 (65 FE) MG tablet Take 1 tablet (325 mg total) by mouth 2 (two) times daily. Qty: 60 tablet, Refills: 3    Associated Diagnoses: Anemia    insulin aspart (NOVOLOG) 100 UNIT/ML injection Inject 5 Units into the skin 3 (three) times daily before meals. For cbg >=150 Qty: 3 vial, Refills: PRN   Associated Diagnoses: Diabetes mellitus type 2 with complications    Multiple Vitamin (MULTIVITAMIN WITH MINERALS) TABS Take 1 tablet by mouth daily.    pantoprazole (PROTONIX) 40 MG tablet Take 40 mg by mouth daily.    rOPINIRole (REQUIP) 2 MG tablet Take 2 mg by mouth at bedtime.       STOP taking these medications     metolazone (ZAROXOLYN) 2.5 MG tablet      bisoprolol (ZEBETA) 5 MG tablet      gabapentin (NEURONTIN) 400 MG capsule      ibuprofen (ADVIL,MOTRIN) 200 MG tablet      metFORMIN (GLUCOPHAGE) 1000 MG tablet      oxybutynin (DITROPAN-XL) 10 MG 24 hr tablet      traZODone (DESYREL) 50 MG tablet        No Known Allergies Follow-up Information    Follow up with  DUDA,MARCUS V, MD In 2 weeks.   Specialty:  Orthopedic Surgery   Contact information:   [address] [place] [address] [phone]       The results of significant diagnostics from this hospitalization (including imaging, microbiology, ancillary and laboratory) are listed below for reference.    Significant Diagnostic Studies: Ct Abdomen Pelvis Wo Contrast  10/20/2014   CLINICAL DATA:  Nausea, vomiting, diarrhea.  EXAM: CT ABDOMEN AND PELVIS WITHOUT CONTRAST  TECHNIQUE: Multidetector CT imaging of the abdomen and pelvis was performed following the standard protocol without IV contrast.  COMPARISON:  None.  FINDINGS: Mild cardiomegaly. Diffuse dense coronary artery calcifications. Patchy densities in both lung bases, most likely atelectasis. No pleural effusions.  NG tube tip is in the proximal stomach. Layering gallstones within the gallbladder. No visible focal abnormality within the liver, spleen, pancreas. Mild fullness of the adrenal glands bilaterally compatible with hyperplasia. Punctate nonobstructing  left renal stones. No renal on the right. No ureteral stones or hydronephrosis. Bilateral perinephric stranding noted.  There is mild perihepatic and perisplenic ascites. Moderate ascites in the pelvis. Urinary bladder is decompressed with Foley catheter in place. Stomach, large and small bowel grossly unremarkable. No acute bony abnormality or focal bone lesion. Degenerative scratch head postoperative changes in the lower lumbar spine. Grade 1 anterolisthesis of L4 on L5 and L5 on S1. Posterior fusion changes from L4-S1.  IMPRESSION: Cholelithiasis.  Moderate abdominal and pelvic ascites.  Left nephrolithiasis. Bilateral perinephric stranding presumably related to remote process. No hydronephrosis.  Bibasilar opacities, likely atelectasis.  Severe coronary artery disease.   Electronically Signed   By: Charlett Nose M.D.   On: 10/20/2014 13:26   Dg Wrist Complete Left  11/01/2014   CLINICAL DATA:  Left wrist pain starting yesterday  EXAM: LEFT WRIST - COMPLETE 3+ VIEW  COMPARISON:  None.  FINDINGS: Four views of the left wrist submitted. No acute fracture or subluxation. There is narrowing of radiocarpal joint space. Degenerative changes first carpometacarpal joint.  IMPRESSION: No acute fracture or subluxation. Degenerative changes as described above.   Electronically Signed   By: Natasha Mead M.D.   On: 11/01/2014 13:01   Dg Abd 1 View  11/07/2014   CLINICAL DATA:  Pt states she has had abdominal pain for the last few days. Pt had a regular BM this morning and is not constipated- states the pain is unrelated to bowels.  EXAM: ABDOMEN - 1 VIEW  COMPARISON:  CT, 10/20/2014.  FINDINGS: Moderate gaseous distention of the stomach. Normal bowel gas pattern. No evidence of obstruction.  Soft tissues are unremarkable.  There stable changes from a lower lumbar spine posterior fusion.  IMPRESSION: 1. No acute findings. 2. No evidence of bowel obstruction or generalized adynamic ileus.   Electronically Signed   By: Amie Portland M.D.   On: 11/07/2014 16:28   Mr Brain Wo Contrast  11/07/2014   CLINICAL DATA:  Four day history of vomiting, diarrhea and weakness. Dizziness.  EXAM: MRI HEAD WITHOUT CONTRAST  TECHNIQUE: Multiplanar, multiecho pulse sequences of the brain and surrounding structures were obtained without intravenous contrast.  COMPARISON:  MRA intracranial 10/1913.  CT head 02/07/2012.  FINDINGS: No evidence for acute infarction, hemorrhage, mass lesion, hydrocephalus, or extra-axial fluid. Moderate cerebral and cerebellar atrophy, premature for age. Minimal white matter disease. Flow voids are maintained throughout the carotid, basilar, and vertebral arteries. There are no areas of chronic hemorrhage. Pituitary, pineal, and cerebellar tonsils unremarkable. No upper cervical lesions.  BILATERAL cataract extraction. No sinus or mastoid disease. Extracranial soft tissues otherwise unremarkable.  Compared with prior CT, further cerebral volume loss has occurred.  IMPRESSION: Moderate cerebral and cerebellar atrophy progressive from 2013.  No acute intracranial findings.   Electronically Signed   By: Davonna Belling M.D.   On: 11/07/2014 09:54   Nm Myocar Multi W/spect W/wall Motion / Ef  11/12/2014   CLINICAL DATA:  NSTEMI. History of coronary artery disease. Prior coronary artery stenting.  EXAM: MYOCARDIAL IMAGING WITH SPECT (REST AND PHARMACOLOGIC-STRESS)  GATED LEFT VENTRICULAR WALL MOTION STUDY  LEFT VENTRICULAR EJECTION FRACTION  TECHNIQUE: Standard myocardial SPECT imaging was performed after resting intravenous injection of 10 mCi Tc-40m sestamibi. Subsequently, intravenous infusion of Lexiscan was performed under the supervision of the Cardiology staff. At peak effect of the drug, 30 mCi Tc-76m sestamibi was injected intravenously and standard myocardial SPECT imaging was performed. Quantitative gated imaging was also performed to evaluate left ventricular wall motion, and estimate left ventricular ejection  fraction.  COMPARISON:  None.  FINDINGS: Perfusion: There is concern for mild reversibility along the inferolateral apex. There is fixed decreased uptake along the mid and basal segments of the inferolateral wall.  Wall Motion: Diffuse mild hypokinesia without focal abnormality.  Left Ventricular Ejection Fraction: 42 %  End diastolic volume 132 ml  End systolic volume 76 ml  IMPRESSION: 1. Concern for mild reversibility and ischemia along the apical segment of the inferolateral wall. Fixed defects involving the mid and basilar segments of the inferolateral wall could represent an area of infarct.  2. Diffuse mild hypokinesia.  3. Left ventricular ejection fraction is 42%.  4. Intermediate-risk stress test findings*.  *2012 Appropriate Use Criteria for Coronary Revascularization Focused Update: J Am Coll Cardiol. 2012;59(9):857-881. http://content.dementiazones.com.aspx?articleid=1201161  These results will be called to the ordering clinician or representative by the Radiologist Assistant, and communication documented in the PACS or zVision Dashboard.   Electronically Signed   By: Richarda Overlie M.D.   On: 11/12/2014 16:54   Dg Chest Port 1 View  11/07/2014   CLINICAL DATA:  Shortness of Breath  EXAM: PORTABLE CHEST - 1 VIEW  COMPARISON:  November 03, 2014  FINDINGS: Dual-lumen catheter present with tips in the superior vena cava. No pneumothorax. There is cardiomegaly with pulmonary venous hypertension and interstitial edema. There is no appreciable airspace consolidation. No adenopathy.  IMPRESSION: Evidence of congestive heart failure.  No pneumothorax.   Electronically Signed   By: Bretta Bang III M.D.   On: 11/07/2014 17:18   Dg Chest Port 1 View  11/03/2014   CLINICAL DATA:  New dialysis catheter.  EXAM: PORTABLE CHEST - 1 VIEW  COMPARISON:  October 29, 2014.  FINDINGS: Stable cardiomegaly. No pneumothorax or pleural effusion is noted. No acute pulmonary disease is noted. New right internal jugular  dialysis catheter is noted with distal tip overlying the expected position of the SVC. Bony thorax is intact.  IMPRESSION: New right internal jugular dialysis catheter is noted. No pneumothorax is noted. No acute cardiopulmonary abnormality seen.   Electronically Signed   By: Lupita Raider, M.D.   On: 11/03/2014 09:49   Dg Chest Port 1 View  10/29/2014   CLINICAL DATA:  Shortness of Breath  EXAM: PORTABLE CHEST - 1 VIEW  COMPARISON:  October 27, 2014  FINDINGS: There is persistent airspace consolidation in the left lower lobe. Lungs elsewhere clear. Heart is mildly enlarged with pulmonary vascularity within normal limits. No adenopathy. Central catheter tip is  in the superior cava. No pneumothorax.  IMPRESSION: Persistent left lower lobe consolidation. No new opacity. No change in cardiac silhouette.   Electronically Signed   By: Bretta Bang III M.D.   On: 10/29/2014 07:17   Dg Chest Port 1 View  10/27/2014   CLINICAL DATA:  Respiratory failure  EXAM: PORTABLE CHEST - 1 VIEW  COMPARISON:  October 26, 2014  FINDINGS: There is consolidation in the left lower lobe. Lungs are otherwise clear. Heart is mildly enlarged with pulmonary vascularity within normal limits. Central catheter tip is in the superior vena cava. No pneumothorax. No adenopathy.  IMPRESSION: Left lower lobe consolidation. Central catheter tip in superior vena cava. No pneumothorax. Heart prominent but stable.   Electronically Signed   By: Bretta Bang III M.D.   On: 10/27/2014 07:25   Dg Chest Port 1 View  10/26/2014   CLINICAL DATA:  Congestive heart failure. Shortness of breath. Coronary artery disease.  EXAM: PORTABLE CHEST - 1 VIEW  COMPARISON:  10/24/2014  FINDINGS: Right jugular central venous catheter remains in appropriate position. Cardiomegaly is stable. There is mild improvement in bilateral airspace disease, consistent with mild improvement in pulmonary edema. Decreased left pleural effusion and/or retrocardiac atelectasis also  noted since prior exam.  IMPRESSION: Mild improvement in diffuse pulmonary edema and left retrocardiac atelectasis and/or effusion.   Electronically Signed   By: Myles Rosenthal M.D.   On: 10/26/2014 08:36   Dg Chest Port 1 View  10/24/2014   CLINICAL DATA:  Acute respiratory failure.  EXAM: PORTABLE CHEST - 1 VIEW  COMPARISON:  10/22/2014 and 10/21/2014 and 10/19/2014  FINDINGS: Central catheter in good position just below the level of the carina in the superior vena cava.  Recurrent bilateral pulmonary edema.  Persistent left effusion.  IMPRESSION: Recurrent bilateral pulmonary edema.  Small left effusion.   Electronically Signed   By: Francene Boyers M.D.   On: 10/24/2014 07:44   Dg Chest Port 1 View  10/22/2014   CLINICAL DATA:  CHF  EXAM: PORTABLE CHEST - 1 VIEW  COMPARISON:  10/21/2014  FINDINGS: Endotracheal and orogastric tubes have been removed. Right IJ central line is in good position.  Improved pulmonary edema. There is still infrahilar airspace opacity and a small left pleural effusion. No pneumothorax.  IMPRESSION: 1. Improved pulmonary edema. 2. Stable infrahilar lung opacities, likely atelectasis based on recent abdominal CT.   Electronically Signed   By: Marnee Spring M.D.   On: 10/22/2014 07:38   Dg Chest Port 1 View  10/21/2014   CLINICAL DATA:  Hypoxia  EXAM: PORTABLE CHEST - 1 VIEW  COMPARISON:  October 19, 2013  FINDINGS: Endotracheal tube tip is 3.0 cm above the carina. Central catheter tip is in the superior vena cava near the cavoatrial junction. Nasogastric tube tip and side port are below the diaphragm. No pneumothorax. There is patchy interstitial edema bilaterally, stable. There is no appreciable airspace consolidation. Heart is enlarged with mild pulmonary venous hypertension.  IMPRESSION: Tube and catheter positions as described without pneumothorax. Evidence of a degree of congestive heart failure. No airspace consolidation.   Electronically Signed   By: Bretta Bang III M.D.    On: 10/21/2014 07:32   Dg Chest Port 1 View  10/20/2014   CLINICAL DATA:  Respiratory failure.  EXAM: PORTABLE CHEST - 1 VIEW  COMPARISON:  10/19/2014  FINDINGS: Endotracheal tube tip between the clavicular heads and carina. Gastric suction tube reaches the stomach. There is a right IJ  central line, tip at the upper cavoatrial junction.  Stable cardiopericardial enlargement with particular prominence of the , both pleural effusion and lung opacity.  IMPRESSION: 1. Unchanged positioning of tubes and central line. 2. Unchanged retrocardiac atelectasis or pneumonia with effusion. 3. Pulmonary venous congestion.   Electronically Signed   By: Marnee Spring M.D.   On: 10/20/2014 07:38   Dg Chest Portable 1 View  10/19/2014   CLINICAL DATA:  69 year old female status post hemodialysis catheter placement.  EXAM: PORTABLE CHEST - 1 VIEW  COMPARISON:  Chest x-ray 10/19/2014.  FINDINGS: An endotracheal tube is in place with tip 3.2 cm above the carina. A nasogastric tube is seen extending into the stomach, however, the tip of the nasogastric tube extends below the lower margin of the image. Right IJ vas cath in position with tip terminating at the superior cavoatrial junction. Lung volumes are low. Diffuse interstitial prominence. Patchy bibasilar opacities may reflect areas of atelectasis and/or consolidation (left greater than right). Moderate left pleural effusion. Cardiomegaly. The patient is rotated to the left on today's exam, resulting in distortion of the mediastinal contours and reduced diagnostic sensitivity and specificity for mediastinal pathology. Atherosclerosis in the thoracic aorta.  IMPRESSION: 1. Support apparatus, as above. 2. Enlarging moderate left pleural effusion with bibasilar opacities (left greater than right), which may reflect areas of atelectasis and/or consolidation. 3. Cardiomegaly. 4. Atherosclerosis.   Electronically Signed   By: Trudie Reed M.D.   On: 10/19/2014 17:00   Dg  Chest Portable 1 View  10/19/2014   CLINICAL DATA:  Endotracheal tube placement  EXAM: PORTABLE CHEST - 1 VIEW  COMPARISON:  Prior film same day  FINDINGS: Cardiomegaly again noted. Again noted left basilar atelectasis or infiltrate. Endotracheal tube in place with tip 2.8 cm above the carina. No pneumothorax.  IMPRESSION: Cardiomegaly again noted. Endotracheal tube in place. No pneumothorax. Persistent left basilar atelectasis or infiltrate.   Electronically Signed   By: Natasha Mead M.D.   On: 10/19/2014 15:16   Dg Chest Portable 1 View  10/19/2014   CLINICAL DATA:  Sepsis.  Nausea, vomiting and diarrhea.  EXAM: PORTABLE CHEST - 1 VIEW  COMPARISON:  07/18/2014  FINDINGS: Stable cardiac enlargement. There is tortuosity, ectasia and calcification of the thoracic aorta. Increased left lower lobe density without obvious effusion. Could not exclude an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

## 2014-11-14 NOTE — Progress Notes (Signed)
[facility] Catheter Removal Procedure Note  Diagnosis: ESRD with Functioning kidney post transplant  Plan:  Remove right diatek catheter  Consent signed:  yes Time out completed:  yes Coumadin:  No. PT/INR (if applicable):   Other labs:   Procedure: 1.  Sterile prepping and draping over catheter area 2. 0 ml 2% lidocaine plain instilled at removal site. 3.  right catheter removed in its entirety with cuff in tact. 4.  Complications: none  5. Tip of catheter sent for culture:  no   Patient tolerated procedure well:  yes Pressure held, no bleeding noted, dressing applied Instructions given to the pt regarding wound care and bleeding. Do not lay flat for 4 hours today you may remove the dressing in 24 hours and shower.  Place clean dry dressing over diatek exit wound as needed.  OtherClinton Gallant:  COLLINS, EMMA [facility] 11/14/2014 1:34 PM
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
## 2014-11-14 NOTE — Progress Notes (Signed)
PT Cancellation Note  Patient Details Name: Krista KalataMary O Gubbels MRN: [id number] DOB: Feb 23, 1946   Cancelled Treatment:    Reason Eval/Treat Not Completed: Patient at procedure or test/unavailable;Fatigue/lethargy limiting ability to participate.  Patient at procedure in am.  In pm, patient reports she if fatigued.  Wants to "save energy" for Inpatient Rehab - to be admitted today.   Vena AustriaDavis, Mourad Cwikla H 11/14/2014, 4:25 PM Durenda HurtSusan H. Renaldo Fiddleravis, PT, [facility] Acute Rehab Services Pager [phone]

## 2014-11-14 NOTE — Progress Notes (Signed)
Patient ID: Krista Wise, female   DOB: 15-Aug-1945, 69 y.o.   MRN: [id number]   SUBJECTIVE: Stable today.  No chest pain, no dyspnea.  UOP 975 cc yesterday.  Creatinine continues to decrease.  Cardiolite 4/20 with inferolateral infarct + peri-infarct ischemia.    Scheduled Meds: . aspirin  81 mg Oral Daily  . atorvastatin  40 mg Oral q1800  . chlorhexidine  15 mL Mouth Rinse BID  . darbepoetin (ARANESP) injection - NON-DIALYSIS  40 mcg Subcutaneous Q Tue-1800  . DULoxetine  60 mg Oral BID  . feeding supplement (PRO-STAT SUGAR FREE 64)  30 mL Oral BID  . feeding supplement (RESOURCE BREEZE)  1 Container Oral TID BM  . heparin subcutaneous  5,000 Units Subcutaneous 3 times per day  . insulin aspart  0-9 Units Subcutaneous TID WC  . insulin glargine  16 Units Subcutaneous QHS  . lidocaine  1 patch Transdermal Q24H  . metoprolol tartrate  12.5 mg Oral BID  . multivitamin  1 tablet Oral QHS  . pantoprazole  40 mg Oral Q12H  . potassium chloride  20 mEq Oral Daily  . predniSONE  10 mg Oral Q breakfast  . rOPINIRole  2 mg Oral QHS  . silver sulfADIAZINE   Topical Daily  . sodium bicarbonate  650 mg Oral BID  . torsemide  60 mg Oral Daily   Continuous Infusions:  PRN Meds:.acetaminophen, albuterol, ondansetron (ZOFRAN) IV, sodium chloride, sodium chloride  Filed Vitals:   11/13/14 0934 11/13/14 1700 11/13/14 2204 11/14/14 0416  BP: 127/62 107/76 132/76 101/56  Pulse: 76 78 73 71  Temp: 98.6 F (37 C) 98.2 F (36.8 C) 98.1 F (36.7 C) 97.7 F (36.5 C)  TempSrc: Oral Oral Oral Oral  Resp: Height:      Weight:   201 lb 11.2 oz (91.491 kg)   SpO2: 96% 98% 100% 100%    Intake/Output Summary (Last 24 hours) at 11/14/14 1039 Last data filed at 11/14/14 0507  Gross per 24 hour  Intake    360 ml  Output    975 ml  Net   -615 ml    LABS: Basic Metabolic Panel:  Recent Labs  40/98/11 0530 11/13/14 0642 11/14/14 0645  NA 141 140 138  K 4.3 4.1 4.1  CL 105 105  102  CO2 GLUCOSE 201* 168* 191*  BUN 73* 62* 57*  CREATININE 3.16* 2.55* 2.42*  CALCIUM 9.2 8.9 8.9  PHOS 4.2 3.5  --    Liver Function Tests:  Recent Labs  11/12/14 0530 11/13/14 0642  ALBUMIN 2.9* 2.8*   No results for input(s): LIPASE, AMYLASE in the last 72 hours. CBC:  Recent Labs  11/12/14 0530 11/13/14 0642  WBC 8.5 8.4  HGB 9.4* 9.1*  HCT 30.5* 29.4*  MCV 84.0 84.7  PLT 285 264   Cardiac Enzymes: No results for input(s): CKTOTAL, CKMB, CKMBINDEX, TROPONINI in the last 72 hours. BNP: Invalid input(s): POCBNP D-Dimer: No results for input(s): DDIMER in the last 72 hours. Hemoglobin A1C: No results for input(s): HGBA1C in the last 72 hours. Fasting Lipid Panel: No results for input(s): CHOL, HDL, LDLCALC, TRIG, CHOLHDL, LDLDIRECT in the last 72 hours. Thyroid Function Tests: No results for input(s): TSH, T4TOTAL, T3FREE, THYROIDAB in the last 72 hours.  Invalid input(s): FREET3 Anemia Panel: No results for input(s): VITAMINB12, FOLATE, FERRITIN, TIBC, IRON, RETICCTPCT in the last 72 hours.   PHYSICAL EXAM General: NAD  Neck: No JVD, no thyromegaly or thyroid nodule.  Lungs: Clear to auscultation bilaterally with normal respiratory effort. CV: Nondisplaced PMI.  Heart regular S1/S2, no S3/S4, no murmur.  No peripheral edema.   Abdomen: Soft, nontender, no hepatosplenomegaly, no distention.  Neurologic: Alert and oriented x 3.  Psych: Normal affect. Extremities: No clubbing or cyanosis.   TELEMETRY: Reviewed telemetry pt in NSR  ASSESSMENT AND PLAN: 69 yo with history of CKD, diastolic CHF, and CAD was admitted with hypotension/suspected septic shock, AKI, and respiratory failure. Initially intubated and on pressors.  Had CVVH then HD, now off RRT.  TnI to 0.08.  1. AKI on CKD: In setting of septic shock.  Creatinine improving and she is having reasonable UOP without RRT.  Creatinine coming down, UOP improving.  2. Shock: Suspected septic  shock initially, now resolved.  Midodrine stopped yesterday.  Continue compression stockings. 3. CAD: Mild troponin elevation at admission in setting of hypotension/AKI.  Suspect demand ischemia.  No chest pain.  Cardiolite with inferolateral scar + peri-infarct ischemia.  She has had a lateral defect on prior studies.  Not sure that there has been a significant change.  Would not pursue cath at this time with recovering AKI and no chest pain.   - Continue ASA and statin.  - Tolerating low dose metoprolol.   4. Chronic primarily diastolic CHF: EF 16%50% by echo, 10%42% by Cardiolite.  On exam today, not significantly volume overloaded. She has been on torsemide 60 mg daily chronically at home, will restart today and follow renal function.  5. AKI on CKD: Suspect ATN in setting of hypotension at admission.  Initially required RRT, now improving.  Creatinine coming down, UOP improving. 6. OHS/OSA: Cannot tolerate Bipap or CPAP. Needs oxygen at night.  7. Will need CIR, from my standpoint could go now.  Will just need creatinine followed.   Krista Wise 11/14/2014 10:39 AM

## 2014-11-14 NOTE — Care Management Note (Signed)
CARE MANAGEMENT NOTE 11/14/2014  Patient:  Krista Wise, Krista Wise   Account Number:  [id number]  Date Initiated:  10/20/2014  Documentation initiated by:  MAYO,HENRIETTA  Subjective/Objective Assessment:   dx resp failure/hypotension; lives with spouse, has home O2    PCP  Janeann Merl     Action/Plan:   10/31/14 Met with pt ,plan is for possible CIR. CIR following.  11/14/2014 Plan to d/c to CIR today. IM given ,pt anxious to d/c to CIR.   Anticipated DC Date:  11/14/2014   Anticipated DC Plan:  IP REHAB FACILITY      DC Planning Services  CM consult      Choice offered to / List presented to:             Status of service:  Completed, signed off Medicare Important Message given?  YES (If response is "NO", the following Medicare IM given date fields will be blank) Date Medicare IM given:  10/23/2014 Medicare IM given by:  MAYO,HENRIETTA Date Additional Medicare IM given:  11/05/2014 Additional Medicare IM given by:  JULIE AMERSON  Discharge Disposition:    Per UR Regulation:  Reviewed for med. necessity/level of care/duration of stay  If discussed at [place] of Stay Meetings, dates discussed:    Comments:   11/14/2014 IM given ,plan for d/c to CIR today. CRoyal RN MPH, case manager, [phone]  11/07/2014 IM given, plan to continue to await renal recovery.  CRoyal RN MPH, case manager, [phone]  10/31/2014 CIR following for possible adm. Vein Mapping today.  CRoyal RN MPH, case manager, [phone]  10/27/14 [place] RN MSN BSN CCM Transferred to [place].  PT recommending CIR if pt progresses. Pt has been basically bed to chair for several years, spouse is primary caregiver, supplements with paid caregivers.
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
## 2014-11-14 NOTE — Progress Notes (Signed)
Patient ID: Krista KalataMary O [name], female   DOB: 1946-04-24, 69 y.o.   MRN: [id number] Patient arrived from 6E via bed with RN, family and belongings. Oriented to room, fall prevention plan, rehab safety plan, rehab schedule, call bell, rehab process, and [facility] Notebook with verbal understanding. Patient resting comfortably in bed with family at bedside and no c/o of pain at this time.
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
## 2014-11-14 NOTE — Progress Notes (Signed)
Discussed with Dr Gwenlyn PerkingMadera. We will plan to admit pt to inpt rehab today. I will make the arrangements [phone]

## 2014-11-15 ENCOUNTER — Inpatient Hospital Stay (HOSPITAL_COMMUNITY): Payer: Medicare Other | Admitting: Physical Therapy

## 2014-11-15 ENCOUNTER — Inpatient Hospital Stay (HOSPITAL_COMMUNITY): Payer: Medicare Other | Admitting: Occupational Therapy

## 2014-11-15 DIAGNOSIS — E1121 Type 2 diabetes mellitus with diabetic nephropathy: Secondary | ICD-10-CM

## 2014-11-15 LAB — BASIC METABOLIC PANEL
Anion gap: 11 (ref 5–15)
BUN: 63 mg/dL — AB (ref 6–23)
CHLORIDE: 100 mmol/L (ref 96–112)
CO2: 26 mmol/L (ref 19–32)
Calcium: 8.5 mg/dL (ref 8.4–10.5)
Creatinine, Ser: 2.52 mg/dL — ABNORMAL HIGH (ref 0.50–1.10)
GFR calc Af Amer: 22 mL/min — ABNORMAL LOW (ref >90)
GFR calc non Af Amer: 19 mL/min — ABNORMAL LOW (ref >90)
Glucose, Bld: 176 mg/dL — ABNORMAL HIGH (ref 70–99)
Potassium: 4.3 mmol/L (ref 3.5–5.1)
Sodium: 137 mmol/L (ref 135–145)

## 2014-11-15 LAB — GLUCOSE, CAPILLARY
GLUCOSE-CAPILLARY: 181 mg/dL — AB (ref 70–99)
Glucose-Capillary: 275 mg/dL — ABNORMAL HIGH (ref 70–99)
Glucose-Capillary: 211 mg/dL — ABNORMAL HIGH (ref 70–99)
Glucose-Capillary: 295 mg/dL — ABNORMAL HIGH (ref 70–99)
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
## 2014-11-15 NOTE — Progress Notes (Signed)
RT unavailable to place pt on Bipap. Had refused to wear it prior night.
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
## 2014-11-16 ENCOUNTER — Inpatient Hospital Stay (HOSPITAL_COMMUNITY): Payer: Medicare Other

## 2014-11-16 DIAGNOSIS — R040 Epistaxis: Secondary | ICD-10-CM

## 2014-11-16 DIAGNOSIS — E1142 Type 2 diabetes mellitus with diabetic polyneuropathy: Secondary | ICD-10-CM

## 2014-11-16 LAB — GLUCOSE, CAPILLARY
GLUCOSE-CAPILLARY: 335 mg/dL — AB (ref 70–99)
GLUCOSE-CAPILLARY: 186 mg/dL — AB (ref 70–99)
Glucose-Capillary: 211 mg/dL — ABNORMAL HIGH (ref 70–99)
Glucose-Capillary: 280 mg/dL — ABNORMAL HIGH (ref 70–99)
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
## 2014-11-16 NOTE — Progress Notes (Signed)
Physical Therapy Session Note  Patient Details  Name: Krista Wise MRN: [id number] Date of Birth: 12-Dec-1945  Today's Date: 11/16/2014 PT Individual Time: 1030-1100 PT Individual Time Calculation (min): 30 min   Short Term Goals: Week 1:  PT Short Term Goal 1 (Week 1): Pt will demonstrate sit to supine transfer req min A.  PT Short Term Goal 2 (Week 1): Pt will demonstrate supine to sit transfer req SBA.  PT Short Term Goal 3 (Week 1): Pt will ambulate 7050' with RW req min A.  PT Short Term Goal 4 (Week 1): Pt will self propel manual w/c x 150' with SBA PT Short Term Goal 5 (Week 1): Pt will initiate ramp training with PT in order to enter/exit home.   Skilled Therapeutic Interventions/Progress Updates:    Pt received seated in w/c, agreeable to participate in therapy. Pt noted to be incontinent of urine. Pt transferred w/c>bed w/ RW and stand pivot w/ MinA. Pt w/ multiple sit<>stands from bed for hygiene and donning underwear and pants, to sit>stand from bed w/ cueing for hand placement. Remainder of session focused on stand pivot transfers w/ RW. Pt cued to use both hands to push up from arm rests of wheelchair, to take smaller pivots to stay inside RW, to keep RW close to her, and to reach back for surface before going stand>sit. Pt required min physical assist for blocked practice transfers, mod cueing. Pt benefited from seated rest break between each transfer due to decreased endurance. Session ended in pt's room, where pt was left seated in w/c w/ all needs within reach.    Therapy Documentation Precautions:  Precautions Precautions: Fall Precaution Comments: HD catheter has been removed from R chest Restrictions Weight Bearing Restrictions: No Pain: Pain Assessment Pain Assessment: No/denies pain  See FIM for current functional status  Therapy/Group: Individual Therapy  Hosie SpangleGodfrey, Annitta Fifield  Hosie SpangleJess Glynis Hunsucker, PT, DPT 11/16/2014, 7:41 AM

## 2014-11-16 NOTE — Progress Notes (Signed)
Patient continues to refuse BIPAP at night.

## 2014-11-16 NOTE — Progress Notes (Signed)
Patient refuses CPAP at this time.  States that she does not wear CPAP at home.

## 2014-11-17 ENCOUNTER — Inpatient Hospital Stay (HOSPITAL_COMMUNITY): Payer: Medicare Other | Admitting: Physical Therapy

## 2014-11-17 ENCOUNTER — Inpatient Hospital Stay (HOSPITAL_COMMUNITY): Payer: Medicare Other

## 2014-11-17 DIAGNOSIS — N179 Acute kidney failure, unspecified: Secondary | ICD-10-CM

## 2014-11-17 DIAGNOSIS — E1165 Type 2 diabetes mellitus with hyperglycemia: Secondary | ICD-10-CM

## 2014-11-17 DIAGNOSIS — N189 Chronic kidney disease, unspecified: Secondary | ICD-10-CM

## 2014-11-17 DIAGNOSIS — G7281 Critical illness myopathy: Principal | ICD-10-CM

## 2014-11-17 LAB — COMPREHENSIVE METABOLIC PANEL
ALK PHOS: 72 U/L (ref 39–117)
ALT: 7 U/L (ref 0–35)
AST: 16 U/L (ref 0–37)
Albumin: 2.7 g/dL — ABNORMAL LOW (ref 3.5–5.2)
Anion gap: 13 (ref 5–15)
BUN: 67 mg/dL — ABNORMAL HIGH (ref 6–23)
CO2: 23 mmol/L (ref 19–32)
Calcium: 8.4 mg/dL (ref 8.4–10.5)
Chloride: 95 mmol/L — ABNORMAL LOW (ref 96–112)
Creatinine, Ser: 2.51 mg/dL — ABNORMAL HIGH (ref 0.50–1.10)
GFR calc Af Amer: 22 mL/min — ABNORMAL LOW (ref >90)
GFR calc non Af Amer: 19 mL/min — ABNORMAL LOW (ref >90)
GLUCOSE: 268 mg/dL — AB (ref 70–99)
Potassium: 4.6 mmol/L (ref 3.5–5.1)
SODIUM: 131 mmol/L — AB (ref 135–145)
TOTAL PROTEIN: 6.6 g/dL (ref 6.0–8.3)
Total Bilirubin: 0.6 mg/dL (ref 0.3–1.2)

## 2014-11-17 LAB — CBC WITH DIFFERENTIAL/PLATELET
Basophils Absolute: 0 10*3/uL (ref 0.0–0.1)
Basophils Relative: 0 % (ref 0–1)
EOS ABS: 0.4 10*3/uL (ref 0.0–0.7)
EOS PCT: 4 % (ref 0–5)
HEMATOCRIT: 31.2 % — AB (ref 36.0–46.0)
Hemoglobin: 9.5 g/dL — ABNORMAL LOW (ref 12.0–15.0)
LYMPHS ABS: 1.4 10*3/uL (ref 0.7–4.0)
Lymphocytes Relative: 14 % (ref 12–46)
MCH: 26.2 pg (ref 26.0–34.0)
MCHC: 30.4 g/dL (ref 30.0–36.0)
MCV: 86.2 fL (ref 78.0–100.0)
MONOS PCT: 5 % (ref 3–12)
Monocytes Absolute: 0.5 10*3/uL (ref 0.1–1.0)
Neutro Abs: 7.2 10*3/uL (ref 1.7–7.7)
Neutrophils Relative %: 77 % (ref 43–77)
PLATELETS: 238 10*3/uL (ref 150–400)
RBC: 3.62 MIL/uL — ABNORMAL LOW (ref 3.87–5.11)
RDW: 20.7 % — AB (ref 11.5–15.5)
WBC: 9.5 10*3/uL (ref 4.0–10.5)

## 2014-11-17 LAB — GLUCOSE, CAPILLARY
GLUCOSE-CAPILLARY: 186 mg/dL — AB (ref 70–99)
GLUCOSE-CAPILLARY: 254 mg/dL — AB (ref 70–99)
GLUCOSE-CAPILLARY: 206 mg/dL — AB (ref 70–99)
Glucose-Capillary: 278 mg/dL — ABNORMAL HIGH (ref 70–99)

## 2014-11-17 MED ORDER — INSULIN GLARGINE 100 UNIT/ML ~~LOC~~ SOLN
25.0000 [IU] | Freq: Every day | SUBCUTANEOUS | Status: DC
  Administered 2014-11-17 – 2014-11-18 (×2): 25 [IU] via SUBCUTANEOUS
  Filled 2014-11-17 (×3): qty 0.25

## 2014-11-17 NOTE — Progress Notes (Signed)
Occupational Therapy Note  Patient Details  Name: Krista Wise MRN: [id number] Date of Birth: September 27, 1945  Today's Date: 11/17/2014 OT Individual Time: 1300-1430 OT Individual Time Calculation (min): 90 min   Pt denied pain Individual Therapy  Pt propelled w/c to nurses station before requiring assistance to propel to therapy gym.  Pt initially engaged in functional amb with RW for home mgmt tasks and therapeutic activities.  Focus on endurance to increase independence with BADLs.  Pt required rest breaks approx every 45 seconds-1 minute.  Pt transitioned to dynamic standing tasks with multiple rest breaks.  Pt fatigues after approx 90 seconds of standing activities and requires approx 3 mins rest break between tasks.  O2 remains >90% on RA and HR mid 70s.  Pt becomes SOB with activity.     Lavone NeriLanier, Consuela Widener [facility] 11/17/2014, 2:33 PM

## 2014-11-17 NOTE — Progress Notes (Signed)
Patient information reviewed and entered into eRehab system by Gearald Stonebraker, RN, CRRN, PPS Coordinator.  Information including medical coding and functional independence measure will be reviewed and updated through discharge.     Per nursing patient was given "Data Collection Information Summary for Patients in Inpatient Rehabilitation Facilities with attached "Privacy Act Statement-Health Care Records" upon admission.  
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
## 2014-11-17 NOTE — Progress Notes (Signed)
Occupational Therapy Session Note  Patient Details  Name: Theodoro KalataMary O Sabedra MRN: [id number] Date of Birth: 1946/03/06  Today's Date: 11/17/2014 OT Individual Time: 1030-1130 OT Individual Time Calculation (min): 60 min    Short Term Goals: Week 1:  OT Short Term Goal 1 (Week 1): Patient will perform toilet transfer (ambulating with RW) with supervision OT Short Term Goal 2 (Week 1): Patient will perform LB bathing and dressing with supervision in sit<>stand position OT Short Term Goal 3 (Week 1): Patient will perform at least 2 grooming tasks, standing at sink, with supervision OT Short Term Goal 4 (Week 1): Patient will stand for functional activity with supervision for at least 10 minutes with no seated rest breaks   Skilled Therapeutic Interventions/Progress Updates:    Pt initially engaged in BADLs including bathing and dressing with sit<>stand from w/c at sink.  Pt required more than reasonable amount of time with multiple rest breaks to complete all tasks.  Pt required steady A for sit<>stand at sink but was able to maintain static standing balance with supervision.  Pt propelled w/c to nurses station before requiring assistance to continue to therapy gym.  Pt engaged in BUE therex on [employer]  (2 X 5 min at work level 1) with rest break.  Pt initially propelled w/c towards room but required assistance to continue to room.  Pt remained in w/c with all needs within reach.  Focus on activity tolerance, sit<>stand, standing balance, functional amb with RW, and safety awareness.  Therapy Documentation Precautions:  Precautions Precautions: Fall Precaution Comments: HD catheter has been removed from R chest Restrictions Weight Bearing Restrictions: Yes Pain: Pain Assessment Pain Assessment: No/denies pain  See FIM for current functional status  Therapy/Group: Individual Therapy  Rich BraveLanier, Ricquel Foulk Chappell 11/17/2014, 2:03 PM
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
## 2014-11-17 NOTE — IPOC Note (Signed)
Overall Plan of Care [facility](IPOC) Patient Details Name: Krista Wise MRN: [id number] DOB: 27-Mar-1946  Admitting Diagnosis: critical illness myopathy  Hospital Problems: Principal Problem:   Critical illness myopathy Active Problems:   Essential hypertension   Chronic diastolic CHF (congestive heart failure)   Diabetes type 2, uncontrolled     Functional Problem List: Nursing Bladder, Bowel, Edema, Endurance, Medication Management, Nutrition, Pain, Perception, Safety, Skin Integrity  PT Balance, Edema, Endurance, Motor, Safety, Sensory, Skin Integrity  OT Balance, Endurance, Safety, Skin Integrity, Sensory  SLP    TR         Basic ADL's: OT Grooming, Bathing, Dressing, Toileting     Advanced  ADL's: OT Simple Meal Preparation     Transfers: PT Bed Mobility, Bed to Chair, Car, Occupational psychologisturniture  OT Toilet, Research scientist (life sciences)Tub/Shower     Locomotion: PT Ambulation, Psychologist, prison and probation servicesWheelchair Mobility, Stairs     Additional Impairments: OT  (strengthening > BUEs)  SLP        TR      Anticipated Outcomes Item Anticipated Outcome  Self Feeding independent  Swallowing      Basic self-care  mod I  Toileting  mod I   Bathroom Transfers mod I   Bowel/Bladder  min assist  Transfers  mod I  Locomotion  supervision ambulation household distances  Communication     Cognition     Pain  <2 on a 0-10 scale  Safety/Judgment  min assist   Therapy Plan: PT Intensity: Minimum of 1-2 x/day ,45 to 90 minutes PT Frequency: 5 out of 7 days PT Duration Estimated Length of Stay: 14-17 days OT Intensity: Minimum of 1-2 x/day, 45 to 90 minutes OT Frequency: 5 out of 7 days OT Duration/Estimated Length of Stay: 10-14 days         Team Interventions: Nursing Interventions Patient/Family Education, Bladder Management, Bowel Management, Pain Management, Medication Management, Skin Care/Wound Management, Discharge Planning, Psychosocial Support  PT interventions Ambulation/gait training, Disease  management/prevention, Pain management, Stair training, Warden/rangerBalance/vestibular training, DME/adaptive equipment instruction, Patient/family education, Therapeutic Activities, Wheelchair propulsion/positioning, Cognitive remediation/compensation, Psychosocial support, Therapeutic Exercise, Community reintegration, Functional mobility training, Skin care/wound management, UE/LE Strength taining/ROM, Discharge planning, Neuromuscular re-education, Splinting/orthotics, UE/LE Coordination activities  OT Interventions Warden/rangerBalance/vestibular training, Community reintegration, Discharge planning, Functional mobility training, Pain management, Patient/family education, Psychosocial support, Therapeutic Activities, Therapeutic Exercise, UE/LE Strength taining/ROM, UE/LE Coordination activities, Skin care/wound managment, Self Care/advanced ADL retraining  SLP Interventions    TR Interventions    SW/CM Interventions Discharge Planning, Psychosocial Support, Patient/Family Education    Team Discharge Planning: Destination: PT-Home ,OT- Home , SLP-  Projected Follow-up: PT-Home health PT, 24 hour supervision/assistance, OT-   , SLP-  Projected Equipment Needs: PT-To be determined, OT- None recommended by OT, SLP-  Equipment Details: PT-Pt already owns a scooter, RW, 4WW, OT-  Patient/family involved in discharge planning: PT- Patient,  OT-Patient, SLP-   MD ELOS: 13-15 days Medical Rehab Prognosis:  Excellent Assessment: The patient has been admitted for CIR therapies with the diagnosis of critical illness myopathy. The team will be addressing functional mobility, strength, stamina, balance, safety, adaptive techniques and equipment, self-care, bowel and bladder mgt, patient and caregiver education, activity tolerance, ego support, community reintegration. Goals have been set at mod I for mobility and self-care/ADL's.    Ranelle OysterZachary T. Swartz, MD, FAAPMR      See Team Conference Notes for weekly updates to the plan  of care

## 2014-11-17 NOTE — Progress Notes (Signed)
Physical Therapy Session Note  Patient Details  Name: Krista Wise MRN: [id number] Date of Birth: 03/13/1946  Today's Date: 11/17/2014 PT Individual Time: 0800-0900 PT Individual Time Calculation (min): 60 min   Short Term Goals: Week 1:  PT Short Term Goal 1 (Week 1): Pt will demonstrate sit to supine transfer req min A.  PT Short Term Goal 2 (Week 1): Pt will demonstrate supine to sit transfer req SBA.  PT Short Term Goal 3 (Week 1): Pt will ambulate 3550' with RW req min A.  PT Short Term Goal 4 (Week 1): Pt will self propel manual w/c x 150' with SBA PT Short Term Goal 5 (Week 1): Pt will initiate ramp training with PT in order to enter/exit home.   Skilled Therapeutic Interventions/Progress Updates:   Session focused on transfer training, ambulation, generalized strengthening, safety, and activity tolerance. Patient received sitting edge of bed and donned socks with increased time and supervision. Patient brushed teeth at wheelchair level and requested to use bathroom. Patient ambulated 2 x 5 ft using RW to toilet with min A and max verbal cues for safety with RW to keep close to body. Patient noted to be incontinent of urine but unaware, able to successfully finish voiding in toilet. Patient performed 2/3 toileting tasks with steady assist, assist needed for pulling up pants. Patient propelled wheelchair room > therapy gym using BUE with supervision, frequent rest breaks as needed due to SOB. Patient performed stand pivot transfers using RW with min A and max vc's for safe hand placement. Gait using RW 2 x 20 ft with min guard. Stair training on 6" steps using 2 rails, up/down 2 stairs x 1 and up/down 3 stairs x 1 with min A, step-to pattern ascending forwards and descending backwards with seated rest between. Patient required frequent rest breaks throughout session. Patient propelled back to room and left sitting in wheelchair with all needs within reach.   Therapy  Documentation Precautions:  Precautions Precautions: Fall Precaution Comments: HD catheter has been removed from R chest Restrictions Weight Bearing Restrictions: Yes Vital Signs: Therapy Vitals Temp: 98.6 F (37 C) Temp Source: Oral Pulse Rate: 84 Resp: 18 BP: (!) 106/50 mmHg Patient Position (if appropriate): Sitting (after ambulation) Oxygen Therapy SpO2: 98 % O2 Device: Not Delivered Pain: Pain Assessment Pain Assessment: No/denies pain  See FIM for current functional status  Therapy/Group: Individual Therapy  Kerney ElbeVarner, Minnah Llamas A 11/17/2014, 8:55 AM

## 2014-11-18 ENCOUNTER — Inpatient Hospital Stay (HOSPITAL_COMMUNITY): Payer: Medicare Other | Admitting: Physical Therapy

## 2014-11-18 ENCOUNTER — Inpatient Hospital Stay (HOSPITAL_COMMUNITY): Payer: Medicare Other

## 2014-11-18 DIAGNOSIS — I10 Essential (primary) hypertension: Secondary | ICD-10-CM

## 2014-11-18 DIAGNOSIS — I5032 Chronic diastolic (congestive) heart failure: Secondary | ICD-10-CM

## 2014-11-18 LAB — GLUCOSE, CAPILLARY
Glucose-Capillary: 199 mg/dL — ABNORMAL HIGH (ref 70–99)
Glucose-Capillary: 281 mg/dL — ABNORMAL HIGH (ref 70–99)
Glucose-Capillary: 185 mg/dL — ABNORMAL HIGH (ref 70–99)
Glucose-Capillary: 345 mg/dL — ABNORMAL HIGH (ref 70–99)

## 2014-11-18 NOTE — Progress Notes (Signed)
Physical Therapy Session Note  Patient Details  Name: Krista Wise MRN: [id number] Date of Birth: [birth year]  Today's Date: 11/18/2014 PT Individual Time: 1000-1100 PT Individual Time Calculation (min): 60 min   Short Term Goals: Week 1:  PT Short Term Goal 1 (Week 1): Pt will demonstrate sit to supine transfer req min A.  PT Short Term Goal 2 (Week 1): Pt will demonstrate supine to sit transfer req SBA.  PT Short Term Goal 3 (Week 1): Pt will ambulate 6850' with RW req min A.  PT Short Term Goal 4 (Week 1): Pt will self propel manual w/c x 150' with SBA PT Short Term Goal 5 (Week 1): Pt will initiate ramp training with PT in order to enter/exit home.   Skilled Therapeutic Interventions/Progress Updates:   Session focused on transfers, ambulation, standing tolerance, endurance, and pt/family education. Patient received sitting in wheelchair, reporting need for bathroom. Patient ambulated in room to/from toilet using RW with S and mod vc's for keeping RW closer to body. Patient completed toileting tasks with close supervision and increased time as she fatigued and needed frequent rest breaks with multiple sit <> stands using RW and grab bar for hygiene, pulling up underwear, and pulling up pants due to SOB. Patient propelled wheelchair to gym using BUE with supervision and extra time with rest breaks as needed. Performed TUG using RW with S = 40 sec. Standing tolerance at tall table while playing cards with patient able to stand x 90 sec, x 70 sec, and x 60 sec before needing seated rest break. Gait using RW x 60 ft with S. Patient's husband present at end of session. Discussed discharge planning, impairments and limitations, and goals for rehab as patient with inconsistent report of mobility at baseline and eager to return home. Patient left sitting in wheelchair to propel back to room with husband.   Therapy Documentation Precautions:  Precautions Precautions: Fall Precaution Comments: HD  catheter has been removed from R chest Restrictions Weight Bearing Restrictions: Yes (WBAT to Left foot) Pain: Pain Assessment Pain Assessment: No/denies pain Locomotion : Ambulation Ambulation/Gait Assistance: 5: Supervision   See FIM for current functional status  Therapy/Group: Individual Therapy  Kerney ElbeVarner, Madyson Lukach A 11/18/2014, 12:21 PM

## 2014-11-18 NOTE — Progress Notes (Signed)
Pts BP 99/48. Pt asymptomatic. Krista Inaan Angiulli, PA notified. Ordered to hold metoprolol if systolic BP is less than 100. Rudie MeyerEurillo, [place] Lamons A, RN
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
## 2014-11-18 NOTE — Progress Notes (Signed)
Pt refuseing BIPAP again tonight. Informed pt if she changed her mind to have RN call this RT.

## 2014-11-18 NOTE — Progress Notes (Signed)
Occupational Therapy Note  Patient Details  Name: Krista Wise MRN: [id number] Date of Birth: 09-Feb-1946  Today's Date: 11/18/2014 OT Individual Time: 1300-1330 OT Individual Time Calculation (min): 30 min   Pt denied pain Individual Therapy  Pt sitting EOB with sister and husband present.  Pt engaged in therrex with 1kg ball while sitting unsupported.  Pt engaged in PNF patterns with weighted ball.  Pt required multiple rest breaks throughout session.  Focus on increased endurance to improve independence with BADLs.   Lavone NeriLanier, Mackenzi Krogh [facility] 11/18/2014, 2:02 PM
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
## 2014-11-18 NOTE — Progress Notes (Signed)
Occupational Therapy Session Note  Patient Details  Name: Theodoro KalataMary O Bowlds MRN: [id number] Date of Birth: July 05, 1946  Today's Date: 11/18/2014 OT Individual Time: 0800-0900 OT Individual Time Calculation (min): 60 min    Short Term Goals: Week 1:  OT Short Term Goal 1 (Week 1): Patient will perform toilet transfer (ambulating with RW) with supervision OT Short Term Goal 2 (Week 1): Patient will perform LB bathing and dressing with supervision in sit<>stand position OT Short Term Goal 3 (Week 1): Patient will perform at least 2 grooming tasks, standing at sink, with supervision OT Short Term Goal 4 (Week 1): Patient will stand for functional activity with supervision for at least 10 minutes with no seated rest breaks   Skilled Therapeutic Interventions/Progress Updates:    Pt engaged in BADL with emphasis on endurance, standing balance, sit<>stand, functional transfers, and safety awareness.  Pt required steady A for standing balance during bathing and dressing tasks.  Pt required multiple rest breaks throughout session.  Pt required more than reasonable amount of time to complete all tasks.  Pt performed transfers with steady A.    Therapy Documentation Precautions:  Precautions Precautions: Fall Precaution Comments: HD catheter has been removed from R chest Restrictions Weight Bearing Restrictions: Yes (WBAT )  Pain: Pain Assessment Pain Assessment: No/denies pain  See FIM for current functional status  Therapy/Group: Individual Therapy  Rich BraveLanier, Lyah Millirons Chappell 11/18/2014, 10:05 AM

## 2014-11-19 ENCOUNTER — Inpatient Hospital Stay (HOSPITAL_COMMUNITY): Payer: Medicare Other

## 2014-11-19 ENCOUNTER — Inpatient Hospital Stay (HOSPITAL_COMMUNITY): Payer: Medicare Other | Admitting: Physical Therapy

## 2014-11-19 LAB — GLUCOSE, CAPILLARY
GLUCOSE-CAPILLARY: 232 mg/dL — AB (ref 70–99)
GLUCOSE-CAPILLARY: 176 mg/dL — AB (ref 70–99)
Glucose-Capillary: 258 mg/dL — ABNORMAL HIGH (ref 70–99)
Glucose-Capillary: 143 mg/dL — ABNORMAL HIGH (ref 70–99)

## 2014-11-19 MED ORDER — HYDROCERIN EX CREA
TOPICAL_CREAM | Freq: Three times a day (TID) | CUTANEOUS | Status: DC
  Administered 2014-11-20 – 2014-11-22 (×8): via TOPICAL
  Administered 2014-11-23 (×2): 1 via TOPICAL
  Administered 2014-11-23 – 2014-11-24 (×2): via TOPICAL
  Administered 2014-11-24: 1 via TOPICAL
  Administered 2014-11-24: 11:00:00 via TOPICAL
  Administered 2014-11-25: 1 via TOPICAL
  Filled 2014-11-19: qty 113

## 2014-11-19 MED ORDER — INSULIN GLARGINE 100 UNIT/ML ~~LOC~~ SOLN
30.0000 [IU] | Freq: Every day | SUBCUTANEOUS | Status: DC
  Administered 2014-11-19 – 2014-11-24 (×6): 30 [IU] via SUBCUTANEOUS
  Filled 2014-11-19 (×7): qty 0.3

## 2014-11-19 MED ORDER — GLIMEPIRIDE 1 MG PO TABS
1.0000 mg | ORAL_TABLET | Freq: Every day | ORAL | Status: DC
  Administered 2014-11-19 – 2014-11-23 (×5): 1 mg via ORAL
  Filled 2014-11-19 (×7): qty 1

## 2014-11-19 NOTE — Plan of Care (Signed)
Problem: RH SAFETY Goal: RH STG ADHERE TO SAFETY PRECAUTIONS W/ASSISTANCE/DEVICE STG Adhere to Safety Precautions With supervision.  Outcome: Not Progressing Pt and husband both attempt to get her up on their own even after several reminders to call staff first.
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
## 2014-11-19 NOTE — Progress Notes (Signed)
Physical Therapy Session Note  Patient Details  Name: Krista Wise MRN: [id number] Date of Birth: 09-12-1945  Today's Date: 11/19/2014 PT Individual Time: 0900-1000 PT Individual Time Calculation (min): 60 min   Short Term Goals: Week 1:  PT Short Term Goal 1 (Week 1): Pt will demonstrate sit to supine transfer req min A.  PT Short Term Goal 2 (Week 1): Pt will demonstrate supine to sit transfer req SBA.  PT Short Term Goal 3 (Week 1): Pt will ambulate 2150' with RW req min A.  PT Short Term Goal 4 (Week 1): Pt will self propel manual w/c x 150' with SBA PT Short Term Goal 5 (Week 1): Pt will initiate ramp training with PT in order to enter/exit home.   Skilled Therapeutic Interventions/Progress Updates:   Session focused on functional mobility training, strengthening, activity tolerance, and safety. Patient received sitting in wheelchair and propelled room > therapy gym using BUE with supervision and 5-6 rest breaks. Patient reported minimal dizziness with supine > sit this AM following vestibular eval yesterday PM. Patient ambulated short distance to NuStep using RW and performed NuStep using BUE/BLE at level 4, 2 x 5 min with cues for pacing and 4-5 rest breaks. Stair training up/down four 6" steps using 2 rails with mod A ascending, min A descending and step-to pattern. Gait using RW  2 x 70 ft with supervision. Patient performed sit <> stand from wheelchair using RW with cues for controlling descent. Patient performed seated/standing therex using RW: LAQ x 10 each LE, seated marching x 20, knee flexion x 10 each LE, alt hip flexion x 8 (to fatigue). Patient returned to room and left sitting in wheelchair with all needs within reach.    Therapy Documentation Precautions:  Precautions Precautions: Fall Precaution Comments: HD catheter has been removed from R chest Restrictions Weight Bearing Restrictions: Yes (WBAT ) Pain: Pain Assessment Pain Assessment: No/denies pain   See FIM for  current functional status  Therapy/Group: Individual Therapy  Kerney ElbeVarner, Beau Ramsburg A 11/19/2014, 10:16 AM

## 2014-11-19 NOTE — Progress Notes (Signed)
Occupational Therapy Session Note  Patient Details  Name: Krista KalataMary O Wise MRN: [id number] Date of Birth: Dec 14, 1945  Today's Date: 11/19/2014 OT Individual Time: 0800-0900 OT Individual Time Calculation (min): 60 min    Short Term Goals: Week 1:  OT Short Term Goal 1 (Week 1): Patient will perform toilet transfer (ambulating with RW) with supervision OT Short Term Goal 2 (Week 1): Patient will perform LB bathing and dressing with supervision in sit<>stand position OT Short Term Goal 3 (Week 1): Patient will perform at least 2 grooming tasks, standing at sink, with supervision OT Short Term Goal 4 (Week 1): Patient will stand for functional activity with supervision for at least 10 minutes with no seated rest breaks   Skilled Therapeutic Interventions/Progress Updates:    Pt sitting EOB upon arrival.  Pt declined shower this morning and engaged in BADLs including bathing and dressing with sit<>stand from w/c at sink. Pt required steady A for standing balance when pulling up pants.  Pt continues to require more than reasonable amount of time to complete all tasks with multiple rest breaks to complete all tasks.  Focus on activity tolerance, sit<>stand, standing balance, functional transfers, and safety awareness.  Therapy Documentation Precautions:  Precautions Precautions: Fall Precaution Comments: HD catheter has been removed from R chest Restrictions Weight Bearing Restrictions: Yes (WBAT ) Pain: Pain Assessment Pain Assessment: No/denies pain  See FIM for current functional status  Therapy/Group: Individual Therapy  Rich BraveLanier, Krista Wise 11/19/2014, 9:02 AM

## 2014-11-19 NOTE — Progress Notes (Signed)
Occupational Therapy Note  Patient Details  Name: Krista KalataMary O Wise MRN: [id number] Date of Birth: 08/27/1945  Today's Date: 11/19/2014 OT Individual Time: 1300-1330 OT Individual Time Calculation (min): 30 min   Pt denied pain Individual Therapy  Pt engaged in dynamic standing tasks.  Pt initially engaged in folding towels using BUE.  Pt unable to maintain balance without assistance when engaging BUE for task, requiring mod A.  Pt exhibits posterior lean when standing without UE support.  Pt transitioned to standing activity with LUE support on RW while performing task with RUE.  Pt transitioned to tossing horseshoes and stake.  Pt completed later tasks with close supervision.  Focus on dynamic standing balance and activity tolerance.  Pt required multiple rest breaks during therapy session.    Lavone NeriLanier, Gali Spinney [facility] 11/19/2014, 2:50 PM
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
## 2014-11-19 NOTE — Progress Notes (Signed)
Social Work Patient ID: Krista Wise, female   DOB: July 08, 1946, 69 y.o.   MRN: [id number]  Met with pt and husband to review team conference.  Both aware and agreeable to targeted d/c date of 5/3 with mod i / supervision goals.  Deny any concerns at this time.  Will follow for d/c planning needs.  [name], LCSW

## 2014-11-19 NOTE — Progress Notes (Signed)
Occupational Therapy Note  Patient Details  Name: Theodoro KalataMary O Ceja MRN: [id number] Date of Birth: 1946-01-03  Today's Date: 11/19/2014 OT Individual Time: 1100-1200 OT Individual Time Calculation (min): 60 min   Pt denied pain Individual therapy  Pt resting in w/c with RN attending to foot and husband present. Husband educated on toilet transfers and return demonstrated amb with RW to bathroom and also stand pivot transfers from w/c. Husband checked off on toilet transfers and staff notified.  Pt propelled to nurses station and then transitioned to therapy gym.  Pt engaged in BUE therex on [employer]-6 mins X 2 on work load 2 with rest break.  Pt transitioned to standing tasks (folding towels) but unable to complete secondary to increased fatigue.  Pt returned to room and remained in w/c with husband present.  Focus on family education, sit<>stand, standing balance, functional transfers, functional amb with RW, and safety awareness.   Lavone NeriLanier, Salah Nakamura [facility] 11/19/2014, 12:00 PM

## 2014-11-20 ENCOUNTER — Inpatient Hospital Stay (HOSPITAL_COMMUNITY): Payer: Medicare Other | Admitting: Physical Therapy

## 2014-11-20 ENCOUNTER — Inpatient Hospital Stay (HOSPITAL_COMMUNITY): Payer: Medicare Other

## 2014-11-20 LAB — GLUCOSE, CAPILLARY
GLUCOSE-CAPILLARY: 187 mg/dL — AB (ref 70–99)
Glucose-Capillary: 95 mg/dL (ref 70–99)
Glucose-Capillary: 225 mg/dL — ABNORMAL HIGH (ref 70–99)
Glucose-Capillary: 316 mg/dL — ABNORMAL HIGH (ref 70–99)

## 2014-11-20 MED ORDER — SALINE SPRAY 0.65 % NA SOLN
2.0000 | Freq: Four times a day (QID) | NASAL | Status: DC
  Administered 2014-11-20 – 2014-11-24 (×14): 2 via NASAL
  Filled 2014-11-20: qty 44
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
## 2014-11-20 NOTE — Progress Notes (Signed)
Physical Therapy Session Note  Patient Details  Name: Krista KalataMary O Gesell MRN: [id number] Date of Birth: [birth year]  Today's Date: 11/20/2014 PT Individual Time: 1500-1525 PT Individual Time Calculation (min): 25 min   Short Term Goals: Week 1:  PT Short Term Goal 1 (Week 1): Pt will demonstrate sit to supine transfer req min A.  PT Short Term Goal 2 (Week 1): Pt will demonstrate supine to sit transfer req SBA.  PT Short Term Goal 3 (Week 1): Pt will ambulate 2350' with RW req min A.  PT Short Term Goal 4 (Week 1): Pt will self propel manual w/c x 150' with SBA PT Short Term Goal 5 (Week 1): Pt will initiate ramp training with PT in order to enter/exit home.   Skilled Therapeutic Interventions/Progress Updates:   Session focused on strengthening and activity tolerance. Patient received sitting in wheelchair, reporting increased fatigue and required frequent rest breaks throughout session. Patient propelled wheelchair using BUE room > gym with multiple rest breaks (5+) and supervision. Patient negotiated up 3/down 2 stairs using 2 rails with min A overall, with jolting step down on last step resulting in increased back pain and patient requesting to sit. Patient ambulated back to wheelchair using RW with supervision and returned to room. Patietn transferred to bed with supervision using RW and transferred sit > supine with mod A to lift BLE on bed. Patient left sidelying in bed with needs within reach, NT present.    Therapy Documentation Precautions:  Precautions Precautions: Fall Precaution Comments: HD catheter has been removed from R chest Restrictions Weight Bearing Restrictions: No Pain: Pain Assessment Pain Assessment: 0-10 Pain Score: 7  Pain Type: Acute pain Pain Location: Back Pain Orientation: Lower Pain Descriptors / Indicators: Discomfort;Aching Pain Onset: On-going Pain Intervention(s): Ambulation/increased activity  See FIM for current functional status  Therapy/Group:  Individual Therapy  Kerney ElbeVarner, Deannah Rossi A 11/20/2014, 3:48 PM

## 2014-11-20 NOTE — Progress Notes (Signed)
Occupational Therapy Session Note  Patient Details  Name: Krista KalataMary O Wise MRN: [id number] Date of Birth: 08/15/1945  Today's Date: 11/20/2014 OT Individual Time: 0900-1000 OT Individual Time Calculation (min): 60 min    Short Term Goals: Week 1:  OT Short Term Goal 1 (Week 1): Patient will perform toilet transfer (ambulating with RW) with supervision OT Short Term Goal 2 (Week 1): Patient will perform LB bathing and dressing with supervision in sit<>stand position OT Short Term Goal 3 (Week 1): Patient will perform at least 2 grooming tasks, standing at sink, with supervision OT Short Term Goal 4 (Week 1): Patient will stand for functional activity with supervision for at least 10 minutes with no seated rest breaks   Skilled Therapeutic Interventions/Progress Updates:    Pt engaged in BADL retraining with focus on activity tolerance, sit<>stand, and standing balance.  Pt completed bathing at shower level with sit<>stand to bathe buttocks.  Pt stated she was exhausted after shower and required assistance with pulling underwear this morning.  Pt performed stand pivot transfers with supervision.  Pt required multiple rest breaks throughout session and required more than a reasonable amount of time to complete all tasks.    Therapy Documentation Precautions:  Precautions Precautions: Fall Precaution Comments: HD catheter has been removed from R chest Restrictions Weight Bearing Restrictions: Yes (WBAT ) Pain: Pain Assessment Pain Assessment: No/denies pain Pain Score: 0-No pain  See FIM for current functional status  Therapy/Group: Individual Therapy  Rich BraveLanier, Khloi Rawl Chappell 11/20/2014, 10:02 AM

## 2014-11-20 NOTE — Progress Notes (Signed)
Occupational Therapy Note  Patient Details  Name: Krista Wise MRN: [id number] Date of Birth: [birth year]  Today's Date: 11/20/2014 OT Individual Time: 1400-1430 OT Individual Time Calculation (min): 30 min   Pt denied pain Individual Therapy  Pt resting in w/c upon arrival.  Pt stated she needed to use toilet and completed toilet transfer and toileting tasks with supervision.  Pt transitioned to BUE therex with 1kg weighted ball.  Pt required multiple rest breaks.  Exercise ball left in room and pt return demonstrated exercises to complete when in room.  Krista Wise, Krista Wise 11/20/2014, 2:45 PM

## 2014-11-20 NOTE — Progress Notes (Signed)
Physical Therapy Session Note  Patient Details  Name: Krista KalataMary O Marano MRN: [id number] Date of Birth: [birth year]  Today's Date: 11/20/2014 PT Individual Time: 0800-0900 and 11:30 - 12:00 PT Individual Time Calculation (min): 60 min and 30 min  Short Term Goals: Week 1:  PT Short Term Goal 1 (Week 1): Pt will demonstrate sit to supine transfer req min A.  PT Short Term Goal 2 (Week 1): Pt will demonstrate supine to sit transfer req SBA.  PT Short Term Goal 3 (Week 1): Pt will ambulate 9750' with RW req min A.  PT Short Term Goal 4 (Week 1): Pt will self propel manual w/c x 150' with SBA PT Short Term Goal 5 (Week 1): Pt will initiate ramp training with PT in order to enter/exit home.   Skilled Therapeutic Interventions/Progress Updates:  Session 1: Patient sitting in wheelchair finishing breakfast upon entering room. Patient reports no pain or dizziness throughout session. Patient ambulated with RW 83 feet x 2 with supervision. Patient ambulated over and around objects and side stepped through tight area with min steady assist. Patient ambulated up and down ramp and curb with min assist for balance when lifting and lowering walker up/down curb. Patient performed standing LE exercises x 10 reps each with UE support for strength and endurance including: hip flexion, hip abduction, and mini squats. Patient performed LAQ's in sitting x 15 reps each. Patient propelled wheelchair 120 feet with B UE's and 1 rest break on level surface. Patient left in room in wheelchair with all items in reach.  Session 2: Patient sitting in wheelchair upon entering room. Patient requested to use restroom. Patient ambulated into bathroom with RW and min steady assist due to raised tile areas around shower. Patient performed toileting tasks with steady assist. Patient ambulated with steady assist and RW to sink to wash hands. Patient propelled wheelchair 60 feet - PT pushed rest of way due to time. Patient worked on activity  endurance using Nustep for 2 sets at 5 minutes on workload 4. Patient only required 1 rest break in between each 5 minutes. Patient transferred wheelchair <> Nustep seat with supervision using RW. Patient left in room in wheelchair with all items in reach.  Therapy Documentation Precautions:  Precautions Precautions: Fall Precaution Comments: HD catheter has been removed from R chest Restrictions Weight Bearing Restrictions: No  Pain: Pain Assessment Pain Assessment: No/denies pain Pain Score: 0-No pain  Locomotion : Ambulation Ambulation/Gait Assistance: 5: Supervision Wheelchair Mobility Distance: 120   See FIM for current functional status  Therapy/Group: Individual Therapy  Arelia LongestWindsor, Obie Silos M 11/20/2014, 11:52 AM

## 2014-11-20 NOTE — Progress Notes (Addendum)
Nutrition Brief Note  RD contacted via RN, pt has been refusing her supplements (prostat and [employer] Breeze) as pt has been eating well with no other difficulties. Current diet order is carbohydrate modifed, patient is consuming approximately 100% of meals at this time. PO intake has been adequate, thus RD will discontinue supplements. Labs and medications reviewed.   No nutrition interventions warranted at this time. If nutrition issues arise, please consult RD.   Marijean NiemannStephanie La, MS, RD, LDN Pager # [phone] After hours/ weekend pager # [phone]

## 2014-11-21 ENCOUNTER — Inpatient Hospital Stay (HOSPITAL_COMMUNITY): Payer: Medicare Other | Admitting: Occupational Therapy

## 2014-11-21 ENCOUNTER — Inpatient Hospital Stay (HOSPITAL_COMMUNITY): Payer: Medicare Other | Admitting: Physical Therapy

## 2014-11-21 LAB — GLUCOSE, CAPILLARY
GLUCOSE-CAPILLARY: 246 mg/dL — AB (ref 70–99)
GLUCOSE-CAPILLARY: 164 mg/dL — AB (ref 70–99)
Glucose-Capillary: 205 mg/dL — ABNORMAL HIGH (ref 70–99)

## 2014-11-21 MED ORDER — DARBEPOETIN ALFA 40 MCG/0.4ML IJ SOSY
40.0000 ug | PREFILLED_SYRINGE | INTRAMUSCULAR | Status: DC
  Administered 2014-11-21: 40 ug via SUBCUTANEOUS
  Filled 2014-11-21: qty 0.4

## 2014-11-21 NOTE — Progress Notes (Signed)
Occupational Therapy Session Note  Patient Details  Name: Krista Wise MRN: [id number] Date of Birth: Jun 17, 1946  Today's Date: 11/21/2014 OT Individual Time: 1000-1100 OT Individual Time Calculation (min): 60 min    Short Term Goals: Week 1:  OT Short Term Goal 1 (Week 1): Patient will perform toilet transfer (ambulating with RW) with supervision OT Short Term Goal 2 (Week 1): Patient will perform LB bathing and dressing with supervision in sit<>stand position OT Short Term Goal 3 (Week 1): Patient will perform at least 2 grooming tasks, standing at sink, with supervision OT Short Term Goal 4 (Week 1): Patient will stand for functional activity with supervision for at least 10 minutes with no seated rest breaks   Skilled Therapeutic Interventions/Progress Updates:    Pt received in room. Pt stated that she had just finished sponge bathing and dressing at the sink. She stated that she was able to don pants over feet and pull them up in standing without A. Assisted pt to don R TEDS and educated her on how to instruct her husband on how to don them and his body positioning to protect his back/knees.  Pt has ACE wrap on L leg.   Pt propelled self to therapy. Her goal this session: increased endurance and LE strength to enable her to get in and out of bed. Pt sat on therapy mat and worked on sit ><stand 5x, 2 times with close S.  Sitting on mat: leg extension, leg adduction, ankle rolls, hip flexion. Supine: small hip bridges, single leg hip flexion. Pt has back pain so needed small ROM.  Pt had no difficulty lifting legs on and off mat.  Pt very fatigued at end of session, assisted pt with getting back to room by pushing her chair. Pt resting in chair in room with all needs met.   Therapy Documentation Precautions:  Precautions Precautions: Fall Precaution Comments: HD catheter has been removed from R chest Restrictions Weight Bearing Restrictions: No Vital Signs: Therapy Vitals Pulse  Rate: 76 BP: (!) 116/56 mmHg Patient Position (if appropriate): Sitting Oxygen Therapy SpO2: 98 % O2 Device: Not Delivered Pain: Pain Assessment Pain Assessment: No/denies pain ADL:  See FIM for current functional status  Therapy/Group: Individual Therapy  Mabell Esguerra 11/21/2014, 11:52 AM

## 2014-11-21 NOTE — Progress Notes (Signed)
Inpatient Diabetes Program Recommendations  AACE/ADA: New Consensus Statement on Inpatient Glycemic Control (2013)  Target Ranges:  Prepandial:   less than 140 mg/dL      Peak postprandial:   less than 180 mg/dL (1-2 hours)      Critically ill patients:  140 - 180 mg/dL   Results for [name], Gerrie O (MRN [id number]) as of 11/21/2014 13:54  Ref. Range 11/20/2014 11:36 11/20/2014 16:47 11/20/2014 21:13 11/21/2014 06:52 11/21/2014 12:07  Glucose-Capillary Latest Ref Range: 70-99 mg/dL 409187 (H) 811225 (H) 914316 (H) 205 (H) 246 (H)   Diabetes history: DM 2 Outpatient Diabetes medications: Lantus 55 units QHS, Metformin 1,000 mg BID, Novolog 5 units TID Current orders for Inpatient glycemic control: Lantus 30 units QHS, Amaryl 1mg  Daily, Novolog 0-9 units TID  Inpatient Diabetes Program Recommendations Insulin - Basal: Glucose today 205, 246 mg/dl. Please consider increasing basal insulin Lantus 35 units QHS.  Insulin - Correction: Patient's glucose last pm was 316 mg/dl. Please consider Novolog 0-5 units QHS for bedtime coverage.   Thanks,  Christena DeemShannon Zarius Furr RN, MSN, [facility] Inpatient Diabetes Coordinator Team Pager [phone]

## 2014-11-21 NOTE — Progress Notes (Signed)
Physical Therapy Weekly Progress Note  Patient Details  Name: Krista Wise MRN: [id number] Date of Birth: July 23, 1946  Beginning of progress report period: November 13, 2014 End of progress report period: November 21, 2014  Today's Date: 11/21/2014 PT Individual Time: 0800-0900 PT Individual Time Calculation (min): 60 min   Patient has met 4 of 5 short term goals. Patient currently requires supervision-min A for functional mobility except up to min-mod A for sit > supine transfers. Patient demonstrates significantly impaired endurance and requires frequent seated rest breaks with all functional mobility tasks. Patient's husband has been cleared to assist patient in room and is in agreement with 24/7 supervision recommendation due to falls risk as well as previous history of frequent falls.    Patient continues to demonstrate the following deficits: muscle weakness, decreased cardiorespiratory endurance, decreased coordination, decreased memory, decreased standing balance and decreased postural control and therefore will continue to benefit from skilled PT intervention to enhance overall performance with activity tolerance, balance, postural control, ability to compensate for deficits, functional use of  right upper extremity, right lower extremity, left upper extremity and left lower extremity and coordination.  Patient progressing toward long term goals.  Continue plan of care.  PT Short Term Goals Week 1:  PT Short Term Goal 1 (Week 1): Pt will demonstrate sit to supine transfer req min A.  PT Short Term Goal 1 - Progress (Week 1): Not met PT Short Term Goal 2 (Week 1): Pt will demonstrate supine to sit transfer req SBA.  PT Short Term Goal 2 - Progress (Week 1): Met PT Short Term Goal 3 (Week 1): Pt will ambulate 3' with RW req min A.  PT Short Term Goal 3 - Progress (Week 1): Met PT Short Term Goal 4 (Week 1): Pt will self propel manual w/c x 150' with SBA PT Short Term Goal 4 - Progress  (Week 1): Met (with multiple rest breaks) PT Short Term Goal 5 (Week 1): Pt will initiate ramp training with PT in order to enter/exit home.  PT Short Term Goal 5 - Progress (Week 1): Met Week 2:  PT Short Term Goal 1 (Week 2): = LTGs due to anticipated LOS  Skilled Therapeutic Interventions/Progress Updates:   Session focused on functional mobility training, strengthening, and activity tolerance. Patient received sitting edge of bed, finishing breakfast. Patient requested to use bathroom, performed sit > stand from edge of bed after multiple attempts using RW with supervision and ambulated to/from bathroom using RW at supervision level. Patient performed all toileting tasks and toilet transfer using RW with supervision and washed hands at w/c level. Patient propelled wheelchair using BUE x 150 ft with supervision and 3 rest breaks. Gait using RW x 85 ft + 90 ft + 30 ft with supervision. In ADL apartment, practiced ambulation in home environment and furniture transfers to low compliant couch surface using RW with supervision. Patient performed bed mobility training on flat bed in ADL apartment to simulate home environment, patient with greatly increased effort and difficulty with sit > supine but able to achieve position with increased time and S cues for sequencing. Patient transferred supine > sit with improved ease and supervision. On second and third trials, patient able to perform sit > supine with improved efficiency utilizing momentum. Patient returned to room and left sitting in wheelchair with needs within reach.    Therapy Documentation Precautions:  Precautions Precautions: Fall Precaution Comments: HD catheter has been removed from R chest Restrictions Weight Bearing Restrictions:  No Vital Signs: Therapy Vitals Temp: 98.4 F (36.9 C) Temp Source: Oral Pulse Rate: 85 BP: (!) 101/41 mmHg Patient Position (if appropriate): Sitting Oxygen Therapy SpO2: 98 % O2 Device: Not  Delivered Pain: Pain Assessment Pain Assessment: 0-10 Pain Score: 4  Pain Type: Acute pain Pain Location: Back Pain Orientation: Lower Pain Descriptors / Indicators: Aching;Nagging Pain Onset: On-going Pain Intervention(s): Ambulation/increased activity  See FIM for current functional status  Therapy/Group: Individual Therapy  Laretta Alstrom 11/21/2014, 8:59 AM

## 2014-11-21 NOTE — Progress Notes (Signed)
Physical Therapy Session Note  Patient Details  Name: Krista Wise MRN: [id number] Date of Birth: [birth year]  Today's Date: 11/21/2014 PT Individual Time: 1317-1410 PT Individual Time Calculation (min): 53 min   Short Term Goals: Week 1:  PT Short Term Goal 1 (Week 1): Pt will demonstrate sit to supine transfer req min A.  PT Short Term Goal 1 - Progress (Week 1): Not met PT Short Term Goal 2 (Week 1): Pt will demonstrate supine to sit transfer req SBA.  PT Short Term Goal 2 - Progress (Week 1): Met PT Short Term Goal 3 (Week 1): Pt will ambulate 58' with RW req min A.  PT Short Term Goal 3 - Progress (Week 1): Met PT Short Term Goal 4 (Week 1): Pt will self propel manual w/c x 150' with SBA PT Short Term Goal 4 - Progress (Week 1): Met (with multiple rest breaks) PT Short Term Goal 5 (Week 1): Pt will initiate ramp training with PT in order to enter/exit home.  PT Short Term Goal 5 - Progress (Week 1): Met Week 2:  PT Short Term Goal 1 (Week 2): = LTGs due to anticipated LOS  Skilled Therapeutic Interventions/Progress Updates:   Pt received in w/c for vestibular follow up assessment.  Pt continues to report mild vertigo with rolling and supine > sit but reports decreased intensity 2/10 and decreased duration.  Performed repeat R and L Dix-Hallpike with vision occluded; no reports of vertigo in testing positions and no nystagmus noted.  Returned to sitting EOB and educated pt on use of Brandt-Daroff for habituation until 2 days symptom free.  Demonstrated exercise to pt and attempted to have pt return demonstrate but due to LE weakness and back pain pt unable to assume sidelying position from sitting.  Attempted to modify exercise on bed but unable.  Will attempt to teach to caregiver prior to D/C so that caregiver can assist with exercise at home.  Will f/u on Monday.  Pt left in bed resting comfortably with all items within reach; no nausea noted today.     Therapy  Documentation Precautions:  Precautions Precautions: Fall Precaution Comments: HD catheter has been removed from R chest Restrictions Weight Bearing Restrictions: No Vital Signs: Therapy Vitals Temp: 97.8 F (36.6 C) Temp Source: Oral BP: (!) 106/57 mmHg Patient Position (if appropriate): Lying Oxygen Therapy SpO2: 98 % O2 Device: Not Delivered Pain:  No reports of pain  See FIM for current functional status  Therapy/Group: Individual Therapy  Raylene Everts [facility] 11/21/2014, 5:06 PM
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
## 2014-11-21 NOTE — Progress Notes (Signed)
Occupational Therapy Session Note  Patient Details  Name: Krista Wise MRN: [id number] Date of Birth: [birth year]  Today's Date: 11/21/2014 OT Individual Time: 1600-1630 OT Individual Time Calculation (min): 30 min    Short Term Goals: Week 1:  OT Short Term Goal 1 (Week 1): Patient will perform toilet transfer (ambulating with RW) with supervision OT Short Term Goal 2 (Week 1): Patient will perform LB bathing and dressing with supervision in sit<>stand position OT Short Term Goal 3 (Week 1): Patient will perform at least 2 grooming tasks, standing at sink, with supervision OT Short Term Goal 4 (Week 1): Patient will stand for functional activity with supervision for at least 10 minutes with no seated rest breaks   Skilled Therapeutic Interventions/Progress Updates:    Addressed functional mobility, BUE AROM, sit to stand.  Pt ambulated with RW to bathroom with close SBA.  Sat EOB and performed UE AROM using red weight ball.  Pt recalled 3 UE exercises..   Did 3 exercises with 8 reps.  Performed Sit to stand x5.  Pt did 2 sit to stand in 15 seconds using left arm to push up from rail.  Pt. Left on EOB with all needs in reach.   Therapy Documentation Precautions:  Precautions Precautions: Fall Precaution Comments: HD catheter has been removed from R chest Restrictions Weight Bearing Restrictions: No General:   Pain:  none          See FIM for current functional status  Therapy/Group: Individual Therapy  Humberto Sealsdwards, [place] Fosdick J 11/21/2014, 6:24 PM

## 2014-11-22 ENCOUNTER — Inpatient Hospital Stay (HOSPITAL_COMMUNITY): Payer: Medicare Other | Admitting: Occupational Therapy

## 2014-11-22 ENCOUNTER — Inpatient Hospital Stay (HOSPITAL_COMMUNITY): Payer: Medicare Other | Admitting: Physical Therapy

## 2014-11-22 LAB — GLUCOSE, CAPILLARY
GLUCOSE-CAPILLARY: 183 mg/dL — AB (ref 70–99)
GLUCOSE-CAPILLARY: 153 mg/dL — AB (ref 70–99)
GLUCOSE-CAPILLARY: 165 mg/dL — AB (ref 70–99)
Glucose-Capillary: 160 mg/dL — ABNORMAL HIGH (ref 70–99)

## 2014-11-22 NOTE — Progress Notes (Signed)
Occupational Therapy Session Note  Patient Details  Name: Krista KalataMary O Tropea MRN: [id number] Date of Birth: September 30, 1945  Today's Date: 11/22/2014 OT Individual Time:  -    1750-1840  (50 min)       Short Term Goals: Week 1:  OT Short Term Goal 1 (Week 1): Patient will perform toilet transfer (ambulating with RW) with supervision OT Short Term Goal 2 (Week 1): Patient will perform LB bathing and dressing with supervision in sit<>stand position OT Short Term Goal 3 (Week 1): Patient will perform at least 2 grooming tasks, standing at sink, with supervision OT Short Term Goal 4 (Week 1): Patient will stand for functional activity with supervision for at least 10 minutes with no seated rest breaks  :     Skilled Therapeutic Interventions/Progress Updates:    Addressed function mobility, upright sitting posture, wc mobility, UE strengthening. Pt ambulated to bathroom with minimal assist using RW.  Performed peri care and min assist with clothes.  Ambulated to sink and stood to wash hands.  Utilized sit down rest break.  Pt ambulated to nursing station.  Propelled wc to gym with no assistance.  Transferred to mat.  Engaged in card game that pt explained rules and kept up with score.  Performed UE free weight using 2 # for 3 exercises and 4 # bar for chest press x20.  Pt needed 2 minute rest breaks between exercises.  Propelled wc back to room and left with all needs in reach.    Therapy Documentation Precautions:  Precautions Precautions: Fall Precaution Comments: HD catheter has been removed from R chest Restrictions Weight Bearing Restrictions: No   Pain: Pain Assessment Pain Assessment: No/denies pain          See FIM for current functional status  Therapy/Group: Individual Therapy  Humberto Sealsdwards, Laraina Sulton J 11/22/2014, 6:45 PM

## 2014-11-22 NOTE — Progress Notes (Signed)
Physical Therapy Session Note  Patient Details  Name: Krista Wise MRN: [id number] Date of Birth: [birth year]  Today's Date: 11/22/2014 PT Individual Time: 1415-1500 PT Individual Time Calculation (min): 45 min   Short Term Goals: Week 1:  PT Short Term Goal 1 (Week 1): Pt will demonstrate sit to supine transfer req min A.  PT Short Term Goal 1 - Progress (Week 1): Not met PT Short Term Goal 2 (Week 1): Pt will demonstrate supine to sit transfer req SBA.  PT Short Term Goal 2 - Progress (Week 1): Met PT Short Term Goal 3 (Week 1): Pt will ambulate 25' with RW req min A.  PT Short Term Goal 3 - Progress (Week 1): Met PT Short Term Goal 4 (Week 1): Pt will self propel manual w/c x 150' with SBA PT Short Term Goal 4 - Progress (Week 1): Met (with multiple rest breaks) PT Short Term Goal 5 (Week 1): Pt will initiate ramp training with PT in order to enter/exit home.  PT Short Term Goal 5 - Progress (Week 1): Met Week 2:  PT Short Term Goal 1 (Week 2): = LTGs due to anticipated LOS Skilled Therapeutic Interventions/Progress Updates:   Pt most limited in session by fatigue and deconditioning with weight bearing exercises. Pt requires rest breaks to tolerate these activities.  Pt would continue to benefit from individualized skilled PT services to increase functional mobility.  Therapy Documentation Precautions:  Precautions Precautions: Fall Precaution Comments: HD catheter has been removed from R chest Restrictions Weight Bearing Restrictions: No Pain: Pain Assessment Pain Assessment: No/denies pain Mobility:  Transfers SBA with cues for ECC control Locomotion : Ambulation Ambulation/Gait Assistance: 5: Supervision (80'x2)  Other Treatments:  Pt performs static standing 1'x3. Transfers 3x5 in session. Pt performs standing alternating UE flexion, glute resisted isometrics, hip abd/add isometrics, glute sets, LAQs, manually resisted HS isometrics, marching, hip IR and ER AROM,  anterior weight shifts 2x10. Pt educated on rehab plan, safety in mobility, and pacing.  See FIM for current functional status  Therapy/Group: Individual Therapy  Monia Pouch 11/22/2014, 4:46 PM
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
## 2014-11-23 ENCOUNTER — Inpatient Hospital Stay (HOSPITAL_COMMUNITY): Payer: Medicare Other | Admitting: Occupational Therapy

## 2014-11-23 DIAGNOSIS — N3000 Acute cystitis without hematuria: Secondary | ICD-10-CM

## 2014-11-23 LAB — URINALYSIS, ROUTINE W REFLEX MICROSCOPIC
Bilirubin Urine: NEGATIVE
GLUCOSE, UA: NEGATIVE mg/dL
Ketones, ur: NEGATIVE mg/dL
Nitrite: POSITIVE — AB
Protein, ur: 30 mg/dL — AB
SPECIFIC GRAVITY, URINE: 1.012 (ref 1.005–1.030)
UROBILINOGEN UA: 0.2 mg/dL (ref 0.0–1.0)
pH: 5 (ref 5.0–8.0)

## 2014-11-23 LAB — GLUCOSE, CAPILLARY
GLUCOSE-CAPILLARY: 205 mg/dL — AB (ref 70–99)
GLUCOSE-CAPILLARY: 155 mg/dL — AB (ref 70–99)
Glucose-Capillary: 144 mg/dL — ABNORMAL HIGH (ref 70–99)
Glucose-Capillary: 190 mg/dL — ABNORMAL HIGH (ref 70–99)

## 2014-11-23 LAB — URINE MICROSCOPIC-ADD ON

## 2014-11-23 MED ORDER — CEPHALEXIN 250 MG PO CAPS
250.0000 mg | ORAL_CAPSULE | Freq: Three times a day (TID) | ORAL | Status: DC
  Administered 2014-11-23 – 2014-11-25 (×6): 250 mg via ORAL
  Filled 2014-11-23 (×9): qty 1
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
## 2014-11-23 NOTE — Progress Notes (Signed)
Occupational Therapy Session Note  Patient Details  Name: Krista KalataMary O Wise MRN: [id number] Date of Birth: 25-Jan-1946  Today's Date: 11/23/2014 OT Individual Time:  -   1100-1200  (60 min)      Short Term Goals: Week 1:  OT Short Term Goal 1 (Week 1): Patient will perform toilet transfer (ambulating with RW) with supervision OT Short Term Goal 2 (Week 1): Patient will perform LB bathing and dressing with supervision in sit<>stand position OT Short Term Goal 3 (Week 1): Patient will perform at least 2 grooming tasks, standing at sink, with supervision OT Short Term Goal 4 (Week 1): Patient will stand for functional activity with supervision for at least 10 minutes with no seated rest breaks       Skilled Therapeutic Interventions/Progress Updates:    Addressed function mobility,  wc mobility, UE strengthening. Dynamic sitting balance, wc safety.  Pt. Utilized wc to retrieve clothes for dressing.  Propelled wc to shower area.  Ambulated using grab bars to shower bench.  Provided reacher for pt to use for LB bathing and picking up items as needed however did not use during session. Pt stood to wash peri area using one arm holding to grab bar.  Pt. Ambulated from shower bench to sink to finish ADL.  Dressed in sitting and standing to pull up pants.  Left pt sitting in wc with all needs in reach.    Therapy Documentation Precautions:  Precautions Precautions: Fall Precaution Comments: HD catheter has been removed from R chest Restrictions Weight Bearing Restrictions: No     Pain: Pain Assessment Pain Assessment: No/denies pain       See FIM for current functional status  Therapy/Group: Individual Therapy  Humberto Sealsdwards, Emrick Hensch J 11/23/2014, 11:31 AM

## 2014-11-24 ENCOUNTER — Inpatient Hospital Stay (HOSPITAL_COMMUNITY): Payer: Medicare Other | Admitting: Physical Therapy

## 2014-11-24 ENCOUNTER — Inpatient Hospital Stay (HOSPITAL_COMMUNITY): Payer: Medicare Other

## 2014-11-24 ENCOUNTER — Inpatient Hospital Stay (HOSPITAL_COMMUNITY): Payer: Medicare Other | Admitting: Occupational Therapy

## 2014-11-24 LAB — GLUCOSE, CAPILLARY
GLUCOSE-CAPILLARY: 148 mg/dL — AB (ref 70–99)
Glucose-Capillary: 217 mg/dL — ABNORMAL HIGH (ref 70–99)
Glucose-Capillary: 127 mg/dL — ABNORMAL HIGH (ref 70–99)
Glucose-Capillary: 227 mg/dL — ABNORMAL HIGH (ref 70–99)

## 2014-11-24 MED ORDER — GLIMEPIRIDE 2 MG PO TABS
2.0000 mg | ORAL_TABLET | Freq: Every day | ORAL | Status: DC
  Administered 2014-11-25: 2 mg via ORAL
  Filled 2014-11-24 (×2): qty 1

## 2014-11-24 NOTE — Progress Notes (Signed)
Occupational Therapy Note  Patient Details  Name: Theodoro KalataMary O Cargo MRN: [id number] Date of Birth: Sep 14, 1945  Today's Date: 11/24/2014 OT Individual Time: 1300-1400 OT Individual Time Calculation (min): 60 min   Pt c/o 3/10 pain in lower back; RN notified Individual Therapy  Pt initially practiced shower stall transfers X 3, stepping over 4" ledge to simulate home environment.  Pt completed task with supervision.  Pt has grab bars in shower at home and a build in seat that she has been using.  Pt practiced toilet transfers and engaged in bed mobility tasks at mod I.  Pt transitioned to BUE therex on [employer] (random routine at work load 3-2 X 7 mins with rest breaks X 4.  Pt propelled back to room and remained in w/c.  Focus on functional transfers, BUE strengthening, increased endurance, sit<>stand, functional amb with RW, continued discharge planning, energy conservation strategies, and safety awareness.   Lavone NeriLanier, Rochelle Nephew [facility] HospitalChappell 11/24/2014, 2:09 PM

## 2014-11-24 NOTE — Progress Notes (Signed)
Occupational Therapy Session Note  Patient Details  Name: Krista Wise MRN: [id number] Date of Birth: Mar 25, 1946  Today's Date: 11/24/2014 OT Individual Time: 1130-1200 OT Individual Time Calculation (min): 30 min    Short Term Goals: Week 1:  OT Short Term Goal 1 (Week 1): Patient will perform toilet transfer (ambulating with RW) with supervision OT Short Term Goal 2 (Week 1): Patient will perform LB bathing and dressing with supervision in sit<>stand position OT Short Term Goal 3 (Week 1): Patient will perform at least 2 grooming tasks, standing at sink, with supervision OT Short Term Goal 4 (Week 1): Patient will stand for functional activity with supervision for at least 10 minutes with no seated rest breaks   Skilled Therapeutic Interventions/Progress Updates:    Pt seen for OT session focusing on home mobility and functional activity tolerance. Pt in w/c upon arrival, agreeable to tx. Pt self propelled w/c ~25 yards before requiring rest break. Pt then taken to ADL apartment with total A for time and energy conservation. In ADL apartment, pt stood from w/c with increased time and VCs for technique. Pt stood at kitchen counter to remove and replace items in Psychologist, prison and probation serviceskitchen cabinet and refridgerator.  Pt tolerated x 2 standing trials of ~1-2 minutes each of standing functional task requiring seated rest break between each trial. Pt self propelled w/c back to room at end of session, requiring frequent rest breaks, however, demonstrated ability to regain energy quickly. Pt returned to room at end of session, left sitting up in w/c and RN present.   Extensive pt educated provided regarding energy conservation, community mobility, importance of taking rest breaks, importance of participation/independence with ADLs, 3-1 hospital stay to recovery ratio, and d/c planning.   Therapy Documentation Precautions:  Precautions Precautions: Fall Precaution Comments: HD catheter has been removed from R  chest Restrictions Weight Bearing Restrictions: No Pain: Pain Assessment Pain Assessment: No/denies pain  See FIM for current functional status  Therapy/Group: Individual Therapy  Wise, Krista Fuhrmann C 11/24/2014, 12:01 PM

## 2014-11-24 NOTE — Plan of Care (Signed)
Problem: RH Wheelchair Mobility Goal: LTG Patient will propel w/c in community environment (PT) LTG: Patient will propel wheelchair in community environment, # of feet with assist (PT)  Outcome: Not Applicable Date Met:  90/38/33 Goal discharged due to patient using power scooter at baseline.

## 2014-11-24 NOTE — Progress Notes (Signed)
Physical Therapy Discharge Summary  Patient Details  Name: Krista Wise MRN: [id number] Date of Birth: 03-18-1946  Today's Date: 11/24/2014 PT Individual Time: 7588-3254 PT Individual Time Calculation (min): 60 min   Patient has met 9 of 9 long term goals due to improved activity tolerance, improved balance, improved postural control, increased strength, increased range of motion, decreased pain, ability to compensate for deficits, functional use of  right upper extremity, right lower extremity, left upper extremity and left lower extremity and improved coordination.  Patient to discharge at an ambulatory household level Supervision. Patient's care partner is independent to provide the necessary physical assistance at discharge.  Reasons goals not met: NA  Recommendation:  Patient will benefit from ongoing skilled PT services in home health setting to continue to advance safe functional mobility, address ongoing impairments in muscle weakness, decreased cardiorespiratory endurance, low back pain, decreased coordination, decreased memory, decreased standing balance, decreased balance strategies, and decreased postural control and minimize fall risk.  Equipment: No equipment provided-patient has needed DME  Reasons for discharge: treatment goals met and discharge from hospital  Patient/family agrees with progress made and goals achieved: Yes  Skilled Therapeutic Intervention Patient demonstrates increased fall risk as noted by score of 18/56 on Berg Balance Scale, improved from 7/56 on eval. Patient was unable to complete second half of test even after prolonged sidelying rest break due to increased back pain. 10 MWT using RW = 0.53 m/s. Patient instructed in supine/seated/standing therex for HEP to address strengthening and falls prevention and provided with handouts. See below for further discharge details. Patient left sitting in wheelchair with needs within reach and no further  questions/concerns regarding discharge.   PT Discharge Precautions/Restrictions Precautions Precautions: Fall Restrictions Weight Bearing Restrictions: No Pain Pain Assessment Pain Score: 3  Pain Type: Chronic pain Pain Location: Back Pain Orientation: Lower Pain Frequency: Intermittent Pain Onset: With Activity Pain Intervention(s): Medication (See eMAR) Vision/Perception   No changes from baseline   Cognition Overall Cognitive Status: Within Functional Limits for tasks assessed Arousal/Alertness: Awake/alert Orientation Level: Oriented X4 Attention: Focused;Sustained Focused Attention: Appears intact Sustained Attention: Appears intact Memory: Appears intact Awareness: Appears intact Problem Solving: Appears intact Safety/Judgment: Appears intact Sensation Sensation Light Touch: Impaired Detail Light Touch Impaired Details: Absent RLE;Absent LLE;Impaired LUE;Impaired RUE Stereognosis: Not tested Hot/Cold: Not tested Proprioception: Impaired Detail Proprioception Impaired Details: Impaired RLE Coordination Gross Motor Movements are Fluid and Coordinated: Yes Heel Shin Test: impaired to R side due to weakness Motor  Motor Motor: Within Functional Limits Motor - Discharge Observations: generalized weakness  Mobility Bed Mobility Bed Mobility: Supine to Sit;Sit to Supine Supine to Sit: 6: Modified independent (Device/Increase time);HOB flat Sit to Supine: 6: Modified independent (Device/Increase time);HOB flat Transfers Transfers: Yes Stand Pivot Transfers: 6: Modified independent (Device/Increase time);With armrests Locomotion  Ambulation Ambulation: Yes Ambulation/Gait Assistance: 5: Supervision Ambulation Distance (Feet): 80 Feet Assistive device: Rolling walker Gait Gait: Yes Gait Pattern: Impaired Gait Pattern: Wide base of support;Trunk flexed;Lateral trunk lean to left;Lateral trunk lean to right;Poor foot clearance - right;Poor foot clearance -  left Gait velocity: 10 MWT using RW = 0.53 m/s Stairs / Additional Locomotion Stairs: Yes Stairs Assistance: 5: Supervision Stair Management Technique: Two rails;Step to pattern;Forwards Number of Stairs: 4 Height of Stairs: 6 Ramp: 5: Supervision Curb: 4: Min Chemical engineer: Yes Wheelchair Assistance: 6: Modified independent (Device/Increase time) Environmental health practitioner: Both upper extremities Wheelchair Parts Management: Needs assistance Distance: 150 (with 2 rest breaks)  Trunk/Postural Assessment  Cervical Assessment Cervical Assessment: Exceptions to [facility] (forward head) Thoracic Assessment Thoracic Assessment: Exceptions to [facility] (kyphotic) Lumbar Assessment Lumbar Assessment: Exceptions to [facility] (posterior pelvic tilt) Postural Control Postural Control: Deficits on evaluation Protective Responses: impaired due to peripheral neuropathy  Balance Balance Balance Assessed: Yes Standardized Balance Assessment Standardized Balance Assessment: Berg Balance Test Berg Balance Test Sit to Stand: Able to stand  independently using hands Standing Unsupported: Able to stand 2 minutes with supervision Sitting with Back Unsupported but Feet Supported on Floor or Stool: Able to sit safely and securely 2 minutes Stand to Sit: Sits independently, has uncontrolled descent Transfers: Needs one person to assist Standing Unsupported with Eyes Closed: Able to stand 10 seconds with supervision Standing Ubsupported with Feet Together: Able to place feet together independently and stand for 1 minute with supervision From Standing, Reach Forward with Outstretched Arm: Loses balance while trying/requires external support (unable to perform due to back pain) From Standing Position, Pick up Object from Floor: Unable to try/needs assist to keep balance (unable to perform due to back pain) From Standing Position, Turn to Look Behind Over each Shoulder: Needs assist to keep  from losing balance and falling (unable to perform due to back pain) Turn 360 Degrees: Needs assistance while turning (unable to perform due to back pain) Standing Unsupported, Alternately Place Feet on Step/Stool: Needs assistance to keep from falling or unable to try (unable to perform due to back pain) Standing Unsupported, One Foot in Front: Loses balance while stepping or standing (unable to perform due to back pain) Standing on One Leg: Unable to try or needs assist to prevent fall (unable to perform due to back pain) Total Score: 18 Static Sitting Balance Static Sitting - Balance Support: Bilateral upper extremity supported;Feet supported Static Sitting - Level of Assistance: 7: Independent Dynamic Sitting Balance Dynamic Sitting - Balance Support: Bilateral upper extremity supported;Feet supported Dynamic Sitting - Level of Assistance: 6: Modified independent (Device/Increase time) Static Standing Balance Static Standing - Balance Support: Bilateral upper extremity supported;During functional activity Static Standing - Level of Assistance: 6: Modified independent (Device/Increase time) Dynamic Standing Balance Dynamic Standing - Balance Support: Bilateral upper extremity supported;During functional activity Dynamic Standing - Level of Assistance: 6: Modified independent (Device/Increase time) Extremity Assessment  RUE Assessment RUE Assessment: Within Functional Limits LUE Assessment LUE Assessment: Within Functional Limits LUE AROM (degrees) Overall AROM Left Upper Extremity: Within functional limits for tasks assessed RLE Assessment RLE Assessment: Exceptions to [facility] RLE AROM (degrees) Overall AROM Right Lower Extremity: Deficits;Due to decreased strength;Due to premorbid status RLE Strength RLE Overall Strength: Deficits RLE Overall Strength Comments: hip flexion 3+/5, knee flex/ext 4+/5, ankle DF 3/5  LLE Assessment LLE Assessment: Within Functional Limits (grossly 4 to  4+/5)  See FIM for current functional status  Carney Living A 11/24/2014, 11:00 AM
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
## 2014-11-24 NOTE — Discharge Summary (Signed)
Discharge summary job # [id number]

## 2014-11-24 NOTE — Discharge Instructions (Signed)
Inpatient Rehab Discharge Instructions  Krista KalataMary O Wise Discharge date and time: No discharge date for patient encounter.   Activities/Precautions/ Functional Status: Activity: activity as tolerated Diet: diabetic diet Wound Care: keep wound clean and dry Functional status:  ___ No restrictions     ___ Walk up steps independently ___ 24/7 supervision/assistance   ___ Walk up steps with assistance ___ Intermittent supervision/assistance  ___ Bathe/dress independently ___ Walk with walker     ___ Bathe/dress with assistance ___ Walk Independently    ___ Shower independently _x__ Walk with assistance    ___ Shower with assistance ___ No alcohol     ___ Return to work/school ________   COMMUNITY REFERRALS UPON DISCHARGE:    Home Health:   PT     OT     RN                      Agency: [facility] Phone: [phone]        Special Instructions: Home health nurse to apply Silvadene left foot and toes daily cover with gauze and Kerlix   My questions have been answered and I understand these instructions. I will adhere to these goals and the provided educational materials after my discharge from the hospital.  Patient/Caregiver Signature _______________________________ Date __________  Clinician Signature _______________________________________ Date __________  Please bring this form and your medication list with you to all your follow-up doctor's appointments.

## 2014-11-24 NOTE — Progress Notes (Signed)
Occupational Therapy Discharge Summary  Patient Details  Name: Krista Wise MRN: [id number] Date of Birth: 09/21/1945   Patient has met 9 of 9 long term goals due to improved activity tolerance, improved balance and ability to compensate for deficits.  Pt made steady progress with BADLs and IADLs during this admission.  Pt is mod I for bathing, dressing, toilet transfers and toileting.  Pt requires supervision for shower transfers and simple meal prep.  Pt continues to fatigue quickly and requires more than a reasonable amount of time to complete BADLs.  Pt requires multiple rest breaks during BADLs.  Pt's husband has been present for therapy sessions.  Pt's husband was providing assistance for patient prior to this admission. Patient to discharge at overall Supervision to mod I level.  Patient's care partner is independent to provide the necessary physical assistance at discharge.      Recommendation:  Patient will benefit from ongoing skilled OT services in home health setting to continue to advance functional skills in the area of BADL and iADL.  Equipment: No equipment provided Pt owns BSC and built-in seat in shower  Reasons for discharge: treatment goals met  Patient/family agrees with progress made and goals achieved: Yes  OT Discharge Vision/Perception  Vision- History Baseline Vision/History: Wears glasses;Retinopathy Wears Glasses: Reading only Patient Visual Report: No change from baseline Vision- Assessment Vision Assessment?: No apparent visual deficits Perception Comments: appears WFL  Cognition Overall Cognitive Status: Within Functional Limits for tasks assessed Arousal/Alertness: Awake/alert Orientation Level: Oriented X4 Attention: Focused;Sustained Focused Attention: Appears intact Sustained Attention: Appears intact Memory: Appears intact Awareness: Appears intact Problem Solving: Appears intact Safety/Judgment: Appears  intact Sensation Sensation Light Touch: Impaired Detail Light Touch Impaired Details: Absent RLE;Absent LLE;Impaired LUE;Impaired RUE Stereognosis: Not tested Hot/Cold: Appears Intact Proprioception: Impaired Detail Proprioception Impaired Details: Impaired RLE Coordination Gross Motor Movements are Fluid and Coordinated: Yes Fine Motor Movements are Fluid and Coordinated: Yes Heel Shin Test: impaired to R side due to weakness Motor  Motor Motor: Within Functional Limits Motor - Discharge Observations: generalized weakness Mobility  Bed Mobility Bed Mobility: Supine to Sit;Sit to Supine Supine to Sit: 6: Modified independent (Device/Increase time);HOB flat Sit to Supine: 6: Modified independent (Device/Increase time);HOB flat  Trunk/Postural Assessment  Cervical Assessment Cervical Assessment: Exceptions to WFL (forward head) Thoracic Assessment Thoracic Assessment: Within Functional Limits Lumbar Assessment Lumbar Assessment: Exceptions to WFL (posterior pelvic tilt) Postural Control Postural Control: Deficits on evaluation Protective Responses: impaired due to peripheral neuropathy  Balance Static Sitting Balance Static Sitting - Balance Support: Bilateral upper extremity supported;Feet supported Static Sitting - Level of Assistance: 7: Independent Dynamic Sitting Balance Dynamic Sitting - Balance Support: Bilateral upper extremity supported;Feet supported Dynamic Sitting - Level of Assistance: 6: Modified independent (Device/Increase time) Extremity/Trunk Assessment RUE Assessment RUE Assessment: Within Functional Limits LUE Assessment LUE Assessment: Within Functional Limits LUE AROM (degrees) Overall AROM Left Upper Extremity: Within functional limits for tasks assessed  See FIM for current functional status  Lanier, Thomas Chappell 11/24/2014, 2:22 PM  

## 2014-11-24 NOTE — Progress Notes (Signed)
Occupational Therapy Session Note  Patient Details  Name: Theodoro KalataMary O Jolley MRN: [id number] Date of Birth: [birth year]  Today's Date: 11/24/2014 OT Individual Time: 0900-1000 OT Individual Time Calculation (min): 60 min    Short Term Goals: Week 1:  OT Short Term Goal 1 (Week 1): Patient will perform toilet transfer (ambulating with RW) with supervision OT Short Term Goal 2 (Week 1): Patient will perform LB bathing and dressing with supervision in sit<>stand position OT Short Term Goal 3 (Week 1): Patient will perform at least 2 grooming tasks, standing at sink, with supervision OT Short Term Goal 4 (Week 1): Patient will stand for functional activity with supervision for at least 10 minutes with no seated rest breaks   Skilled Therapeutic Interventions/Progress Updates:    Pt engaged in BADL retraining including toileting, bathing at shower level, and dressing with sit<>stand from chair.  Pt amb with RW to and from bathroom to complete tasks.  Pt continues to fatigue quickly and requires multiple rest breaks throughout session. Pt utilized long handle sponge and reacher to assist with LB bathing and dressing tasks.  Focus on activity tolerance, sit<>stand, standing balance, functional amb with RW for home mgmt tasks, energy conservation, and safety awareness.  Therapy Documentation Precautions:  Precautions Precautions: Fall Precaution Comments: HD catheter has been removed from R chest Restrictions Weight Bearing Restrictions: No Pain: Pain Assessment Pain Assessment: No/denies pain Pain Score: 0-No pain PAINAD (Pain Assessment in Advanced Dementia) Breathing: normal Facial Expression: smiling or inexpressive Body Language: relaxed Consolability: no need to console  See FIM for current functional status  Therapy/Group: Individual Therapy  Rich BraveLanier, Tashunda Vandezande Chappell 11/24/2014, 10:32 AM
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
## 2014-11-25 LAB — GLUCOSE, CAPILLARY: GLUCOSE-CAPILLARY: 171 mg/dL — AB (ref 70–99)

## 2014-11-25 MED ORDER — INSULIN GLARGINE 100 UNIT/ML ~~LOC~~ SOLN: 30.0000 [IU] | Freq: Every day | SUBCUTANEOUS | Status: DC

## 2014-11-25 MED ORDER — SALINE SPRAY 0.65 % NA SOLN: 1.0000 | NASAL | Status: DC | PRN

## 2014-11-25 MED ORDER — METOPROLOL TARTRATE 25 MG PO TABS: 12.5000 mg | ORAL_TABLET | Freq: Two times a day (BID) | ORAL | Status: DC

## 2014-11-25 MED ORDER — TORSEMIDE 20 MG PO TABS: 60.0000 mg | ORAL_TABLET | Freq: Every day | ORAL | Status: DC

## 2014-11-25 MED ORDER — GLIMEPIRIDE 2 MG PO TABS
2.0000 mg | ORAL_TABLET | Freq: Every day | ORAL | Status: DC
Start: 2014-11-25 — End: 2014-12-06

## 2014-11-25 MED ORDER — POTASSIUM CHLORIDE CRYS ER 20 MEQ PO TBCR: 20.0000 meq | EXTENDED_RELEASE_TABLET | Freq: Every day | ORAL | Status: DC

## 2014-11-25 MED ORDER — LIDOCAINE 5 % EX PTCH: 1.0000 | MEDICATED_PATCH | CUTANEOUS | Status: DC

## 2014-11-25 MED ORDER — SILVER SULFADIAZINE 1 % EX CREA: 1.0000 "application " | TOPICAL_CREAM | Freq: Every day | CUTANEOUS | Status: DC

## 2014-11-25 MED ORDER — CEPHALEXIN 250 MG PO CAPS: 250.0000 mg | ORAL_CAPSULE | Freq: Three times a day (TID) | ORAL | Status: DC

## 2014-11-25 MED ORDER — ROPINIROLE HCL 2 MG PO TABS: 2.0000 mg | ORAL_TABLET | Freq: Every day | ORAL | Status: AC

## 2014-11-25 MED ORDER — PANTOPRAZOLE SODIUM 40 MG PO TBEC: 40.0000 mg | DELAYED_RELEASE_TABLET | Freq: Every day | ORAL | Status: DC

## 2014-11-25 MED ORDER — DULOXETINE HCL 60 MG PO CPEP: 60.0000 mg | ORAL_CAPSULE | Freq: Two times a day (BID) | ORAL | Status: AC

## 2014-11-25 MED ORDER — SODIUM BICARBONATE 650 MG PO TABS: 650.0000 mg | ORAL_TABLET | Freq: Two times a day (BID) | ORAL | Status: DC

## 2014-11-25 MED ORDER — FERROUS SULFATE 325 (65 FE) MG PO TABS: 325.0000 mg | ORAL_TABLET | Freq: Two times a day (BID) | ORAL | Status: AC

## 2014-11-25 MED ORDER — VITAMIN D3 25 MCG (1000 UT) PO CAPS: 1000.0000 [IU] | ORAL_CAPSULE | Freq: Every day | ORAL | Status: AC

## 2014-11-25 MED ORDER — ATORVASTATIN CALCIUM 40 MG PO TABS: 40.0000 mg | ORAL_TABLET | Freq: Every day | ORAL | Status: AC

## 2014-11-25 NOTE — Progress Notes (Signed)
Pt discharged home with husband. Discharge instructions provided by Harvel Ricksan Anguilli, PA. All questions answered. Pt verbalized understanding. Pt escorted off unit in w/c with personal belonging by [place], [place].
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
## 2014-11-25 NOTE — Progress Notes (Signed)
Social Work  Discharge Note  The overall goal for the admission was met for:   Discharge location: Yes - home with spouse and other family providing 24/7 assistance  Length of Stay: Yes - 10 days  Discharge activity level: Yes - supervision overall  Home/community participation: Yes  Services provided included: MD, RD, PT, OT, RN, TR, Pharmacy and SW  Financial Services: Medicare and Private Insurance: [place] [employer] [place]  Follow-up services arranged: Home Health: RN, PT, OT via [place] and Patient/Family request agency HH: [facility], DME: NA  Comments (or additional information):  Patient/Family verbalized understanding of follow-up arrangements: Yes  Individual responsible for coordination of the follow-up plan: pt  Confirmed correct DME delivered: NA - pt had all needed DME PTA    Patience Nuzzo

## 2014-11-26 ENCOUNTER — Emergency Department (HOSPITAL_COMMUNITY): Payer: Medicare Other

## 2014-11-26 ENCOUNTER — Other Ambulatory Visit (HOSPITAL_COMMUNITY): Payer: Self-pay

## 2014-11-26 ENCOUNTER — Inpatient Hospital Stay (HOSPITAL_COMMUNITY)
Admission: EM | Admit: 2014-11-26 | Discharge: 2014-12-06 | DRG: 871 | Disposition: A | Discharge status: Skilled Nursing Facility | Payer: Medicare Other | Attending: Internal Medicine | Admitting: Internal Medicine

## 2014-11-26 ENCOUNTER — Encounter (HOSPITAL_COMMUNITY): Payer: Self-pay | Admitting: Emergency Medicine

## 2014-11-26 DIAGNOSIS — N189 Chronic kidney disease, unspecified: Secondary | ICD-10-CM | POA: Diagnosis not present

## 2014-11-26 DIAGNOSIS — A0472 Enterocolitis due to Clostridium difficile, not specified as recurrent: Secondary | ICD-10-CM | POA: Diagnosis present

## 2014-11-26 DIAGNOSIS — J189 Pneumonia, unspecified organism: Secondary | ICD-10-CM | POA: Diagnosis present

## 2014-11-26 DIAGNOSIS — Z7982 Long term (current) use of aspirin: Secondary | ICD-10-CM

## 2014-11-26 DIAGNOSIS — D631 Anemia in chronic kidney disease: Secondary | ICD-10-CM | POA: Diagnosis present

## 2014-11-26 DIAGNOSIS — R52 Pain, unspecified: Secondary | ICD-10-CM

## 2014-11-26 DIAGNOSIS — I272 Other secondary pulmonary hypertension: Secondary | ICD-10-CM | POA: Diagnosis present

## 2014-11-26 DIAGNOSIS — I2510 Atherosclerotic heart disease of native coronary artery without angina pectoris: Secondary | ICD-10-CM | POA: Diagnosis present

## 2014-11-26 DIAGNOSIS — L989 Disorder of the skin and subcutaneous tissue, unspecified: Secondary | ICD-10-CM | POA: Diagnosis not present

## 2014-11-26 DIAGNOSIS — N184 Chronic kidney disease, stage 4 (severe): Secondary | ICD-10-CM | POA: Diagnosis present

## 2014-11-26 DIAGNOSIS — Z6837 Body mass index (BMI) 37.0-37.9, adult: Secondary | ICD-10-CM | POA: Diagnosis not present

## 2014-11-26 DIAGNOSIS — E1169 Type 2 diabetes mellitus with other specified complication: Secondary | ICD-10-CM | POA: Diagnosis present

## 2014-11-26 DIAGNOSIS — Z955 Presence of coronary angioplasty implant and graft: Secondary | ICD-10-CM | POA: Diagnosis not present

## 2014-11-26 DIAGNOSIS — A419 Sepsis, unspecified organism: Principal | ICD-10-CM

## 2014-11-26 DIAGNOSIS — I5043 Acute on chronic combined systolic (congestive) and diastolic (congestive) heart failure: Secondary | ICD-10-CM | POA: Diagnosis present

## 2014-11-26 DIAGNOSIS — Y92009 Unspecified place in unspecified non-institutional (private) residence as the place of occurrence of the external cause: Secondary | ICD-10-CM | POA: Diagnosis not present

## 2014-11-26 DIAGNOSIS — A047 Enterocolitis due to Clostridium difficile: Secondary | ICD-10-CM | POA: Diagnosis present

## 2014-11-26 DIAGNOSIS — E785 Hyperlipidemia, unspecified: Secondary | ICD-10-CM | POA: Diagnosis present

## 2014-11-26 DIAGNOSIS — Z90710 Acquired absence of both cervix and uterus: Secondary | ICD-10-CM | POA: Diagnosis not present

## 2014-11-26 DIAGNOSIS — G4733 Obstructive sleep apnea (adult) (pediatric): Secondary | ICD-10-CM | POA: Diagnosis present

## 2014-11-26 DIAGNOSIS — M1990 Unspecified osteoarthritis, unspecified site: Secondary | ICD-10-CM | POA: Diagnosis present

## 2014-11-26 DIAGNOSIS — Y95 Nosocomial condition: Secondary | ICD-10-CM | POA: Diagnosis present

## 2014-11-26 DIAGNOSIS — T25022D Burn of unspecified degree of left foot, subsequent encounter: Secondary | ICD-10-CM | POA: Diagnosis not present

## 2014-11-26 DIAGNOSIS — E875 Hyperkalemia: Secondary | ICD-10-CM | POA: Diagnosis present

## 2014-11-26 DIAGNOSIS — I129 Hypertensive chronic kidney disease with stage 1 through stage 4 chronic kidney disease, or unspecified chronic kidney disease: Secondary | ICD-10-CM | POA: Diagnosis present

## 2014-11-26 DIAGNOSIS — N183 Chronic kidney disease, stage 3 unspecified: Secondary | ICD-10-CM | POA: Diagnosis present

## 2014-11-26 DIAGNOSIS — E872 Acidosis: Secondary | ICD-10-CM | POA: Diagnosis present

## 2014-11-26 DIAGNOSIS — Z8249 Family history of ischemic heart disease and other diseases of the circulatory system: Secondary | ICD-10-CM | POA: Diagnosis not present

## 2014-11-26 DIAGNOSIS — E1122 Type 2 diabetes mellitus with diabetic chronic kidney disease: Secondary | ICD-10-CM | POA: Diagnosis present

## 2014-11-26 DIAGNOSIS — I509 Heart failure, unspecified: Secondary | ICD-10-CM

## 2014-11-26 DIAGNOSIS — W19XXXA Unspecified fall, initial encounter: Secondary | ICD-10-CM | POA: Diagnosis present

## 2014-11-26 DIAGNOSIS — E118 Type 2 diabetes mellitus with unspecified complications: Secondary | ICD-10-CM | POA: Diagnosis not present

## 2014-11-26 DIAGNOSIS — J9601 Acute respiratory failure with hypoxia: Secondary | ICD-10-CM | POA: Diagnosis present

## 2014-11-26 DIAGNOSIS — N170 Acute kidney failure with tubular necrosis: Secondary | ICD-10-CM | POA: Diagnosis present

## 2014-11-26 DIAGNOSIS — R579 Shock, unspecified: Secondary | ICD-10-CM

## 2014-11-26 DIAGNOSIS — E1142 Type 2 diabetes mellitus with diabetic polyneuropathy: Secondary | ICD-10-CM | POA: Diagnosis present

## 2014-11-26 DIAGNOSIS — I255 Ischemic cardiomyopathy: Secondary | ICD-10-CM | POA: Diagnosis not present

## 2014-11-26 DIAGNOSIS — N390 Urinary tract infection, site not specified: Secondary | ICD-10-CM | POA: Diagnosis present

## 2014-11-26 DIAGNOSIS — J9690 Respiratory failure, unspecified, unspecified whether with hypoxia or hypercapnia: Secondary | ICD-10-CM

## 2014-11-26 DIAGNOSIS — I214 Non-ST elevation (NSTEMI) myocardial infarction: Secondary | ICD-10-CM | POA: Diagnosis present

## 2014-11-26 DIAGNOSIS — L97529 Non-pressure chronic ulcer of other part of left foot with unspecified severity: Secondary | ICD-10-CM | POA: Diagnosis present

## 2014-11-26 DIAGNOSIS — I447 Left bundle-branch block, unspecified: Secondary | ICD-10-CM | POA: Diagnosis present

## 2014-11-26 DIAGNOSIS — N179 Acute kidney failure, unspecified: Secondary | ICD-10-CM | POA: Diagnosis not present

## 2014-11-26 DIAGNOSIS — I471 Supraventricular tachycardia: Secondary | ICD-10-CM | POA: Diagnosis not present

## 2014-11-26 DIAGNOSIS — R6521 Severe sepsis with septic shock: Secondary | ICD-10-CM

## 2014-11-26 DIAGNOSIS — F329 Major depressive disorder, single episode, unspecified: Secondary | ICD-10-CM | POA: Diagnosis present

## 2014-11-26 DIAGNOSIS — K219 Gastro-esophageal reflux disease without esophagitis: Secondary | ICD-10-CM | POA: Diagnosis present

## 2014-11-26 DIAGNOSIS — I25110 Atherosclerotic heart disease of native coronary artery with unstable angina pectoris: Secondary | ICD-10-CM | POA: Diagnosis not present

## 2014-11-26 DIAGNOSIS — Z794 Long term (current) use of insulin: Secondary | ICD-10-CM | POA: Diagnosis not present

## 2014-11-26 DIAGNOSIS — D638 Anemia in other chronic diseases classified elsewhere: Secondary | ICD-10-CM | POA: Diagnosis not present

## 2014-11-26 DIAGNOSIS — I270 Primary pulmonary hypertension: Secondary | ICD-10-CM | POA: Diagnosis not present

## 2014-11-26 DIAGNOSIS — Z452 Encounter for adjustment and management of vascular access device: Secondary | ICD-10-CM

## 2014-11-26 LAB — CBC WITH DIFFERENTIAL/PLATELET
Basophils Absolute: 0 10*3/uL (ref 0.0–0.1)
Basophils Relative: 0 % (ref 0–1)
EOS ABS: 0 10*3/uL (ref 0.0–0.7)
Eosinophils Relative: 0 % (ref 0–5)
HCT: 29.6 % — ABNORMAL LOW (ref 36.0–46.0)
Hemoglobin: 8.9 g/dL — ABNORMAL LOW (ref 12.0–15.0)
LYMPHS ABS: 0.7 10*3/uL (ref 0.7–4.0)
Lymphocytes Relative: 5 % — ABNORMAL LOW (ref 12–46)
MCH: 26.1 pg (ref 26.0–34.0)
MCHC: 30.1 g/dL (ref 30.0–36.0)
MCV: 86.8 fL (ref 78.0–100.0)
MONO ABS: 0.6 10*3/uL (ref 0.1–1.0)
MONOS PCT: 5 % (ref 3–12)
NEUTROS PCT: 90 % — AB (ref 43–77)
Neutro Abs: 12.3 10*3/uL — ABNORMAL HIGH (ref 1.7–7.7)
Platelets: 225 10*3/uL (ref 150–400)
RBC: 3.41 MIL/uL — AB (ref 3.87–5.11)
RDW: 20.7 % — ABNORMAL HIGH (ref 11.5–15.5)
WBC: 13.8 10*3/uL — ABNORMAL HIGH (ref 4.0–10.5)

## 2014-11-26 LAB — I-STAT VENOUS BLOOD GAS, ED
ACID-BASE DEFICIT: 6 mmol/L — AB (ref 0.0–2.0)
Acid-base deficit: 6 mmol/L — ABNORMAL HIGH (ref 0.0–2.0)
BICARBONATE: 19.4 meq/L — AB (ref 20.0–24.0)
BICARBONATE: 20.4 meq/L (ref 20.0–24.0)
O2 Saturation: 53 %
O2 Saturation: 73 %
PH VEN: 7.297 (ref 7.250–7.300)
TCO2: 21 mmol/L (ref 0–100)
TCO2: 22 mmol/L (ref 0–100)
pCO2, Ven: 38.1 mmHg — ABNORMAL LOW (ref 45.0–50.0)
pCO2, Ven: 41.7 mmHg — ABNORMAL LOW (ref 45.0–50.0)
pH, Ven: 7.315 — ABNORMAL HIGH (ref 7.250–7.300)
pO2, Ven: 30 mmHg (ref 30.0–45.0)
pO2, Ven: 43 mmHg (ref 30.0–45.0)

## 2014-11-26 LAB — URINALYSIS, ROUTINE W REFLEX MICROSCOPIC
Bilirubin Urine: NEGATIVE
Bilirubin Urine: NEGATIVE
Glucose, UA: NEGATIVE mg/dL
Glucose, UA: 100 mg/dL — AB
Hgb urine dipstick: NEGATIVE
Ketones, ur: NEGATIVE mg/dL
Ketones, ur: NEGATIVE mg/dL
NITRITE: NEGATIVE
NITRITE: POSITIVE — AB
Protein, ur: NEGATIVE mg/dL
Protein, ur: 30 mg/dL — AB
SPECIFIC GRAVITY, URINE: 1.016 (ref 1.005–1.030)
Specific Gravity, Urine: 1.021 (ref 1.005–1.030)
UROBILINOGEN UA: 0.2 mg/dL (ref 0.0–1.0)
Urobilinogen, UA: 0.2 mg/dL (ref 0.0–1.0)
pH: 5 (ref 5.0–8.0)
pH: 5 (ref 5.0–8.0)

## 2014-11-26 LAB — URINE CULTURE

## 2014-11-26 LAB — URINE MICROSCOPIC-ADD ON

## 2014-11-26 LAB — TROPONIN I
TROPONIN I: 0.74 ng/mL — AB (ref <0.031)
TROPONIN I: 1.35 ng/mL — AB (ref <0.031)
TROPONIN I: 4.06 ng/mL — AB (ref <0.031)

## 2014-11-26 LAB — COMPREHENSIVE METABOLIC PANEL
ALT: 14 U/L (ref 14–54)
AST: 23 U/L (ref 15–41)
Albumin: 2.6 g/dL — ABNORMAL LOW (ref 3.5–5.0)
Alkaline Phosphatase: 82 U/L (ref 38–126)
Anion gap: 18 — ABNORMAL HIGH (ref 5–15)
BUN: 48 mg/dL — ABNORMAL HIGH (ref 6–20)
CO2: 20 mmol/L — ABNORMAL LOW (ref 22–32)
Calcium: 8.7 mg/dL — ABNORMAL LOW (ref 8.9–10.3)
Chloride: 97 mmol/L — ABNORMAL LOW (ref 101–111)
Creatinine, Ser: 3 mg/dL — ABNORMAL HIGH (ref 0.44–1.00)
GFR calc Af Amer: 18 mL/min — ABNORMAL LOW (ref >60)
GFR calc non Af Amer: 15 mL/min — ABNORMAL LOW (ref >60)
Glucose, Bld: 325 mg/dL — ABNORMAL HIGH (ref 70–99)
Potassium: 5.6 mmol/L — ABNORMAL HIGH (ref 3.5–5.1)
Sodium: 135 mmol/L (ref 135–145)
Total Bilirubin: 0.7 mg/dL (ref 0.3–1.2)
Total Protein: 6.3 g/dL — ABNORMAL LOW (ref 6.5–8.1)

## 2014-11-26 LAB — PROCALCITONIN: Procalcitonin: 1.55 ng/mL

## 2014-11-26 LAB — PROTIME-INR
INR: 1.13 (ref 0.00–1.49)
INR: 1.2 (ref 0.00–1.49)
PROTHROMBIN TIME: 14.7 s (ref 11.6–15.2)
Prothrombin Time: 15.3 seconds — ABNORMAL HIGH (ref 11.6–15.2)

## 2014-11-26 LAB — GLUCOSE, CAPILLARY
GLUCOSE-CAPILLARY: 314 mg/dL — AB (ref 70–99)
GLUCOSE-CAPILLARY: 224 mg/dL — AB (ref 70–99)
Glucose-Capillary: 302 mg/dL — ABNORMAL HIGH (ref 70–99)

## 2014-11-26 LAB — CBG MONITORING, ED: Glucose-Capillary: 297 mg/dL — ABNORMAL HIGH (ref 70–99)

## 2014-11-26 LAB — CORTISOL: CORTISOL PLASMA: 38.5 ug/dL

## 2014-11-26 LAB — I-STAT CG4 LACTIC ACID, ED
Lactic Acid, Venous: 8.35 mmol/L (ref 0.5–2.0)
Lactic Acid, Venous: 2.97 mmol/L (ref 0.5–2.0)

## 2014-11-26 LAB — APTT: APTT: 28 s (ref 24–37)

## 2014-11-26 LAB — MRSA PCR SCREENING: MRSA BY PCR: NEGATIVE

## 2014-11-26 LAB — CK: CK TOTAL: 15 U/L — AB (ref 38–234)

## 2014-11-26 LAB — LACTIC ACID, PLASMA: Lactic Acid, Venous: 5.1 mmol/L (ref 0.5–2.0)

## 2014-11-26 LAB — CLOSTRIDIUM DIFFICILE BY PCR: Toxigenic C. Difficile by PCR: POSITIVE — AB

## 2014-11-26 MED ORDER — SODIUM CHLORIDE 0.9 % IV BOLUS (SEPSIS)
750.0000 mL | Freq: Once | INTRAVENOUS | Status: AC
  Administered 2014-11-26: 750 mL via INTRAVENOUS

## 2014-11-26 MED ORDER — METRONIDAZOLE IN NACL 5-0.79 MG/ML-% IV SOLN
500.0000 mg | Freq: Three times a day (TID) | INTRAVENOUS | Status: DC
  Administered 2014-11-26 – 2014-11-27 (×2): 500 mg via INTRAVENOUS
  Filled 2014-11-26 (×4): qty 100

## 2014-11-26 MED ORDER — ATORVASTATIN CALCIUM 40 MG PO TABS
40.0000 mg | ORAL_TABLET | Freq: Every day | ORAL | Status: DC
  Administered 2014-11-26 – 2014-12-05 (×10): 40 mg via ORAL
  Filled 2014-11-26 (×10): qty 1

## 2014-11-26 MED ORDER — PIPERACILLIN-TAZOBACTAM 3.375 G IVPB 30 MIN
3.3750 g | Freq: Once | INTRAVENOUS | Status: DC
  Filled 2014-11-26: qty 50

## 2014-11-26 MED ORDER — CHLORHEXIDINE GLUCONATE 0.12 % MT SOLN
15.0000 mL | Freq: Two times a day (BID) | OROMUCOSAL | Status: DC
  Administered 2014-11-26 – 2014-12-02 (×12): 15 mL via OROMUCOSAL
  Filled 2014-11-26 (×8): qty 15

## 2014-11-26 MED ORDER — SODIUM CHLORIDE 0.9 % IV SOLN: INTRAVENOUS | Status: DC

## 2014-11-26 MED ORDER — SODIUM CHLORIDE 0.9 % IV BOLUS (SEPSIS)
3000.0000 mL | INTRAVENOUS | Status: DC
  Administered 2014-11-26: 3000 mL via INTRAVENOUS

## 2014-11-26 MED ORDER — NOREPINEPHRINE BITARTRATE 1 MG/ML IV SOLN
0.0000 ug/min | Freq: Once | INTRAVENOUS | Status: AC
  Administered 2014-11-26: 5 ug/min via INTRAVENOUS
  Filled 2014-11-26: qty 4

## 2014-11-26 MED ORDER — DEXTROSE 5 % IV SOLN
0.0000 ug/min | INTRAVENOUS | Status: DC
  Administered 2014-11-26: 20 ug/min via INTRAVENOUS
  Administered 2014-11-26: 15 ug/min via INTRAVENOUS
  Administered 2014-11-27: 3 ug/min via INTRAVENOUS
  Administered 2014-11-28: 5 ug/min via INTRAVENOUS
  Administered 2014-11-28 – 2014-11-29 (×2): 3 ug/min via INTRAVENOUS
  Filled 2014-11-26 (×6): qty 4

## 2014-11-26 MED ORDER — SODIUM CHLORIDE 0.9 % IV SOLN
INTRAVENOUS | Status: DC
  Administered 2014-11-26: 20:00:00 via INTRAVENOUS

## 2014-11-26 MED ORDER — HEPARIN SODIUM (PORCINE) 5000 UNIT/ML IJ SOLN: 5000.0000 [IU] | Freq: Three times a day (TID) | INTRAMUSCULAR | Status: DC

## 2014-11-26 MED ORDER — PIPERACILLIN-TAZOBACTAM 3.375 G IVPB 30 MIN
3.3750 g | INTRAVENOUS | Status: AC
  Administered 2014-11-26: 3.375 g via INTRAVENOUS

## 2014-11-26 MED ORDER — PANTOPRAZOLE SODIUM 40 MG IV SOLR
40.0000 mg | Freq: Every day | INTRAVENOUS | Status: DC
  Administered 2014-11-26: 40 mg via INTRAVENOUS
  Filled 2014-11-26: qty 40

## 2014-11-26 MED ORDER — METRONIDAZOLE IN NACL 5-0.79 MG/ML-% IV SOLN: 500.0000 mg | Freq: Three times a day (TID) | INTRAVENOUS | Status: DC

## 2014-11-26 MED ORDER — LACTATED RINGERS IV SOLN
INTRAVENOUS | Status: DC
  Administered 2014-11-26: 13:00:00 via INTRAVENOUS

## 2014-11-26 MED ORDER — INSULIN ASPART 100 UNIT/ML ~~LOC~~ SOLN
0.0000 [IU] | SUBCUTANEOUS | Status: DC
  Administered 2014-11-26: 11 [IU] via SUBCUTANEOUS
  Administered 2014-11-26: 5 [IU] via SUBCUTANEOUS
  Administered 2014-11-27: 2 [IU] via SUBCUTANEOUS
  Administered 2014-11-27 (×2): 3 [IU] via SUBCUTANEOUS

## 2014-11-26 MED ORDER — CLOPIDOGREL BISULFATE 300 MG PO TABS
300.0000 mg | ORAL_TABLET | Freq: Once | ORAL | Status: AC
  Administered 2014-11-27: 300 mg via ORAL
  Filled 2014-11-26: qty 1

## 2014-11-26 MED ORDER — PIPERACILLIN-TAZOBACTAM 3.375 G IVPB
3.3750 g | Freq: Three times a day (TID) | INTRAVENOUS | Status: DC
  Administered 2014-11-26 – 2014-11-28 (×6): 3.375 g via INTRAVENOUS
  Filled 2014-11-26 (×7): qty 50

## 2014-11-26 MED ORDER — VANCOMYCIN HCL 10 G IV SOLR
2000.0000 mg | INTRAVENOUS | Status: AC
  Administered 2014-11-26: 2000 mg via INTRAVENOUS
  Filled 2014-11-26: qty 2000

## 2014-11-26 MED ORDER — ASPIRIN EC 81 MG PO TBEC
81.0000 mg | DELAYED_RELEASE_TABLET | Freq: Every day | ORAL | Status: DC
  Administered 2014-11-26 – 2014-12-06 (×11): 81 mg via ORAL
  Filled 2014-11-26 (×11): qty 1

## 2014-11-26 MED ORDER — VANCOMYCIN HCL IN DEXTROSE 1-5 GM/200ML-% IV SOLN: 1000.0000 mg | INTRAVENOUS | Status: DC

## 2014-11-26 MED ORDER — HEPARIN (PORCINE) IN NACL 100-0.45 UNIT/ML-% IJ SOLN
1650.0000 [IU]/h | INTRAMUSCULAR | Status: DC
  Administered 2014-11-26: 1100 [IU]/h via INTRAVENOUS
  Administered 2014-11-26: 3000 [IU]/h via INTRAVENOUS
  Administered 2014-11-27 – 2014-11-29 (×3): 1350 [IU]/h via INTRAVENOUS
  Administered 2014-11-30 (×2): 1650 [IU]/h via INTRAVENOUS
  Filled 2014-11-26 (×11): qty 250

## 2014-11-26 MED ORDER — CLOPIDOGREL BISULFATE 75 MG PO TABS
75.0000 mg | ORAL_TABLET | Freq: Every day | ORAL | Status: DC
  Administered 2014-11-27 – 2014-12-06 (×10): 75 mg via ORAL
  Filled 2014-11-26 (×10): qty 1

## 2014-11-26 MED ORDER — CETYLPYRIDINIUM CHLORIDE 0.05 % MT LIQD
7.0000 mL | Freq: Two times a day (BID) | OROMUCOSAL | Status: DC
  Administered 2014-11-27 – 2014-12-02 (×12): 7 mL via OROMUCOSAL

## 2014-11-26 MED ORDER — VANCOMYCIN 50 MG/ML ORAL SOLUTION
125.0000 mg | Freq: Four times a day (QID) | ORAL | Status: DC
  Administered 2014-11-26 – 2014-11-28 (×6): 125 mg via ORAL
  Filled 2014-11-26 (×11): qty 2.5

## 2014-11-26 MED ORDER — METRONIDAZOLE IN NACL 5-0.79 MG/ML-% IV SOLN
500.0000 mg | Freq: Once | INTRAVENOUS | Status: AC
  Administered 2014-11-26: 500 mg via INTRAVENOUS
  Filled 2014-11-26: qty 100

## 2014-11-26 MED ORDER — INSULIN ASPART 100 UNIT/ML ~~LOC~~ SOLN
2.0000 [IU] | SUBCUTANEOUS | Status: DC
  Administered 2014-11-26: 6 [IU] via SUBCUTANEOUS

## 2014-11-26 MED ORDER — SODIUM POLYSTYRENE SULFONATE 15 GM/60ML PO SUSP: 15.0000 g | Freq: Once | ORAL | Status: DC

## 2014-11-26 MED ORDER — CLOPIDOGREL BISULFATE 300 MG PO TABS: 300.0000 mg | ORAL_TABLET | Freq: Every day | ORAL | Status: DC

## 2014-11-26 MED ORDER — SODIUM CHLORIDE 0.9 % IV SOLN: 250.0000 mL | INTRAVENOUS | Status: DC | PRN

## 2014-11-26 MED ORDER — HEPARIN BOLUS VIA INFUSION
3000.0000 [IU] | Freq: Once | INTRAVENOUS | Status: AC
Start: 2014-11-26 — End: 2014-11-26
  Administered 2014-11-26: 3000 [IU] via INTRAVENOUS
  Filled 2014-11-26: qty 3000

## 2014-11-26 MED ORDER — VANCOMYCIN HCL IN DEXTROSE 1-5 GM/200ML-% IV SOLN
1000.0000 mg | Freq: Once | INTRAVENOUS | Status: DC
  Filled 2014-11-26: qty 200
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
## 2014-11-26 NOTE — ED Notes (Signed)
MD Nanavati at bedside. [facility]ashcart at bedside. Pt monitored by pulse ox, bp cuff, and 12-lead.

## 2014-11-26 NOTE — Progress Notes (Signed)
eLink Physician-Brief Progress Note Patient Name: Krista Wise DOB: 1946/07/21 MRN: [id number]   Date of Service  11/26/2014  HPI/Events of Note  Glu up  eICU Interventions  ssi mod add     Intervention Category Intermediate Interventions: Hyperglycemia - evaluation and treatment  Kyrus Hyde J. 11/26/2014, 8:00 PM

## 2014-11-26 NOTE — ED Notes (Signed)
Dr. Rhunette CroftNanavati notified that pt is having increased dyspnea. Pt reposition and placed on 4L [place].

## 2014-11-26 NOTE — ED Notes (Signed)
Dr. Rhunette CroftNanavati said to start Levophed peripherally and prepare to place a central line.

## 2014-11-26 NOTE — Progress Notes (Addendum)
ANTIBIOTIC CONSULT NOTE - INITIAL  Pharmacy Consult for vancomycin and Zosyn Indication: sepsis  No Known Allergies  Patient Measurements: Height: 5\' 6"  (167.6 cm) Weight: 217 lb (98.431 kg) IBW/kg (Calculated) : 59.3  Vital Signs: Temp: 98.5 F (36.9 C) (05/04 0945) BP: 50/33 mmHg (05/04 1030) Pulse Rate: 101 (05/04 1010) Intake/Output from previous day:   Intake/Output from this shift:    Labs:  Recent Labs  11/26/14 1000  WBC 13.8*  HGB 8.9*  PLT 225   Estimated Creatinine Clearance: 26.1 mL/min (by C-G formula based on Cr of 2.51). No results for input(s): VANCOTROUGH, VANCOPEAK, VANCORANDOM, GENTTROUGH, GENTPEAK, GENTRANDOM, TOBRATROUGH, TOBRAPEAK, TOBRARND, AMIKACINPEAK, AMIKACINTROU, AMIKACIN in the last 72 hours.   Microbiology: Recent Results (from the past 720 hour(s))  Culture, Urine     Status: None (Preliminary result)   Collection Time: 11/23/14  4:07 AM  Result Value Ref Range Status   Specimen Description URINE, CLEAN CATCH  Final   Special Requests NONE  Final   Colony Count   Final    >=100,000 COLONIES/ML Performed at [facility]    Culture   Final    GRAM NEGATIVE RODS Performed at [facility]    Report Status PENDING  Incomplete    Medical History: Past Medical History  Diagnosis Date  . Hypertension   . Depression   . Diabetes mellitus   . GERD (gastroesophageal reflux disease)   . Obesity   . Coronary artery disease 08/2006    Unstable angina. PCI to distal codominant CFX with 2.5 x 20 Taxus DES...  . CHF (congestive heart failure)   . Sleep apnea     stopped cpap  . Arthritis   . Headache(784.0)   . Hx of echocardiogram     Echo (11/15):  Mild LVH, EF 55-60%, no RWMA, Gr 1 DD, mild AI, mild MR, mod LAE.     Medications:  See electronic med hx  Assessment: 69 y/o female who presents to the ED with generalized weakness and fall, code sepsis called. She had a recent inpatient admission and CIR admission  and was discharged on 5/3. Pharmacy consulted to begin vancomycin and Zosyn.  Lactic acid is elevated at 8.35, WBC are 13.8, new cultures are pending, and she is afebrile. Last admit patient received CRRT and I-HD - SCr 2.51 on 4/25, CMP in process. Of note, patient was discharged from CIR on Keflex for UTI.  5/1 UCx: >100K GNR  Goal of Therapy:  Vancomycin trough level 15-20 mcg/ml Eradication of infection  Plan:  - Vancomycin 2000 mg IV now then 1000 mg IV q24h - Zosyn 3.375 g IV now (over 30 min) then 3.375 g IV q8h (over 4 hrs) - Monitor renal function, clinical course, and culture data  [facility]ennifer Woodlawn, Pharm.D., BCPS Clinical Pharmacist Pager: [phone] 11/26/2014 10:49 AM   Addendum: SCr is 3, CrCl ~22 ml/min. Also C diff +.  Doses are appropriate for now but may need to be adjusted if renal function worsens.  Flagyl 500 mg IV x1 ordered. Attempted to call Dr. Rhunette CroftNanavati to start C diff protocol but he is with another patient at the moment.  MuncyJennifer Weldon, 1700 Rainbow BoulevardPharm.D., BCPS Clinical Pharmacist Pager: [phone] 11/26/2014 1:39 PM

## 2014-11-26 NOTE — Progress Notes (Signed)
Pt transported to 50M, report called to RT.

## 2014-11-26 NOTE — ED Notes (Signed)
2L [place] completed.

## 2014-11-26 NOTE — ED Notes (Signed)
NOTIFIED MATTHEW BEASLEY ,RN IN PERSON FOR PATIENTS LAB RESULTS. DR.NATAVATI NOT AVAILABLE AT PRESENT  FOR CG4+ LACTIC ACID @13 :50PM, 11/26/2014.

## 2014-11-26 NOTE — ED Notes (Signed)
NOTIFIED DR. NAVATI IN PERSON FOR PATIENTS LAB RESULTS OF CG4+LACTIC ACID @10 :31AM, 11/26/2014.

## 2014-11-26 NOTE — ED Notes (Signed)
Levophed now running through the central line.

## 2014-11-26 NOTE — ED Notes (Signed)
Respiratory at the bedside to Place Bi-Pap per NP Hoffman verbal order.

## 2014-11-26 NOTE — Progress Notes (Signed)
eLink Physician-Brief Progress Note Patient Name: Krista KalataMary O Venti DOB: August 31, 1945 MRN: [id number]   Date of Service  11/26/2014  HPI/Events of Note  LA went down then back to 5, levo requirement same, no cvp  eICU Interventions  Place orders for sepsis2, cvp , bolus, repeat lactic     Intervention Category Intermediate Interventions: Diagnostic test evaluation  Nelda BucksFEINSTEIN,DANIEL J. 11/26/2014, 6:59 PM

## 2014-11-26 NOTE — Progress Notes (Signed)
eLink Physician-Brief Progress Note Patient Name: Krista KalataMary O Wise DOB: 05/18/1946 MRN: [id number]   Date of Service  11/26/2014  HPI/Events of Note  Trop to 4 from 1 , crt 3, no BM since admission,  ecg reviewed showed significvant new IVCD Recent work up pos for ischemia  eICU Interventions  Asa, satin, hep drip Cards called     Intervention Category Major Interventions: Arrhythmia - evaluation and management;Hypotension - evaluation and management  Nelda BucksFEINSTEIN,Barbi Kumagai J. 11/26/2014, 9:39 PM

## 2014-11-26 NOTE — Progress Notes (Signed)
ANTICOAGULATION CONSULT NOTE - Initial Consult  Pharmacy Consult for Heparin Indication: chest pain/ACS  No Known Allergies  Patient Measurements: Height: 5\' 6"  (167.6 cm) Weight: 217 lb (98.431 kg) IBW/kg (Calculated) : 59.3 Heparin Dosing Weight: 83 kg  Vital Signs: Temp: 99.2 F (37.3 C) (05/04 2000) Temp Source: Oral (05/04 2000) BP: 107/65 mmHg (05/04 2030) Pulse Rate: 102 (05/04 2030)  Labs:  Recent Labs  11/26/14 1000 11/26/14 1433 11/26/14 1630 11/26/14 1945  HGB 8.9*  --   --   --   HCT 29.6*  --   --   --   PLT 225  --   --   --   APTT 28  --   --   --   LABPROT 14.7  --  15.3*  --   INR 1.13  --  1.20  --   CREATININE 3.00*  --   --   --   CKTOTAL 15*  --   --   --   TROPONINI 0.74* 1.35*  --  4.06*    Estimated Creatinine Clearance: 21.8 mL/min (by C-G formula based on Cr of 3).   Medical History: Past Medical History  Diagnosis Date  . Hypertension   . Depression   . Diabetes mellitus   . GERD (gastroesophageal reflux disease)   . Obesity   . Coronary artery disease 08/2006    Unstable angina. PCI to distal codominant CFX with 2.5 x 20 Taxus DES...  . CHF (congestive heart failure)   . Sleep apnea     stopped cpap  . Arthritis   . Headache(784.0)   . Hx of echocardiogram     Echo (11/15):  Mild LVH, EF 55-60%, no RWMA, Gr 1 DD, mild AI, mild MR, mod LAE.      Assessment: 69 year old female with positive cardiac enzymes and renal insufficiency to begin heparin Also Cdiff positive on po vancomycin  Goal of Therapy:  Heparin level 0.3-0.7 units/ml Monitor platelets by anticoagulation protocol: Yes   Plan:  Heparin 3000 units iv bolus x 1 Heparin drip at 1100 units / hr Daily heparin level, CBC  Thank you. Okey RegalLisa Jonavan Vanhorn, PharmD [phone]  Elwin SleightPowell, [place] Kay 11/26/2014,9:37 PM
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
## 2014-11-26 NOTE — ED Notes (Signed)
Dr. Rhunette CroftNanavati notified that pts BP is not improving after 800ml of fluid, Levophed to be ordered.

## 2014-11-26 NOTE — ED Notes (Addendum)
Critical Troponin 0.74 per Bea LauraBrenda Castello.  MD Rhunette CroftNanavati made aware.

## 2014-11-26 NOTE — Progress Notes (Signed)
CRITICAL VALUE ALERT  Critical value received:  4.06  Date of notification:  9:39  Time of notification:  No one from lab called the value in to me  Critical value read back:No.  Nurse who received alert:  Wende NeighborsSara Kelita Wallis, RN  MD notified (1st page):  Dr. Tyson AliasFeinstein  Time of first page:  9:30  MD notified (2nd page):  Time of second page:  Responding MD:  Dr. Tyson AliasFeinstein  Time MD responded:  9:30

## 2014-11-26 NOTE — ED Provider Notes (Signed)
CSN: [id number]     Arrival date & time 11/26/14  4098 History   First MD Initiated Contact with Patient 11/26/14 [phone]     Chief Complaint  Patient presents with  . Fatigue  . Fall     (Consider location/radiation/quality/duration/timing/severity/associated sxs/prior Treatment) HPI Comments: Pt comes in with cc of weakness. She has hx of HTN, DM, CAD, CHF, CKD and recent prolonged admission to the hospital for shock. Pt was just discharged home, and returns to the ER after a fall at home. Pt states that started having loose BM recently, and was going to the bathroom when her legs gave out. She laid on the floor for few hours, and when she didnt get any stronger to get up, she called EMS. Pt has generalized weakness. She denies any significant pain right now - but hasn't ambulated since the fall. She most certainly didn't strike her head, and has no nausea, vomiting, visual complains, loss of consciousness, new weakness, or numbness.  While being transferred to the hospital bed by EMS, patient became unresponsive. She had a left sided gaze and a blank stare with no post ictal phase. Symptoms lasted for a minute. No incontinence. [name] aox 3 soon after. No hx of seizures.   ROS 10 Systems reviewed and are negative for acute change except as noted in the HPI.      Patient is a 69 y.o. female presenting with fall. The history is provided by the patient and medical records.  Fall    Past Medical History  Diagnosis Date  . Hypertension   . Depression   . Diabetes mellitus   . GERD (gastroesophageal reflux disease)   . Obesity   . Coronary artery disease 08/2006    Unstable angina. PCI to distal codominant CFX with 2.5 x 20 Taxus DES...  . CHF (congestive heart failure)   . Sleep apnea     stopped cpap  . Arthritis   . Headache(784.0)   . Hx of echocardiogram     Echo (11/15):  Mild LVH, EF 55-60%, no RWMA, Gr 1 DD, mild AI, mild MR, mod LAE.    Past Surgical History  Procedure  Laterality Date  . Coronary stent placement  08/2006    CAD; stent placed @ [facility]  . Total abdominal hysterectomy    . Cardiac catheterization      x2            mcclean  . Lumbar fusion  02/02/2013  . Back surgery    . Right heart catheterization N/A 06/18/2014    Procedure: RIGHT HEART CATH;  Surgeon: Laurey Morale, MD;  Location: [facility] CATH LAB;  Service: Cardiovascular;  Laterality: N/A;  . Insertion of dialysis catheter Right 11/03/2014    Procedure: INSERTION OF DIALYSIS CATHETER;  Surgeon: Sherren Kerns, MD;  Location: [facility] OR;  Service: Vascular;  Laterality: Right;   Family History  Problem Relation Age of Onset  . Heart attack Father   . Heart failure Father   . Anemia Mother   . Hypertension Mother    History  Substance Use Topics  . Smoking status: Never Smoker   . Smokeless tobacco: Never Used  . Alcohol Use: No   OB History    No data available     Review of Systems  All other systems reviewed and are negative.     Allergies  Review of patient's allergies indicates no known allergies.  Home Medications   Prior to Admission  medications   Medication Sig Start Date End Date Taking? Authorizing Provider  aspirin EC 81 MG tablet Take 1 tablet (81 mg total) by mouth daily. 03/19/13  Yes Laurey Moralealton S McLean, MD  atorvastatin (LIPITOR) 40 MG tablet Take 1 tablet (40 mg total) by mouth daily at 6 PM. 11/25/14  Yes Mcarthur Rossettianiel J Angiulli, PA-C  cephALEXin (KEFLEX) 250 MG capsule Take 1 capsule (250 mg total) by mouth every 8 (eight) hours. 11/25/14  Yes Daniel J Angiulli, PA-C  Cholecalciferol (VITAMIN D3) 1000 UNITS CAPS Take 1 capsule (1,000 Units total) by mouth daily. 11/25/14  Yes Daniel J Angiulli, PA-C  DULoxetine (CYMBALTA) 60 MG capsule Take 1 capsule (60 mg total) by mouth 2 (two) times daily. 11/25/14  Yes Daniel J Angiulli, PA-C  ferrous sulfate 325 (65 FE) MG tablet Take 1 tablet (325 mg total) by mouth 2 (two) times daily. 11/25/14  Yes Daniel J Angiulli, PA-C   glimepiride (AMARYL) 2 MG tablet Take 1 tablet (2 mg total) by mouth daily with breakfast. 11/25/14  Yes Mcarthur Rossettianiel J Angiulli, PA-C  insulin glargine (LANTUS) 100 UNIT/ML injection Inject 0.3 mLs (30 Units total) into the skin at bedtime. 11/25/14  Yes Daniel J Angiulli, PA-C  lidocaine (LIDODERM) 5 % Place 1 patch onto the skin daily. Remove & Discard patch within 12 hours or as directed by MD 11/25/14  Yes Mcarthur Rossettianiel J Angiulli, PA-C  metoprolol tartrate (LOPRESSOR) 25 MG tablet Take 0.5 tablets (12.5 mg total) by mouth 2 (two) times daily. 11/25/14  Yes Daniel J Angiulli, PA-C  Multiple Vitamin (MULTIVITAMIN WITH MINERALS) TABS Take 1 tablet by mouth daily.   Yes Historical Provider, MD  pantoprazole (PROTONIX) 40 MG tablet Take 1 tablet (40 mg total) by mouth daily. 11/25/14  Yes Daniel J Angiulli, PA-C  potassium chloride SA (K-DUR,KLOR-CON) 20 MEQ tablet Take 1 tablet (20 mEq total) by mouth daily. 11/25/14  Yes Daniel J Angiulli, PA-C  rOPINIRole (REQUIP) 2 MG tablet Take 1 tablet (2 mg total) by mouth at bedtime. 11/25/14  Yes Daniel J Angiulli, PA-C  silver sulfADIAZINE (SILVADENE) 1 % cream Apply 1 application topically daily. Wash left foot with soap and water daily. Apply Silvadene with gauze circumferentially around her toes change daily 11/25/14  Yes Daniel J Angiulli, PA-C  sodium bicarbonate 650 MG tablet Take 1 tablet (650 mg total) by mouth 2 (two) times daily. 11/25/14  Yes Daniel J Angiulli, PA-C  sodium chloride (OCEAN) 0.65 % SOLN nasal spray Place 1 spray into both nostrils as needed for congestion. 11/25/14  Yes Daniel J Angiulli, PA-C  torsemide (DEMADEX) 20 MG tablet Take 3 tablets (60 mg total) by mouth daily. 11/25/14  Yes Daniel J Angiulli, PA-C   BP 119/75 mmHg  Pulse 117  Temp(Src) 99.1 F (37.3 C) (Oral)  Resp 26  Ht 5\' 6"  (1.676 m)  Wt 217 lb (98.431 kg)  BMI 35.04 kg/m2  SpO2 94% Physical Exam  Constitutional: [name] oriented to person, place, and time. She appears well-developed.   HENT:  Head: Normocephalic and atraumatic.  Eyes: Conjunctivae and EOM are normal. Pupils are equal, round, and reactive to light.  Neck: Normal range of motion. Neck supple. No JVD present.  Cardiovascular: Normal rate, regular rhythm and normal heart sounds.   Pulmonary/Chest: Effort normal and breath sounds normal. No respiratory distress.  Abdominal: Soft. Bowel sounds are normal. She exhibits no distension. There is no tenderness. There is no rebound and no guarding.  Neurological: [name] alert and  oriented to person, place, and time. No cranial nerve deficit.  Skin: Skin is warm and dry.  Nursing note and vitals reviewed.   ED Course  Procedures (including critical care time) Labs Review Labs Reviewed  CLOSTRIDIUM DIFFICILE BY PCR - Abnormal; Notable for the following:    C difficile by pcr POSITIVE (*)    All other components within normal limits  CBC WITH DIFFERENTIAL/PLATELET - Abnormal; Notable for the following:    WBC 13.8 (*)    RBC 3.41 (*)    Hemoglobin 8.9 (*)    HCT 29.6 (*)    RDW 20.7 (*)    Neutrophils Relative % 90 (*)    Neutro Abs 12.3 (*)    Lymphocytes Relative 5 (*)    All other components within normal limits  COMPREHENSIVE METABOLIC PANEL - Abnormal; Notable for the following:    Potassium 5.6 (*)    Chloride 97 (*)    CO2 20 (*)    Glucose, Bld 325 (*)    BUN 48 (*)    Creatinine, Ser 3.00 (*)    Calcium 8.7 (*)    Total Protein 6.3 (*)    Albumin 2.6 (*)    GFR calc non Af Amer 15 (*)    GFR calc Af Amer 18 (*)    Anion gap 18 (*)    All other components within normal limits  URINALYSIS, ROUTINE W REFLEX MICROSCOPIC - Abnormal; Notable for the following:    APPearance CLOUDY (*)    Leukocytes, UA MODERATE (*)    All other components within normal limits  CK - Abnormal; Notable for the following:    Total CK 15 (*)    All other components within normal limits  TROPONIN I - Abnormal; Notable for the following:    Troponin I 0.74 (*)     All other components within normal limits  URINE MICROSCOPIC-ADD ON - Abnormal; Notable for the following:    Squamous Epithelial / LPF FEW (*)    Bacteria, UA MANY (*)    All other components within normal limits  GLUCOSE, CAPILLARY - Abnormal; Notable for the following:    Glucose-Capillary 314 (*)    All other components within normal limits  CBG MONITORING, ED - Abnormal; Notable for the following:    Glucose-Capillary 297 (*)    All other components within normal limits  I-STAT CG4 LACTIC ACID, ED - Abnormal; Notable for the following:    Lactic Acid, Venous 8.35 (*)    All other components within normal limits  I-STAT VENOUS BLOOD GAS, ED - Abnormal; Notable for the following:    pH, Ven 7.315 (*)    pCO2, Ven 38.1 (*)    Bicarbonate 19.4 (*)    Acid-base deficit 6.0 (*)    All other components within normal limits  I-STAT CG4 LACTIC ACID, ED - Abnormal; Notable for the following:    Lactic Acid, Venous 2.97 (*)    All other components within normal limits  I-STAT VENOUS BLOOD GAS, ED - Abnormal; Notable for the following:    pCO2, Ven 41.7 (*)    Acid-base deficit 6.0 (*)    All other components within normal limits  CULTURE, BLOOD (ROUTINE X 2)  CULTURE, BLOOD (ROUTINE X 2)  URINE CULTURE  MRSA PCR SCREENING  APTT  PROTIME-INR  LACTIC ACID, PLASMA  TROPONIN I  TROPONIN I  CORTISOL  PROTIME-INR  PROCALCITONIN  TROPONIN I  CBC  BASIC METABOLIC PANEL  MAGNESIUM  PHOSPHORUS  Imaging Review Dg Chest Port 1 View  11/26/2014   CLINICAL DATA:  69 year old female with sepsis and shortness of Breath. Initial encounter.  EXAM: PORTABLE CHEST - 1 VIEW  COMPARISON:  11/07/2014 and earlier.  FINDINGS: Portable AP view at levin 32 hours. Right IJ approach dual lumen catheter is been removed and there is now a right IJ single lumen catheter in place. Tip projects just above the carina at the SVC level. Stable cardiomegaly and mediastinal contours. Increased vascular  congestion without overt edema. No pneumothorax or pleural effusion identified. No consolidation identified. Visualized tracheal air column is within normal limits.  IMPRESSION: 1. Right IJ single lumen catheter now in place with removed previous tunneled type dual lumen catheter. Tip at the SVC level with no pneumothorax. 2. Pulmonary vascular congestion without overt edema. 3. Otherwise stable portable chest.   Electronically Signed   By: Odessa Fleming M.D.   On: 11/26/2014 12:05   Dg Toe Great Left  11/26/2014   CLINICAL DATA:  Sepsis, shortness of breath, and fatigue. Burn involving the great toe. Evaluation for infection.  EXAM: LEFT GREAT TOE  COMPARISON:  10/23/2014 left foot radiographs  FINDINGS: An old, healed fracture of the distal second metatarsal shaft is again seen. Mild-to-moderate degenerative changes are noted involving the first MTP joint and great toe IP joint. No acute fracture, dislocation, or osteolysis is identified. No soft tissue emphysema is seen. Soft tissue swelling is again seen rather diffusely in the forefoot. Vascular calcifications are noted.  IMPRESSION: No acute osseous abnormality or radiographic evidence of active osteomyelitis involving the left great toe.   Electronically Signed   By: Sebastian Ache   On: 11/26/2014 12:08     EKG Interpretation None      MDM   Final diagnoses:  Septic shock  Shock circulatory    Pt comes in with cc of weakness. [name] hypotensive, but alert and oriented at arrival. She had an ABSENT seizure like episode here. She has no headaches, signs of head trauma. She has no chest pain, abd pain, dib, cough, uti like sx. [name] noted to have a fever, has a diarrhea and L great toe is erythematous. Pt had a prolonged admission to icu recently. She has respiratory and renal failure, improved overtime, and was discharged.  Currently tachycardic, hypotensive, febrile. Pt given 2 literis IVF bolus. [name] still hypotensive. Central line placed,  norepi started- MAP 65. Vanc and zosyn initiated.  Pt's cdiff is +. Lactate is 8. She was given iv flagyl.  Levo drip at 20 mcg/min. CCM admitting.  Pt has CHF - but she received 30cc/kg - 2 liters, and getting maintenance fluid. She was started on cpap due to increased work of breathing.  CRITICAL CARE Performed by: Derwood Kaplan   Total critical care time: 45 minutes  Critical care time was exclusive of separately billable procedures and treating other patients.  Critical care was necessary to treat or prevent imminent or life-threatening deterioration.  Critical care was time spent personally by me on the following activities: development of treatment plan with patient and/or surrogate as well as nursing, discussions with consultants, evaluation of patient's response to treatment, examination of patient, obtaining history from patient or surrogate, ordering and performing treatments and interventions, ordering and review of laboratory studies, ordering and review of radiographic studies, pulse oximetry and re-evaluation of patient's condition.  CENTRAL LINE Performed by: Derwood Kaplan Consent: The procedure was performed in an emergent situation. Required items: required  blood products, implants, devices, and special equipment available Patient identity confirmed: arm band and provided demographic data Time out: Immediately prior to procedure a "time out" was called to verify the correct patient, procedure, equipment, support staff and site/side marked as required. Indications: vascular access Anesthesia: local infiltration Local anesthetic: lidocaine 1% with epinephrine Anesthetic total: 3 ml Patient sedated: no Preparation: skin prepped with 2% chlorhexidine Skin prep agent dried: skin prep agent completely dried prior to procedure Sterile barriers: all five maximum sterile barriers used - cap, mask, sterile gown, sterile gloves, and large sterile sheet Hand hygiene: hand  hygiene performed prior to central venous catheter insertion  Location details: Right IJ  Catheter type: triple lumen Catheter size: 8 Fr Pre-procedure: landmarks identified Ultrasound guidance: YES Successful placement: yes Post-procedure: line sutured and dressing applied Assessment: blood return through all parts, free fluid flow, placement verified by x-ray and no pneumothorax on x-ray Patient tolerance: Patient tolerated the procedure well with no immediate complications.     Derwood KaplanAnkit Seraphina Mitchner, MD 11/26/14 1733

## 2014-11-26 NOTE — ED Notes (Signed)
Dr. Rhunette CroftNanavati at the bedside to perform central line placement.

## 2014-11-26 NOTE — Consult Note (Cosign Needed)
RFC: IVCD, NSTEMI  HPI: [name] with hx of HTN, DM2, CAD (PCI to LCx in 2008), HFrEF (LVEF ~40 on recent nuke), CKD, admitted with septic shock in setting of positive c diff.  Patient has responded to aggressive fluid resuscitation and levophed but currently with AKI and rising troponin.  Called regarding newly widened QRS and fast heart rhythm.  Review of ECG from this morning shows sinus tach with prolonged PR and LBBB by Krista Wise criteria.  This corresponds well with her telemetry that shows transition of LBBB to narrow QRS with slowing HR.    Patient denies any chest discomfort or shortness of breath this morning.  Currently feels much better and is breathing comfortably (though on BiPAP for OSA).  REVIEW OF SYSTEMS:  Bolds are positive  Constitutional: weight loss, gain, night sweats, Fevers, chills, fatigue .  HEENT: headaches, Sore throat, sneezing, nasal congestion, post nasal drip, Difficulty swallowing, Tooth/dental problems, visual complaints visual changes, ear ache CV: chest pain, radiates: ,Orthopnea, PND, swelling in lower extremities, dizziness, palpitations, syncope.  GI heartburn, indigestion, abdominal pain, nausea, vomiting, diarrhea, change in bowel habits, loss of appetite, bloody stools.  Resp: cough, productive: , hemoptysis, dyspnea, chest pain, pleuritic.  Skin: rash or itching or icterus GU: dysuria, change in color of urine, urgency or frequency. flank pain, hematuria  MS: joint pain or swelling. decreased range of motion  Psych: change in mood or affect. depression or anxiety.  Neuro: difficulty with speech, weakness, numbness, ataxia     Past Medical History  Diagnosis Date  . Hypertension   . Depression   . Diabetes mellitus   . GERD (gastroesophageal reflux disease)   . Obesity   . Coronary artery disease 08/2006    Unstable angina. PCI to distal codominant CFX with 2.5 x 20 Taxus DES...  . CHF (congestive heart failure)   . Sleep apnea      stopped cpap  . Arthritis   . Headache(784.0)   . Hx of echocardiogram     Echo (11/15):  Mild LVH, EF 55-60%, no RWMA, Gr 1 DD, mild AI, mild MR, mod LAE.     Medications Scheduled Meds: . [START ON 11/27/2014] antiseptic oral rinse  7 mL Mouth Rinse q12n4p  . aspirin EC  81 mg Oral Daily  . atorvastatin  40 mg Oral q1800  . chlorhexidine  15 mL Mouth Rinse BID  . clopidogrel  300 mg Oral Once  . [START ON 11/27/2014] clopidogrel  75 mg Oral Daily  . insulin aspart  0-15 Units Subcutaneous 6 times per day  . metronidazole  500 mg Intravenous Q8H  . pantoprazole (PROTONIX) IV  40 mg Intravenous QHS  . piperacillin-tazobactam (ZOSYN)  IV  3.375 g Intravenous Q8H  . sodium polystyrene  15 g Oral Once  . vancomycin  125 mg Oral QID   Continuous Infusions: . sodium chloride    . sodium chloride 125 mL/hr at 11/26/14 1937  . heparin 3,000 Units/hr (11/26/14 2201)  . norepinephrine (LEVOPHED) Adult infusion 5 mcg/min (11/26/14 2300)   PRN Meds:.sodium chloride  Family History  Problem Relation Age of Onset  . Heart attack Father   . Heart failure Father   . Anemia Mother   . Hypertension Mother    Soc Hx: History   Social History  . Marital Status: Married    Spouse Name: Krista Wise  . Number of Children: N/A  . Years of Education: N/A   Occupational History  . 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 Smoking status: Never Smoker   . Smokeless tobacco: Never Used  . Alcohol Use: No  . Drug Use: No  . Sexual Activity: Not Currently   Other Topics Concern  . Not on file   Social History Narrative   Married, [place]   Disabled: diabetes and fibromyalgia   No regular exercise            Exam BP 114/69 mmHg  Pulse 101  Temp(Src) 99.2 F (37.3 C) (Oral)  Resp 20  Ht 5\' 6"  (1.676 m)  Wt 98.431 kg (217 lb)  BMI 35.04 kg/m2  SpO2 96% [name] on BiPap, comfortable CVP is 11 Tachycardic, s1 s2,  regular, no murmur Clear anteiorly, bipap sounds Soft nt obese Warm Well perfused AAO without gross focal deficit  I/O - 400 cc (still making urine)  Labs: 13.8 >----------< 225                 8.9  K 5.6, Cr 3.0 (baseline 2.5), Glucose 325  INR 1.2  Trop 0.74 --> 1.35 --> 4.06  ====  ECG sinus tach narrow, prior ECG sinus tach w 1st degree AV block, LBBB, transitions to narrow on telemetry with decreased HR  A/P  [name] with hx of metabolic syndrome and known CAD admitted with septic shock and found to have NSTEMI and intermittent, likely rate dependent LBBB, possible contribution of electrolyte abnormalities.  1. NSTEMI: likely demand mediated, but rapid rise in troponin is worth treating for plaque rupture - c/w atorvastatin 40 - c/w ASA - c/w heparin - would load plavix now for optimal medical treatment - no LHC while asymptomatic and with Cr of 3 - would echo in am to check for focal WMA - would wean norepinephrine to off, keep to maintain MAP > 65  2.  Intermittent LBBB - monitor K - unlikely to be AIVR as rate varies on telemetry, with clear p-waves  Krista ArbourEdward Kim Oki, MD Cardiology Fellow

## 2014-11-26 NOTE — Progress Notes (Signed)
Lactic acid and other current lab results called to Dr [place]in [place] .

## 2014-11-26 NOTE — ED Notes (Signed)
Per EMS, pt coming in today for generalized weakness and fall at 400 this morning. Pt d/c from this hospital yesterday after extended stay for sepsis. Pts intial BP for EMS was 78/38 which improved to 86/42 after 100ml of NS. Pts CBG: 384. Pt alert to self, situation and place but disoriented to time. After getting on our stretcher pt became unresponsive and had a left gaze. Dr Rhunette CroftNanavati called to the bedside. Pt remained unresponsive with a gag reflex for 3 minutes and then started to respond to voice.

## 2014-11-27 ENCOUNTER — Inpatient Hospital Stay (HOSPITAL_COMMUNITY): Payer: Medicare Other

## 2014-11-27 ENCOUNTER — Other Ambulatory Visit (HOSPITAL_COMMUNITY): Payer: Medicare Other

## 2014-11-27 DIAGNOSIS — I509 Heart failure, unspecified: Secondary | ICD-10-CM

## 2014-11-27 DIAGNOSIS — N189 Chronic kidney disease, unspecified: Secondary | ICD-10-CM

## 2014-11-27 DIAGNOSIS — I5032 Chronic diastolic (congestive) heart failure: Secondary | ICD-10-CM

## 2014-11-27 DIAGNOSIS — I2510 Atherosclerotic heart disease of native coronary artery without angina pectoris: Secondary | ICD-10-CM | POA: Diagnosis present

## 2014-11-27 DIAGNOSIS — N179 Acute kidney failure, unspecified: Secondary | ICD-10-CM

## 2014-11-27 DIAGNOSIS — I255 Ischemic cardiomyopathy: Secondary | ICD-10-CM

## 2014-11-27 LAB — CBC
HCT: 26.9 % — ABNORMAL LOW (ref 36.0–46.0)
Hemoglobin: 8.2 g/dL — ABNORMAL LOW (ref 12.0–15.0)
MCH: 26.6 pg (ref 26.0–34.0)
MCHC: 30.5 g/dL (ref 30.0–36.0)
MCV: 87.3 fL (ref 78.0–100.0)
PLATELETS: 211 10*3/uL (ref 150–400)
RBC: 3.08 MIL/uL — ABNORMAL LOW (ref 3.87–5.11)
RDW: 20.8 % — AB (ref 11.5–15.5)
WBC: 13.7 10*3/uL — AB (ref 4.0–10.5)

## 2014-11-27 LAB — BASIC METABOLIC PANEL
ANION GAP: 10 (ref 5–15)
BUN: 43 mg/dL — ABNORMAL HIGH (ref 6–20)
CO2: 22 mmol/L (ref 22–32)
Calcium: 8.1 mg/dL — ABNORMAL LOW (ref 8.9–10.3)
Chloride: 107 mmol/L (ref 101–111)
Creatinine, Ser: 2.59 mg/dL — ABNORMAL HIGH (ref 0.44–1.00)
GFR calc non Af Amer: 18 mL/min — ABNORMAL LOW (ref >60)
GFR, EST AFRICAN AMERICAN: 21 mL/min — AB (ref >60)
Glucose, Bld: 164 mg/dL — ABNORMAL HIGH (ref 70–99)
Potassium: 5.2 mmol/L — ABNORMAL HIGH (ref 3.5–5.1)
SODIUM: 139 mmol/L (ref 135–145)

## 2014-11-27 LAB — TROPONIN I: Troponin I: 7.29 ng/mL (ref <0.031)

## 2014-11-27 LAB — HEPARIN LEVEL (UNFRACTIONATED)
HEPARIN UNFRACTIONATED: 0.44 [IU]/mL (ref 0.30–0.70)
Heparin Unfractionated: 0.23 IU/mL — ABNORMAL LOW (ref 0.30–0.70)
Heparin Unfractionated: 0.33 IU/mL (ref 0.30–0.70)

## 2014-11-27 LAB — GLUCOSE, CAPILLARY
GLUCOSE-CAPILLARY: 158 mg/dL — AB (ref 70–99)
Glucose-Capillary: 155 mg/dL — ABNORMAL HIGH (ref 70–99)
Glucose-Capillary: 130 mg/dL — ABNORMAL HIGH (ref 70–99)
Glucose-Capillary: 180 mg/dL — ABNORMAL HIGH (ref 70–99)

## 2014-11-27 LAB — PHOSPHORUS: Phosphorus: 4.2 mg/dL (ref 2.5–4.6)

## 2014-11-27 LAB — MAGNESIUM: Magnesium: 1.5 mg/dL — ABNORMAL LOW (ref 1.7–2.4)

## 2014-11-27 MED ORDER — FAMOTIDINE 20 MG PO TABS
20.0000 mg | ORAL_TABLET | Freq: Every day | ORAL | Status: DC
  Administered 2014-11-27 – 2014-12-05 (×9): 20 mg via ORAL
  Filled 2014-11-27 (×10): qty 1

## 2014-11-27 MED ORDER — MAGNESIUM SULFATE IN D5W 10-5 MG/ML-% IV SOLN
1.0000 g | Freq: Once | INTRAVENOUS | Status: AC
  Administered 2014-11-27: 1 g via INTRAVENOUS
  Filled 2014-11-27: qty 100

## 2014-11-27 MED ORDER — MUPIROCIN CALCIUM 2 % EX CREA
TOPICAL_CREAM | Freq: Every day | CUTANEOUS | Status: DC
  Administered 2014-11-27: 1 via TOPICAL
  Administered 2014-11-28 – 2014-12-01 (×4): via TOPICAL
  Filled 2014-11-27 (×2): qty 15

## 2014-11-27 MED ORDER — ROPINIROLE HCL 1 MG PO TABS
2.0000 mg | ORAL_TABLET | Freq: Every day | ORAL | Status: DC
  Administered 2014-11-27 – 2014-12-05 (×9): 2 mg via ORAL
  Filled 2014-11-27 (×11): qty 2

## 2014-11-27 MED ORDER — INSULIN ASPART 100 UNIT/ML ~~LOC~~ SOLN
0.0000 [IU] | Freq: Three times a day (TID) | SUBCUTANEOUS | Status: DC
  Administered 2014-11-27 – 2014-11-28 (×5): 3 [IU] via SUBCUTANEOUS
  Administered 2014-11-28: 5 [IU] via SUBCUTANEOUS
  Administered 2014-11-29: 2 [IU] via SUBCUTANEOUS
  Administered 2014-11-29: 4 [IU] via SUBCUTANEOUS
  Administered 2014-11-29: 5 [IU] via SUBCUTANEOUS
  Administered 2014-11-30 (×2): 2 [IU] via SUBCUTANEOUS
  Administered 2014-11-30: 3 [IU] via SUBCUTANEOUS
  Administered 2014-12-01 (×2): 2 [IU] via SUBCUTANEOUS
  Administered 2014-12-02: 3 [IU] via SUBCUTANEOUS
  Administered 2014-12-02: 2 [IU] via SUBCUTANEOUS
  Administered 2014-12-02 – 2014-12-03 (×3): 3 [IU] via SUBCUTANEOUS
  Administered 2014-12-03: 2 [IU] via SUBCUTANEOUS
  Administered 2014-12-04: 3 [IU] via SUBCUTANEOUS
  Administered 2014-12-04: 2 [IU] via SUBCUTANEOUS
  Administered 2014-12-04 – 2014-12-05 (×3): 3 [IU] via SUBCUTANEOUS
  Administered 2014-12-05: 2 [IU] via SUBCUTANEOUS
  Administered 2014-12-06: 3 [IU] via SUBCUTANEOUS

## 2014-11-27 MED ORDER — DULOXETINE HCL 60 MG PO CPEP
60.0000 mg | ORAL_CAPSULE | Freq: Two times a day (BID) | ORAL | Status: DC
  Administered 2014-11-27 – 2014-12-06 (×19): 60 mg via ORAL
  Filled 2014-11-27 (×20): qty 1

## 2014-11-27 MED ORDER — HEPARIN BOLUS VIA INFUSION
2000.0000 [IU] | Freq: Once | INTRAVENOUS | Status: AC
  Administered 2014-11-27: 2000 [IU] via INTRAVENOUS
  Filled 2014-11-27: qty 2000
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
## 2014-11-27 NOTE — Progress Notes (Signed)
ANTICOAGULATION CONSULT NOTE  Pharmacy Consult for Heparin Indication: chest pain/ACS  No Known Allergies  Patient Measurements: Height: 5\' 6"  (167.6 cm) Weight: 231 lb 4.2 oz (104.9 kg) IBW/kg (Calculated) : 59.3 Heparin Dosing Weight: 83 kg  Vital Signs: Temp: 97.4 F (36.3 C) (05/05 2009) Temp Source: Oral (05/05 2009) BP: 108/76 mmHg (05/05 1930) Pulse Rate: 101 (05/05 1930)  Labs:  Recent Labs  11/26/14 1000 11/26/14 1433 11/26/14 1630 11/26/14 1945 11/27/14 0218 11/27/14 0226 11/27/14 0302 11/27/14 1105 11/27/14 1840  HGB 8.9*  --   --   --   --   --  8.2*  --   --   HCT 29.6*  --   --   --   --   --  26.9*  --   --   PLT 225  --   --   --   --   --  211  --   --   APTT 28  --   --   --   --   --   --   --   --   LABPROT 14.7  --  15.3*  --   --   --   --   --   --   INR 1.13  --  1.20  --   --   --   --   --   --   HEPARINUNFRC  --   --   --   --  0.23*  --   --  0.44 0.33  CREATININE 3.00*  --   --   --   --   --  2.59*  --   --   CKTOTAL 15*  --   --   --   --   --   --   --   --   TROPONINI 0.74* 1.35*  --  4.06*  --  7.29*  --   --   --     Estimated Creatinine Clearance: 26.1 mL/min (by C-G formula based on Cr of 2.59).  Assessment: 69 y.o. female with NSTEMI on heparin and heparin level confirmed at goal (HL= 0.33).  Goal of Therapy:  Heparin level 0.3-0.7 units/ml Monitor platelets by anticoagulation protocol: Yes   Plan:  -No heparin changes needed -Daily heparin level and CBC  Harland GermanAndrew Maggie Senseney, Pharm D 11/27/2014 8:19 PM
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
## 2014-11-27 NOTE — Progress Notes (Signed)
CRITICAL VALUE ALERT  Critical value received:  Troponin 7.29  Date of notification:  11/27/14  Time of notification:  No one from lab called  Critical value read back:  Nurse who received alert:  Wende NeighborsSara Rik Wadel, RN  MD notified (1st page):  Deatra CanterMark, E-link nurse, to notify E-link physician  Time of first page:  3:41  MD notified (2nd page):  Time of second page:  Responding MD:  Dr. Sung AmabileSimonds  Time MD responded: 3:41
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
## 2014-11-27 NOTE — Progress Notes (Signed)
  Echocardiogram 2D Echocardiogram limited has been performed.  Arvil ChacoFoster, Carol Loftin 11/27/2014, 4:51 PM

## 2014-11-27 NOTE — Care Management Note (Signed)
Case Management Note  Patient Details  Name: Krista Wise MRN: [id number] Date of Birth: [birth year]  Subjective/Objective:                  Home from rehab since 5-3.  Weak and fell at home - found to have C dif.  Lives with husband.  May need higher level of care of discharge.  Ltach.  CM will continue to follow.   Action/Plan:   Expected Discharge Date:  12/04/14               Expected Discharge Plan:  Long Term Acute Care (LTAC)  In-House Referral:     Discharge planning Services     Post Acute Care Choice:    Choice offered to:     DME Arranged:    DME Agency:     HH Arranged:    HH Agency:     Status of Service:  In process, will continue to follow  Medicare Important Message Given:    Date Medicare IM Given:    Medicare IM give by:    Date Additional Medicare IM Given:    Additional Medicare Important Message give by:     If discussed at Long Length of Stay Meetings, dates discussed:    Additional Comments:  Vangie BickerBrown, Kymiah Araiza Jane, RN 11/27/2014, 4:00 AM

## 2014-11-27 NOTE — Progress Notes (Addendum)
ANTICOAGULATION CONSULT NOTE  Pharmacy Consult for Heparin Indication: chest pain/ACS  No Known Allergies  Patient Measurements: Height: 5\' 6"  (167.6 cm) Weight: 217 lb (98.431 kg) IBW/kg (Calculated) : 59.3 Heparin Dosing Weight: 83 kg  Vital Signs: Temp: 99.4 F (37.4 C) (05/05 0000) Temp Source: Oral (05/05 0000) BP: 90/57 mmHg (05/05 0200) Pulse Rate: 94 (05/05 0200)  Labs:  Recent Labs  11/26/14 1000 11/26/14 1433 11/26/14 1630 11/26/14 1945 11/27/14 0218  HGB 8.9*  --   --   --   --   HCT 29.6*  --   --   --   --   PLT 225  --   --   --   --   APTT 28  --   --   --   --   LABPROT 14.7  --  15.3*  --   --   INR 1.13  --  1.20  --   --   HEPARINUNFRC  --   --   --   --  0.23*  CREATININE 3.00*  --   --   --   --   CKTOTAL 15*  --   --   --   --   TROPONINI 0.74* 1.35*  --  4.06*  --     Estimated Creatinine Clearance: 21.8 mL/min (by C-G formula based on Cr of 3).  Assessment: 69 y.o. female with chest pain for heparin   Goal of Therapy:  Heparin level 0.3-0.7 units/ml Monitor platelets by anticoagulation protocol: Yes   Plan:  Heparin 2000 units IV bolus, then increase heparin 1350 units.hr Check heparin level in 8 hours.   Eddie Candlebbott, Gregory Vernon 11/27/2014,3:09 AM   Addendum:  Repeat heparin level at goal at 0.44 on 1350 units/hr after bolus.  CBC low-stable. No bleeding noted.   Plan: Continue heparin at 1350 units/hr.  Follow-up repeat level in 8 hours to confirm.   Link SnufferJessica Kenley Rettinger, PharmD, BCPS Clinical Pharmacist [phone] 11/27/2014, 12:02 PM

## 2014-11-28 ENCOUNTER — Telehealth: Payer: Self-pay | Admitting: *Deleted

## 2014-11-28 ENCOUNTER — Inpatient Hospital Stay (HOSPITAL_COMMUNITY): Payer: Medicare Other

## 2014-11-28 DIAGNOSIS — I25110 Atherosclerotic heart disease of native coronary artery with unstable angina pectoris: Secondary | ICD-10-CM

## 2014-11-28 DIAGNOSIS — I214 Non-ST elevation (NSTEMI) myocardial infarction: Secondary | ICD-10-CM

## 2014-11-28 DIAGNOSIS — I5043 Acute on chronic combined systolic (congestive) and diastolic (congestive) heart failure: Secondary | ICD-10-CM

## 2014-11-28 DIAGNOSIS — I270 Primary pulmonary hypertension: Secondary | ICD-10-CM

## 2014-11-28 LAB — URINE CULTURE

## 2014-11-28 LAB — BASIC METABOLIC PANEL
Anion gap: 11 (ref 5–15)
BUN: 43 mg/dL — ABNORMAL HIGH (ref 6–20)
CO2: 20 mmol/L — ABNORMAL LOW (ref 22–32)
Calcium: 8.1 mg/dL — ABNORMAL LOW (ref 8.9–10.3)
Chloride: 104 mmol/L (ref 101–111)
Creatinine, Ser: 2.53 mg/dL — ABNORMAL HIGH (ref 0.44–1.00)
GFR calc Af Amer: 22 mL/min — ABNORMAL LOW (ref >60)
GFR calc non Af Amer: 19 mL/min — ABNORMAL LOW (ref >60)
GLUCOSE: 193 mg/dL — AB (ref 70–99)
Potassium: 4.8 mmol/L (ref 3.5–5.1)
SODIUM: 135 mmol/L (ref 135–145)

## 2014-11-28 LAB — CBC
HCT: 25.5 % — ABNORMAL LOW (ref 36.0–46.0)
HEMOGLOBIN: 7.7 g/dL — AB (ref 12.0–15.0)
MCH: 26.1 pg (ref 26.0–34.0)
MCHC: 30.2 g/dL (ref 30.0–36.0)
MCV: 86.4 fL (ref 78.0–100.0)
Platelets: 246 10*3/uL (ref 150–400)
RBC: 2.95 MIL/uL — AB (ref 3.87–5.11)
RDW: 21 % — ABNORMAL HIGH (ref 11.5–15.5)
WBC: 13.8 10*3/uL — AB (ref 4.0–10.5)

## 2014-11-28 LAB — TROPONIN I: TROPONIN I: 1.58 ng/mL — AB (ref <0.031)

## 2014-11-28 LAB — HEPARIN LEVEL (UNFRACTIONATED): HEPARIN UNFRACTIONATED: 0.44 [IU]/mL (ref 0.30–0.70)

## 2014-11-28 LAB — GLUCOSE, CAPILLARY
GLUCOSE-CAPILLARY: 185 mg/dL — AB (ref 70–99)
GLUCOSE-CAPILLARY: 127 mg/dL — AB (ref 70–99)
Glucose-Capillary: 195 mg/dL — ABNORMAL HIGH (ref 70–99)
Glucose-Capillary: 214 mg/dL — ABNORMAL HIGH (ref 70–99)
Glucose-Capillary: 175 mg/dL — ABNORMAL HIGH (ref 70–99)

## 2014-11-28 LAB — LACTIC ACID, PLASMA: LACTIC ACID, VENOUS: 1.4 mmol/L (ref 0.5–2.0)

## 2014-11-28 MED ORDER — HEPARIN SODIUM (PORCINE) 1000 UNIT/ML DIALYSIS
1000.0000 [IU] | INTRAMUSCULAR | Status: DC | PRN
  Administered 2014-11-28: 1000 [IU] via INTRAVENOUS_CENTRAL
  Filled 2014-11-28: qty 4
  Filled 2014-11-28: qty 1
  Filled 2014-11-28 (×2): qty 6
  Filled 2014-11-28: qty 1

## 2014-11-28 MED ORDER — LEVOFLOXACIN IN D5W 750 MG/150ML IV SOLN
750.0000 mg | INTRAVENOUS | Status: DC
  Administered 2014-11-28 – 2014-11-30 (×2): 750 mg via INTRAVENOUS
  Filled 2014-11-28 (×2): qty 150

## 2014-11-28 MED ORDER — FUROSEMIDE 10 MG/ML IJ SOLN
160.0000 mg | Freq: Once | INTRAVENOUS | Status: AC
  Administered 2014-11-28: 160 mg via INTRAVENOUS
  Filled 2014-11-28: qty 16

## 2014-11-28 MED ORDER — FUROSEMIDE 10 MG/ML IJ SOLN: 80.0000 mg | Freq: Two times a day (BID) | INTRAMUSCULAR | Status: DC

## 2014-11-28 MED ORDER — FUROSEMIDE 10 MG/ML IJ SOLN
160.0000 mg | Freq: Two times a day (BID) | INTRAVENOUS | Status: DC
  Administered 2014-11-28: 160 mg via INTRAVENOUS
  Filled 2014-11-28 (×2): qty 16

## 2014-11-28 MED ORDER — FUROSEMIDE 10 MG/ML IJ SOLN
40.0000 mg | Freq: Once | INTRAMUSCULAR | Status: AC
  Administered 2014-11-28: 40 mg via INTRAVENOUS
  Filled 2014-11-28: qty 4

## 2014-11-28 MED ORDER — VANCOMYCIN 50 MG/ML ORAL SOLUTION
125.0000 mg | Freq: Four times a day (QID) | ORAL | Status: DC
  Administered 2014-11-28 – 2014-12-06 (×32): 125 mg via ORAL
  Filled 2014-11-28 (×36): qty 2.5

## 2014-11-28 NOTE — Clinical Documentation Improvement (Signed)
  Supporting Information:  History of CHF  Current Diagnoses:  Acute NSTEMI, most likely due to demand ischemia; suspect residual inferolateral viable myocardium seen on nuclear scan has infarcted. Septic shock 2nd to HCAP, UTI, and C diff. Elevated troponin >> likely from demand ischemia.Hx of CAD, HTN. P: Fluids for goal CVP 8 to 12  Pressors for goal MAP > 65 Holding outpatient metoprolol, demadex ASA, lipitor, plavix Heparin gtt per pharmacy   5/6 progress note: LV dysfunction/combined systolic and diastolic HF - LVEF 50% by echo, 42% by QGS. Watch for fluid overload. Moderate pulmonary HTN - multifactorial (LV failure, OSA/obesity-hypoventilation sd.) - reevaluate by echo RHC (11/15) with mean RA 8, PA 53/24 mean 36, mean PCWP 19, CI 4.23, PVR 1.9 WU. - values suggest intrinsic pulmonary vascular disease in addition to HF.  11/26/14 1000 11/26/14 1433 11/26/14 1945 11/27/14 0226   CKTOTAL 15* --  --  --   TROPONINI 0.74* 1.35* 4.06* 7.29*         5/5 Echo: EF 30% 5/4 CXR: IMPRESSION: 1. Right IJ single lumen catheter now in place with removed previous tunneled type dual lumen catheter. Tip at the SVC level with no pneumothorax. 2. Pulmonary vascular congestion without overt edema. 3. Otherwise stable portable chest.  Treatments: Daily Weights Strict I&O Cardiac Echo Watching for fluid overload  After study, could the acuity of the combined systolic and diastolic HF be further specified in the MD progress notes and discharge summary?  Acute combined systolic and diastolic HF Acute on Chronic systolic and diastolic HF Chronic systolic and diastolic HF Other Unable to determine  Thank You,  Bandon Sherwin T. Luiz OchoaWilliams RN, MSN, MBA/MHA Clinical Documentation Specialist [email or web address] Office # [phone]
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
## 2014-11-28 NOTE — Progress Notes (Signed)
Patient did not initially want to wear bi-pap tonight.  Patient was education, and finally asked for bi-pap around 4AM.

## 2014-11-28 NOTE — Telephone Encounter (Signed)
Recd approval on prior authorization for the patients lidocaine patches - auth good from May 6, 16 thru 05/27/15.

## 2014-11-28 NOTE — Progress Notes (Signed)
Pt. Asked to go on bipap due to experiencing some distress. Pt. Tolerating bipap at this time.

## 2014-11-28 NOTE — Progress Notes (Addendum)
ANTICOAGULATION/ANTIBIOTIC CONSULT NOTE  Pharmacy Consult for Heparin and Levaquin Indication: chest pain/ACS and enterobacter UTI  No Known Allergies  Patient Measurements: Height: 5\' 6"  (167.6 cm) Weight: 231 lb 4.2 oz (104.9 kg) IBW/kg (Calculated) : 59.3 Heparin Dosing Weight: 83 kg  Vital Signs: Temp: 97.9 F (36.6 C) (05/06 0730) Temp Source: Axillary (05/06 0730) BP: 111/68 mmHg (05/06 0630) Pulse Rate: 93 (05/06 0630)  Labs:  Recent Labs  11/26/14 1000 11/26/14 1433 11/26/14 1630 11/26/14 1945  11/27/14 0226 11/27/14 0302 11/27/14 1105 11/27/14 1840 11/28/14 0426  HGB 8.9*  --   --   --   --   --  8.2*  --   --  7.7*  HCT 29.6*  --   --   --   --   --  26.9*  --   --  25.5*  PLT 225  --   --   --   --   --  211  --   --  246  APTT 28  --   --   --   --   --   --   --   --   --   LABPROT 14.7  --  15.3*  --   --   --   --   --   --   --   INR 1.13  --  1.20  --   --   --   --   --   --   --   HEPARINUNFRC  --   --   --   --   < >  --   --  0.44 0.33 0.44  CREATININE 3.00*  --   --   --   --   --  2.59*  --   --  2.53*  CKTOTAL 15*  --   --   --   --   --   --   --   --   --   TROPONINI 0.74* 1.35*  --  4.06*  --  7.29*  --   --   --   --   < > = values in this interval not displayed.  Estimated Creatinine Clearance: 26.8 mL/min (by C-G formula based on Cr of 2.53).   Microbiology: Recent Results (from the past 720 hour(s))  Culture, Urine     Status: None   Collection Time: 11/23/14  4:07 AM  Result Value Ref Range Status   Specimen Description URINE, CLEAN CATCH  Final   Special Requests NONE  Final   Colony Count   Final    >=100,000 COLONIES/ML Performed at [facility]    Culture   Final    ENTEROBACTER CLOACAE KLEBSIELLA PNEUMONIAE Performed at [facility]    Report Status 11/26/2014 FINAL  Final   Organism ID, Bacteria ENTEROBACTER CLOACAE  Final   Organism ID, Bacteria KLEBSIELLA PNEUMONIAE  Final      Susceptibility   Enterobacter cloacae - MIC*    CEFAZOLIN >=64 RESISTANT Resistant     CEFTRIAXONE <=1 SENSITIVE Sensitive     CIPROFLOXACIN <=0.25 SENSITIVE Sensitive     GENTAMICIN <=1 SENSITIVE Sensitive     LEVOFLOXACIN <=0.12 SENSITIVE Sensitive     NITROFURANTOIN 32 SENSITIVE Sensitive     TOBRAMYCIN <=1 SENSITIVE Sensitive     TRIMETH/SULFA <=20 SENSITIVE Sensitive     PIP/TAZO <=4 SENSITIVE Sensitive     * ENTEROBACTER CLOACAE   Klebsiella pneumoniae - MIC*    AMPICILLIN >=  32 RESISTANT Resistant     CEFAZOLIN <=4 SENSITIVE Sensitive     CEFTRIAXONE <=1 SENSITIVE Sensitive     CIPROFLOXACIN <=0.25 SENSITIVE Sensitive     GENTAMICIN <=1 SENSITIVE Sensitive     LEVOFLOXACIN <=0.12 SENSITIVE Sensitive     NITROFURANTOIN <=16 SENSITIVE Sensitive     TOBRAMYCIN <=1 SENSITIVE Sensitive     TRIMETH/SULFA <=20 SENSITIVE Sensitive     PIP/TAZO <=4 SENSITIVE Sensitive     * KLEBSIELLA PNEUMONIAE  Blood Culture (routine x 2)     Status: None (Preliminary result)   Collection Time: 11/26/14 10:00 AM  Result Value Ref Range Status   Specimen Description BLOOD ARM LEFT  Final   Special Requests BOTTLES DRAWN AEROBIC AND ANAEROBIC 10CC  Final   Culture   Final           BLOOD CULTURE RECEIVED NO GROWTH TO DATE CULTURE WILL BE HELD FOR 5 DAYS BEFORE ISSUING A FINAL NEGATIVE REPORT Performed at [facility]    Report Status PENDING  Incomplete  Blood Culture (routine x 2)     Status: None (Preliminary result)   Collection Time: 11/26/14 10:25 AM  Result Value Ref Range Status   Specimen Description BLOOD HAND LEFT  Final   Special Requests BOTTLES DRAWN AEROBIC ONLY 3CC  Final   Culture   Final           BLOOD CULTURE RECEIVED NO GROWTH TO DATE CULTURE WILL BE HELD FOR 5 DAYS BEFORE ISSUING A FINAL NEGATIVE REPORT Performed at [facility]    Report Status PENDING  Incomplete  Urine culture     Status: None   Collection Time: 11/26/14 10:36 AM  Result Value Ref Range Status    Specimen Description URINE, CATHETERIZED  Final   Special Requests NONE  Final   Colony Count   Final    >=100,000 COLONIES/ML Performed at [facility]    Culture   Final    ENTEROBACTER CLOACAE Performed at [facility]    Report Status 11/28/2014 FINAL  Final   Organism ID, Bacteria ENTEROBACTER CLOACAE  Final      Susceptibility   Enterobacter cloacae - MIC*    CEFAZOLIN >=64 RESISTANT Resistant     CEFTRIAXONE 32 INTERMEDIATE Intermediate     CIPROFLOXACIN <=0.25 SENSITIVE Sensitive     GENTAMICIN <=1 SENSITIVE Sensitive     LEVOFLOXACIN <=0.12 SENSITIVE Sensitive     NITROFURANTOIN 32 SENSITIVE Sensitive     TOBRAMYCIN <=1 SENSITIVE Sensitive     TRIMETH/SULFA <=20 SENSITIVE Sensitive     PIP/TAZO 64 INTERMEDIATE Intermediate     * ENTEROBACTER CLOACAE  Clostridium Difficile by PCR     Status: Abnormal   Collection Time: 11/26/14 10:36 AM  Result Value Ref Range Status   C difficile by pcr POSITIVE (A) NEGATIVE Final    Comment: CRITICAL RESULT CALLED TO, READ BACK BY AND VERIFIED WITH: BDalbert Garnet. BEASLEY RN 12:50 11/26/14 ([email or web address])   MRSA PCR Screening     Status: None   Collection Time: 11/26/14  4:14 PM  Result Value Ref Range Status   MRSA by PCR NEGATIVE NEGATIVE Final    Comment:        The GeneXpert MRSA Assay (FDA approved for NASAL specimens only), is one component of a comprehensive MRSA colonization surveillance program. It is not intended to diagnose MRSA infection nor to guide or monitor treatment for MRSA infections.     Anti-infectives    Start  Dose/Rate Route Frequency Ordered Stop   11/27/14 1200  vancomycin (VANCOCIN) IVPB 1000 mg/200 mL premix  Status:  Discontinued     1,000 mg 200 mL/hr over 60 Minutes Intravenous Every 24 hours 11/26/14 1106 11/26/14 1513   11/26/14 2200  metroNIDAZOLE (FLAGYL) IVPB 500 mg  Status:  Discontinued     500 mg 100 mL/hr over 60 Minutes Intravenous Every 8 hours 11/26/14 1514 11/26/14 1516    11/26/14 2100  metroNIDAZOLE (FLAGYL) IVPB 500 mg  Status:  Discontinued     500 mg 100 mL/hr over 60 Minutes Intravenous Every 8 hours 11/26/14 1518 11/27/14 0859   11/26/14 1800  piperacillin-tazobactam (ZOSYN) IVPB 3.375 g  Status:  Discontinued     3.375 g 12.5 mL/hr over 240 Minutes Intravenous Every 8 hours 11/26/14 1106 11/28/14 1058   11/26/14 1600  vancomycin (VANCOCIN) 50 mg/mL oral solution 125 mg  Status:  Discontinued     125 mg Oral 4 times daily 11/26/14 1514 11/28/14 1058   11/26/14 1315  metroNIDAZOLE (FLAGYL) IVPB 500 mg     500 mg 100 mL/hr over 60 Minutes Intravenous  Once 11/26/14 1308 11/26/14 1531   11/26/14 1045  piperacillin-tazobactam (ZOSYN) IVPB 3.375 g  Status:  Discontinued     3.375 g 100 mL/hr over 30 Minutes Intravenous  Once 11/26/14 1041 11/26/14 1042   11/26/14 1045  vancomycin (VANCOCIN) IVPB 1000 mg/200 mL premix  Status:  Discontinued     1,000 mg 200 mL/hr over 60 Minutes Intravenous  Once 11/26/14 1041 11/26/14 1044   11/26/14 1045  piperacillin-tazobactam (ZOSYN) IVPB 3.375 g     3.375 g 100 mL/hr over 30 Minutes Intravenous STAT 11/26/14 1042 11/26/14 1119   11/26/14 1045  vancomycin (VANCOCIN) 2,000 mg in sodium chloride 0.9 % 500 mL IVPB     2,000 mg 250 mL/hr over 120 Minutes Intravenous STAT 11/26/14 1044 11/26/14 1351      Assessment: 69 y.o. female readmitted 5/4 from rehab due generalized weakness/fall and continued diarrhea.  Anticoagulation: Pt with NSTEMI on heparin. Heparin level remains therapeutic (0.44). Hgb down to 7.7, plt stable. No bleeding noted.  Infectious Disease: Pt on Vancomycin and Zosyn Day #3 for UTI/HCAP. Now growing MDR enterobacter in urine - narrow abx to Levaquin. Also on po Vanc for cdiff Day #3/14. WBC 13.8. LA 8.35 >>2.97>>5.1. Afeb.  Vanc 3/27>>4/2; 5/4 >>5/6 Flagyl 3/27>>3/29; 5/4 x1 cefepime 3/27>>4/2 po vanc 3/28>>3/29; 5/4 >> 5/18 Zosyn x1 3/27, 5/4>>5/6 levaquin 5/6>>  5/4 BCx2 >>ngtd 5/4 UCx  >>Enterobacter (Sens Cipro, Gent, Levo, Nitro, Tobra, Bactrim; Interm Zosyn, Rocephin; Res to Ancef) 5/4 C diff + 3/29 cdiff: ngF 3/27 urine cx: ngF 3/27 blood cx: ngF 3/28 TA - few candida  Nephrology: baseline SCr 1.5-2, SCr 2.53 (trend down from yesterday), est normalized CrCl 25 ml/min. UOP 0.3 ml/kg/hr  Goal of Therapy:  Heparin level 0.3-0.7 units/ml Monitor platelets by anticoagulation protocol: Yes Resolution of infection   Plan:  -Continue heparin at 1350 units/hr -Daily heparin level and CBC -Levaquin  IV q48h -Will f/u renal function, micro data, pt's clinical condition -  Christoper Fabian, PharmD, BCPS Clinical pharmacist, pager [phone] 11/28/2014 7:48 AM

## 2014-11-28 NOTE — Progress Notes (Signed)
Patient Name: Krista Wise Date of Encounter: 11/28/2014   Primary Cardiologist: Dr. Shirlee Latch   Principal Problem:   Septic shock Active Problems:   Pulmonary HTN   CHF (congestive heart failure)   NSTEMI (non-ST elevated myocardial infarction)   C. difficile colitis   CAD (coronary artery disease)    SUBJECTIVE  More SOB this morning. Denies any CP  CURRENT MEDS . antiseptic oral rinse  7 mL Mouth Rinse q12n4p  . aspirin EC  81 mg Oral Daily  . atorvastatin  40 mg Oral q1800  . chlorhexidine  15 mL Mouth Rinse BID  . clopidogrel  75 mg Oral Daily  . DULoxetine  60 mg Oral BID  . famotidine  20 mg Oral QHS  . insulin aspart  0-15 Units Subcutaneous TID AC & HS  . levofloxacin (LEVAQUIN) IV  750 mg Intravenous Q48H  . mupirocin cream   Topical Daily  . rOPINIRole  2 mg Oral QHS  . vancomycin  125 mg Oral QID    OBJECTIVE  Filed Vitals:   11/28/14 1000 11/28/14 1100 11/28/14 1200 11/28/14 1208  BP: 111/59 100/68 103/63   Pulse: 92 93 92   Temp:    97.8 F (36.6 C)  TempSrc:    Oral  Resp: Height:      Weight:      SpO2: 100% 100% 100%     Intake/Output Summary (Last 24 hours) at 11/28/14 1238 Last data filed at 11/28/14 1200  Gross per 24 hour  Intake 2951.02 ml  Output    665 ml  Net 2286.02 ml   Filed Weights   11/26/14 0957 11/27/14 0500  Weight: 217 lb (98.431 kg) 231 lb 4.2 oz (104.9 kg)    PHYSICAL EXAM  General: Pleasant, NAD. Neuro: Alert and oriented X 3. Moves all extremities spontaneously. Psych: Normal affect. HEENT:  Normal  Neck: Supple without bruits or JVD. R IJ in place. Lungs:  Resp regular and unlabored. Decreased bibasilar breath sound with intermittent rale.  Heart: RRR no s3, s4, or murmurs. Abdomen: Soft, non-tender, non-distended, BS + x 4.  Extremities: No clubbing, cyanosis or edema. DP/PT/Radials 2+ and equal bilaterally.  Accessory Clinical Findings  CBC  Recent Labs  11/26/14 1000 11/27/14 0302  11/28/14 0426  WBC 13.8* 13.7* 13.8*  NEUTROABS 12.3*  --   --   HGB 8.9* 8.2* 7.7*  HCT 29.6* 26.9* 25.5*  MCV 86.8 87.3 86.4  PLT 225 211 246   Basic Metabolic Panel  Recent Labs  11/27/14 0302 11/28/14 0426  NA 139 135  K 5.2* 4.8  CL 107 104  CO2 22 20*  GLUCOSE 164* 193*  BUN 43* 43*  CREATININE 2.59* 2.53*  CALCIUM 8.1* 8.1*  MG 1.5*  --   PHOS 4.2  --    Liver Function Tests  Recent Labs  11/26/14 1000  AST 23  ALT 14  ALKPHOS 82  BILITOT 0.7  PROT 6.3*  ALBUMIN 2.6*   No results for input(s): LIPASE, AMYLASE in the last 72 hours. Cardiac Enzymes  Recent Labs  11/26/14 1000 11/26/14 1433 11/26/14 1945 11/27/14 0226  CKTOTAL 15*  --   --   --   TROPONINI 0.74* 1.35* 4.06* 7.29*    TELE NSR with HR low 90s    ECG  No new EKG  Echocardiogram 10/20/2014  LV EF: 50% -  55%  ------------------------------------------------------------------- Indications:   Dyspnea 786.09.  ------------------------------------------------------------------- History:  PMH:  Coronary artery disease. Congestive heart failure. Primary pulmonary hypertension. Risk factors: Hypertension. Dyslipidemia.  ------------------------------------------------------------------- Study Conclusions  - Left ventricle: The cavity size was normal. Wall thickness was increased in a pattern of mild LVH. Systolic function was normal. The estimated ejection fraction was in the range of 50% to 55%. There is hypokinesis of the inferolateral myocardium. Doppler parameters are consistent with abnormal left ventricular relaxation (grade 1 diastolic dysfunction). Doppler parameters are consistent with high ventricular filling pressure. - Ventricular septum: The contour showed diastolic flattening and systolic flattening. - Aortic valve: There was mild regurgitation. - Mitral valve: Calcified annulus. There was mild regurgitation. - Left atrium: The atrium  was moderately dilated. - Right atrium: The atrium was mildly dilated. - Pulmonary arteries: Systolic pressure was severely increased. PA peak pressure: 60 mm Hg (S).  Impressions:  - Inferior lateral hypokinesis with overall low normal LV function; grade 1 diastolic dysfunction with elevated LV filling pressure; biatrial enlargement; calcified aortic valve; mean gradient of 15 mmHg suggests mild AS but visually, valve opens well, mild MR, AI and TR; severely elevated pulmonary pressure    Radiology/Studies  Dg Wrist Complete Left  11/01/2014   CLINICAL DATA:  Left wrist pain starting yesterday  EXAM: LEFT WRIST - COMPLETE 3+ VIEW  COMPARISON:  None.  FINDINGS: Four views of the left wrist submitted. No acute fracture or subluxation. There is narrowing of radiocarpal joint space. Degenerative changes first carpometacarpal joint.  IMPRESSION: No acute fracture or subluxation. Degenerative changes as described above.   Electronically Signed   By: Natasha MeadLiviu  Pop M.D.   On: 11/01/2014 13:01   Dg Abd 1 View  11/07/2014   CLINICAL DATA:  Pt states she has had abdominal pain for the last few days. Pt had a regular BM this morning and is not constipated- states the pain is unrelated to bowels.  EXAM: ABDOMEN - 1 VIEW  COMPARISON:  CT, 10/20/2014.  FINDINGS: Moderate gaseous distention of the stomach. Normal bowel gas pattern. No evidence of obstruction.  Soft tissues are unremarkable.  There stable changes from a lower lumbar spine posterior fusion.  IMPRESSION: 1. No acute findings. 2. No evidence of bowel obstruction or generalized adynamic ileus.   Electronically Signed   By: Amie Portlandavid  Ormond M.D.   On: 11/07/2014 16:28   Mr Brain Wo Contrast  11/07/2014   CLINICAL DATA:  Four day history of vomiting, diarrhea and weakness. Dizziness.  EXAM: MRI HEAD WITHOUT CONTRAST  TECHNIQUE: Multiplanar, multiecho pulse sequences of the brain and surrounding structures were obtained without intravenous  contrast.  COMPARISON:  MRA intracranial 10/1913.  CT head 02/07/2012.  FINDINGS: No evidence for acute infarction, hemorrhage, mass lesion, hydrocephalus, or extra-axial fluid. Moderate cerebral and cerebellar atrophy, premature for age. Minimal white matter disease. Flow voids are maintained throughout the carotid, basilar, and vertebral arteries. There are no areas of chronic hemorrhage. Pituitary, pineal, and cerebellar tonsils unremarkable. No upper cervical lesions. BILATERAL cataract extraction. No sinus or mastoid disease. Extracranial soft tissues otherwise unremarkable.  Compared with prior CT, further cerebral volume loss has occurred.  IMPRESSION: Moderate cerebral and cerebellar atrophy progressive from 2013.  No acute intracranial findings.   Electronically Signed   By: Davonna BellingJohn  Curnes M.D.   On: 11/07/2014 09:54   Nm Myocar Multi W/spect W/wall Motion / Ef  11/12/2014   CLINICAL DATA:  NSTEMI. History of coronary artery disease. Prior coronary artery stenting.  EXAM: MYOCARDIAL IMAGING WITH SPECT (REST AND PHARMACOLOGIC-STRESS)  GATED LEFT VENTRICULAR WALL  MOTION STUDY  LEFT VENTRICULAR EJECTION FRACTION  TECHNIQUE: Standard myocardial SPECT imaging was performed after resting intravenous injection of 10 mCi Tc-15m sestamibi. Subsequently, intravenous infusion of Lexiscan was performed under the supervision of the Cardiology staff. At peak effect of the drug, 30 mCi Tc-82m sestamibi was injected intravenously and standard myocardial SPECT imaging was performed. Quantitative gated imaging was also performed to evaluate left ventricular wall motion, and estimate left ventricular ejection fraction.  COMPARISON:  None.  FINDINGS: Perfusion: There is concern for mild reversibility along the inferolateral apex. There is fixed decreased uptake along the mid and basal segments of the inferolateral wall.  Wall Motion: Diffuse mild hypokinesia without focal abnormality.  Left Ventricular Ejection Fraction: 42 %   End diastolic volume 132 ml  End systolic volume 76 ml  IMPRESSION: 1. Concern for mild reversibility and ischemia along the apical segment of the inferolateral wall. Fixed defects involving the mid and basilar segments of the inferolateral wall could represent an area of infarct.  2. Diffuse mild hypokinesia.  3. Left ventricular ejection fraction is 42%.  4. Intermediate-risk stress test findings*.  *2012 Appropriate Use Criteria for Coronary Revascularization Focused Update: J Am Coll Cardiol. 2012;59(9):857-881. http://content.dementiazones.com.aspx?articleid=1201161  These results will be called to the ordering clinician or representative by the Radiologist Assistant, and communication documented in the PACS or zVision Dashboard.   Electronically Signed   By: Richarda Overlie M.D.   On: 11/12/2014 16:54   Dg Chest Port 1 View  11/28/2014   CLINICAL DATA:  Hypertension.  Congestive heart failure.  EXAM: PORTABLE CHEST - 1 VIEW  COMPARISON:  11/27/2014  FINDINGS: The right jugular central line extends into the SVC. Airspace opacities continue to worsen, predominantly central and basilar in distribution. This could represent worsening alveolar edema or multifocal infiltrates. No pneumothorax.  IMPRESSION: Worsening bilateral airspace opacities   Electronically Signed   By: Ellery Plunk M.D.   On: 11/28/2014 06:10   Dg Chest Port 1 View  11/27/2014   CLINICAL DATA:  Edema  EXAM: PORTABLE CHEST - 1 VIEW  COMPARISON:  11/26/2014  FINDINGS: The right jugular central line extends into the SVC. Multifocal airspace opacities are present, particularly in the right central and left basal regions. This could be infectious infiltrate. Alveolar edema could also produce this appearance. No large effusion is evident. No pneumothorax is evident.  IMPRESSION: Developing opacities in the central right lung and in the left base. Alveolar edema or infectious infiltrate could produce this appearance.   Electronically Signed    By: Ellery Plunk M.D.   On: 11/27/2014 05:32   Dg Chest Port 1 View  11/26/2014   CLINICAL DATA:  69 year old female with sepsis and shortness of Breath. Initial encounter.  EXAM: PORTABLE CHEST - 1 VIEW  COMPARISON:  11/07/2014 and earlier.  FINDINGS: Portable AP view at levin 32 hours. Right IJ approach dual lumen catheter is been removed and there is now a right IJ single lumen catheter in place. Tip projects just above the carina at the SVC level. Stable cardiomegaly and mediastinal contours. Increased vascular congestion without overt edema. No pneumothorax or pleural effusion identified. No consolidation identified. Visualized tracheal air column is within normal limits.  IMPRESSION: 1. Right IJ single lumen catheter now in place with removed previous tunneled type dual lumen catheter. Tip at the SVC level with no pneumothorax. 2. Pulmonary vascular congestion without overt edema. 3. Otherwise stable portable chest.   Electronically Signed   By: Althea Grimmer.D.  On: 11/26/2014 12:05   Dg Chest Port 1 View  11/07/2014   CLINICAL DATA:  Shortness of Breath  EXAM: PORTABLE CHEST - 1 VIEW  COMPARISON:  November 03, 2014  FINDINGS: Dual-lumen catheter present with tips in the superior vena cava. No pneumothorax. There is cardiomegaly with pulmonary venous hypertension and interstitial edema. There is no appreciable airspace consolidation. No adenopathy.  IMPRESSION: Evidence of congestive heart failure.  No pneumothorax.   Electronically Signed   By: Bretta Bang III M.D.   On: 11/07/2014 17:18   Dg Chest Port 1 View  11/03/2014   CLINICAL DATA:  New dialysis catheter.  EXAM: PORTABLE CHEST - 1 VIEW  COMPARISON:  October 29, 2014.  FINDINGS: Stable cardiomegaly. No pneumothorax or pleural effusion is noted. No acute pulmonary disease is noted. New right internal jugular dialysis catheter is noted with distal tip overlying the expected position of the SVC. Bony thorax is intact.  IMPRESSION: New right  internal jugular dialysis catheter is noted. No pneumothorax is noted. No acute cardiopulmonary abnormality seen.   Electronically Signed   By: Lupita Raider, M.D.   On: 11/03/2014 09:49   Dg Toe Great Left  11/26/2014   CLINICAL DATA:  Sepsis, shortness of breath, and fatigue. Burn involving the great toe. Evaluation for infection.  EXAM: LEFT GREAT TOE  COMPARISON:  10/23/2014 left foot radiographs  FINDINGS: An old, healed fracture of the distal second metatarsal shaft is again seen. Mild-to-moderate degenerative changes are noted involving the first MTP joint and great toe IP joint. No acute fracture, dislocation, or osteolysis is identified. No soft tissue emphysema is seen. Soft tissue swelling is again seen rather diffusely in the forefoot. Vascular calcifications are noted.  IMPRESSION: No acute osseous abnormality or radiographic evidence of active osteomyelitis involving the left great toe.   Electronically Signed   By: Sebastian Ache   On: 11/26/2014 12:08   Dg Fluoro Guide Cv Line-no Report  11/03/2014   CLINICAL DATA:    FLOURO GUIDE CV LINE  Fluoroscopy was utilized by the requesting physician.  No radiographic  interpretation.     ASSESSMENT AND PLAN  69 yo female with PMH of HTN, DM2, CAD (PCI to LCx in 2008), HFrEF with EF 40 on recent nuc, CKD admitted for septic shoch in the setting of positive c diff  1. NSTEMI likely demand ischemia however trop high than expected  - continue ASA, loaded with plavix  - Echo 10/20/2014 EF 50-55%, mild AR/MR, PA peak pressure  - myoview 11/12/2014 EF 42%, mild reversible ischemia along apical segment of inferolateral wall.  - Echo 11/27/2014 EF 30%, severe inferoseptal, inferior hypokinesis, inferolateral akinesis, grade 2 diastolic dysfunction, D shapped interventricular septum suggest RV pressure/volume overload, moderate MR with suspect infarct-related MR?, moderately to severely dilated RV, peak PA pressure  - not ideal candidate for  cath given renal insufficiency and severe anemia, will have MD to review echo report to see if need high risk cath.   2. Intermittent LBBB  3. Acute respiratory failure concerning for combination of PNA and combined systolic and diastolic HF  - CXR worsened this morning, patient is 8L positive since arrival. Levaquin added this morning, however will discuss with MD with concern of fluid overload as well.   4. Moderate HTN 5. Septic shock related to c diff colitis 6. Acute on chronic renal failure 7. Anemia  Signed, Azalee Course PA-C Pager: [phone]  I have seen and examined the patient  along with Azalee CourseMeng, Hao, PA.  I have reviewed the chart, notes and new data.  I agree with PA's note.  Key new complaints: worsening dyspnea Key examination changes: JVD and a few basal rales Key new findings / data: LV function worse than anticipated and strikingly poor RV function as well, significant MR and severe pulmonary HTN; multiple echo signs of hypervolemia  PLAN: Recommend diuretics, notwithstanding renal dysfunction. Poor response to the 40 mg of furosemide given at 11 AM. Consider Nephrology consultation. Will try another dose of 80 mg furosemide IV now. May need continued pressor support. After acute colitis improves and renal function stabilizes she should probably undergo right and left heart catheterization (next week?) to evaluate for multivessel CAD with relatively "balanced" ischemia, establish degree to which MR is contributing to pulmonary HTN and right heart failure.  Thurmon FairMihai Laurence Folz, MD, [facility] [phone] 11/28/2014, 2:12 PM

## 2014-11-28 NOTE — Procedures (Signed)
Central Venous dialysis Catheter Insertion Procedure Note Krista KalataMary O Wise [id number] [birth year]  Procedure: Insertion of Central Venous dialysis Catheter Indications: hemodialysis  Procedure Details Consent: Risks of procedure as well as the alternatives and risks of each were explained to the (patient/caregiver).  Consent for procedure obtained. Time Out: Verified patient identification, verified procedure, site/side was marked, verified correct patient position, special equipment/implants available, medications/allergies/relevent history reviewed, required imaging and test results available.  Performed Real time US was used to ID and cannulate the vessel  Maximum sterile technique was used including antiseptics, cap, gloves, gown, hand hygiene, mask and sheet. Skin prep: Chlorhexidine; local anesthetic administered A antimicrobial bonded/coated triple lumen catheter was placed in the left internal jugular vein using the Seldinger technique.  Evaluation Blood flow good Complications: No apparent complications Patient did tolerate procedure well. Chest X-ray ordered to verify placement.  CXR: pending.  BABCOCK,PETE 11/28/2014, 4:10 PM

## 2014-11-28 NOTE — Progress Notes (Signed)
[facility] See initial consult note Lasix 160 given at 3PM UOP since that dose 525 cc (2 hours) Will repeat 160 mg dose at 9PM   Camille Balynthia Fayth Trefry, MD [facility] [phone] Pager 11/28/2014, 6:13 PM

## 2014-11-28 NOTE — Consult Note (Signed)
Requesting Physician:  Dr. Royann Shiversroitoru Reason for Consult:  Oliguric AKI in setting of pressor dependent hypotension from septic shock with worsening resp status HPI: The patient is a 69 y.o. year-old with a background of DM, HTN, obesity, CAD, CHF (systolic and diastolic), OSA/severe pulmonary hypertension - known to our service after a 09/2014 admission for septic shock, AKI requiring renal replacement therapy from 10/20/14 through 11/01/14 (initially CRRT then IHD). Renal function improved down to a creatinine of 2.51 (prior to that hospital admission her baseline was 1.7 - 2).   [name] admitted on the current occasion following a fall at home, brought to the ED where she was found to be hypotensive from septic shock (HCAP, CDiff and UTI - cx enterobacter cloacae).   Creatinine on admission was 3. She was initially given NS at 125/hour after boluses in the ED. She has required norepinephrine for blood pressure support. She has had NSTEMI. [name] requiring BIPAP and is more SOB. UOP has progressively dropped off.  [name] 8 liters + since admission.  Creatinine is still around 2.5.   Potassium is still OK (<5). [name] severely anemic at 7.7.  PBabcock NP who just placed HD catheter indicated filling pressures so high that IJ was not compressible...  Just received a large dose of furosemide (160 mg) 3PM and has put out 375 of urine   CREATININE, SER  Date/Time Value Ref Range Status  11/28/2014 04:26 AM 2.53* 0.44 - 1.00 mg/dL Final  40/98/119105/11/2014 47:8203:02 AM 2.59* Admitted with pressor dependent hypotension, fall at home, +CDiff, HCAP 0.44 - 1.00 mg/dL Final  95/62/130805/10/2014 65:7810:00 AM 3.00* 0.44 - 1.00 mg/dL Final  46/96/295204/25/2016 84:1311:38 AM 2.51* 0.50 - 1.10 mg/dL Final  24/40/102704/23/2016 25:3606:28 AM 2.52* 0.50 - 1.10 mg/dL Final  64/40/347404/22/2016 25:9505:15 PM 2.36* 0.50 - 1.10 mg/dL Final  63/87/564304/22/2016 32:9506:45 AM 2.42* Diatek removed 0.50 - 1.10 mg/dL Final  18/84/166004/21/2016 63:0106:42 AM 2.55* 0.50 - 1.10 mg/dL Final  60/10/932304/20/2016 55:7305:30 AM 3.16* 0.50  - 1.10 mg/dL Final  22/02/542704/19/2016 06:2307:08 AM 3.53* 0.50 - 1.10 mg/dL Final  76/28/315104/18/2016 76:1605:36 AM 4.31* 0.50 - 1.10 mg/dL Final  07/37/106204/17/2016 69:4804:11 AM 4.99* 0.50 - 1.10 mg/dL Final  54/62/703504/16/2016 00:9304:25 AM 5.61* 0.50 - 1.10 mg/dL Final  81/82/993704/15/2016 16:9604:40 AM 6.39* 0.50 - 1.10 mg/dL Final  78/93/810104/14/2016 75:1006:41 AM 6.79* 0.50 - 1.10 mg/dL Final  25/85/277804/13/2016 24:2304:18 AM 7.26* 0.50 - 1.10 mg/dL Final  53/61/443104/05/2015 54:0006:37 PM 6.24* TDC placed 0.50 - 1.10 mg/dL Final  86/76/195004/03/2015 93:2604:05 AM 4.92* Last hemodialysis 0.50 - 1.10 mg/dL Final  71/24/580904/02/2015 98:3305:20 AM 3.29* 0.50 - 1.10 mg/dL Final  82/50/539704/01/2015 67:3405:00 AM 4.47* 0.50 - 1.10 mg/dL Final  19/37/902404/12/2014 09:7302:47 AM 3.04* 0.50 - 1.10 mg/dL Final  53/29/924204/11/2014 68:3402:49 AM 3.46* 0.50 - 1.10 mg/dL Final  19/62/229704/10/2014 98:9205:09 AM 1.83* 0.50 - 1.10 mg/dL Final  11/94/174004/09/2014 81:4401:33 PM 1.32* 0.50 - 1.10 mg/dL Final  81/85/631404/09/2014 97:0204:52 AM 1.42* 0.50 - 1.10 mg/dL Final  63/78/588504/08/2014 02:7703:43 PM 1.57* 0.50 - 1.10 mg/dL Final  41/28/786704/08/2014 67:2004:54 AM 1.56* 0.50 - 1.10 mg/dL Final  94/70/962804/07/2014 36:6204:05 PM 1.61* 0.50 - 1.10 mg/dL Final  94/76/546504/07/2014 03:5405:50 AM 1.52* 0.50 - 1.10 mg/dL Final  65/68/127503/31/2016 17:0004:55 PM 1.64* 0.50 - 1.10 mg/dL Final  17/49/449603/31/2016 75:9103:50 AM 1.82* 0.50 - 1.10 mg/dL Final  63/84/665903/30/2016 93:5704:30 PM 1.89* 0.50 - 1.10 mg/dL Final  01/77/939003/30/2016 30:0903:45 AM 2.34* 0.50 - 1.10 mg/dL Final  23/30/076203/29/2016 26:3304:00 PM 3.10* 0.50 - 1.10  mg/dL Final  16/04/9603 54:09 AM 3.74* 0.50 - 1.10 mg/dL Final  81/19/1478 29:56 PM 6.50* 0.50 - 1.10 mg/dL Final  21/30/8657 84:69 AM 8.30* 0.50 - 1.10 mg/dL Final  62/95/2841 32:44 AM 8.17*  CRRT stopped, transitioned to intermittent hemo 0.50 - 1.10 mg/dL Final  07/27/7251 66:44 PM 8.26* 0.50 - 1.10 mg/dL Final  03/47/4259 56:38 PM 9.02* 0.50 - 1.10 mg/dL Final  75/64/3329 51:88 PM 10.10* Required CRRT for AKI in setting of septic shock/heart failure, hyperkalemia, severe acidosis 0.50 - 1.10 mg/dL Final  41/66/0630 16:01 PM 10.15* 0.50 - 1.10 mg/dL Final  09/32/3557 32:20 PM 1.70* 0.50 - 1.10  mg/dL Final  25/42/7062 37:62 AM 2.06* 0.50 - 1.10 mg/dL Final  83/15/1761 60:73 AM 1.94* 0.50 - 1.10 mg/dL Final  71/12/2692 85:46 AM 2.30* 0.50 - 1.10 mg/dL Final  27/09/5007 38:18 AM 1.89* 0.50 - 1.10 mg/dL Final  29/93/7169 67:89 AM 1.72* 0.50 - 1.10 mg/dL Final  38/04/1750 02:58 AM 1.81* 0.50 - 1.10 mg/dL Final  52/77/8242 35:36 AM 2.10* 0.50 - 1.10 mg/dL Final  14/43/1540 08:67 AM 1.81* 0.50 - 1.10 mg/dL Final  61/95/0932 67:12 PM 1.57* 0.50 - 1.10 mg/dL Final    Past Medical History  Diagnosis Date  . Hypertension   . Depression   . Diabetes mellitus   . GERD (gastroesophageal reflux disease)   . Obesity   . Coronary artery disease 08/2006    Unstable angina. PCI to distal codominant CFX with 2.5 x 20 Taxus DES...  . CHF (congestive heart failure)   . Sleep apnea     stopped cpap  . Arthritis   . Headache(784.0)   . Hx of echocardiogram     Echo (11/15):  Mild LVH, EF 55-60%, no RWMA, Gr 1 DD, mild AI, mild MR, mod LAE.     Past Surgical History  Procedure Laterality Date  . Coronary stent placement  08/2006    CAD; stent placed @ [facility]  . Total abdominal hysterectomy    . Cardiac catheterization      x2            mcclean  . Lumbar fusion  02/02/2013  . Back surgery    . Right heart catheterization N/A 06/18/2014    Procedure: RIGHT HEART CATH;  Surgeon: Laurey Morale, MD;  Location: [facility] CATH LAB;  Service: Cardiovascular;  Laterality: N/A;  . Insertion of dialysis catheter Right 11/03/2014    Procedure: INSERTION OF DIALYSIS CATHETER;  Surgeon: Sherren Kerns, MD;  Location: [facility] OR;  Service: Vascular;  Laterality: Right;    Family History  Problem Relation Age of Onset  . Heart attack Father   . Heart failure Father   . Anemia Mother   . Hypertension Mother    Social History:  reports that she has never smoked. She has never used smokeless tobacco. She reports that she does not drink alcohol or use illicit drugs.  Allergies: No Known  Allergies  Home medications: Prior to Admission medications   Medication Sig Start Date End Date Taking? Authorizing Provider  aspirin EC 81 MG tablet Take 1 tablet (81 mg total) by mouth daily. 03/19/13  Yes Laurey Morale, MD  atorvastatin (LIPITOR) 40 MG tablet Take 1 tablet (40 mg total) by mouth daily at 6 PM. 11/25/14  Yes Mcarthur Rossetti Angiulli, PA-C  Cholecalciferol (VITAMIN D3) 1000 UNITS CAPS Take 1 capsule (1,000 Units total) by mouth daily. 11/25/14  Yes Daniel J Angiulli, PA-C  DULoxetine (CYMBALTA)  60 MG capsule Take 1 capsule (60 mg total) by mouth 2 (two) times daily. 11/25/14  Yes Daniel J Angiulli, PA-C  ferrous sulfate 325 (65 FE) MG tablet Take 1 tablet (325 mg total) by mouth 2 (two) times daily. 11/25/14  Yes Daniel J Angiulli, PA-C  glimepiride (AMARYL) 2 MG tablet Take 1 tablet (2 mg total) by mouth daily with breakfast. 11/25/14  Yes Mcarthur Rossetti Angiulli, PA-C  insulin glargine (LANTUS) 100 UNIT/ML injection Inject 0.3 mLs (30 Units total) into the skin at bedtime. 11/25/14  Yes Daniel J Angiulli, PA-C  lidocaine (LIDODERM) 5 % Place 1 patch onto the skin daily. Remove & Discard patch within 12 hours or as directed by MD 11/25/14  Yes Mcarthur Rossetti Angiulli, PA-C  metoprolol tartrate (LOPRESSOR) 25 MG tablet Take 0.5 tablets (12.5 mg total) by mouth 2 (two) times daily. 11/25/14  Yes Daniel J Angiulli, PA-C  Multiple Vitamin (MULTIVITAMIN WITH MINERALS) TABS Take 1 tablet by mouth daily.   Yes Historical Provider, MD  pantoprazole (PROTONIX) 40 MG tablet Take 1 tablet (40 mg total) by mouth daily. 11/25/14  Yes Daniel J Angiulli, PA-C  potassium chloride SA (K-DUR,KLOR-CON) 20 MEQ tablet Take 1 tablet (20 mEq total) by mouth daily. 11/25/14  Yes Daniel J Angiulli, PA-C  rOPINIRole (REQUIP) 2 MG tablet Take 1 tablet (2 mg total) by mouth at bedtime. 11/25/14  Yes Daniel J Angiulli, PA-C  silver sulfADIAZINE (SILVADENE) 1 % cream Apply 1 application topically daily. Wash left foot with soap and water daily.  Apply Silvadene with gauze circumferentially around her toes change daily 11/25/14  Yes Daniel J Angiulli, PA-C  sodium bicarbonate 650 MG tablet Take 1 tablet (650 mg total) by mouth 2 (two) times daily. 11/25/14  Yes Daniel J Angiulli, PA-C  sodium chloride (OCEAN) 0.65 % SOLN nasal spray Place 1 spray into both nostrils as needed for congestion. 11/25/14  Yes Daniel J Angiulli, PA-C  torsemide (DEMADEX) 20 MG tablet Take 3 tablets (60 mg total) by mouth daily. 11/25/14  Yes Mcarthur Rossetti Angiulli, PA-C    Inpatient medications: . antiseptic oral rinse  7 mL Mouth Rinse q12n4p  . aspirin EC  81 mg Oral Daily  . atorvastatin  40 mg Oral q1800  . chlorhexidine  15 mL Mouth Rinse BID  . clopidogrel  75 mg Oral Daily  . DULoxetine  60 mg Oral BID  . famotidine  20 mg Oral QHS  . furosemide  160 mg Intravenous Q12H  . insulin aspart  0-15 Units Subcutaneous TID AC & HS  . levofloxacin (LEVAQUIN) IV  750 mg Intravenous Q48H  . mupirocin cream   Topical Daily  . rOPINIRole  2 mg Oral QHS  . vancomycin  125 mg Oral QID   Infusions . sodium chloride    . heparin 1,350 Units/hr (11/28/14 0815)  . norepinephrine (LEVOPHED) Adult infusion 3 mcg/min (11/28/14 0800)    Review of Systems Pt says before coming to the hospital all she really remembers was going to the BR, falling and not able to get up. PTA said no CP, SOB, diarrhea or UTI sx. And was just "weak" Currently wearing BIPAP, reports SOB has been worse since last night, no chest pain. No nausea    Physical Exam:  BP 105/67 mmHg  Pulse 87  Temp(Src) 97.8 F (36.6 C) (Oral)  Resp 19  Ht  (1.676 m)  Wt 104.9 kg (231 lb 4.2 oz)  BMI 37.34 kg/m2  SpO2 100% Gen:  Ill appearing but pleasant WF Wearing BIPAP but awake and alert CVP 22 New left IJ temp HD cath +JVD Skin: no rash, cyanosis. Foot wound covered Chest: Anteroirly fairly clear Heart: S1S2 No audible S3 No rub Abdomen: soft, without focal tenderness Ext: 1+ edema. Foot wound  covered Neuro: alert, Ox3, no focal deficit  Labs: Basic Metabolic Panel:  Recent Labs Lab 11/26/14 1000 11/27/14 0302 11/28/14 0426  NA 135 139 135  K 5.6* 5.2* 4.8  CL 97* 107 104  CO2 20* 22 20*  GLUCOSE 325* 164* 193*  BUN 48* 43* 43*  CREATININE 3.00* 2.59* 2.53*  CALCIUM 8.7* 8.1* 8.1*  PHOS  --  4.2  --      Recent Labs Lab 11/26/14 1000  AST 23  ALT 14  ALKPHOS 82  BILITOT 0.7  PROT 6.3*  ALBUMIN 2.6*      Recent Labs Lab 11/26/14 1000 11/27/14 0302 11/28/14 0426  WBC 13.8* 13.7* 13.8*  NEUTROABS 12.3*  --   --   HGB 8.9* 8.2* 7.7*  HCT 29.6* 26.9* 25.5*  MCV 86.8 87.3 86.4  PLT 225 211 246    Recent Labs Lab 11/26/14 1000 11/26/14 1433 11/26/14 1945 11/27/14 0226 11/28/14 1237  CKTOTAL 15*  --   --   --   --   TROPONINI 0.74* 1.35* 4.06* 7.29* 1.58*     Recent Labs Lab 11/27/14 1205 11/27/14 1545 11/27/14 2203 11/28/14 0729 11/28/14 1157  GLUCAP 158* 180* 195* 185* 214*     Xrays/Other Studies: Dg Chest Port 1 View  11/28/2014   CLINICAL DATA:  Hypertension.  Congestive heart failure.  EXAM: PORTABLE CHEST - 1 VIEW  COMPARISON:  11/27/2014  FINDINGS: The right jugular central line extends into the SVC. Airspace opacities continue to worsen, predominantly central and basilar in distribution. This could represent worsening alveolar edema or multifocal infiltrates. No pneumothorax.  IMPRESSION: Worsening bilateral airspace opacities   Electronically Signed   By: Ellery Plunk M.D.   On: 11/28/2014 06:10   Dg Chest Port 1 View  11/27/2014   CLINICAL DATA:  Edema  EXAM: PORTABLE CHEST - 1 VIEW  COMPARISON:  11/26/2014  FINDINGS: The right jugular central line extends into the SVC. Multifocal airspace opacities are present, particularly in the right central and left basal regions. This could be infectious infiltrate. Alveolar edema could also produce this appearance. No large effusion is evident. No pneumothorax is evident.   IMPRESSION: Developing opacities in the central right lung and in the left base. Alveolar edema or infectious infiltrate could produce this appearance.   Electronically Signed   By: Ellery Plunk M.D.   On: 11/27/2014 05:32   Background 69 y.o. year-old with a background of DM, HTN, obesity, CAD, CHF (systolic and diastolic), OSA/severe pulmonary hypertension - known to our service after a 09/2014 admission for septic shock, AKI requiring renal replacement therapy from 10/20/14 through 11/01/14 (initially CRRT then IHD). Renal function improved down to a creatinine of 2.51 by discharge  (prior to that hospital admission her baseline was 1.7 - 2).   [name] admitted on the current occasion following a fall at home, brought to the ED where she was found to be hypotensive from septic shock (HCAP, CDiff and UTI - cx enterobacter cloacae).   Creatinine on admission was 3. She was initially given NS at 125/hour after boluses in the ED. She has required norepinephrine for blood pressure support. She has had NSTEMI and has had drop in EF  to 30% since 09/2014. [name] requiring BIPAP and is more SOB. UOP has progressively dropped off. [name] 8 liters + since admission.  Creatinine is still around 2.5.   Potassium is still OK (<5). [name] severely anemic at 7.7. Because of worsening dyspnea requiring BIPAP and declining UOP we are asked to see.  Impression/Plan 1. AKI on CKD - becoming oliguric with declining UOP past 72 hours and worsening dyspnea. K and acid base OK right now. She has been given a large dose of lasix (160 mg) and if she does not have a substantial amount of UOP  will require fluid removal with CRRT. Has HD cath in place.   On a positive note has put out 375 of urine so far post lasix  2. 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  Baseline creatinine 2.5. 3. Septic shock - requiring pressors 4. Acute resp failure - HCAP + pulm edema with severe pulm HTN,  diastolic and systolic CHF. Requiring BIPAP last 24 hours. As above if no significant response to high dose lasix will need fluid removal with CRRT 5. Systolic and diastolic heart failure  6. S/p NSTEMI - per cardiology. ASA/plavix. ECHO with drop in EF, moderate ?infarct related MR.  Cards assessing to see if high risk cath 7. CDiff+ - vancomycin  8. HCAP - s/p v/z-->to levaquin 9. Anemia - attributed to acute illness. Iron deficient last admission. Transfuse for Hb <7  Thanks for consult.  Will f/u on UOP and pt resp status later this evening to determine if need to start CRRT tonight.    Camille Balynthia Azlyn Wingler,  MD [facility] [phone] pager 11/28/2014, 3:22 PM

## 2014-11-29 ENCOUNTER — Inpatient Hospital Stay (HOSPITAL_COMMUNITY): Payer: Medicare Other

## 2014-11-29 LAB — BASIC METABOLIC PANEL
Anion gap: 11 (ref 5–15)
BUN: 42 mg/dL — ABNORMAL HIGH (ref 6–20)
CALCIUM: 8.3 mg/dL — AB (ref 8.9–10.3)
CO2: 20 mmol/L — AB (ref 22–32)
Chloride: 102 mmol/L (ref 101–111)
Creatinine, Ser: 2.33 mg/dL — ABNORMAL HIGH (ref 0.44–1.00)
GFR calc Af Amer: 24 mL/min — ABNORMAL LOW (ref >60)
GFR calc non Af Amer: 21 mL/min — ABNORMAL LOW (ref >60)
GLUCOSE: 183 mg/dL — AB (ref 70–99)
Potassium: 4.4 mmol/L (ref 3.5–5.1)
SODIUM: 133 mmol/L — AB (ref 135–145)

## 2014-11-29 LAB — GLUCOSE, CAPILLARY
GLUCOSE-CAPILLARY: 167 mg/dL — AB (ref 70–99)
Glucose-Capillary: 151 mg/dL — ABNORMAL HIGH (ref 70–99)
Glucose-Capillary: 162 mg/dL — ABNORMAL HIGH (ref 70–99)
Glucose-Capillary: 200 mg/dL — ABNORMAL HIGH (ref 70–99)
Glucose-Capillary: 110 mg/dL — ABNORMAL HIGH (ref 70–99)

## 2014-11-29 LAB — CBC
HCT: 24.5 % — ABNORMAL LOW (ref 36.0–46.0)
Hemoglobin: 7.5 g/dL — ABNORMAL LOW (ref 12.0–15.0)
MCH: 26.4 pg (ref 26.0–34.0)
MCHC: 30.6 g/dL (ref 30.0–36.0)
MCV: 86.3 fL (ref 78.0–100.0)
PLATELETS: 249 10*3/uL (ref 150–400)
RBC: 2.84 MIL/uL — ABNORMAL LOW (ref 3.87–5.11)
RDW: 20.6 % — ABNORMAL HIGH (ref 11.5–15.5)
WBC: 10.3 10*3/uL (ref 4.0–10.5)

## 2014-11-29 LAB — HEPARIN LEVEL (UNFRACTIONATED)
HEPARIN UNFRACTIONATED: 0.16 [IU]/mL — AB (ref 0.30–0.70)
Heparin Unfractionated: 0.26 IU/mL — ABNORMAL LOW (ref 0.30–0.70)
Heparin Unfractionated: 0.28 IU/mL — ABNORMAL LOW (ref 0.30–0.70)

## 2014-11-29 MED ORDER — DEXTROSE 5 % IV SOLN
160.0000 mg | Freq: Once | INTRAVENOUS | Status: AC
  Administered 2014-11-29: 160 mg via INTRAVENOUS
  Filled 2014-11-29: qty 16
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
## 2014-11-29 NOTE — Progress Notes (Signed)
Subjective:  Feeling much better today.  Significant diuresis following the administration of high-dose Lasix.  Blood pressure appears better.  No chest discomfort.  Objective:  Vital Signs in the last 24 hours: BP 122/77 mmHg  Pulse 93  Temp(Src) 97.5 F (36.4 C) (Oral)  Resp 23  Ht 5\' 6"  (1.676 m)  Wt 108.8 kg (239 lb 13.8 oz)  BMI 38.73 kg/m2  SpO2 99%  Physical Exam: Pleasant obese elderly female in no acute distress Lungs:  Clear Cardiac:  Regular rhythm, normal S1 and S2, no S3, 1/6 systolic murmur Abdomen:  Soft, nontender, no masses Extremities:  Trace edema present  Intake/Output from previous day: 05/06 0701 - 05/07 0700 In: 2301.3 [P.O.:600; I.V.:1369.3; IV Piggyback:332] Out: 2705 [Urine:2705]  Weight Filed Weights   11/26/14 0957 11/27/14 0500 11/29/14 0449  Weight: 98.431 kg (217 lb) 104.9 kg (231 lb 4.2 oz) 108.8 kg (239 lb 13.8 oz)    Lab Results: Basic Metabolic Panel:  Recent Labs  16/04/9604/06/16 0426 11/29/14 0400  NA 135 133*  K 4.8 4.4  CL 104 102  CO2 20* 20*  GLUCOSE 193* 183*  BUN 43* 42*  CREATININE 2.53* 2.33*   CBC:  Recent Labs  11/28/14 0426 11/29/14 0400  WBC 13.8* 10.3  HGB 7.7* 7.5*  HCT 25.5* 24.5*  MCV 86.4 86.3  PLT 246 249   Cardiac Enzymes:  Cardiac Panel (last 3 results)  Recent Labs  11/26/14 1945 11/27/14 0226 11/28/14 1237  TROPONINI 4.06* 7.29* 1.58*    Telemetry: Currently sinus rhythm with PACs  Assessment/Plan:  1.  Acute systolic congestive heart clinically improves may be due to sepsis but prior evidence of ischemia 2.  Intermittent left bundle branch block 3.  Septic shock related to C. difficile colitis  4.  Acute on chronic kidney insufficiency clinically improved today 5.  Severe anemia  Recommendations:  Awaiting recovery of renal function and continue diuresis.  [name] clinically more stable today.  Exam has improved.  Appreciate renal involvement.  Catheterization possibly plan next  week depending on clinical course and renal recovery.   Darden PalmerW. Spencer Tilley, Jr.  MD [facility]  11/29/2014, 11:25 AM

## 2014-11-29 NOTE — Progress Notes (Addendum)
[facility] Rounding Note Subjective:  Remains on norepi Had excellent diuretic response to 2 doses of lasix 160 mg  At 3PM and 9PM with 2.7 liters out CVP down a little Is still requiring norepinephrine  Objective Vital signs in last 24 hours: Filed Vitals:   11/29/14 0615 11/29/14 0700 11/29/14 0751 11/29/14 0800  BP: 125/76 118/70  106/61  Pulse: 98 95  94  Temp:   97.5 F (36.4 C)   TempSrc:   Oral   Resp: 19 20  23   Height:      Weight:      SpO2: 100% 99%  100%   Weight change:   Intake/Output Summary (Last 24 hours) at 11/29/14 0846 Last data filed at 11/29/14 0800  Gross per 24 hour  Intake 2132.78 ml  Output   2650 ml  Net -517.22 ml   Physical Exam:  BP 106/61 mmHg  Pulse 94  Temp(Src) 97.5 F (36.4 C) (Oral)  Resp 23  Ht 5\' 6"  (1.676 m)  Wt 108.8 kg (239 lb 13.8 oz)  BMI 38.73 kg/m2  SpO2 100% . ZOX:WRUEAGen:Awake, alert (and happy not on dialysis) On nasal cannula at present eating breakfast CVP 18 (from 22) Left IJ temp HD cath in place +JVD Skin: no rash, cyanosis. Foot wound covered Chest: Anterirly fairly clear Heart: S1S2 No audible S3 No rub Abdomen: soft, without focal tenderness Ext: 1+ edema. Foot wound covered Neuro: alert, Ox3, no focal deficit   Labs: Basic Metabolic Panel:  Recent Labs Lab 11/26/14 1000 11/27/14 0302 11/28/14 0426 11/29/14 0400  NA 135 139 135 133*  K 5.6* 5.2* 4.8 4.4  CL 97* 107 104 102  CO2 20* 22 20* 20*  GLUCOSE 325* 164* 193* 183*  BUN 48* 43* 43* 42*  CREATININE 3.00* 2.59* 2.53* 2.33*  CALCIUM 8.7* 8.1* 8.1* 8.3*  PHOS  --  4.2  --   --    Liver Function Tests:  Recent Labs Lab 11/26/14 1000  AST 23  ALT 14  ALKPHOS 82  BILITOT 0.7  PROT 6.3*  ALBUMIN 2.6*    Recent Labs Lab 11/26/14 1000 11/27/14 0302 11/28/14 0426 11/29/14 0400  WBC 13.8* 13.7* 13.8* 10.3  NEUTROABS 12.3*  --   --   --   HGB 8.9* 8.2* 7.7* 7.5*  HCT 29.6* 26.9* 25.5* 24.5*  MCV 86.8 87.3 86.4 86.3   PLT 225 211 246 249     Recent Labs Lab 11/26/14 1000 11/26/14 1433 11/26/14 1945 11/27/14 0226 11/28/14 1237  CKTOTAL 15*  --   --   --   --   TROPONINI 0.74* 1.35* 4.06* 7.29* 1.58*   CBG:  Recent Labs Lab 11/28/14 0729 11/28/14 1157 11/28/14 1614 11/28/14 2052 11/29/14 0748  GLUCAP 185* 214* 175* 127* 162*    Studies/Results: Dg Chest Port 1 View  11/29/2014   CLINICAL DATA:  Healthcare associated pneumonia  EXAM: PORTABLE CHEST - 1 VIEW  COMPARISON:  11/28/2014  FINDINGS: There is a left jugular central line extending into the SVC. There is a right jugular central line extending into the SVC. There is unchanged cardiomegaly. Mild basilar opacities persist bilaterally, as well as moderate vascular prominence. This is unchanged.  IMPRESSION: Support equipment appears satisfactorily positioned.  No significant interval change in the bilateral airspace opacities.   Electronically Signed   By: Ellery Plunkaniel R Mitchell M.D.   On: 11/29/2014 07:07   Dg Chest Port 1 View  11/28/2014   CLINICAL DATA:  Central line  placement. Congestive heart failure. Acute myocardial infarct. Septic shock.  EXAM: PORTABLE CHEST - 1 VIEW  COMPARISON:  11/28/2014  FINDINGS: A new left jugular dual-lumen central venous catheter is seen with tip overlying the distal SVC. No pneumothorax identified. Right jugular central venous catheter remains in appropriate position.  Cardiomegaly and diffuse pulmonary edema pattern shows no significant change.  IMPRESSION: New left jugular dual-lumen central venous catheter in appropriate position. No evidence of pneumothorax.  Congestive heart failure and diffuse pulmonary edema, without significant change.   Electronically Signed   By: Myles RosenthalJohn  Stahl M.D.   On: 11/28/2014 16:44   Dg Chest Port 1 View  11/28/2014   CLINICAL DATA:  Hypertension.  Congestive heart failure.  EXAM: PORTABLE CHEST - 1 VIEW  COMPARISON:  11/27/2014  FINDINGS: The right jugular central line extends into  the SVC. Airspace opacities continue to worsen, predominantly central and basilar in distribution. This could represent worsening alveolar edema or multifocal infiltrates. No pneumothorax.  IMPRESSION: Worsening bilateral airspace opacities   Electronically Signed   By: Ellery Plunkaniel R Mitchell M.D.   On: 11/28/2014 06:10   Medications: . sodium chloride    . heparin 1,350 Units/hr (11/28/14 2119)  . norepinephrine (LEVOPHED) Adult infusion 4 mcg/min (11/29/14 0400)   . antiseptic oral rinse  7 mL Mouth Rinse q12n4p  . aspirin EC  81 mg Oral Daily  . atorvastatin  40 mg Oral q1800  . chlorhexidine  15 mL Mouth Rinse BID  . clopidogrel  75 mg Oral Daily  . DULoxetine  60 mg Oral BID  . famotidine  20 mg Oral QHS  . insulin aspart  0-15 Units Subcutaneous TID AC & HS  . levofloxacin (LEVAQUIN) IV  750 mg Intravenous Q48H  . mupirocin cream   Topical Daily  . rOPINIRole  2 mg Oral QHS  . vancomycin  125 mg Oral QID    I  have reviewed scheduled and prn medications.  Background 69 y.o. year-old with a background of DM, HTN, obesity, CAD, CHF (systolic and diastolic), OSA/severe pulmonary hypertension - known to our service after a 09/2014 admission for septic shock, AKI requiring renal replacement therapy from 10/20/14 through 11/01/14 (initially CRRT then IHD). Renal function improved down to a creatinine of 2.51 by discharge (prior to that hospital admission her baseline was 1.7 - 2). [name] admitted on the current occasion following a fall at home, brought to the ED where she was found to be hypotensive from septic shock (HCAP, CDiff and UTI - cx enterobacter cloacae). Creatinine on admission was 3. She was initially given NS at 125/hour after boluses in the ED. She has required norepinephrine for blood pressure support. She has had NSTEMI and has had drop in EF to 30% since 09/2014. She was requiring BIPAP and was more SOB. UOP had progressively dropped off,  with 8 liters + balance since admission.  Because of worsening dyspnea requiring BIPAP and declining UOP we were asked to see on 11/28/14.   Impression/Plan 1. AKI on CKD - became oliguric with progressive dyspnea and required BIPAP. Has responded nicely to high dose lasix (so far 2 doses of 160 mg IV) and 2.7 liters of UOP and a small drop in CVP 22->18. Creatinine has actually improved and K is fine. [name] without indications at this time for renal replacement therapy. Will repeat a dose of lasix 160 - can receive doses prn. 2. 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 Baseline creatinine 2.5. 3. Septic shock - still requiring pressors 4. Acute resp failure - HCAP + pulm edema with severe pulm HTN, diastolic and systolic CHF. Off BIPAP right now to eat - will see if needs to resume. Right now diuretic responsive - albeit to high doses.          5. Systolic and diastolic heart failure  6. S/p NSTEMI - per cardiology. ASA/plavix. ECHO with drop in EF, moderate ?infarct related MR. Cards assessing to see if high risk cath 7. CDiff+ - vancomycin  8. HCAP - s/p v/z-->to levaquin 9. Anemia - attributed to acute illness. Iron deficient last admission. Transfuse for Hb <7  Camille Bal, MD [facility] [phone] pager 11/29/2014, 8:46 AM

## 2014-11-29 NOTE — Progress Notes (Signed)
ANTICOAGULATION CONSULT NOTE - Follow Up Consult  Pharmacy Consult for heparin Indication: NSTEMI  Labs:  Recent Labs  11/26/14 1000  11/26/14 1630 11/26/14 1945  11/27/14 0226 11/27/14 0302  11/27/14 1840 11/28/14 0426 11/28/14 1237 11/29/14 0400  HGB 8.9*  --   --   --   --   --  8.2*  --   --  7.7*  --  7.5*  HCT 29.6*  --   --   --   --   --  26.9*  --   --  25.5*  --  24.5*  PLT 225  --   --   --   --   --  211  --   --  246  --  249  APTT 28  --   --   --   --   --   --   --   --   --   --   --   LABPROT 14.7  --  15.3*  --   --   --   --   --   --   --   --   --   INR 1.13  --  1.20  --   --   --   --   --   --   --   --   --   HEPARINUNFRC  --   --   --   --   < >  --   --   < > 0.33 0.44  --  0.26*  CREATININE 3.00*  --   --   --   --   --  2.59*  --   --  2.53*  --   --   CKTOTAL 15*  --   --   --   --   --   --   --   --   --   --   --   TROPONINI 0.74*  < >  --  4.06*  --  7.29*  --   --   --   --  1.58*  --   < > = values in this interval not displayed.   Assessment/Plan:  69yo female subtherapeutic on heparin after stable levels though RN reports that heparin had been held x4hr last pm 1700-2100 for bleeding, now resolved. Will continue gtt at current rate for now and check additional level.   Vernard GamblesVeronda Philip Eckersley, PharmD, BCPS  11/29/2014,5:38 AM

## 2014-11-29 NOTE — Progress Notes (Addendum)
ANTICOAGULATION CONSULT NOTE  Pharmacy Consult for Heparin Indication: chest pain/ACS   No Known Allergies  Patient Measurements: Height: 5\' 6"  (167.6 cm) Weight: 239 lb 13.8 oz (108.8 kg) IBW/kg (Calculated) : 59.3 Heparin Dosing Weight: 83 kg  Vital Signs: Temp: 97.5 F (36.4 C) (05/07 0751) Temp Source: Oral (05/07 0751) BP: 122/77 mmHg (05/07 1000) Pulse Rate: 93 (05/07 1100)  Labs:  Recent Labs  11/26/14 1630 11/26/14 1945  11/27/14 0226  11/27/14 0302  11/28/14 0426 11/28/14 1237 11/29/14 0400 11/29/14 1000  HGB  --   --   --   --   < > 8.2*  --  7.7*  --  7.5*  --   HCT  --   --   --   --   --  26.9*  --  25.5*  --  24.5*  --   PLT  --   --   --   --   --  211  --  246  --  249  --   LABPROT 15.3*  --   --   --   --   --   --   --   --   --   --   INR 1.20  --   --   --   --   --   --   --   --   --   --   HEPARINUNFRC  --   --   < >  --   --   --   < > 0.44  --  0.26* 0.16*  CREATININE  --   --   --   --   --  2.59*  --  2.53*  --  2.33*  --   TROPONINI  --  4.06*  --  7.29*  --   --   --   --  1.58*  --   --   < > = values in this interval not displayed.  Estimated Creatinine Clearance: 29.7 mL/min (by C-G formula based on Cr of 2.33).  Assessment: 69 y.o. female readmitted 5/4 from rehab due generalized weakness/fall and continued diarrhea.  Anticoagulation: Pt with NSTEMI on heparin. Heparin level remains SUBtherapeutic (0.16) - actually trending down from a.m. check - remains on 1350 units/hr. Noted that heparin was held 5/6 pm 1700-2100 for bleeding from HD cath. No further bleeding. Hgb down to 7.5, plt stable.  Noted pt with therapeutic heparin levels on this same rate for past few days - pt with improving SCr so she may be clearing heparin more effectively.  Goal of Therapy:  Heparin level 0.3-0.7 units/ml Monitor platelets by anticoagulation protocol: Yes Resolution of infection   Plan:  -Increase heparin to 1550 units/hr -F/u 6 hr heparin  level  Christoper Fabianaron Jennet Scroggin, PharmD, BCPS Clinical pharmacist, pager [phone] 11/28/2014 7:48 AM   Addendum 1900: Heparin level 0.28 (slightly subtherapeutic) on 1550 units/hr. No issues with line. No further bleeding.  Plan: -Increase heparin to 1650 units/hr -F/u 6 hr heparin level  Christoper Fabianaron Sendy Pluta, PharmD, BCPS Clinical pharmacist, pager [phone] 11/29/2014 6:53 PM

## 2014-11-30 ENCOUNTER — Inpatient Hospital Stay (HOSPITAL_COMMUNITY): Payer: Medicare Other

## 2014-11-30 LAB — RENAL FUNCTION PANEL
ANION GAP: 11 (ref 5–15)
Albumin: 2.2 g/dL — ABNORMAL LOW (ref 3.5–5.0)
BUN: 42 mg/dL — AB (ref 6–20)
CO2: 22 mmol/L (ref 22–32)
CREATININE: 2.23 mg/dL — AB (ref 0.44–1.00)
Calcium: 8.3 mg/dL — ABNORMAL LOW (ref 8.9–10.3)
Chloride: 101 mmol/L (ref 101–111)
GFR calc Af Amer: 25 mL/min — ABNORMAL LOW (ref >60)
GFR calc non Af Amer: 22 mL/min — ABNORMAL LOW (ref >60)
Glucose, Bld: 131 mg/dL — ABNORMAL HIGH (ref 70–99)
PHOSPHORUS: 4.6 mg/dL (ref 2.5–4.6)
Potassium: 4.2 mmol/L (ref 3.5–5.1)
Sodium: 134 mmol/L — ABNORMAL LOW (ref 135–145)

## 2014-11-30 LAB — GLUCOSE, CAPILLARY
GLUCOSE-CAPILLARY: 190 mg/dL — AB (ref 70–99)
GLUCOSE-CAPILLARY: 137 mg/dL — AB (ref 70–99)
GLUCOSE-CAPILLARY: 113 mg/dL — AB (ref 70–99)
Glucose-Capillary: 128 mg/dL — ABNORMAL HIGH (ref 70–99)

## 2014-11-30 LAB — HEPARIN LEVEL (UNFRACTIONATED)
Heparin Unfractionated: 0.32 IU/mL (ref 0.30–0.70)
Heparin Unfractionated: 0.51 IU/mL (ref 0.30–0.70)

## 2014-11-30 LAB — CBC
HCT: 24.7 % — ABNORMAL LOW (ref 36.0–46.0)
Hemoglobin: 7.5 g/dL — ABNORMAL LOW (ref 12.0–15.0)
MCH: 26.1 pg (ref 26.0–34.0)
MCHC: 30.4 g/dL (ref 30.0–36.0)
MCV: 86.1 fL (ref 78.0–100.0)
PLATELETS: 250 10*3/uL (ref 150–400)
RBC: 2.87 MIL/uL — ABNORMAL LOW (ref 3.87–5.11)
RDW: 20.7 % — AB (ref 11.5–15.5)
WBC: 7.9 10*3/uL (ref 4.0–10.5)

## 2014-11-30 MED ORDER — FUROSEMIDE 10 MG/ML IJ SOLN
160.0000 mg | Freq: Once | INTRAVENOUS | Status: AC
  Administered 2014-11-30: 160 mg via INTRAVENOUS
  Filled 2014-11-30 (×3): qty 16

## 2014-11-30 NOTE — Progress Notes (Signed)
Subjective:  Feeling much better today.   No chest discomfort. Resting well.  Objective:  Vital Signs in the last 24 hours: BP 94/49 mmHg  Pulse 85  Temp(Src) 97.9 F (36.6 C) (Oral)  Resp 19  Ht 5\' 6"  (1.676 m)  Wt 236 lb 8.9 oz (107.3 kg)  BMI 38.20 kg/m2  SpO2 100%  Physical Exam: Pleasant obese elderly female in no acute distress Lungs:  Clear Cardiac:  Regular rhythm, normal S1 and S2, no S3, 1/6 systolic murmur Abdomen:  Soft, nontender, no masses obese Extremities:  Trace edema present, HD cath and RIJ  Intake/Output from previous day: 05/07 0701 - 05/08 0700 In: 1339.5 [P.O.:480; I.V.:809.5; IV Piggyback:50] Out: 1600 [Urine:1600]  Weight Filed Weights   11/27/14 0500 11/29/14 0449 11/30/14 0500  Weight: 231 lb 4.2 oz (104.9 kg) 239 lb 13.8 oz (108.8 kg) 236 lb 8.9 oz (107.3 kg)    Lab Results: Basic Metabolic Panel:  Recent Labs  08/65/7804/02/07 0400 11/30/14 0100  NA 133* 134*  K 4.4 4.2  CL 102 101  CO2 20* 22  GLUCOSE 183* 131*  BUN 42* 42*  CREATININE 2.33* 2.23*   CBC:  Recent Labs  11/29/14 0400 11/30/14 0100  WBC 10.3 7.9  HGB 7.5* 7.5*  HCT 24.5* 24.7*  MCV 86.3 86.1  PLT 249 250   Cardiac Enzymes:  Cardiac Panel (last 3 results)  Recent Labs  11/28/14 1237  TROPONINI 1.58*    Telemetry: Currently sinus rhythm with PACs  ECHO: EF 30%, PA 60mmHg.   Assessment/Plan:  69 year old  1.  Acute systolic congestive heart clinically improves may be due to sepsis but prior evidence of ischemia 2.  Intermittent left bundle branch block 3.  Septic shock related to C. difficile colitis. Norepi  4.  Acute on chronic kidney insufficiency clinically improved today 5.  Severe anemia 6.  Pulmonary hypertension 7.  NSTEMI - ASA Plavix, moderate MR likely ischemic?  Recommendations:  Awaiting recovery of renal function and continue diuresis. Reviewed Dr. Eliott Nineunham note. AM lasix ordered. [name] clinically more stable today.  Exam has  improved.  Appreciate renal involvement (signed off today, keeping HD cath in place in case AKI with cath).  Catheterization possibly plan next week depending on clinical course and renal recovery. Creat 2.3>>2.2.   Donato SchultzSKAINS, Rebeka Kimble, MD   11/30/2014, 9:53 AM

## 2014-11-30 NOTE — Progress Notes (Signed)
ANTICOAGULATION CONSULT NOTE - Follow up Pharmacy Consult for Heparin Indication: chest pain/ACS   No Known Allergies  Patient Measurements: Height: 5\' 6"  (167.6 cm) Weight: 239 lb 13.8 oz (108.8 kg) IBW/kg (Calculated) : 59.3 Heparin Dosing Weight: 83 kg  Vital Signs: Temp: 97.8 F (36.6 C) (05/08 0023) Temp Source: Oral (05/08 0023) BP: 93/53 mmHg (05/07 2200) Pulse Rate: 91 (05/08 0007)  Labs:  Recent Labs  11/28/14 0426 11/28/14 1237 11/29/14 0400 11/29/14 1000 11/29/14 1815 11/30/14 0100 11/30/14 0119  HGB 7.7*  --  7.5*  --   --  7.5*  --   HCT 25.5*  --  24.5*  --   --  24.7*  --   PLT 246  --  249  --   --  250  --   HEPARINUNFRC 0.44  --  0.26* 0.16* 0.28*  --  0.32  CREATININE 2.53*  --  2.33*  --   --  2.23*  --   TROPONINI  --  1.58*  --   --   --   --   --     Estimated Creatinine Clearance: 31 mL/min (by C-G formula based on Cr of 2.23).  Assessment:  Heparin level 0.32 on heparin IV 1650 units/hr for NSTEMI in this 69 y.o. Female. Therapeutic heparin level. No bleeding noted   Goal of Therapy:  Heparin level 0.3-0.7 units/ml Monitor platelets by anticoagulation protocol: Yes Resolution of infection   Plan:  Conitnue heparin at 1650 units/hr F/u AM heparin level today. Daily heparin level and CBC  [name], RPh Clinical Pharmacist Pager: [phone] 11/30/2014 2:52 AM
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
## 2014-11-30 NOTE — Progress Notes (Signed)
[facility] Rounding Note Subjective:  Remains on norepi but dose down from 15 mcgs yesterday AM to 1 mcg today 3 total doses of lasix over past 36 hours with UOP 1.3-2.3 liters (1.3 yesterday) CVP remains 18-19  Doesn't feel as well today - still with cough and with some left sided ?chest wall/upper abd that she thinks relates to coughing   Objective Vital signs in last 24 hours: Filed Vitals:   11/30/14 0444 11/30/14 0500 11/30/14 0600 11/30/14 0700  BP:  104/86 122/63 94/49  Pulse:  85 83 85  Temp: 98.3 F (36.8 C)     TempSrc: Oral     Resp:  22 17 19   Height:      Weight:  107.3 kg (236 lb 8.9 oz)    SpO2:  95% 94% 100%   Weight change: -1.5 kg (-3 lb 4.9 oz)  Intake/Output Summary (Last 24 hours) at 11/30/14 0738 Last data filed at 11/30/14 0700  Gross per 24 hour  Intake 1339.47 ml  Output   1600 ml  Net -260.53 ml   Physical Exam:  BP 94/49 mmHg  Pulse 85  Temp(Src) 98.3 F (36.8 C) (Oral)  Resp 19  Ht 5\' 6"  (1.676 m)  Wt 107.3 kg (236 lb 8.9 oz)  BMI 38.20 kg/m2  SpO2 100% . Gen: Awake, alert, lying on right side, "just don't feel as well" On nasal cannula at present eating breakfast CVP 19 Left IJ temp HD cath in place (5/6) Chest: Anterirly fairly clear; posteriorly right crackles Left side fairly clear Heart: S1S2 No audible S3 No rub Abdomen: soft, without focal tenderness Ext: 1+ edema.   Labs: Basic Metabolic Panel:  Recent Labs Lab 11/26/14 1000 11/27/14 0302 11/28/14 0426 11/29/14 0400 11/30/14 0100  NA 135 139 135 133* 134*  K 5.6* 5.2* 4.8 4.4 4.2  CL 97* 107 104 102 101  CO2 20* 22 20* 20* 22  GLUCOSE 325* 164* 193* 183* 131*  BUN 48* 43* 43* 42* 42*  CREATININE 3.00* 2.59* 2.53* 2.33* 2.23*  CALCIUM 8.7* 8.1* 8.1* 8.3* 8.3*  PHOS  --  4.2  --   --  4.6   Liver Function Tests:  Recent Labs Lab 11/26/14 1000 11/30/14 0100  AST 23  --   ALT 14  --   ALKPHOS 82  --   BILITOT 0.7  --   PROT 6.3*  --    ALBUMIN 2.6* 2.2*    Recent Labs Lab 11/26/14 1000 11/27/14 0302 11/28/14 0426 11/29/14 0400 11/30/14 0100  WBC 13.8* 13.7* 13.8* 10.3 7.9  NEUTROABS 12.3*  --   --   --   --   HGB 8.9* 8.2* 7.7* 7.5* 7.5*  HCT 29.6* 26.9* 25.5* 24.5* 24.7*  MCV 86.8 87.3 86.4 86.3 86.1  PLT 225 211 246 249 250     Recent Labs Lab 11/26/14 1000 11/26/14 1433 11/26/14 1945 11/27/14 0226 11/28/14 1237  CKTOTAL 15*  --   --   --   --   TROPONINI 0.74* 1.35* 4.06* 7.29* 1.58*   CBG:  Recent Labs Lab 11/28/14 2308 11/29/14 0748 11/29/14 1307 11/29/14 1705 11/29/14 2158  GLUCAP 151* 162* 200* 167* 110*    Studies/Results: Dg Chest Port 1 View  11/29/2014   CLINICAL DATA:  Healthcare associated pneumonia  EXAM: PORTABLE CHEST - 1 VIEW  COMPARISON:  11/28/2014  FINDINGS: There is a left jugular central line extending into the SVC. There is a right jugular central line extending  into the SVC. There is unchanged cardiomegaly. Mild basilar opacities persist bilaterally, as well as moderate vascular prominence. This is unchanged.  IMPRESSION: Support equipment appears satisfactorily positioned.  No significant interval change in the bilateral airspace opacities.   Electronically Signed   By: Ellery Plunkaniel R Mitchell M.D.   On: 11/29/2014 07:07   Dg Chest Port 1 View  11/28/2014   CLINICAL DATA:  Central line placement. Congestive heart failure. Acute myocardial infarct. Septic shock.  EXAM: PORTABLE CHEST - 1 VIEW  COMPARISON:  11/28/2014  FINDINGS: A new left jugular dual-lumen central venous catheter is seen with tip overlying the distal SVC. No pneumothorax identified. Right jugular central venous catheter remains in appropriate position.  Cardiomegaly and diffuse pulmonary edema pattern shows no significant change.  IMPRESSION: New left jugular dual-lumen central venous catheter in appropriate position. No evidence of pneumothorax.  Congestive heart failure and diffuse pulmonary edema, without  significant change.   Electronically Signed   By: Myles RosenthalJohn  Stahl M.D.   On: 11/28/2014 16:44   Medications: . sodium chloride    . heparin 1,650 Units/hr (11/30/14 0338)  . norepinephrine (LEVOPHED) Adult infusion 1 mcg/min (11/30/14 0318)   . antiseptic oral rinse  7 mL Mouth Rinse q12n4p  . aspirin EC  81 mg Oral Daily  . atorvastatin  40 mg Oral q1800  . chlorhexidine  15 mL Mouth Rinse BID  . clopidogrel  75 mg Oral Daily  . DULoxetine  60 mg Oral BID  . famotidine  20 mg Oral QHS  . insulin aspart  0-15 Units Subcutaneous TID AC & HS  . levofloxacin (LEVAQUIN) IV  750 mg Intravenous Q48H  . mupirocin cream   Topical Daily  . rOPINIRole  2 mg Oral QHS  . vancomycin  125 mg Oral QID    I  have reviewed scheduled and prn medications.  Background 69 y.o. year-old with a background of DM, HTN, obesity, CAD, CHF (systolic and diastolic), OSA/severe pulmonary hypertension - known to our service after a 09/2014 admission for septic shock, AKI requiring renal replacement therapy from 10/20/14 through 11/01/14 (initially CRRT then IHD). Renal function improved down to a creatinine of 2.51 by discharge (prior to that hospital admission her baseline was 1.7 - 2). [name] admitted on the current occasion following a fall at home, brought to the ED where she was found to be hypotensive from septic shock (HCAP, CDiff and UTI - cx enterobacter cloacae). Creatinine on admission was 3. She was initially given NS at 125/hour after boluses in the ED. She has required norepinephrine for blood pressure support. She has had NSTEMI and has had drop in EF to 30% since 09/2014 by report. She was requiring BIPAP and was more SOB. UOP had progressively dropped off,  with 8 liters + balance since admission. Because of worsening dyspnea requiring BIPAP and declining UOP we were asked to see on 11/28/14.   Impression/Plan 1. AKI on CKD - became oliguric with progressive dyspnea and required BIPAP. Responded nicely to  high dose lasix (160 mg doses - 3 in the last 36 hours) with improved diuresis. CVP remains 18-19 and still with evidence edema on CXR.  Fortunately renal function has done well  despite diuresis and creatinine has actually dropped a little bit - 2.2 today.  She remains without indications for renal replacement therapy 2. 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 3. Septic shock - still requiring pressors but dose is weaned downto 1 mcg. BP in the 90's right now. 4. Acute resp failure - HCAP + pulm edema with severe pulm HTN, diastolic and systolic CHF. Off BIPAP. On nasal cannula.   5. Systolic and diastolic heart failure - by report of cardiology EF has dropped to 30% (? I can't find the most recent ECHO report)  6. S/p NSTEMI - per cardiology. ASA/plavix. ECHO with drop in EF, moderate ?infarct related MR. Cards assessing to see if high risk cath 7. CDiff+ - vancomycin  8. HCAP - s/p v/z-->to levaquin 9. Anemia - attributed to acute illness. Iron deficient last admission. Transfuse for Hb <7. No iron while active infection.  [name] responding to diuretics now with stable (actually slightly improved) renal function. I have ordered dose of lasix for this AM based on exam, CXR and CVP. She has a temp IJ HD catheter in place which I would recommend keeping in until decision is made about heart cath because she is at very high risk for development of contrast nephropathy if gets cath - and she understands that risk - that being said if has heart cath, leave the HD catheter in until clear stable renal fx after.  At this point - I will sign off. Defer further diuretic dosing to cardiology and CCM (I did order dose of lasix this AM).  Please call again if needed.  Camille Bal, MD [facility] [phone] Pager 11/30/2014, 7:51 AM

## 2014-11-30 NOTE — Progress Notes (Signed)
ANTICOAGULATION CONSULT NOTE  Pharmacy Consult for Heparin Indication: NSTEMI  No Known Allergies  Patient Measurements: Height: 5\' 6"  (167.6 cm) Weight: 236 lb 8.9 oz (107.3 kg) IBW/kg (Calculated) : 59.3 Heparin Dosing Weight: 83 kg  Vital Signs: Temp: 97.9 F (36.6 C) (05/08 0746) Temp Source: Oral (05/08 0746) BP: 89/54 mmHg (05/08 1000) Pulse Rate: 83 (05/08 1000)  Labs:  Recent Labs  11/28/14 0426 11/28/14 1237 11/29/14 0400  11/29/14 1815 11/30/14 0100 11/30/14 0119 11/30/14 0707  HGB 7.7*  --  7.5*  --   --  7.5*  --   --   HCT 25.5*  --  24.5*  --   --  24.7*  --   --   PLT 246  --  249  --   --  250  --   --   HEPARINUNFRC 0.44  --  0.26*  < > 0.28*  --  0.32 0.51  CREATININE 2.53*  --  2.33*  --   --  2.23*  --   --   TROPONINI  --  1.58*  --   --   --   --   --   --   < > = values in this interval not displayed.  Estimated Creatinine Clearance: 30.8 mL/min (by C-G formula based on Cr of 2.23).  Assessment: 69 y.o. female readmitted 5/4 from rehab due generalized weakness/fall and continued diarrhea.  Anticoagulation: Pt with NSTEMI on heparin. Heparin level therapeutic (0.51) on 1650 units/hr. No further bleeding. Hgb 7.5 -low but stable, plt stable.  Goal of Therapy:  Heparin level 0.3-0.7 units/ml Monitor platelets by anticoagulation protocol: Yes Resolution of infection   Plan:  -Continue heparin at 1650 units/hr -F/u 6 hr heparin level  Christoper Fabianaron Nastacia Raybuck, PharmD, BCPS Clinical pharmacist, pager [phone] 11/30/2014 10:58 AM

## 2014-12-01 DIAGNOSIS — I471 Supraventricular tachycardia: Secondary | ICD-10-CM | POA: Diagnosis not present

## 2014-12-01 LAB — BASIC METABOLIC PANEL
Anion gap: 10 (ref 5–15)
BUN: 39 mg/dL — AB (ref 6–20)
CALCIUM: 8.4 mg/dL — AB (ref 8.9–10.3)
CO2: 22 mmol/L (ref 22–32)
Chloride: 101 mmol/L (ref 101–111)
Creatinine, Ser: 2.11 mg/dL — ABNORMAL HIGH (ref 0.44–1.00)
GFR, EST AFRICAN AMERICAN: 27 mL/min — AB (ref >60)
GFR, EST NON AFRICAN AMERICAN: 23 mL/min — AB (ref >60)
GLUCOSE: 125 mg/dL — AB (ref 70–99)
POTASSIUM: 4.2 mmol/L (ref 3.5–5.1)
SODIUM: 133 mmol/L — AB (ref 135–145)

## 2014-12-01 LAB — GLUCOSE, CAPILLARY
GLUCOSE-CAPILLARY: 131 mg/dL — AB (ref 70–99)
Glucose-Capillary: 116 mg/dL — ABNORMAL HIGH (ref 70–99)
Glucose-Capillary: 121 mg/dL — ABNORMAL HIGH (ref 70–99)
Glucose-Capillary: 112 mg/dL — ABNORMAL HIGH (ref 70–99)

## 2014-12-01 LAB — CBC
HCT: 23.7 % — ABNORMAL LOW (ref 36.0–46.0)
Hemoglobin: 7.2 g/dL — ABNORMAL LOW (ref 12.0–15.0)
MCH: 26.6 pg (ref 26.0–34.0)
MCHC: 30.4 g/dL (ref 30.0–36.0)
MCV: 87.5 fL (ref 78.0–100.0)
PLATELETS: 238 10*3/uL (ref 150–400)
RBC: 2.71 MIL/uL — ABNORMAL LOW (ref 3.87–5.11)
RDW: 20.9 % — ABNORMAL HIGH (ref 11.5–15.5)
WBC: 6.6 10*3/uL (ref 4.0–10.5)

## 2014-12-01 LAB — PREPARE RBC (CROSSMATCH)

## 2014-12-01 LAB — HEPARIN LEVEL (UNFRACTIONATED): HEPARIN UNFRACTIONATED: 0.4 [IU]/mL (ref 0.30–0.70)

## 2014-12-01 MED ORDER — METOPROLOL TARTRATE 12.5 MG HALF TABLET
12.5000 mg | ORAL_TABLET | Freq: Two times a day (BID) | ORAL | Status: DC
  Administered 2014-12-01 – 2014-12-06 (×10): 12.5 mg via ORAL
  Filled 2014-12-01 (×12): qty 1

## 2014-12-01 MED ORDER — SODIUM CHLORIDE 0.9 % IV SOLN: Freq: Once | INTRAVENOUS | Status: DC

## 2014-12-01 MED ORDER — ENOXAPARIN SODIUM 60 MG/0.6ML ~~LOC~~ SOLN
50.0000 mg | SUBCUTANEOUS | Status: DC
  Administered 2014-12-01 – 2014-12-05 (×5): 50 mg via SUBCUTANEOUS
  Filled 2014-12-01 (×6): qty 0.6

## 2014-12-01 MED ORDER — FUROSEMIDE 10 MG/ML IJ SOLN
40.0000 mg | Freq: Once | INTRAMUSCULAR | Status: AC
  Administered 2014-12-01: 40 mg via INTRAVENOUS
  Filled 2014-12-01: qty 4

## 2014-12-01 NOTE — Progress Notes (Signed)
Subjective:  No chest pain  Objective:  Vital Signs in the last 24 hours: Temp:  [97.6 F (36.4 C)-98.4 F (36.9 C)] 98.4 F (36.9 C) (05/09 1112) Pulse Rate:  [26-91] 26 (05/09 1000) Resp:  [13-23] 17 (05/09 1000) BP: (86-116)/(45-88) 105/58 mmHg (05/09 1000) SpO2:  [86 %-100 %] 94 % (05/09 1000) Weight:  [235 lb 3.7 oz (106.7 kg)] 235 lb 3.7 oz (106.7 kg) (05/09 0500)  Intake/Output from previous day:  Intake/Output Summary (Last 24 hours) at 12/01/14 1122 Last data filed at 12/01/14 1000  Gross per 24 hour  Intake 1541.15 ml  Output   1900 ml  Net -358.85 ml    Physical Exam: General appearance: alert, cooperative, no distress and moderately obese Lungs: decreased breath sounds Lt base Heart: regular rate and rhythm Skin: pale cool dry Neurologic: Grossly normal   Rate: 78  Rhythm: normal sinus rhythm PSVT runs  Lab Results:  Recent Labs  11/30/14 0100 12/01/14 0440  WBC 7.9 6.6  HGB 7.5* 7.2*  PLT 250 238    Recent Labs  11/30/14 0100 12/01/14 0440  NA 134* 133*  K 4.2 4.2  CL 101 101  CO2 22 22  GLUCOSE 131* 125*  BUN 42* 39*  CREATININE 2.23* 2.11*    Recent Labs  11/28/14 1237  TROPONINI 1.58*   No results for input(s): INR in the last 72 hours.  Scheduled Meds: . sodium chloride   Intravenous Once  . antiseptic oral rinse  7 mL Mouth Rinse q12n4p  . aspirin EC  81 mg Oral Daily  . atorvastatin  40 mg Oral q1800  . chlorhexidine  15 mL Mouth Rinse BID  . clopidogrel  75 mg Oral Daily  . DULoxetine  60 mg Oral BID  . enoxaparin (LOVENOX) injection  50 mg Subcutaneous Q24H  . famotidine  20 mg Oral QHS  . furosemide  40 mg Intravenous Once  . insulin aspart  0-15 Units Subcutaneous TID AC & HS  . mupirocin cream   Topical Daily  . rOPINIRole  2 mg Oral QHS  . vancomycin  125 mg Oral QID   Continuous Infusions: . sodium chloride     PRN Meds:.sodium chloride, heparin   Imaging: Imaging results have been  reviewed  Cardiac Studies: Echo 11/27/14 Study Conclusions  - Left ventricle: The cavity size was normal. Wall thickness was increased in a pattern of mild LVH. The estimated ejection fraction was 30%. Severe inferoseptal and inferior hypokinesis, inferolateral akinesis. Features are consistent with a pseudonormal left ventricular filling pattern, with concomitant abnormal relaxation and increased filling pressure (grade 2 diastolic dysfunction). - Ventricular septum: D-shaped interventricular septum suggests RV pressure/volume overload. - Aortic valve: There was no stenosis. There was mild regurgitation. - Mitral valve: There was moderate regurgitation. Suspect infarct-related MR with inferior and inferolateral wall motion abnormalities. - Right ventricle: The cavity size was moderately to severely dilated. Systolic function was moderately reduced. - Pulmonary arteries: PA peak pressure: 60 mm Hg (S). - Systemic veins: IVC measured 2.4 cm with < 50% respirophasic variation, suggesting RA pressure 15 mmHg. - Pericardium, extracardiac: A trivial pericardial effusion was identified.  Impressions:  - Normal LV size with mild LV hypertrophy. EF 30% with inferoseptal and inferior severe hypokinesis and inferolateral akinesis. Moderate diastolic dysfunction. Moderate to severely dilated RV with mioderately decreased systolic function. D-shaped interventricular septum suggestive of RV pressure/volume overload. Moderate pulmonary hypertension  Assessment/Plan:  69 yo female with PMH of HTN, DM2, CAD (  PCI to LCx in 2008 and April 2012), she has been in and out of the hospital since March. She has had acute on chronic renal failure more than once in that time. She was discharged from rehab 11/24/14 and re admitted 11/27/14 with septic shock secondary to C.Diff complicated by NSTEMI with a Troponin of 7 and new LVD with an EF of 30% with inferior and inferior  lat HK. She again developed acute renal failure requiring transient dialysis but her renal function has gradually improved.   Principal Problem:   Septic shock Active Problems:   Acute on chronic combined systolic and diastolic congestive heart failure   NSTEMI (non-ST elevated myocardial infarction)   Acute renal failure with tubular necrosis   C. difficile colitis   CAD (coronary artery disease)   Diabetes mellitus type 2 with complications   CKD (chronic kidney disease) stage 3, GFR 30-59 ml/min   PSVT (paroxysmal supraventricular tachycardia)   Pulmonary HTN   HLD (hyperlipidemia)   PLAN: Plan is for cath at some point. We suspect she has re-stenosed her CFX. She has not been out of bed yet. She has no chest pain. Consider transfusion with CAD and NSTEMI. Timing of cath to be determined based on her clinical progress and Scr. Start low dose beta blocker ( I discussed with Dr Darrol AngelSimons).   Smith InternationalLuke Kullen Tomasetti PA-C 12/01/2014, 11:22 AM [phone]

## 2014-12-01 NOTE — Progress Notes (Signed)
Utilization review completed.  
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
## 2014-12-01 NOTE — Progress Notes (Addendum)
ANTICOAGULATION CONSULT NOTE - FOLLOW UP  Pharmacy Consult:  Heparin Indication: NSTEMI  No Known Allergies  Patient Measurements: Height: 5\' 6"  (167.6 cm) Weight: 235 lb 3.7 oz (106.7 kg) IBW/kg (Calculated) : 59.3 Heparin Dosing Weight: 83 kg  Vital Signs: Temp: 97.8 F (36.6 C) (05/09 0426) Temp Source: Oral (05/09 0426) BP: 94/63 mmHg (05/09 0600) Pulse Rate: 81 (05/09 0700)  Labs:  Recent Labs  11/28/14 1237  11/29/14 0400  11/30/14 0100 11/30/14 0119 11/30/14 0707 12/01/14 0440  HGB  --   < > 7.5*  --  7.5*  --   --  7.2*  HCT  --   --  24.5*  --  24.7*  --   --  23.7*  PLT  --   --  249  --  250  --   --  238  HEPARINUNFRC  --   --  0.26*  < >  --  0.32 0.51 0.40  CREATININE  --   --  2.33*  --  2.23*  --   --  2.11*  TROPONINI 1.58*  --   --   --   --   --   --   --   < > = values in this interval not displayed.  Estimated Creatinine Clearance: 32.4 mL/min (by C-G formula based on Cr of 2.11).    Assessment: [age] YOF readmitted 11/26/14 from Rehab due generalized weakness/fall and continued diarrhea.  Pharmacy managing IV heparin for NSTEMI.  Heparin level therapeutic; no bleeding reported.   Goal of Therapy:  Heparin level 0.3-0.7 units/ml Monitor platelets by anticoagulation protocol: Yes   Plan:  - Continue heparin gtt at 1650 units/hr - Daily HL / CBC   Riva Sesma D. Laney Potashang, PharmD, BCPS Pager:  [phone] 12/01/2014, 10:09 AM    =======================   Addendum: - change from therapeutic heparin to Lovenox for VTE px - BMI 38 - borderline CrCL    Plan: - Lovenox 50mg  SQ Q24H (~0.5 mg/kg) - Pharmacy will sign off and follow peripherally.  Thank you for the consult!   Tregan Read D. Laney Potashang, PharmD, BCPS Pager:  [phone] 12/01/2014, 10:20 AM

## 2014-12-01 NOTE — Care Management Note (Signed)
Case Management Note  Patient Details  Name: Theodoro KalataMary O Boese MRN: [id number] Date of Birth: 08-Apr-1946  Subjective/Objective:  Pt admitted wth septic shock                  Action/Plan:  PTA pt lived at home with spouse- NCM to follow for d/c needs  Expected Discharge Date:               Expected Discharge Plan:  Long Term Acute Care (LTAC)  In-House Referral:     Discharge planning Services     Post Acute Care Choice:    Choice offered to:     DME Arranged:    DME Agency:     HH Arranged:    HH Agency:     Status of Service:  In process, will continue to follow  Medicare Important Message Given:    Date Medicare IM Given:    Medicare IM give by:    Date Additional Medicare IM Given:    Additional Medicare Important Message give by:     If discussed at Long Length of Stay Meetings, dates discussed:  12/02/14  Additional Comments:  Darrold SpanWebster, Jamekia Gannett Hall, RN 12/01/2014, 4:31 PM

## 2014-12-02 ENCOUNTER — Inpatient Hospital Stay (HOSPITAL_COMMUNITY): Payer: Medicare Other

## 2014-12-02 DIAGNOSIS — D638 Anemia in other chronic diseases classified elsewhere: Secondary | ICD-10-CM | POA: Diagnosis present

## 2014-12-02 DIAGNOSIS — N189 Chronic kidney disease, unspecified: Secondary | ICD-10-CM | POA: Diagnosis present

## 2014-12-02 DIAGNOSIS — I272 Pulmonary hypertension, unspecified: Secondary | ICD-10-CM | POA: Diagnosis present

## 2014-12-02 DIAGNOSIS — F329 Major depressive disorder, single episode, unspecified: Secondary | ICD-10-CM

## 2014-12-02 DIAGNOSIS — G2581 Restless legs syndrome: Secondary | ICD-10-CM

## 2014-12-02 DIAGNOSIS — L989 Disorder of the skin and subcutaneous tissue, unspecified: Secondary | ICD-10-CM

## 2014-12-02 DIAGNOSIS — N183 Chronic kidney disease, stage 3 (moderate): Secondary | ICD-10-CM

## 2014-12-02 DIAGNOSIS — N179 Acute kidney failure, unspecified: Secondary | ICD-10-CM | POA: Diagnosis present

## 2014-12-02 DIAGNOSIS — E1122 Type 2 diabetes mellitus with diabetic chronic kidney disease: Secondary | ICD-10-CM

## 2014-12-02 LAB — BASIC METABOLIC PANEL
ANION GAP: 11 (ref 5–15)
BUN: 39 mg/dL — AB (ref 6–20)
CHLORIDE: 101 mmol/L (ref 101–111)
CO2: 20 mmol/L — ABNORMAL LOW (ref 22–32)
Calcium: 8.6 mg/dL — ABNORMAL LOW (ref 8.9–10.3)
Creatinine, Ser: 2.04 mg/dL — ABNORMAL HIGH (ref 0.44–1.00)
GFR calc non Af Amer: 24 mL/min — ABNORMAL LOW (ref >60)
GFR, EST AFRICAN AMERICAN: 28 mL/min — AB (ref >60)
Glucose, Bld: 128 mg/dL — ABNORMAL HIGH (ref 70–99)
Potassium: 4.5 mmol/L (ref 3.5–5.1)
Sodium: 132 mmol/L — ABNORMAL LOW (ref 135–145)

## 2014-12-02 LAB — CULTURE, BLOOD (ROUTINE X 2)
Culture: NO GROWTH
Culture: NO GROWTH

## 2014-12-02 LAB — GLUCOSE, CAPILLARY
GLUCOSE-CAPILLARY: 126 mg/dL — AB (ref 70–99)
GLUCOSE-CAPILLARY: 195 mg/dL — AB (ref 70–99)
GLUCOSE-CAPILLARY: 160 mg/dL — AB (ref 70–99)
Glucose-Capillary: 172 mg/dL — ABNORMAL HIGH (ref 70–99)
Glucose-Capillary: 166 mg/dL — ABNORMAL HIGH (ref 70–99)

## 2014-12-02 LAB — CBC
HEMATOCRIT: 28.3 % — AB (ref 36.0–46.0)
Hemoglobin: 8.6 g/dL — ABNORMAL LOW (ref 12.0–15.0)
MCH: 26.2 pg (ref 26.0–34.0)
MCHC: 30.4 g/dL (ref 30.0–36.0)
MCV: 86.3 fL (ref 78.0–100.0)
Platelets: 262 10*3/uL (ref 150–400)
RBC: 3.28 MIL/uL — ABNORMAL LOW (ref 3.87–5.11)
RDW: 20.1 % — ABNORMAL HIGH (ref 11.5–15.5)
WBC: 7.8 10*3/uL (ref 4.0–10.5)

## 2014-12-02 MED ORDER — ACETAMINOPHEN 325 MG PO TABS
650.0000 mg | ORAL_TABLET | Freq: Four times a day (QID) | ORAL | Status: DC | PRN
  Administered 2014-12-02 – 2014-12-06 (×5): 650 mg via ORAL
  Filled 2014-12-02 (×5): qty 2

## 2014-12-02 NOTE — Progress Notes (Signed)
Rehab Admissions Coordinator Note:  Patient was screened by Trish MageLogue, Roya Gieselman M for appropriateness for an Inpatient Acute Rehab Consult.  At this time, we are recommending Inpatient Rehab consult.  Lelon FrohlichLogue, Shulem Mader M 12/02/2014, 10:00 AM  I can be reached at [phone].
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
## 2014-12-03 ENCOUNTER — Inpatient Hospital Stay (HOSPITAL_COMMUNITY): Payer: Medicare Other

## 2014-12-03 DIAGNOSIS — E118 Type 2 diabetes mellitus with unspecified complications: Secondary | ICD-10-CM

## 2014-12-03 DIAGNOSIS — N170 Acute kidney failure with tubular necrosis: Secondary | ICD-10-CM

## 2014-12-03 DIAGNOSIS — D638 Anemia in other chronic diseases classified elsewhere: Secondary | ICD-10-CM

## 2014-12-03 LAB — CBC WITH DIFFERENTIAL/PLATELET
BASOS ABS: 0.1 10*3/uL (ref 0.0–0.1)
Basophils Relative: 1 % (ref 0–1)
Eosinophils Absolute: 0.3 10*3/uL (ref 0.0–0.7)
Eosinophils Relative: 3 % (ref 0–5)
HEMATOCRIT: 29.4 % — AB (ref 36.0–46.0)
HEMOGLOBIN: 9.2 g/dL — AB (ref 12.0–15.0)
LYMPHS ABS: 1.3 10*3/uL (ref 0.7–4.0)
Lymphocytes Relative: 14 % (ref 12–46)
MCH: 26.7 pg (ref 26.0–34.0)
MCHC: 31.3 g/dL (ref 30.0–36.0)
MCV: 85.2 fL (ref 78.0–100.0)
Monocytes Absolute: 1 10*3/uL (ref 0.1–1.0)
Monocytes Relative: 11 % (ref 3–12)
NEUTROS ABS: 6.3 10*3/uL (ref 1.7–7.7)
Neutrophils Relative %: 71 % (ref 43–77)
Platelets: 346 10*3/uL (ref 150–400)
RBC: 3.45 MIL/uL — ABNORMAL LOW (ref 3.87–5.11)
RDW: 20.6 % — AB (ref 11.5–15.5)
WBC: 9 10*3/uL (ref 4.0–10.5)

## 2014-12-03 LAB — MAGNESIUM: MAGNESIUM: 1.5 mg/dL — AB (ref 1.7–2.4)

## 2014-12-03 LAB — COMPREHENSIVE METABOLIC PANEL
ALK PHOS: 110 U/L (ref 38–126)
ALT: 82 U/L — AB (ref 14–54)
ANION GAP: 12 (ref 5–15)
AST: 31 U/L (ref 15–41)
Albumin: 2.3 g/dL — ABNORMAL LOW (ref 3.5–5.0)
BILIRUBIN TOTAL: 0.7 mg/dL (ref 0.3–1.2)
BUN: 43 mg/dL — ABNORMAL HIGH (ref 6–20)
CALCIUM: 8.8 mg/dL — AB (ref 8.9–10.3)
CO2: 21 mmol/L — AB (ref 22–32)
CREATININE: 2.25 mg/dL — AB (ref 0.44–1.00)
Chloride: 102 mmol/L (ref 101–111)
GFR, EST AFRICAN AMERICAN: 25 mL/min — AB (ref >60)
GFR, EST NON AFRICAN AMERICAN: 22 mL/min — AB (ref >60)
GLUCOSE: 168 mg/dL — AB (ref 70–99)
Potassium: 4.8 mmol/L (ref 3.5–5.1)
SODIUM: 135 mmol/L (ref 135–145)
TOTAL PROTEIN: 5.6 g/dL — AB (ref 6.5–8.1)

## 2014-12-03 LAB — GLUCOSE, CAPILLARY
GLUCOSE-CAPILLARY: 179 mg/dL — AB (ref 70–99)
Glucose-Capillary: 147 mg/dL — ABNORMAL HIGH (ref 70–99)
Glucose-Capillary: 169 mg/dL — ABNORMAL HIGH (ref 70–99)
Glucose-Capillary: 133 mg/dL — ABNORMAL HIGH (ref 70–99)

## 2014-12-03 MED ORDER — MAGNESIUM SULFATE 2 GM/50ML IV SOLN: 2.0000 g | Freq: Once | INTRAVENOUS | Status: DC

## 2014-12-03 MED ORDER — MAGNESIUM CHLORIDE 64 MG PO TBEC
1.0000 | DELAYED_RELEASE_TABLET | Freq: Every day | ORAL | Status: DC
  Administered 2014-12-03 – 2014-12-05 (×3): 64 mg via ORAL
  Filled 2014-12-03 (×5): qty 1

## 2014-12-03 MED ORDER — FUROSEMIDE 40 MG PO TABS
40.0000 mg | ORAL_TABLET | Freq: Every day | ORAL | Status: DC
  Administered 2014-12-03 – 2014-12-05 (×3): 40 mg via ORAL
  Filled 2014-12-03: qty 2
  Filled 2014-12-03 (×2): qty 1

## 2014-12-03 MED ORDER — MUPIROCIN 2 % EX OINT
TOPICAL_OINTMENT | Freq: Every day | CUTANEOUS | Status: DC
  Administered 2014-12-03 – 2014-12-06 (×4): via TOPICAL

## 2014-12-03 NOTE — Progress Notes (Addendum)
I met with pt and her spouse at bedside. We discussed Dr. Letta Pate recommendations of SNF rehab vs inpt rehab pending her tolerance for more intensive therapies. Pt was admitted to inpt rehab 4/22 and discharged 5/3 at a supervision level ambulating 150 feet. Spouse reports pt developed diarrhea once home and fell which pt then readmitted 5/4. Pt and spouse prefer an inpt rehab admission rather than SNF. I discussed the need for pt to demonstrate her ability to do more therapy by sitting in recliner and  working with therapy tomorrow to increase her activity level. Pt states she will try. Karene Fry, Admissions Coordinator, will follow pt in my absence Thursday and Friday. If pt demonstrates better tolerance with therapies, we can discuss with Dr. Naaman Plummer the possibility of readmission.Pt and spouse in agreement. I have discussed with SW. Genie can be reached at [phone].placed order for OT eval and requested PT follow up in next 24 hrs to assist in demonstrating tolerance for more therapies.
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
## 2014-12-03 NOTE — Progress Notes (Signed)
Pt request to sit in recliner. Two RN's tried to help patient to recliner, however, patient not wanting to lift her legs, and unable to pull herself to a sitting position, even with assistance. We did not get her to the recliner at this time, as it was very unsafe. Put bed in chair position for now, will continue to monitor, and try and get patient to the chair.
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
## 2014-12-03 NOTE — Progress Notes (Signed)
Foley removed at 1150, will continue to monitor pt for adequate output.

## 2014-12-03 NOTE — Progress Notes (Signed)
Medicare Important Message given? YES   (If response is "NO", the following Medicare IM given date fields will be blank)   Date Medicare IM given:   Medicare IM given by: Graves-Bigelow, Symeon Puleo  
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
## 2014-12-04 ENCOUNTER — Other Ambulatory Visit: Payer: Self-pay | Admitting: Physician Assistant

## 2014-12-04 DIAGNOSIS — N189 Chronic kidney disease, unspecified: Secondary | ICD-10-CM

## 2014-12-04 DIAGNOSIS — N289 Disorder of kidney and ureter, unspecified: Principal | ICD-10-CM

## 2014-12-04 LAB — GLUCOSE, CAPILLARY
Glucose-Capillary: 130 mg/dL — ABNORMAL HIGH (ref 65–99)
Glucose-Capillary: 181 mg/dL — ABNORMAL HIGH (ref 65–99)
Glucose-Capillary: 152 mg/dL — ABNORMAL HIGH (ref 65–99)
Glucose-Capillary: 139 mg/dL — ABNORMAL HIGH (ref 65–99)

## 2014-12-04 MED ORDER — NEPRO/CARBSTEADY PO LIQD
237.0000 mL | Freq: Two times a day (BID) | ORAL | Status: DC
  Administered 2014-12-06: 237 mL via ORAL
  Filled 2014-12-04 (×6): qty 237

## 2014-12-04 MED ORDER — ENSURE ENLIVE PO LIQD: 237.0000 mL | Freq: Two times a day (BID) | ORAL | Status: DC
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
## 2014-12-04 NOTE — Progress Notes (Signed)
RT placed pt on auto titrate CPAP 6-18cmH2O with 3Lpm of oxygen bled in via ffm. Pt states she is comfortable and is tolerating CPAP well at this time. RT will continue to monitor as needed.
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
## 2014-12-04 NOTE — Progress Notes (Signed)
Placed patient on CPAP via FFM, auto titrate settings (min 6.0, max 18.0) with 2 lpm O2 bleed in.  Patient tolerating well at this time. RN aware.

## 2014-12-04 NOTE — Consult Note (Signed)
   [facility] CM Inpatient Consult   12/04/2014  Krista Wise 1946/01/05 [id number] Patient on the Long length of stay. Met with the patient and husband Krista Wise at bedside about [name] services.  Explained how care management services could assist the patient as a benefit of her Medicare.  He states that he believes that she has a long road ahead in getting her strength back before she gets home.  He wanted the brochure and contact information for follow up. He did not sign the consent for care management. Encouraged him to call if he has further questions and this writer could follow up as needed.  Will continue to follow progress and needs. For questions, please contact: Natividad Brood, RN BSN [facility] Liaison  [phone] business mobile phone
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
## 2014-12-05 LAB — TYPE AND SCREEN
ABO/RH(D): O POS
ANTIBODY SCREEN: POSITIVE
DAT, IgG: NEGATIVE
PT AG Type: NEGATIVE
UNIT DIVISION: 0
Unit division: 0

## 2014-12-05 LAB — BASIC METABOLIC PANEL
Anion gap: 11 (ref 5–15)
BUN: 43 mg/dL — ABNORMAL HIGH (ref 6–20)
CALCIUM: 8.9 mg/dL (ref 8.9–10.3)
CO2: 24 mmol/L (ref 22–32)
Chloride: 101 mmol/L (ref 101–111)
Creatinine, Ser: 2.23 mg/dL — ABNORMAL HIGH (ref 0.44–1.00)
GFR calc Af Amer: 25 mL/min — ABNORMAL LOW (ref >60)
GFR calc non Af Amer: 22 mL/min — ABNORMAL LOW (ref >60)
GLUCOSE: 139 mg/dL — AB (ref 65–99)
Potassium: 4.5 mmol/L (ref 3.5–5.1)
SODIUM: 136 mmol/L (ref 135–145)

## 2014-12-05 LAB — CBC
HEMATOCRIT: 30.3 % — AB (ref 36.0–46.0)
Hemoglobin: 9 g/dL — ABNORMAL LOW (ref 12.0–15.0)
MCH: 25.5 pg — ABNORMAL LOW (ref 26.0–34.0)
MCHC: 29.7 g/dL — ABNORMAL LOW (ref 30.0–36.0)
MCV: 85.8 fL (ref 78.0–100.0)
Platelets: 305 10*3/uL (ref 150–400)
RBC: 3.53 MIL/uL — AB (ref 3.87–5.11)
RDW: 20.5 % — ABNORMAL HIGH (ref 11.5–15.5)
WBC: 8.2 10*3/uL (ref 4.0–10.5)

## 2014-12-05 LAB — GLUCOSE, CAPILLARY
Glucose-Capillary: 139 mg/dL — ABNORMAL HIGH (ref 65–99)
Glucose-Capillary: 150 mg/dL — ABNORMAL HIGH (ref 65–99)
Glucose-Capillary: 158 mg/dL — ABNORMAL HIGH (ref 65–99)
Glucose-Capillary: 179 mg/dL — ABNORMAL HIGH (ref 65–99)

## 2014-12-05 MED ORDER — FERROUS SULFATE 325 (65 FE) MG PO TABS
325.0000 mg | ORAL_TABLET | Freq: Every day | ORAL | Status: DC
  Administered 2014-12-06: 325 mg via ORAL
  Filled 2014-12-05: qty 1

## 2014-12-05 MED ORDER — FUROSEMIDE 40 MG PO TABS: 40.0000 mg | ORAL_TABLET | Freq: Two times a day (BID) | ORAL | Status: DC

## 2014-12-05 MED ORDER — TORSEMIDE 20 MG PO TABS
60.0000 mg | ORAL_TABLET | Freq: Every day | ORAL | Status: DC
  Administered 2014-12-05 – 2014-12-06 (×2): 60 mg via ORAL
  Filled 2014-12-05 (×2): qty 3

## 2014-12-05 NOTE — Clinical Social Work Placement (Signed)
   CLINICAL SOCIAL WORK PLACEMENT  NOTE  Date:  12/05/2014  Patient Details  Name: Krista Wise MRN: [id number] Date of Birth: [birth year]  Clinical Social Work is seeking post-discharge placement for this patient at the Skilled  Nursing Facility level of care (*CSW will initial, date and re-position this form in  chart as items are completed):  Yes   Patient/family provided with [place] Clinical Social Work Department's list of facilities offering this level of care within the geographic area requested by the patient (or if unable, by the patient's family).  Yes   Patient/family informed of their freedom to choose among providers that offer the needed level of care, that participate in Medicare, Medicaid or managed care program needed by the patient, have an available bed and are willing to accept the patient.  Yes   Patient/family informed of [facility]'s ownership interest [facility] and [facility], as well as of the fact that they are under no obligation to receive care at these facilities.  PASRR submitted to EDS on       PASRR number received on       Existing PASRR number confirmed on 12/05/14     FL2 transmitted to all facilities in geographic area requested by pt/family on 12/05/14     FL2 transmitted to all facilities within larger geographic area on       Patient informed that his/her managed care company has contracts with or will negotiate with certain facilities, including the following:            Patient/family informed of bed offers received.  Patient chooses bed at       Physician recommends and patient chooses bed at      Patient to be transferred to   on  .  Patient to be transferred to facility by       Patient family notified on   of transfer.  Name of family member notified:        PHYSICIAN Please sign FL2, Please prepare priority discharge summary, including medications, Please prepare prescriptions     Additional Comment:     _______________________________________________ Sharol HarnessPoonum Bardia Wangerin, Theresia MajorsLCSWA [phone]
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
## 2014-12-05 NOTE — Progress Notes (Signed)
Medicare Important Message given? YES   (If response is "NO", the following Medicare IM given date fields will be blank)   Date Medicare IM given:   Medicare IM given by: Graves-Bigelow, Kelseigh Diver  

## 2014-12-05 NOTE — Clinical Social Work Note (Signed)
Clinical Social Work Assessment  Patient Details  Name: Krista Wise MRN: [id number] Date of Birth: 1946/02/13  Date of referral:  12/05/14               Reason for consult:  Facility Placement, Discharge Planning                Permission sought to share information with:  Oceanographeracility Contact Representative Permission granted to share information::  Yes, Verbal Permission Granted  Name::        Agency::  [place] for referral purpose  Relationship::     Contact Information:     Housing/Transportation Living arrangements for the past 2 months:  Single Family Home Source of Information:  Patient, Spouse Patient Interpreter Needed:  None Criminal Activity/Legal Involvement Pertinent to Current Situation/Hospitalization:  No - Comment as needed Significant Relationships:  Spouse Lives with:  Spouse Do you feel safe going back to the place where you live?  Yes (Pt safe at home but aware that her husband cannot manage on his own) Need for family participation in patient care:  No (Coment)  Care giving concerns:  Pt not a good candidate for CIR at this time; recommendation is SNF  Social Worker assessment / plan:  CSW spoke with pt and pt husband at bedside. They are both verbal in their hesitation towards SNF. Pt husband states understanding that he cannot manage pt care at home alone, but both pt and pt husband would prefer pt to be at home. Pt has been to [facility] in the past. Pt did not express issues with this SNF in particular but just SNF environment in general. Pt is agreeable to referral being sent out and is aware of potential for weekend dc, but pt still unsure of dc plan.   Employment status:    Insurance information:  Medicare PT Recommendations:  Inpatient Rehab Consult Information / Referral to community resources:  Skilled Nursing Facility  Patient/Family's Response to care:  Pt and pt husband understanding of plan but unsure if they want SNF at this  time.  Patient/Family's Understanding of and Emotional Response to Diagnosis, Current Treatment, and Prognosis:  Pt unhappy during CSW conversation. She was hopeful that she would be able to dc to CIR and upset that this will not work out. Pt and pt husband with appropriate emotional response to situation.  Emotional Assessment Appearance:  Well-Groomed Attitude/Demeanor/Rapport:    Affect (typically observed):  Calm, Sad, Quiet Orientation:  Oriented to Self, Oriented to Place, Oriented to  Time, Oriented to Situation Alcohol / Substance use:  Not Applicable Psych involvement (Current and /or in the community):  No (Comment)  Discharge Needs  Concerns to be addressed:  Discharge Planning Concerns Readmission within the last 30 days:  No Current discharge risk:  Dependent with Mobility Barriers to Discharge:  Continued Medical Work up    [employer] Nikolina Simerson, [employer]CSWA [phone]

## 2014-12-05 NOTE — Progress Notes (Signed)
Rehab admissions -I spoke with rehab MD, Dr. Riley KillSwartz, who reviewed this case. Patient is not able to tolerate acute inpatient rehab therapies at this time.  I will have my partner follow up on Monday if patient is still in the hospital.  Call me for questions.  #[phone]

## 2014-12-05 NOTE — Progress Notes (Signed)
RT arrived to place PT on BIPAP QHS. When pt was asked if she was ready to start her BIPAP for tonight she stated that her MD stated that she did not have to wear it anymore. I looked in the patients notes and told her I saw a note that we were to take her off her oxygen [place] but no note about BIPAP QHS. She stated she talked to MD today about no liking out machine and was told she did not have to wear. RT explained the importance of the therapy and pt refused again. PT is on a continuous pulse ox. RT will monitor. This will be passed on to dayshift for clarification.

## 2014-12-06 LAB — GLUCOSE, CAPILLARY
Glucose-Capillary: 186 mg/dL — ABNORMAL HIGH (ref 65–99)
Glucose-Capillary: 208 mg/dL — ABNORMAL HIGH (ref 65–99)

## 2014-12-06 MED ORDER — ACETAMINOPHEN 325 MG PO TABS: 650.0000 mg | ORAL_TABLET | Freq: Four times a day (QID) | ORAL | Status: AC | PRN

## 2014-12-06 MED ORDER — MAGNESIUM CHLORIDE 64 MG PO TBEC: 1.0000 | DELAYED_RELEASE_TABLET | Freq: Every day | ORAL | Status: AC

## 2014-12-06 MED ORDER — CLOPIDOGREL BISULFATE 75 MG PO TABS: 75.0000 mg | ORAL_TABLET | Freq: Every day | ORAL | Status: AC

## 2014-12-06 MED ORDER — GLIMEPIRIDE 1 MG PO TABS: 1.0000 mg | ORAL_TABLET | Freq: Every day | ORAL | Status: AC

## 2014-12-06 MED ORDER — VANCOMYCIN 50 MG/ML ORAL SOLUTION
125.0000 mg | Freq: Four times a day (QID) | ORAL | Status: DC
Start: 2014-12-06 — End: 2014-12-16

## 2014-12-06 NOTE — Clinical Social Work Placement (Signed)
   CLINICAL SOCIAL WORK PLACEMENT  NOTE  Date:  12/06/2014  Patient Details  Name: Krista Wise MRN: [id number] Date of Birth: 03-Apr-1946  Clinical Social Work is seeking post-discharge placement for this patient at the Skilled  Nursing Facility level of care (*CSW will initial, date and re-position this form in  chart as items are completed):  Yes   Patient/family provided with [place] Clinical Social Work Department's list of facilities offering this level of care within the geographic area requested by the patient (or if unable, by the patient's family).  Yes   Patient/family informed of their freedom to choose among providers that offer the needed level of care, that participate in Medicare, Medicaid or managed care program needed by the patient, have an available bed and are willing to accept the patient.  Yes   Patient/family informed of [facility]'s ownership interest in [facility] and [facility], as well as of the fact that they are under no obligation to receive care at these facilities.  PASRR submitted to EDS on       PASRR number received on       Existing PASRR number confirmed on 12/05/14     FL2 transmitted to all facilities in geographic area requested by pt/family on 12/05/14     FL2 transmitted to all facilities within larger geographic area on       Patient informed that his/her managed care company has contracts with or will negotiate with certain facilities, including the following:            Patient/family informed of bed offers received.  [facility] bed at [facility]     Physician recommends and patient chooses bed at  (none)    Patient to be transferred to [facility] on 12/06/14.  Patient to be transferred to facility by [facility]     Patient family notified on 12/06/14 of transfer.  Name of family member notified:  husband     PHYSICIAN Please sign FL2, Please prepare priority discharge summary, including medications, Please  prepare prescriptions     Additional Comment:    _______________________________________________ Rondel BatonIngle, Kyelle Urbas C, LCSW 12/06/2014, 12:23 PM

## 2014-12-06 NOTE — Clinical Social Work Note (Addendum)
CSW met with patient to review bed offers.  Patient reports husband, Herbie Baltimore will be making the placement decisions.  Husband not at bedside. CSW contacted Robert at [phone].  Herbie Baltimore states he will tour the facilities ([place]) and contact CSW with a SNF decision.  CSW also contacted [facility] SNF to inquire if they would reconsider accepting the patient this weekend.  Message was left.  CSW awaiting a return call from [place] and from husband to determine SN facility at time of discharge.  CSW provided education to patient and husband regarding discharge.  Safe discharge has been provided.  Once patient medically cleared, patient will be discharged and a facility will need to be chosen.  Husband and patient aware.  Nonnie Done, [place] Weekend CSW coverage
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
## 2014-12-06 NOTE — Clinical Social Work Note (Signed)
Patient will discharge today per MD order. [facility] RN to call report prior to transportation to: [phone] Transportation: [facility]- scheduled for 1:30p  CSW sent discharge summary to SNF for review.  Packet is complete.  RN, patient and family aware of discharge plans.  Vickii PennaGina Labrina Lines, LCSWA [phone]  Psychiatric & Orthopedics (5N 1-16) Clinical Social Worker

## 2014-12-08 ENCOUNTER — Non-Acute Institutional Stay (SKILLED_NURSING_FACILITY): Payer: Medicare Other | Admitting: Registered Nurse

## 2014-12-08 ENCOUNTER — Encounter: Payer: Self-pay | Admitting: Registered Nurse

## 2014-12-08 DIAGNOSIS — E785 Hyperlipidemia, unspecified: Secondary | ICD-10-CM

## 2014-12-08 DIAGNOSIS — G4733 Obstructive sleep apnea (adult) (pediatric): Secondary | ICD-10-CM | POA: Diagnosis not present

## 2014-12-08 DIAGNOSIS — I5043 Acute on chronic combined systolic (congestive) and diastolic (congestive) heart failure: Secondary | ICD-10-CM

## 2014-12-08 DIAGNOSIS — G2581 Restless legs syndrome: Secondary | ICD-10-CM

## 2014-12-08 DIAGNOSIS — N179 Acute kidney failure, unspecified: Secondary | ICD-10-CM | POA: Diagnosis not present

## 2014-12-08 DIAGNOSIS — T25022S Burn of unspecified degree of left foot, sequela: Secondary | ICD-10-CM

## 2014-12-08 DIAGNOSIS — I10 Essential (primary) hypertension: Secondary | ICD-10-CM

## 2014-12-08 DIAGNOSIS — D638 Anemia in other chronic diseases classified elsewhere: Secondary | ICD-10-CM

## 2014-12-08 DIAGNOSIS — R5381 Other malaise: Secondary | ICD-10-CM

## 2014-12-08 DIAGNOSIS — A047 Enterocolitis due to Clostridium difficile: Secondary | ICD-10-CM

## 2014-12-08 DIAGNOSIS — R6521 Severe sepsis with septic shock: Secondary | ICD-10-CM

## 2014-12-08 DIAGNOSIS — M545 Low back pain, unspecified: Secondary | ICD-10-CM

## 2014-12-08 DIAGNOSIS — A419 Sepsis, unspecified organism: Secondary | ICD-10-CM | POA: Diagnosis not present

## 2014-12-08 DIAGNOSIS — N189 Chronic kidney disease, unspecified: Secondary | ICD-10-CM

## 2014-12-08 DIAGNOSIS — I214 Non-ST elevation (NSTEMI) myocardial infarction: Secondary | ICD-10-CM | POA: Diagnosis not present

## 2014-12-08 DIAGNOSIS — E118 Type 2 diabetes mellitus with unspecified complications: Secondary | ICD-10-CM | POA: Diagnosis not present

## 2014-12-08 DIAGNOSIS — A0472 Enterocolitis due to Clostridium difficile, not specified as recurrent: Secondary | ICD-10-CM
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
## 2014-12-10 ENCOUNTER — Non-Acute Institutional Stay (SKILLED_NURSING_FACILITY): Payer: Medicare Other | Admitting: Internal Medicine

## 2014-12-10 DIAGNOSIS — B373 Candidiasis of vulva and vagina: Secondary | ICD-10-CM | POA: Diagnosis not present

## 2014-12-10 DIAGNOSIS — I5033 Acute on chronic diastolic (congestive) heart failure: Secondary | ICD-10-CM

## 2014-12-10 DIAGNOSIS — T25022S Burn of unspecified degree of left foot, sequela: Secondary | ICD-10-CM

## 2014-12-10 DIAGNOSIS — I10 Essential (primary) hypertension: Secondary | ICD-10-CM

## 2014-12-10 DIAGNOSIS — D638 Anemia in other chronic diseases classified elsewhere: Secondary | ICD-10-CM | POA: Diagnosis not present

## 2014-12-10 DIAGNOSIS — E118 Type 2 diabetes mellitus with unspecified complications: Secondary | ICD-10-CM | POA: Diagnosis not present

## 2014-12-10 DIAGNOSIS — I214 Non-ST elevation (NSTEMI) myocardial infarction: Secondary | ICD-10-CM

## 2014-12-10 DIAGNOSIS — R5381 Other malaise: Secondary | ICD-10-CM | POA: Diagnosis not present

## 2014-12-10 DIAGNOSIS — B3731 Acute candidiasis of vulva and vagina: Secondary | ICD-10-CM

## 2014-12-10 DIAGNOSIS — N189 Chronic kidney disease, unspecified: Secondary | ICD-10-CM

## 2014-12-10 DIAGNOSIS — A047 Enterocolitis due to Clostridium difficile: Secondary | ICD-10-CM | POA: Diagnosis not present

## 2014-12-10 DIAGNOSIS — K1379 Other lesions of oral mucosa: Secondary | ICD-10-CM | POA: Diagnosis not present

## 2014-12-10 DIAGNOSIS — A0472 Enterocolitis due to Clostridium difficile, not specified as recurrent: Secondary | ICD-10-CM

## 2014-12-10 DIAGNOSIS — N179 Acute kidney failure, unspecified: Secondary | ICD-10-CM | POA: Diagnosis not present
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
## 2014-12-12 ENCOUNTER — Ambulatory Visit (HOSPITAL_COMMUNITY)
Admit: 2014-12-12 | Discharge: 2014-12-12 | Disposition: A | Discharge status: Home / Self Care | Payer: Medicare Other | Source: Ambulatory Visit | Attending: Cardiology | Admitting: Cardiology

## 2014-12-12 ENCOUNTER — Encounter (HOSPITAL_COMMUNITY): Payer: Self-pay

## 2014-12-12 VITALS — BP 118/70 | HR 74 | Wt 222.8 lb

## 2014-12-12 DIAGNOSIS — Z7982 Long term (current) use of aspirin: Secondary | ICD-10-CM | POA: Diagnosis not present

## 2014-12-12 DIAGNOSIS — I5042 Chronic combined systolic (congestive) and diastolic (congestive) heart failure: Secondary | ICD-10-CM

## 2014-12-12 DIAGNOSIS — Z79899 Other long term (current) drug therapy: Secondary | ICD-10-CM | POA: Diagnosis not present

## 2014-12-12 DIAGNOSIS — Z8249 Family history of ischemic heart disease and other diseases of the circulatory system: Secondary | ICD-10-CM | POA: Insufficient documentation

## 2014-12-12 DIAGNOSIS — N189 Chronic kidney disease, unspecified: Secondary | ICD-10-CM | POA: Insufficient documentation

## 2014-12-12 DIAGNOSIS — I129 Hypertensive chronic kidney disease with stage 1 through stage 4 chronic kidney disease, or unspecified chronic kidney disease: Secondary | ICD-10-CM | POA: Diagnosis not present

## 2014-12-12 DIAGNOSIS — E11319 Type 2 diabetes mellitus with unspecified diabetic retinopathy without macular edema: Secondary | ICD-10-CM | POA: Insufficient documentation

## 2014-12-12 DIAGNOSIS — N183 Chronic kidney disease, stage 3 unspecified: Secondary | ICD-10-CM

## 2014-12-12 DIAGNOSIS — I25110 Atherosclerotic heart disease of native coronary artery with unstable angina pectoris: Secondary | ICD-10-CM | POA: Insufficient documentation

## 2014-12-12 DIAGNOSIS — M797 Fibromyalgia: Secondary | ICD-10-CM | POA: Insufficient documentation

## 2014-12-12 DIAGNOSIS — I2510 Atherosclerotic heart disease of native coronary artery without angina pectoris: Secondary | ICD-10-CM | POA: Diagnosis not present

## 2014-12-12 DIAGNOSIS — Z7902 Long term (current) use of antithrombotics/antiplatelets: Secondary | ICD-10-CM | POA: Diagnosis not present

## 2014-12-12 DIAGNOSIS — G4733 Obstructive sleep apnea (adult) (pediatric): Secondary | ICD-10-CM | POA: Diagnosis not present

## 2014-12-12 DIAGNOSIS — E669 Obesity, unspecified: Secondary | ICD-10-CM | POA: Insufficient documentation

## 2014-12-12 DIAGNOSIS — I5022 Chronic systolic (congestive) heart failure: Secondary | ICD-10-CM | POA: Insufficient documentation

## 2014-12-12 DIAGNOSIS — E785 Hyperlipidemia, unspecified: Secondary | ICD-10-CM | POA: Insufficient documentation

## 2014-12-12 DIAGNOSIS — I255 Ischemic cardiomyopathy: Secondary | ICD-10-CM | POA: Insufficient documentation

## 2014-12-12 DIAGNOSIS — K219 Gastro-esophageal reflux disease without esophagitis: Secondary | ICD-10-CM | POA: Diagnosis not present

## 2014-12-12 DIAGNOSIS — E1122 Type 2 diabetes mellitus with diabetic chronic kidney disease: Secondary | ICD-10-CM | POA: Insufficient documentation

## 2014-12-12 LAB — BRAIN NATRIURETIC PEPTIDE: B Natriuretic Peptide: >4500 pg/mL — ABNORMAL HIGH (ref 0.0–100.0)

## 2014-12-12 LAB — BASIC METABOLIC PANEL
Anion gap: 12 (ref 5–15)
BUN: 23 mg/dL — ABNORMAL HIGH (ref 6–20)
CO2: 26 mmol/L (ref 22–32)
CREATININE: 1.65 mg/dL — AB (ref 0.44–1.00)
Calcium: 8.5 mg/dL — ABNORMAL LOW (ref 8.9–10.3)
Chloride: 100 mmol/L — ABNORMAL LOW (ref 101–111)
GFR, EST AFRICAN AMERICAN: 36 mL/min — AB (ref >60)
GFR, EST NON AFRICAN AMERICAN: 31 mL/min — AB (ref >60)
Glucose, Bld: 266 mg/dL — ABNORMAL HIGH (ref 65–99)
POTASSIUM: 4.1 mmol/L (ref 3.5–5.1)
Sodium: 138 mmol/L (ref 135–145)

## 2014-12-12 MED ORDER — CARVEDILOL 6.25 MG PO TABS: 6.2500 mg | ORAL_TABLET | Freq: Two times a day (BID) | ORAL | Status: AC

## 2014-12-12 MED ORDER — TORSEMIDE 20 MG PO TABS: 80.0000 mg | ORAL_TABLET | Freq: Every day | ORAL | Status: DC

## 2014-12-12 NOTE — Patient Instructions (Signed)
Routine lab work today. We will notify you of any abnormal results, otherwise, no news is good news!  STOP Metoprolol.  START Carvedilol (Coreg) 6.25 mg (1 tablet) twice daily.  INCREASE Torsemide... Saturday and Sunday - Take 80mg  (4 tablets) in am and 40mg  (2 tablets) in pm. Monday - Take 80mg  (4 tablets) once daily.  Follow up 10 days.  Do the following things EVERYDAY: 1) Weigh yourself in the morning before breakfast. Write it down and keep it in a log. 2) Take your medicines as prescribed 3) Eat low salt foods-Limit salt (sodium) to 2000 mg per day.  4) Stay as active as you can everyday 5) Limit all fluids for the day to less than 2 liters

## 2014-12-14 NOTE — Addendum Note (Signed)
Encounter addended by: Laurey Moralealton S Hilliary Jock, MD on: 12/14/2014 10:52 PM<BR>     Documentation filed: Clinical Notes

## 2014-12-14 NOTE — Progress Notes (Signed)
Patient ID: Krista Wise, female   DOB: 1946/03/13, 69 y.o.   MRN: [id number] PCP: [name]  69 yo with history of CAD, DM, HTN, OHS/OSA, CKD, and systolic CHF presents for followup. She had unstable angina in 2/08 with a stent placed in her distal CFX. She had a myoview in 2012 that was ischemic by both ECG and perfusion images. Echo showed preserved EF (55-60%), but there was moderate pulmonary hypertension suggesting likely diastolic LV dysfunction.  Had cath in 4/12 where left heart cath showed 80-90% mid and distal circumflex stenoses before and after the prior-placed stent and a totally occluded moderate-sized OM1.  She received 2 drug eluting stents in the CFX.   In 2/13, she was admitted with hypercarbic respiratory failure requiring bipap.  She was thought to have acute bronchitis with reactive airways disease and decompensation of her baseline poor respiratory status.  She also had acute on chronic diastolic CHF.  Her beta blocker was changed to bisoprolol because of wheezing.  Mildly elevated troponin was thought to be due to demand ischemia.  She lost 11 lbs in the hospital with diuresis.  She wears oxygen with exertion but is not using CPAP (it is not comfortable for her).  Lexiscan myoview done after this hospitalization showed EF 68%, moderate partially reversible lateral defect that actually looked better than the prior myoview.    She had RHC on 11/15, showing mean RA 8, PA 53/24, and mean PCWP 19.  She was started on torsemide 60 mg daily.   Admitted to [facility] 12/19 through 07/19/14 with acute dyspnea. Diuresed with IV lasix and transitioned back to torsemide 60 mg daily. Overall diuresed 10 pounds. Also discharged on nocturnal oxygen. She was discharged off ACEI and ibuprofen due to worsening renal function.   Lexiscan Cardiolite in 4/16 showed fixed basal to mid inferolateral defect with reversible apical lateral defect, suggestive of infarct with peri-infarct ischemia.  She was  managed medically given no chest pain and significant CKD.   She was admitted in 5/16 with septic shock in the setting of C difficile colitis, HCAP, and UTI.  She had AKI and was on norepinephrine for a time.  Troponin increased to > 4.  She developed CHF and had to be diuresed.  Echo in 5/16 showed EF 30% with moderately decreased RV systolic function.  She was noted to have intermittent LBBB.  She was discharged to rehab at [facility].   She returns for followup today.  [name] not doing much walking.  [name] mostly in a wheelchair but does get up to use the walker.  She denies dyspnea walking short distances.  [name] very fatigued.  She sleeps on her side (short of breath on her back).  No PND.  No palpitations.  She has been unable to tolerate CPAP.  Weight is down 25 lbs compared to prior appt in this office.      Labs (3/12): BNP 417 => 309, K 4.4, creatinine 0.7, HCT 44, free T3/T4 normal, TSH normal, HDL 38, LDL 96.5  Labs (5/12): BNP 422 => 160, K 3.9, creatinine 0.9, HDL 33, LDL 84 Labs (6/12): LDL 50, HDL 34, TGs 197 Labs (7/12): K 4.1, creatinine 1.1 Labs (11/12): K 3.9, creatinine 1.4, BNP 91 Labs (2/13): K 3.6, creatinine 0.92 Labs (3/13): K 5.5 => 4, creatinine 2.2 => 0.9, HDL 33, TGs 335, LDL 54, BNP 76 Labs (7/13): K 4, creatinine 1.2 Labs (11/15): K 3.8 => 4, creatinine 1.6 =>  2.4 => 3.0 => 1.6, pBNP 250, LDL 51, HDL 26 Labs (07/20/15): K 4.5 Creatinine 1.94  Labs 08/01/2014: K 4.3 Creatinine 2.06 BNP 421  Labs (5/16): K 4.5, creatinine 2.23, HCT 30.3  ECG: NSR, left axis deviation, diffuse T wave inversions  Allergies (verified):  No Known Drug Allergies   Family History:  Father: Deceased; MI in his 64s, CABG, CHF  Mother: Alive; HTN, rhematoid arthritis, anemic   Social History:  Married, lives with husband in [place]  disabled: diabetes & fibromyagia  Tobacco Use - No.  Alcohol Use - no  Regular Exercise - no   ROS: All systems reviewed and negative except  as per HPI.   Past Medical History:  1. Hypertension  2. Depression  3. DM  4. Obesity  5. GERD  6. CAD: Unstable angina 2/08. PCI to distal codominant CFX with 2.5 x 20 Taxus DES. Lexiscan myoview (3/12): chest pain and dyspnea with stress, EF 62%, small fixed lateral perfusion defect with significant surrounding reversible perfusion defect suggestive of significant ischemia, ischemic ECG response.  LHC (4/12): 80-90% mid and distal CFX stenosis before and after prior-placed stent, total occlusion of a moderate 1st OM.  Patient had DES x 2 to CFX.   Lexiscan myoview (3/13) with EF 68%, moderate partially reversible lateral defect c/w prior MI/peri-infarct ischemia.  Defect less marked than on the prior study. Lexiscan Cardiolite (4/16) with EF 47%, fixed basal to mid inferolateral defect, reversible apical lateral defect (infarct with peri-infarct ischemia).   7. Fibromyalgia  8. Chronic systolic CHF: Ischemic cardiomyopathy.  Echo (3/12) with EF 55-60%, mild LV hypertrophy, mild aortic stenosis (mean gradient 13 mmHg), mild MR, normal RV size and systolic function, PA systolic pressure 55 mmHg.  Echo (2/13): EF 60%, mild LVH, moderate diastolic dysfunction, mild AI, aortic sclerosis, mild MR, mild RV dilation and dysfunction, PASP 61 mmHg.  Echo (11/15) with EF 55-60%, mild LVH, mild AI, mild MR.  RHC (11/15) with mean RA 8, PA 53/24 mean 36, mean PCWP 19, CI 4.23, PVR 1.9 WU.  Echo (5/16) with EF 30%, mild LVH, severe inferoseptal and inferior hypokinesis, inferolateral akinesis, grade II diastolic dysfunction, moderate MR, moderate to severe RV dilation with moderately decreased RV systolic function, PA systolic pressure 60 mmHg.  9. Aortic stenosis: Minimal by most recent echo.  10.  Diabetic retinopathy 11.  H/o vertigo 12.  Peripheral neuropathy likely from diabetes.  13. OHS/OSA: Severe OSA on sleep study.  Patient is off CPAP currently b/c she could not tolerate the mask.  14. Back surgery  in 7/14.  15. CKD 16. RHC 06/18/14 RA mean 8 RV 49/10 PA 53/24, mean 36 PCWP mean 19 Cardiac Output (Fick) 9.02  Cardiac Index (Fick) 4.23 PVR 1.9 WU    Current Outpatient Prescriptions  Medication Sig Dispense Refill  . acetaminophen (TYLENOL) 325 MG tablet Take 2 tablets (650 mg total) by mouth every 6 (six) hours as needed for headache.    Marland Kitchen aspirin EC 81 MG tablet Take 1 tablet (81 mg total) by mouth daily.    Marland Kitchen atorvastatin (LIPITOR) 40 MG tablet Take 1 tablet (40 mg total) by mouth daily at 6 PM. 30 tablet 1  . Cholecalciferol (VITAMIN D3) 1000 UNITS CAPS Take 1 capsule (1,000 Units total) by mouth daily. 30 capsule 1  . clopidogrel (PLAVIX) 75 MG tablet Take 1 tablet (75 mg total) by mouth daily.    . DULoxetine (CYMBALTA) 60 MG capsule Take 1 capsule (60 mg total)  by mouth 2 (two) times daily. 60 capsule 3  . ferrous sulfate 325 (65 FE) MG tablet Take 1 tablet (325 mg total) by mouth 2 (two) times daily. 60 tablet 3  . glimepiride (AMARYL) 1 MG tablet Take 1 tablet (1 mg total) by mouth daily with breakfast.    . lidocaine (LIDODERM) 5 % Place 1 patch onto the skin daily. Remove & Discard patch within 12 hours or as directed by MD    . magnesium chloride (SLOW-MAG) 64 MG TBEC SR tablet Take 1 tablet (64 mg total) by mouth daily. 60 tablet   . Multiple Vitamin (MULTIVITAMIN WITH MINERALS) TABS Take 1 tablet by mouth daily.    . potassium chloride SA (K-DUR,KLOR-CON) 20 MEQ tablet Take 1 tablet (20 mEq total) by mouth daily. 30 tablet 1  . rOPINIRole (REQUIP) 2 MG tablet Take 1 tablet (2 mg total) by mouth at bedtime. 30 tablet 1  . saccharomyces boulardii (FLORASTOR) 250 MG capsule Take 250 mg by mouth 2 (two) times daily.    Marland Kitchen. torsemide (DEMADEX) 20 MG tablet Take 4 tablets (80 mg total) by mouth daily. 120 tablet 6  . carvedilol (COREG) 6.25 MG tablet Take 1 tablet (6.25 mg total) by mouth 2 (two) times daily. 60 tablet 6  . vancomycin (VANCOCIN) 50 mg/mL oral solution Take 2.5  mLs (125 mg total) by mouth 4 (four) times daily. Through 12/12/14, then stop    . [DISCONTINUED] simvastatin (ZOCOR) 40 MG tablet Take 40 mg by mouth every evening.     No current facility-administered medications for this encounter.    BP 118/70 mmHg  Pulse 74  Wt 222 lb 12 oz (101.039 kg)  SpO2 95% General: Well developed, well nourished, in no acute distress. Obese.  Neck: Neck supple, JVP 12-14 cm. No masses, thyromegaly or abnormal cervical nodes.  Lungs: Clear bilaterally  Heart: Non-displaced PMI, chest non-tender; regular rate and rhythm, S1, S2 without rubs or gallops. 2/6 early systolic murmur RUSB. Carotid upstroke normal, no bruit. Pedals normal pulses. 2+ edema 3/4 to knees bilaterally.  Abdomen: Obese, Bowel sounds positive; abdomen soft and non-tender without masses, organomegaly, or hernias noted. No hepatosplenomegaly.  Extremities: No clubbing or cyanosis.  Neurologic: Alert and oriented x 3.   Assessment/Plan: 1. CAD: No chest pain.  EF down to 30% on last echo. Suspect this is an ischemia cardiomyopathy.  Last Cardiolite in 4/16 showed inferolateral infarction with peri-infarct ischemia.  - Continue ASA, Plavix, statin.  - Would avoid cath for now with CKD and lack of chest pain.    2. Chronic systolic CHF: NYHA III. Ischemic cardiomyopathy.  [name] volume overloaded on exam today.   - Increase torsemide to 80 qam/40 qpm x 2 days, then use torsemide 80 mg daily after that.   - Stop metoprolol and use Coreg 6.25 mg bid instead.   - Hold off on ACEI for now with CKD.  - BMET/BNP today.   - Followup 10 days with repeat BMET.  3. Hyperlipidemia: Check lipids next appointment.   4. HTN: BP stable.   5. OHS/OSA: Unable to tolerate CPAP. [name] not using oxygen at night (but is supposed to use it).  I encouraged her to use nocturnal oxygen.  6. CKD:  BMET today, needs followup with nephrology.   Followup in 10 days.   Marca AnconaDalton Clio Gerhart 12/14/2014

## 2014-12-15 LAB — CBC AND DIFFERENTIAL
HCT: 29 % — AB (ref 36–46)
Hemoglobin: 9 g/dL — AB (ref 12.0–16.0)
Platelets: 216 K/µL (ref 150–399)
WBC: 5.8 10*3/mL

## 2014-12-15 LAB — BASIC METABOLIC PANEL WITH GFR
BUN: 25 mg/dL — AB (ref 4–21)
Creatinine: 1.5 mg/dL — AB (ref 0.5–1.1)
Glucose: 135 mg/dL
Potassium: 3.5 mmol/L (ref 3.4–5.3)
Sodium: 140 mmol/L (ref 137–147)

## 2014-12-16 ENCOUNTER — Non-Acute Institutional Stay (SKILLED_NURSING_FACILITY): Payer: Medicare Other | Admitting: Registered Nurse

## 2014-12-16 ENCOUNTER — Encounter: Payer: Self-pay | Admitting: Registered Nurse

## 2014-12-16 DIAGNOSIS — A047 Enterocolitis due to Clostridium difficile: Secondary | ICD-10-CM

## 2014-12-16 DIAGNOSIS — I10 Essential (primary) hypertension: Secondary | ICD-10-CM

## 2014-12-16 DIAGNOSIS — I5043 Acute on chronic combined systolic (congestive) and diastolic (congestive) heart failure: Secondary | ICD-10-CM

## 2014-12-16 DIAGNOSIS — N179 Acute kidney failure, unspecified: Secondary | ICD-10-CM | POA: Diagnosis not present

## 2014-12-16 DIAGNOSIS — E118 Type 2 diabetes mellitus with unspecified complications: Secondary | ICD-10-CM | POA: Diagnosis not present

## 2014-12-16 DIAGNOSIS — T25022S Burn of unspecified degree of left foot, sequela: Secondary | ICD-10-CM

## 2014-12-16 DIAGNOSIS — E785 Hyperlipidemia, unspecified: Secondary | ICD-10-CM

## 2014-12-16 DIAGNOSIS — D638 Anemia in other chronic diseases classified elsewhere: Secondary | ICD-10-CM | POA: Diagnosis not present

## 2014-12-16 DIAGNOSIS — R5381 Other malaise: Secondary | ICD-10-CM | POA: Diagnosis not present

## 2014-12-16 DIAGNOSIS — M545 Low back pain, unspecified: Secondary | ICD-10-CM

## 2014-12-16 DIAGNOSIS — A0472 Enterocolitis due to Clostridium difficile, not specified as recurrent: Secondary | ICD-10-CM

## 2014-12-16 DIAGNOSIS — G2581 Restless legs syndrome: Secondary | ICD-10-CM | POA: Diagnosis not present

## 2014-12-16 DIAGNOSIS — N189 Chronic kidney disease, unspecified: Secondary | ICD-10-CM

## 2014-12-16 DIAGNOSIS — I214 Non-ST elevation (NSTEMI) myocardial infarction: Secondary | ICD-10-CM | POA: Diagnosis not present
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
## 2014-12-24 ENCOUNTER — Ambulatory Visit (HOSPITAL_COMMUNITY)
Admission: RE | Admit: 2014-12-24 | Discharge: 2014-12-24 | Disposition: A | Discharge status: Home / Self Care | Payer: Medicare Other | Source: Ambulatory Visit | Attending: Cardiology | Admitting: Cardiology

## 2014-12-24 VITALS — BP 114/62 | HR 65 | Wt 234.0 lb

## 2014-12-24 DIAGNOSIS — N183 Chronic kidney disease, stage 3 unspecified: Secondary | ICD-10-CM

## 2014-12-24 DIAGNOSIS — I2510 Atherosclerotic heart disease of native coronary artery without angina pectoris: Secondary | ICD-10-CM

## 2014-12-24 DIAGNOSIS — G4733 Obstructive sleep apnea (adult) (pediatric): Secondary | ICD-10-CM | POA: Diagnosis not present

## 2014-12-24 DIAGNOSIS — E785 Hyperlipidemia, unspecified: Secondary | ICD-10-CM | POA: Insufficient documentation

## 2014-12-24 DIAGNOSIS — I5022 Chronic systolic (congestive) heart failure: Secondary | ICD-10-CM | POA: Diagnosis not present

## 2014-12-24 DIAGNOSIS — I255 Ischemic cardiomyopathy: Secondary | ICD-10-CM | POA: Insufficient documentation

## 2014-12-24 DIAGNOSIS — I5042 Chronic combined systolic (congestive) and diastolic (congestive) heart failure: Secondary | ICD-10-CM | POA: Diagnosis not present

## 2014-12-24 DIAGNOSIS — N189 Chronic kidney disease, unspecified: Secondary | ICD-10-CM | POA: Diagnosis not present

## 2014-12-24 DIAGNOSIS — I129 Hypertensive chronic kidney disease with stage 1 through stage 4 chronic kidney disease, or unspecified chronic kidney disease: Secondary | ICD-10-CM | POA: Diagnosis not present

## 2014-12-24 LAB — BASIC METABOLIC PANEL
ANION GAP: 11 (ref 5–15)
BUN: 29 mg/dL — ABNORMAL HIGH (ref 6–20)
CO2: 25 mmol/L (ref 22–32)
CREATININE: 1.64 mg/dL — AB (ref 0.44–1.00)
Calcium: 8.7 mg/dL — ABNORMAL LOW (ref 8.9–10.3)
Chloride: 102 mmol/L (ref 101–111)
GFR calc non Af Amer: 32 mL/min — ABNORMAL LOW (ref >60)
GFR, EST AFRICAN AMERICAN: 37 mL/min — AB (ref >60)
GLUCOSE: 119 mg/dL — AB (ref 65–99)
Potassium: 5.1 mmol/L (ref 3.5–5.1)
SODIUM: 138 mmol/L (ref 135–145)

## 2014-12-24 LAB — BRAIN NATRIURETIC PEPTIDE: B Natriuretic Peptide: 3899.2 pg/mL — ABNORMAL HIGH (ref 0.0–100.0)

## 2014-12-24 MED ORDER — POTASSIUM CHLORIDE CRYS ER 20 MEQ PO TBCR: 20.0000 meq | EXTENDED_RELEASE_TABLET | Freq: Two times a day (BID) | ORAL | Status: DC

## 2014-12-24 MED ORDER — TORSEMIDE 20 MG PO TABS: ORAL_TABLET | ORAL | Status: AC

## 2014-12-24 MED ORDER — ISOSORBIDE MONONITRATE ER 30 MG PO TB24: 30.0000 mg | ORAL_TABLET | Freq: Every day | ORAL | Status: AC

## 2014-12-24 MED ORDER — HYDRALAZINE HCL 10 MG PO TABS: 10.0000 mg | ORAL_TABLET | Freq: Three times a day (TID) | ORAL | Status: AC

## 2014-12-24 NOTE — Patient Instructions (Signed)
Start Hydralazine 10 mg Three times a day   Start Imdur 30 mg daily  Change Torsemide to 80 mg (4 tabs) in AM and 40 mg (2 tabs) in PM  Change Potassium to 20 meq Twice daily   Labs today  Your physician recommends that you schedule a follow-up appointment in: 2 weeks

## 2014-12-25 ENCOUNTER — Telehealth (HOSPITAL_COMMUNITY): Payer: Self-pay | Admitting: Cardiology

## 2014-12-25 DIAGNOSIS — I5022 Chronic systolic (congestive) heart failure: Secondary | ICD-10-CM

## 2014-12-25 MED ORDER — POTASSIUM CHLORIDE CRYS ER 20 MEQ PO TBCR: 20.0000 meq | EXTENDED_RELEASE_TABLET | Freq: Every day | ORAL | Status: AC

## 2014-12-25 NOTE — Telephone Encounter (Signed)
Pt aware and voiced understanding 

## 2014-12-25 NOTE — Telephone Encounter (Signed)
-----   Message from Laurey Moralealton S McLean, MD sent at 12/24/2014  5:53 PM EDT ----- Please contact patient: Keep KCl at 20 mEq daily rather than increasing KCl to bid as we talked about in office today.

## 2014-12-25 NOTE — Progress Notes (Signed)
Patient ID: Krista Wise, female   DOB: [birth year], 69 y.o.   MRN: [id number] PCP: Tomi BambergerSusan [name]  69 yo with history of CAD, DM, HTN, OHS/OSA, CKD, and systolic CHF presents for followup. She had unstable angina in 2/08 with a stent placed in her distal CFX. She had a myoview in 2012 that was ischemic by both ECG and perfusion images. Echo showed preserved EF (55-60%), but there was moderate pulmonary hypertension suggesting likely diastolic LV dysfunction.  Had cath in 4/12 where left heart cath showed 80-90% mid and distal circumflex stenoses before and after the prior-placed stent and a totally occluded moderate-sized OM1.  She received 2 drug eluting stents in the CFX.   In 2/13, she was admitted with hypercarbic respiratory failure requiring bipap.  She was thought to have acute bronchitis with reactive airways disease and decompensation of her baseline poor respiratory status.  She also had acute on chronic diastolic CHF.  Her beta blocker was changed to bisoprolol because of wheezing.  Mildly elevated troponin was thought to be due to demand ischemia.  She lost 11 lbs in the hospital with diuresis.  She wears oxygen with exertion but is not using CPAP (it is not comfortable for her).  Lexiscan myoview done after this hospitalization showed EF 68%, moderate partially reversible lateral defect that actually looked better than the prior myoview.    She had RHC on 11/15, showing mean RA 8, PA 53/24, and mean PCWP 19.  She was started on torsemide 60 mg daily.   [facility] through 07/19/14 with acute dyspnea. Diuresed with IV lasix and transitioned back to torsemide 60 mg daily. Overall diuresed 10 pounds. Also discharged on nocturnal oxygen. She was discharged off ACEI and ibuprofen due to worsening renal function.   Lexiscan Cardiolite in 4/16 showed fixed basal to mid inferolateral defect with reversible apical lateral defect, suggestive of infarct with peri-infarct ischemia.  She was  managed medically given no chest pain and significant CKD.   She was admitted in 5/16 with septic shock in the setting of C difficile colitis, HCAP, and UTI.  She had AKI and was on norepinephrine for a time.  Troponin increased to > 4.  She developed CHF and had to be diuresed.  Echo in 5/16 showed EF 30% with moderately decreased RV systolic function.  She was noted to have intermittent LBBB.  She was [facility], now home since Thursday.   She returns for followup today.  Legs are weak and she is not walking much.  [name] getting home PT.  She says that she is not short of breath walking around her house.  She has mild orthopnea.  No chest pain.   She has been unable to tolerate CPAP.  She actually says that she is feeling the best that she has felt in several months.  Weight is up 12 lbs.       Labs (3/12): BNP 417 => 309, K 4.4, creatinine 0.7, HCT 44, free T3/T4 normal, TSH normal, HDL 38, LDL 96.5  Labs (5/12): BNP 422 => 160, K 3.9, creatinine 0.9, HDL 33, LDL 84 Labs (6/12): LDL 50, HDL 34, TGs 197 Labs (7/12): K 4.1, creatinine 1.1 Labs (11/12): K 3.9, creatinine 1.4, BNP 91 Labs (2/13): K 3.6, creatinine 0.92 Labs (3/13): K 5.5 => 4, creatinine 2.2 => 0.9, HDL 33, TGs 335, LDL 54, BNP 76 Labs (7/13): K 4, creatinine 1.2 Labs (11/15): K 3.8 => 4,  creatinine 1.6 => 2.4 => 3.0 => 1.6, pBNP 250, LDL 51, HDL 26 Labs (07/20/15): K 4.5 Creatinine 1.94  Labs 08/01/2014: K 4.3 Creatinine 2.06 BNP 421  Labs (5/16): K 4.5 => 3.5, creatinine 2.23 => 1.5,  HCT 30.3  Allergies (verified):  No Known Drug Allergies   Family History:  Father: Deceased; MI in his 71s, CABG, CHF  Mother: Alive; HTN, rhematoid arthritis, anemic   Social History:  Married, lives with husband in [place]  disabled: diabetes & fibromyagia  Tobacco Use - No.  Alcohol Use - no  Regular Exercise - no   ROS: All systems reviewed and negative except as per HPI.   Past Medical History:  1.  Hypertension  2. Depression  3. DM  4. Obesity  5. GERD  6. CAD: Unstable angina 2/08. PCI to distal codominant CFX with 2.5 x 20 Taxus DES. Lexiscan myoview (3/12): chest pain and dyspnea with stress, EF 62%, small fixed lateral perfusion defect with significant surrounding reversible perfusion defect suggestive of significant ischemia, ischemic ECG response.  LHC (4/12): 80-90% mid and distal CFX stenosis before and after prior-placed stent, total occlusion of a moderate 1st OM.  Patient had DES x 2 to CFX.   Lexiscan myoview (3/13) with EF 68%, moderate partially reversible lateral defect c/w prior MI/peri-infarct ischemia.  Defect less marked than on the prior study. Lexiscan Cardiolite (4/16) with EF 47%, fixed basal to mid inferolateral defect, reversible apical lateral defect (infarct with peri-infarct ischemia).   7. Fibromyalgia  8. Chronic systolic CHF: Ischemic cardiomyopathy.  Echo (3/12) with EF 55-60%, mild LV hypertrophy, mild aortic stenosis (mean gradient 13 mmHg), mild MR, normal RV size and systolic function, PA systolic pressure 55 mmHg.  Echo (2/13): EF 60%, mild LVH, moderate diastolic dysfunction, mild AI, aortic sclerosis, mild MR, mild RV dilation and dysfunction, PASP 61 mmHg.  Echo (11/15) with EF 55-60%, mild LVH, mild AI, mild MR.  RHC (11/15) with mean RA 8, PA 53/24 mean 36, mean PCWP 19, CI 4.23, PVR 1.9 WU.  Echo (5/16) with EF 30%, mild LVH, severe inferoseptal and inferior hypokinesis, inferolateral akinesis, grade II diastolic dysfunction, moderate MR, moderate to severe RV dilation with moderately decreased RV systolic function, PA systolic pressure 60 mmHg.  9. Aortic stenosis: Minimal by most recent echo.  10.  Diabetic retinopathy 11.  H/o vertigo 12.  Peripheral neuropathy likely from diabetes.  13. OHS/OSA: Severe OSA on sleep study.  Patient is off CPAP currently b/c she could not tolerate the mask.  14. Back surgery in 7/14.  15. CKD 16. RHC 06/18/14 RA mean  8 RV 49/10 PA 53/24, mean 36 PCWP mean 19 Cardiac Output (Fick) 9.02  Cardiac Index (Fick) 4.23 PVR 1.9 WU    Current Outpatient Prescriptions  Medication Sig Dispense Refill  . acetaminophen (TYLENOL) 325 MG tablet Take 2 tablets (650 mg total) by mouth every 6 (six) hours as needed for headache.    Marland Kitchen aspirin EC 81 MG tablet Take 1 tablet (81 mg total) by mouth daily.    Marland Kitchen atorvastatin (LIPITOR) 40 MG tablet Take 1 tablet (40 mg total) by mouth daily at 6 PM. 30 tablet 1  . carvedilol (COREG) 6.25 MG tablet Take 1 tablet (6.25 mg total) by mouth 2 (two) times daily. 60 tablet 6  . Cholecalciferol (VITAMIN D3) 1000 UNITS CAPS Take 1 capsule (1,000 Units total) by mouth daily. 30 capsule 1  . clopidogrel (PLAVIX) 75 MG tablet Take 1 tablet (75  mg total) by mouth daily.    . DULoxetine (CYMBALTA) 60 MG capsule Take 1 capsule (60 mg total) by mouth 2 (two) times daily. 60 capsule 3  . ferrous sulfate 325 (65 FE) MG tablet Take 1 tablet (325 mg total) by mouth 2 (two) times daily. 60 tablet 3  . glimepiride (AMARYL) 1 MG tablet Take 1 tablet (1 mg total) by mouth daily with breakfast.    . lidocaine (LIDODERM) 5 % Place 1 patch onto the skin daily. Remove & Discard patch within 12 hours or as directed by MD    . magnesium chloride (SLOW-MAG) 64 MG TBEC SR tablet Take 1 tablet (64 mg total) by mouth daily. 60 tablet   . Multiple Vitamin (MULTIVITAMIN WITH MINERALS) TABS Take 1 tablet by mouth daily.    Marland Kitchen rOPINIRole (REQUIP) 2 MG tablet Take 1 tablet (2 mg total) by mouth at bedtime. 30 tablet 1  . saccharomyces boulardii (FLORASTOR) 250 MG capsule Take 250 mg by mouth 2 (two) times daily.    Marland Kitchen torsemide (DEMADEX) 20 MG tablet Take 4 tabs in AM and 2 tabs in PM 180 tablet 3  . hydrALAZINE (APRESOLINE) 10 MG tablet Take 1 tablet (10 mg total) by mouth 3 (three) times daily. 90 tablet 3  . isosorbide mononitrate (IMDUR) 30 MG 24 hr tablet Take 1 tablet (30 mg total) by mouth daily. 30 tablet 3  .  potassium chloride SA (K-DUR,KLOR-CON) 20 MEQ tablet Take 1 tablet (20 mEq total) by mouth daily. 30 tablet 3  . [DISCONTINUED] simvastatin (ZOCOR) 40 MG tablet Take 40 mg by mouth every evening.     No current facility-administered medications for this encounter.    BP 114/62 mmHg  Pulse 65  Wt 234 lb (106.142 kg)  SpO2 97% General: Well developed, well nourished, in no acute distress. Obese.  Neck: Neck supple, JVP 14 cm. No masses, thyromegaly or abnormal cervical nodes.  Lungs: Clear bilaterally  Heart: Non-displaced PMI, chest non-tender; regular rate and rhythm, S1, S2 without rubs or gallops. 1/6 early systolic murmur RUSB. Carotid upstroke normal, no bruit. Pedals normal pulses. 2+ edema to knees bilaterally.  Abdomen: Obese, Bowel sounds positive; abdomen soft and non-tender without masses, organomegaly, or hernias noted. No hepatosplenomegaly.  Extremities: No clubbing or cyanosis.  Neurologic: Alert and oriented x 3.   Assessment/Plan: 1. CAD: No chest pain.  EF down to 30% on last echo. Suspect this is an ischemia cardiomyopathy.  Last Cardiolite in 4/16 showed inferolateral infarction with peri-infarct ischemia.  - Continue ASA, Plavix, statin.  - Would avoid cath for now with CKD and lack of chest pain.    2. Chronic systolic CHF: NYHA III. Ischemic cardiomyopathy.  Says she is feeling great but looks more volume overloaded.   - Increase torsemide to 80 qam/40 qpm and take KCl 20 bid.   - Continue Coreg.  - Hold off on ACEI for now with recent AKI on CKD => will start hydralazine 10 mg tid + Imdur 30 daily for afterload reduction.   - BMET/BNP today.   - Followup 2 wks with repeat BMET.  3. Hyperlipidemia: Check lipids next appointment.   4. HTN: BP stable.   5. OHS/OSA: Unable to tolerate CPAP. [name] not using oxygen at night (but is supposed to use it).  I encouraged her to use nocturnal oxygen.  6. CKD:  BMET today, needs followup with nephrology. Creatinine was  coming down at last check.   Followup in 2  wks.    [name] 12/25/2014

## 2015-01-11 ENCOUNTER — Emergency Department (HOSPITAL_COMMUNITY): Payer: Medicare Other

## 2015-01-11 ENCOUNTER — Encounter (HOSPITAL_COMMUNITY): Payer: Self-pay | Admitting: Emergency Medicine

## 2015-01-11 ENCOUNTER — Inpatient Hospital Stay (HOSPITAL_COMMUNITY)
Admission: EM | Admit: 2015-01-11 | Discharge: 2015-01-23 | DRG: 637 | Disposition: E | Payer: Medicare Other | Attending: Internal Medicine | Admitting: Internal Medicine

## 2015-01-11 DIAGNOSIS — E11641 Type 2 diabetes mellitus with hypoglycemia with coma: Secondary | ICD-10-CM | POA: Diagnosis present

## 2015-01-11 DIAGNOSIS — M2520 Flail joint, unspecified joint: Secondary | ICD-10-CM

## 2015-01-11 DIAGNOSIS — G4733 Obstructive sleep apnea (adult) (pediatric): Secondary | ICD-10-CM | POA: Diagnosis present

## 2015-01-11 DIAGNOSIS — N179 Acute kidney failure, unspecified: Secondary | ICD-10-CM | POA: Diagnosis not present

## 2015-01-11 DIAGNOSIS — R293 Abnormal posture: Secondary | ICD-10-CM

## 2015-01-11 DIAGNOSIS — Z66 Do not resuscitate: Secondary | ICD-10-CM | POA: Diagnosis not present

## 2015-01-11 DIAGNOSIS — F458 Other somatoform disorders: Secondary | ICD-10-CM | POA: Diagnosis present

## 2015-01-11 DIAGNOSIS — I129 Hypertensive chronic kidney disease with stage 1 through stage 4 chronic kidney disease, or unspecified chronic kidney disease: Secondary | ICD-10-CM | POA: Diagnosis present

## 2015-01-11 DIAGNOSIS — F329 Major depressive disorder, single episode, unspecified: Secondary | ICD-10-CM | POA: Diagnosis present

## 2015-01-11 DIAGNOSIS — B9561 Methicillin susceptible Staphylococcus aureus infection as the cause of diseases classified elsewhere: Secondary | ICD-10-CM | POA: Diagnosis present

## 2015-01-11 DIAGNOSIS — Z79899 Other long term (current) drug therapy: Secondary | ICD-10-CM | POA: Diagnosis not present

## 2015-01-11 DIAGNOSIS — Z7902 Long term (current) use of antithrombotics/antiplatelets: Secondary | ICD-10-CM

## 2015-01-11 DIAGNOSIS — I5022 Chronic systolic (congestive) heart failure: Secondary | ICD-10-CM | POA: Diagnosis present

## 2015-01-11 DIAGNOSIS — E162 Hypoglycemia, unspecified: Secondary | ICD-10-CM | POA: Diagnosis not present

## 2015-01-11 DIAGNOSIS — J0410 Acute tracheitis without obstruction: Secondary | ICD-10-CM | POA: Diagnosis present

## 2015-01-11 DIAGNOSIS — R4020 Unspecified coma: Secondary | ICD-10-CM | POA: Diagnosis not present

## 2015-01-11 DIAGNOSIS — Z955 Presence of coronary angioplasty implant and graft: Secondary | ICD-10-CM

## 2015-01-11 DIAGNOSIS — R40243 Glasgow coma scale score 3-8, unspecified time: Secondary | ICD-10-CM

## 2015-01-11 DIAGNOSIS — K219 Gastro-esophageal reflux disease without esophagitis: Secondary | ICD-10-CM | POA: Diagnosis present

## 2015-01-11 DIAGNOSIS — Z6841 Body Mass Index (BMI) 40.0 and over, adult: Secondary | ICD-10-CM | POA: Diagnosis not present

## 2015-01-11 DIAGNOSIS — R4182 Altered mental status, unspecified: Secondary | ICD-10-CM | POA: Diagnosis present

## 2015-01-11 DIAGNOSIS — I2510 Atherosclerotic heart disease of native coronary artery without angina pectoris: Secondary | ICD-10-CM | POA: Diagnosis present

## 2015-01-11 DIAGNOSIS — J9600 Acute respiratory failure, unspecified whether with hypoxia or hypercapnia: Secondary | ICD-10-CM | POA: Diagnosis present

## 2015-01-11 DIAGNOSIS — N183 Chronic kidney disease, stage 3 (moderate): Secondary | ICD-10-CM | POA: Diagnosis present

## 2015-01-11 DIAGNOSIS — J9690 Respiratory failure, unspecified, unspecified whether with hypoxia or hypercapnia: Secondary | ICD-10-CM

## 2015-01-11 DIAGNOSIS — I629 Nontraumatic intracranial hemorrhage, unspecified: Secondary | ICD-10-CM | POA: Diagnosis present

## 2015-01-11 DIAGNOSIS — I5031 Acute diastolic (congestive) heart failure: Secondary | ICD-10-CM | POA: Diagnosis present

## 2015-01-11 DIAGNOSIS — J9601 Acute respiratory failure with hypoxia: Secondary | ICD-10-CM | POA: Diagnosis not present

## 2015-01-11 DIAGNOSIS — Z7982 Long term (current) use of aspirin: Secondary | ICD-10-CM | POA: Diagnosis not present

## 2015-01-11 DIAGNOSIS — J810 Acute pulmonary edema: Secondary | ICD-10-CM | POA: Diagnosis not present

## 2015-01-11 DIAGNOSIS — Z9289 Personal history of other medical treatment: Secondary | ICD-10-CM

## 2015-01-11 LAB — TYPE AND SCREEN
ABO/RH(D): O POS
ANTIBODY SCREEN: POSITIVE
DAT, IgG: NEGATIVE

## 2015-01-11 LAB — COMPREHENSIVE METABOLIC PANEL
ALT: 9 U/L — ABNORMAL LOW (ref 14–54)
ANION GAP: 12 (ref 5–15)
AST: 14 U/L — ABNORMAL LOW (ref 15–41)
Albumin: 2.3 g/dL — ABNORMAL LOW (ref 3.5–5.0)
Alkaline Phosphatase: 61 U/L (ref 38–126)
BUN: 43 mg/dL — ABNORMAL HIGH (ref 6–20)
CALCIUM: 8.1 mg/dL — AB (ref 8.9–10.3)
CHLORIDE: 103 mmol/L (ref 101–111)
CO2: 25 mmol/L (ref 22–32)
CREATININE: 2.13 mg/dL — AB (ref 0.44–1.00)
GFR calc Af Amer: 26 mL/min — ABNORMAL LOW (ref >60)
GFR calc non Af Amer: 23 mL/min — ABNORMAL LOW (ref >60)
Glucose, Bld: 156 mg/dL — ABNORMAL HIGH (ref 65–99)
Potassium: 4.4 mmol/L (ref 3.5–5.1)
Sodium: 140 mmol/L (ref 135–145)
TOTAL PROTEIN: 5.5 g/dL — AB (ref 6.5–8.1)
Total Bilirubin: 0.5 mg/dL (ref 0.3–1.2)

## 2015-01-11 LAB — URINALYSIS, ROUTINE W REFLEX MICROSCOPIC
Bilirubin Urine: NEGATIVE
GLUCOSE, UA: NEGATIVE mg/dL
Hgb urine dipstick: NEGATIVE
Ketones, ur: NEGATIVE mg/dL
Nitrite: NEGATIVE
PH: 5 (ref 5.0–8.0)
Protein, ur: NEGATIVE mg/dL
SPECIFIC GRAVITY, URINE: 1.013 (ref 1.005–1.030)
Urobilinogen, UA: 0.2 mg/dL (ref 0.0–1.0)

## 2015-01-11 LAB — CBC WITH DIFFERENTIAL/PLATELET
BASOS ABS: 0 10*3/uL (ref 0.0–0.1)
Basophils Relative: 0 % (ref 0–1)
EOS ABS: 0.1 10*3/uL (ref 0.0–0.7)
Eosinophils Relative: 1 % (ref 0–5)
HCT: 32.6 % — ABNORMAL LOW (ref 36.0–46.0)
HEMOGLOBIN: 9.8 g/dL — AB (ref 12.0–15.0)
LYMPHS PCT: 9 % — AB (ref 12–46)
Lymphs Abs: 0.8 10*3/uL (ref 0.7–4.0)
MCH: 26.1 pg (ref 26.0–34.0)
MCHC: 30.1 g/dL (ref 30.0–36.0)
MCV: 86.7 fL (ref 78.0–100.0)
MONOS PCT: 6 % (ref 3–12)
Monocytes Absolute: 0.5 10*3/uL (ref 0.1–1.0)
NEUTROS PCT: 84 % — AB (ref 43–77)
Neutro Abs: 7.3 10*3/uL (ref 1.7–7.7)
Platelets: 267 10*3/uL (ref 150–400)
RBC: 3.76 MIL/uL — ABNORMAL LOW (ref 3.87–5.11)
RDW: 17.7 % — ABNORMAL HIGH (ref 11.5–15.5)
WBC: 8.7 10*3/uL (ref 4.0–10.5)

## 2015-01-11 LAB — CBG MONITORING, ED
GLUCOSE-CAPILLARY: 71 mg/dL (ref 65–99)
GLUCOSE-CAPILLARY: 153 mg/dL — AB (ref 65–99)
Glucose-Capillary: 114 mg/dL — ABNORMAL HIGH (ref 65–99)
Glucose-Capillary: 80 mg/dL (ref 65–99)

## 2015-01-11 LAB — ETHANOL: Alcohol, Ethyl (B): <5 mg/dL (ref <5)

## 2015-01-11 LAB — PROTIME-INR
INR: 1.17 (ref 0.00–1.49)
Prothrombin Time: 15.1 seconds (ref 11.6–15.2)

## 2015-01-11 LAB — APTT: aPTT: 31 seconds (ref 24–37)

## 2015-01-11 LAB — AMMONIA: AMMONIA: 55 umol/L — AB (ref 9–35)

## 2015-01-11 LAB — URINE MICROSCOPIC-ADD ON

## 2015-01-11 LAB — I-STAT CG4 LACTIC ACID, ED: Lactic Acid, Venous: 0.89 mmol/L (ref 0.5–2.0)

## 2015-01-11 MED ORDER — PIPERACILLIN-TAZOBACTAM 3.375 G IVPB 30 MIN
3.3750 g | INTRAVENOUS | Status: AC
  Administered 2015-01-12: 3.375 g via INTRAVENOUS
  Filled 2015-01-11: qty 50

## 2015-01-11 MED ORDER — SODIUM CHLORIDE 0.9 % IV SOLN
1000.0000 mL | Freq: Once | INTRAVENOUS | Status: AC
  Administered 2015-01-11: 1000 mL via INTRAVENOUS

## 2015-01-11 MED ORDER — PANTOPRAZOLE SODIUM 40 MG IV SOLR
40.0000 mg | Freq: Every day | INTRAVENOUS | Status: DC
  Administered 2015-01-11: 40 mg via INTRAVENOUS
  Filled 2015-01-11 (×2): qty 40

## 2015-01-11 MED ORDER — DEXTROSE-NACL 5-0.9 % IV SOLN: INTRAVENOUS | Status: DC

## 2015-01-11 MED ORDER — ETOMIDATE 2 MG/ML IV SOLN
INTRAVENOUS | Status: AC
  Filled 2015-01-11: qty 20

## 2015-01-11 MED ORDER — ROCURONIUM BROMIDE 50 MG/5ML IV SOLN
INTRAVENOUS | Status: AC
  Filled 2015-01-11: qty 2

## 2015-01-11 MED ORDER — SUCCINYLCHOLINE CHLORIDE 20 MG/ML IJ SOLN
INTRAMUSCULAR | Status: AC | PRN
  Administered 2015-01-11: 120 mg via INTRAVENOUS

## 2015-01-11 MED ORDER — SODIUM CHLORIDE 0.9 % IV SOLN: 250.0000 mL | INTRAVENOUS | Status: DC | PRN

## 2015-01-11 MED ORDER — DEXTROSE 50 % IV SOLN
INTRAVENOUS | Status: AC
  Filled 2015-01-11: qty 50

## 2015-01-11 MED ORDER — MIDAZOLAM HCL 2 MG/2ML IJ SOLN
INTRAMUSCULAR | Status: AC
  Administered 2015-01-11: 1 mg via INTRAVENOUS
  Filled 2015-01-11: qty 2

## 2015-01-11 MED ORDER — MIDAZOLAM HCL 2 MG/2ML IJ SOLN
1.0000 mg | Freq: Once | INTRAMUSCULAR | Status: AC
  Administered 2015-01-11: 1 mg via INTRAVENOUS

## 2015-01-11 MED ORDER — DEXTROSE 50 % IV SOLN: 25.0000 g | Freq: Once | INTRAVENOUS | Status: AC

## 2015-01-11 MED ORDER — SUCCINYLCHOLINE CHLORIDE 20 MG/ML IJ SOLN
INTRAMUSCULAR | Status: AC
  Filled 2015-01-11: qty 1

## 2015-01-11 MED ORDER — HYDROCORTISONE NA SUCCINATE PF 100 MG IJ SOLR
50.0000 mg | Freq: Four times a day (QID) | INTRAMUSCULAR | Status: DC
  Administered 2015-01-11 – 2015-01-12 (×2): 50 mg via INTRAVENOUS
  Filled 2015-01-11 (×5): qty 1
  Filled 2015-01-11: qty 2

## 2015-01-11 MED ORDER — PIPERACILLIN-TAZOBACTAM 3.375 G IVPB
3.3750 g | Freq: Three times a day (TID) | INTRAVENOUS | Status: DC
  Filled 2015-01-11 (×2): qty 50

## 2015-01-11 MED ORDER — MIDAZOLAM HCL 5 MG/ML IJ SOLN: 1.0000 mg | Freq: Once | INTRAMUSCULAR | Status: DC

## 2015-01-11 MED ORDER — DEXTROSE 50 % IV SOLN
1.0000 | Freq: Once | INTRAVENOUS | Status: AC
  Administered 2015-01-11: 50 mL via INTRAVENOUS

## 2015-01-11 MED ORDER — SODIUM CHLORIDE 0.9 % IV SOLN
1000.0000 mL | INTRAVENOUS | Status: DC
  Administered 2015-01-11: 1000 mL via INTRAVENOUS

## 2015-01-11 MED ORDER — LIDOCAINE HCL (CARDIAC) 20 MG/ML IV SOLN
INTRAVENOUS | Status: AC
  Filled 2015-01-11: qty 5

## 2015-01-11 MED ORDER — ETOMIDATE 2 MG/ML IV SOLN
INTRAVENOUS | Status: AC | PRN
  Administered 2015-01-11: 20 mg via INTRAVENOUS

## 2015-01-11 MED ORDER — DEXTROSE 10 % IV SOLN
INTRAVENOUS | Status: DC
  Administered 2015-01-11 – 2015-01-12 (×2): via INTRAVENOUS

## 2015-01-11 NOTE — Code Documentation (Signed)
Dr. Lynelle Doctor at bedside intubating.

## 2015-01-11 NOTE — H&P (Addendum)
PULMONARY / CRITICAL CARE MEDICINE   Name: Krista Wise MRN: [id number] DOB: May 25, 1946    ADMISSION DATE:  01/08/2015 CONSULTATION DATE:  12/27/2014  REFERRING MD :  Lynelle Doctor, MD  CHIEF COMPLAINT:  Change in MS  INITIAL PRESENTATION: 69 yr old change iN MS, hypoglycemic, posturing  STUDIES:  6/19 CT head>>> 6/19 EEG>>>  SIGNIFICANT EVENTS: 6/19 ETT placed, awaited Ct head,posturing   HISTORY OF PRESENT ILLNESS:  69 yo with history of CAD, DM, HTN, OHS/OSA, CKD, and systolic CHF a few recent admissions for resp failure, NIMV, then cdiff, septic shock. Initially EMS went to home, with chang ein MS, pt refused transport. Went back for same and arrived to cone glu 32 and altered MS. Clear poor airway control, snoring, poor neurostatus. Posturing, ETT placed, CT head awaited. Glu did improve after d50.  NO other history available  PAST MEDICAL HISTORY :   has a past medical history of Hypertension; Depression; Diabetes mellitus; GERD (gastroesophageal reflux disease); Obesity; Coronary artery disease (08/2006); CHF (congestive heart failure); Sleep apnea; Arthritis; Headache(784.0); and echocardiogram.  has past surgical history that includes Coronary stent placement (08/2006); Total abdominal hysterectomy; Cardiac catheterization; Lumbar fusion (02/02/2013); Back surgery; right heart catheterization (N/A, 06/18/2014); and Insertion of dialysis catheter (Right, 11/03/2014). Prior to Admission medications   Medication Sig Start Date End Date Taking? Authorizing Provider  aspirin EC 81 MG tablet Take 1 tablet (81 mg total) by mouth daily. 03/19/13  Yes Laurey Morale, MD  atorvastatin (LIPITOR) 40 MG tablet Take 1 tablet (40 mg total) by mouth daily at 6 PM. 11/25/14  Yes Mcarthur Rossetti Angiulli, PA-C  b complex vitamins tablet Take 1 tablet by mouth daily.   Yes Historical Provider, MD  carvedilol (COREG) 6.25 MG tablet Take 1 tablet (6.25 mg total) by mouth 2 (two) times daily. 12/12/14  Yes Laurey Morale, MD  Cholecalciferol (VITAMIN D PO) Take 1 tablet by mouth daily.   Yes Historical Provider, MD  clopidogrel (PLAVIX) 75 MG tablet Take 1 tablet (75 mg total) by mouth daily. 12/06/14  Yes Christiane Ha, MD  DULoxetine (CYMBALTA) 60 MG capsule Take 1 capsule (60 mg total) by mouth 2 (two) times daily. 11/25/14  Yes Daniel J Angiulli, PA-C  ferrous sulfate 325 (65 FE) MG tablet Take 1 tablet (325 mg total) by mouth 2 (two) times daily. 11/25/14  Yes Daniel J Angiulli, PA-C  glimepiride (AMARYL) 2 MG tablet Take 2 mg by mouth daily with breakfast.   Yes Historical Provider, MD  hydrALAZINE (APRESOLINE) 10 MG tablet Take 1 tablet (10 mg total) by mouth 3 (three) times daily. 12/24/14  Yes Laurey Morale, MD  isosorbide mononitrate (IMDUR) 30 MG 24 hr tablet Take 1 tablet (30 mg total) by mouth daily. 12/24/14  Yes Laurey Morale, MD  lisinopril (PRINIVIL,ZESTRIL) 20 MG tablet Take 20 mg by mouth daily.   Yes Historical Provider, MD  metFORMIN (GLUCOPHAGE) 1000 MG tablet Take 1,000 mg by mouth 2 (two) times daily with a meal.   Yes Historical Provider, MD  Multiple Vitamins-Minerals (MULTIVITAMIN GUMMIES ADULTS) CHEW Chew 1 each by mouth daily.   Yes Historical Provider, MD  oxybutynin (DITROPAN-XL) 10 MG 24 hr tablet Take 10 mg by mouth at bedtime.   Yes Historical Provider, MD  pantoprazole (PROTONIX) 20 MG tablet Take 20 mg by mouth daily.   Yes Historical Provider, MD  potassium chloride SA (K-DUR,KLOR-CON) 20 MEQ tablet Take 1 tablet (20 mEq total) by mouth daily.  12/25/14  Yes Dolores Patty, MD  pregabalin (LYRICA) 75 MG capsule Take 75 mg by mouth 2 (two) times daily.   Yes Historical Provider, MD  rOPINIRole (REQUIP) 2 MG tablet Take 1 tablet (2 mg total) by mouth at bedtime. 11/25/14  Yes Daniel J Angiulli, PA-C  sodium bicarbonate 650 MG tablet Take 650 mg by mouth 2 (two) times daily.   Yes Historical Provider, MD  torsemide (DEMADEX) 20 MG tablet Take 4 tabs in AM and 2 tabs in  PM Patient taking differently: Take 40-80 mg by mouth 2 (two) times daily. Take 4 tabs in AM and 2 tabs in PM 12/24/14  Yes Laurey Morale, MD  traZODone (DESYREL) 50 MG tablet Take 50 mg by mouth at bedtime.   Yes Historical Provider, MD  VITAMIN E PO Take 1 capsule by mouth daily.   Yes Historical Provider, MD  acetaminophen (TYLENOL) 325 MG tablet Take 2 tablets (650 mg total) by mouth every 6 (six) hours as needed for headache. 12/06/14   Christiane Ha, MD  Cholecalciferol (VITAMIN D3) 1000 UNITS CAPS Take 1 capsule (1,000 Units total) by mouth daily. 11/25/14   Mcarthur Rossetti Angiulli, PA-C  glimepiride (AMARYL) 1 MG tablet Take 1 tablet (1 mg total) by mouth daily with breakfast. 12/06/14   Christiane Ha, MD  magnesium chloride (SLOW-MAG) 64 MG TBEC SR tablet Take 1 tablet (64 mg total) by mouth daily. 12/06/14   Christiane Ha, MD   No Known Allergies  FAMILY HISTORY:  indicated that her mother is alive. She indicated that her father is deceased.  she is unable to provide further, vented altered SOCIAL HISTORY:  reports that she has never smoked. She has never used smokeless tobacco. She reports that she does not drink alcohol or use illicit drugs.  REVIEW OF SYSTEMS:  Unable, vented, posturing  SUBJECTIVE: posturing when i enter room  VITAL SIGNS: Temp:  [98.2 F (36.8 C)-98.8 F (37.1 C)] 98.8 F (37.1 C) (06/19 2230) Pulse Rate:  [73-89] 84 (06/19 2245) Resp:  [13-22] 22 (06/19 2245) BP: (74-118)/(56-90) 74/56 mmHg (06/19 2245) SpO2:  [100 %] 100 % (06/19 2245) FiO2 (%):  [60 %-100 %] 60 % (06/19 2200) HEMODYNAMICS:   VENTILATOR SETTINGS: Vent Mode:  [-] PRVC FiO2 (%):  [60 %-100 %] 60 % Set Rate:  [12 bmp-18 bmp] 18 bmp Vt Set:  [450 mL-500 mL] 450 mL PEEP:  [5 cmH20] 5 cmH20 Plateau Pressure:  [19 cmH20] 19 cmH20 INTAKE / OUTPUT: No intake or output data in the 24 hours ending February 02, 2015 2307  PHYSICAL EXAMINATION: General:  GCS 4, snoring resp ,  distress Neuro:  perr 3 mm, sluggish, posturing to pain decort, no gag, no cough, dolls wnl HEENT:  obese Cardiovascular:  s1 s2 rrr distant Lungs:  Slight ronchi bases Abdomen:  Obese, edema, no r/g, soft Musculoskeletal:  edema Skin:  No rash  LABS:  CBC  Recent Labs Lab 2015/02/02 2043  WBC 8.7  HGB 9.8*  HCT 32.6*  PLT 267   Coag's  Recent Labs Lab 02-02-15 2043  APTT 31  INR 1.17   BMET  Recent Labs Lab 02/02/15 2043  NA 140  K 4.4  CL 103  CO2 25  BUN 43*  CREATININE 2.13*  GLUCOSE 156*   Electrolytes  Recent Labs Lab 02/02/15 2043  CALCIUM 8.1*   Sepsis Markers  Recent Labs Lab 02/02/15 2053  LATICACIDVEN 0.89   ABG No results for input(s): PHART,  PCO2ART, PO2ART in the last 168 hours. Liver Enzymes  Recent Labs Lab 2015/02/01 2043  AST 14*  ALT 9*  ALKPHOS 61  BILITOT 0.5  ALBUMIN 2.3*   Cardiac Enzymes No results for input(s): TROPONINI, PROBNP in the last 168 hours. Glucose  Recent Labs Lab 02-01-15 2026 2015/02/01 2038 02/01/2015 2119  GLUCAP 71 153* 114*    Imaging Dg Chest Portable 1 View  2015/02/01   CLINICAL DATA:  Hypoglycemia.  Found unresponsive.  EXAM: PORTABLE CHEST - 1 VIEW  COMPARISON:  12/02/2014  FINDINGS: Endotracheal tube tip just below the clavicular heads. The orogastric tube enters the stomach at least.  Cardiomegaly and main pulmonary artery prominence, similar to previous when accounting for differences in technique. Coronary artery calcification.  Perihilar lung opacity. No evidence of pleural fluid or air leak. No acute osseous findings.  IMPRESSION: 1. The endotracheal and orogastric tubes are in good position. 2. Perihilar edema or aspiration.   Electronically Signed   By: Marnee Spring M.D.   On: 2015-02-01 21:15     ASSESSMENT / PLAN:  PULMONARY OETT 6/19>>> A: Acute resp failure, OHS, OSA P:   EDP to place ETT STAT ABg to assess, would avoid acidosis as will increase ICP if neuro  event Ensure rate 20 for now Repeat pcxr post intubation for ett Avoid hypoxia  CARDIOVASCULAR  A: r/o neuro event P:  Stat CT head, then determine needs higher MAP goals Tele ecg Allow pos balance  RENAL A:  ARF, r/o ATN, r/o hypovolemia ( diuretic dependent) P:   May need cvp Saline Repeat bmet Dc all diuretics  GASTROINTESTINAL A:  obese P:   NPO ppi LFT  HEMATOLOGIC A:  DVt prevention, r/o ICH P:  Get coags scd for now until head CT back  INFECTIOUS A:  R/o ASP, pcxr basilar retrocardiac infiltrates P:   BC 6/19>>> Sputum 6/19>>> Abx: Zosyn 6/19>>>  Follow pcxr in am   ENDOCRINE A:  Hypoglycemia, DM (oral agents) P:   Hold amaryl, metformin- home regimen No SSI unless glu greater 220 x 2 tsh Cortisol then stress roids for now Not on any IV insulin  NEUROLOGIC A: r/o CVA, r/o Seizures related to hypoglycemia, r/o hypoglycemic encephalopathy, r/o anoxia, r/o brainstem involvement, r/o ICH P:   RASS goal: 0 STAT CT head likely needs EEG Correct glu With posturing, if CT head neg, will consider MRI Neuro consult now, basilar?   FAMILY  - Updates: husband not her eyet  - Inter-disciplinary family meet or Palliative Care meeting due by: 6/26    TODAY'S SUMMARY: NEED CT head   ccm time 40 min   Mcarthur Rossetti. Tyson Alias, MD, FACP Pgr: [phone] [facility]  Pulmonary and Critical Care Medicine [facility] [place] Pager: [phone]  2015/02/01, 11:07 PM

## 2015-01-11 NOTE — ED Notes (Signed)
Dr. Sumner at bedside 

## 2015-01-11 NOTE — ED Notes (Addendum)
Pt from from home, arrives via EMS with hypoglycemia, called out EMS for same today and refused transport. Pt off insulin. Pt only on oral DM meds. Hx CHF. Upon EMS arrival, CBG 32 given amp of D50. CBG went up to 191, now 71. Dr. Lynelle Doctor at bedside. Snoring respirations, no purposeful movement.

## 2015-01-11 NOTE — ED Notes (Signed)
Pt not requiring any sedation, pt continues decerebrate posturing

## 2015-01-11 NOTE — ED Notes (Signed)
D10 started now at 75ML/HR

## 2015-01-11 NOTE — Progress Notes (Signed)
ANTIBIOTIC CONSULT NOTE - INITIAL  Pharmacy Consult for Zosyn Indication: asp pna  No Known Allergies  Patient Measurements: Height: 5\' 3"  (160 cm) IBW/kg (Calculated) : 52.4   Vital Signs: Temp: 100 F (37.8 C) (06/19 2317) Temp Source: Core (Comment) (06/19 2100) BP: 116/65 mmHg (06/19 2317) Pulse Rate: 85 (06/19 2317) Intake/Output from previous day:   Intake/Output from this shift: Total I/O In: 1000 [I.V.:1000] Out: -   Labs:  Recent Labs  02-09-15 2043  WBC 8.7  HGB 9.8*  PLT 267  CREATININE 2.13*   CrCl cannot be calculated (Unknown ideal weight.). No results for input(s): VANCOTROUGH, VANCOPEAK, VANCORANDOM, GENTTROUGH, GENTPEAK, GENTRANDOM, TOBRATROUGH, TOBRAPEAK, TOBRARND, AMIKACINPEAK, AMIKACINTROU, AMIKACIN in the last 72 hours.   Microbiology: No results found for this or any previous visit (from the past 720 hour(s)).  Medical History: Past Medical History  Diagnosis Date  . Hypertension   . Depression   . Diabetes mellitus   . GERD (gastroesophageal reflux disease)   . Obesity   . Coronary artery disease 08/2006    Unstable angina. PCI to distal codominant CFX with 2.5 x 20 Taxus DES...  . CHF (congestive heart failure)   . Sleep apnea     stopped cpap  . Arthritis   . Headache(784.0)   . Hx of echocardiogram     Echo (11/15):  Mild LVH, EF 55-60%, no RWMA, Gr 1 DD, mild AI, mild MR, mod LAE.     Medications:  See electronic med rec  Assessment: 69 y.o. female presents with AMS and hypoglycemia. Noted pt with a few recent admissions for respiratory failure/cdiff/septic shock. To begin zosyn for asp pna. LA 0.89. SCr 2.13, est CrCl 29 ml/min. WBC wnl. Tm 100.4.  Goal of Therapy:  resolution of infection  Plan:  Zosyn 3.375gm IV now over 30 min then 3.375gm IV q8h - subsequent doses over 4 hours Will f/u renal function, micro data, pt's clinical condition  Christoper Fabian, PharmD, BCPS Clinical pharmacist, pager  [phone] 2015-02-09,11:30 PM

## 2015-01-11 NOTE — ED Provider Notes (Signed)
CSN: [id number]     Arrival date & time 2015/01/22  2024 History   First MD Initiated Contact with Patient 22-Jan-2015 2033     Chief Complaint  Patient presents with  . Hypoglycemia   level V caveat: Altered mental status HPI Patient presents to the emergency room with altered mental status.  Patient has a history of diabetes, coronary artery disease, morbid obesity. According to EMS, they were called to the patient's house earlier in the day. She was found to be hypoglycemic. She was treated with D50 with resolution of her symptoms. She refused EMS transport. EMS was called back to the house later on this evening. There is no family here in the emergency department so I'm not sure who made the EMS call. EMS found that she was hypoglycemic, 32 once again. She was given another amp of D50. Blood sugar is now 191 however there has been no change in her mental status Past Medical History  Diagnosis Date  . Hypertension   . Depression   . Diabetes mellitus   . GERD (gastroesophageal reflux disease)   . Obesity   . Coronary artery disease 08/2006    Unstable angina. PCI to distal codominant CFX with 2.5 x 20 Taxus DES...  . CHF (congestive heart failure)   . Sleep apnea     stopped cpap  . Arthritis   . Headache(784.0)   . Hx of echocardiogram     Echo (11/15):  Mild LVH, EF 55-60%, no RWMA, Gr 1 DD, mild AI, mild MR, mod LAE.    Past Surgical History  Procedure Laterality Date  . Coronary stent placement  08/2006    CAD; stent placed @ [facility]  . Total abdominal hysterectomy    . Cardiac catheterization      x2            mcclean  . Lumbar fusion  02/02/2013  . Back surgery    . Right heart catheterization N/A 06/18/2014    Procedure: RIGHT HEART CATH;  Surgeon: Laurey Morale, MD;  Location: [facility] CATH LAB;  Service: Cardiovascular;  Laterality: N/A;  . Insertion of dialysis catheter Right 11/03/2014    Procedure: INSERTION OF DIALYSIS CATHETER;  Surgeon: Sherren Kerns, MD;  Location:  [facility] OR;  Service: Vascular;  Laterality: Right;   Family History  Problem Relation Age of Onset  . Heart attack Father   . Heart failure Father   . Anemia Mother   . Hypertension Mother    History  Substance Use Topics  . Smoking status: Never Smoker   . Smokeless tobacco: Never Used  . Alcohol Use: No   OB History    No data available     Review of Systems  Unable to perform ROS     Allergies  Review of patient's allergies indicates no known allergies.  Home Medications   Prior to Admission medications   Medication Sig Start Date End Date Taking? Authorizing Provider  aspirin EC 81 MG tablet Take 1 tablet (81 mg total) by mouth daily. 03/19/13  Yes Laurey Morale, MD  atorvastatin (LIPITOR) 40 MG tablet Take 1 tablet (40 mg total) by mouth daily at 6 PM. 11/25/14  Yes Mcarthur Rossetti Angiulli, PA-C  b complex vitamins tablet Take 1 tablet by mouth daily.   Yes Historical Provider, MD  carvedilol (COREG) 6.25 MG tablet Take 1 tablet (6.25 mg total) by mouth 2 (two) times daily. 12/12/14  Yes Laurey Morale, MD  Cholecalciferol (VITAMIN D PO) Take 1 tablet by mouth daily.   Yes Historical Provider, MD  clopidogrel (PLAVIX) 75 MG tablet Take 1 tablet (75 mg total) by mouth daily. 12/06/14  Yes Christiane Ha, MD  DULoxetine (CYMBALTA) 60 MG capsule Take 1 capsule (60 mg total) by mouth 2 (two) times daily. 11/25/14  Yes Daniel J Angiulli, PA-C  ferrous sulfate 325 (65 FE) MG tablet Take 1 tablet (325 mg total) by mouth 2 (two) times daily. 11/25/14  Yes Daniel J Angiulli, PA-C  glimepiride (AMARYL) 2 MG tablet Take 2 mg by mouth daily with breakfast.   Yes Historical Provider, MD  hydrALAZINE (APRESOLINE) 10 MG tablet Take 1 tablet (10 mg total) by mouth 3 (three) times daily. 12/24/14  Yes Laurey Morale, MD  isosorbide mononitrate (IMDUR) 30 MG 24 hr tablet Take 1 tablet (30 mg total) by mouth daily. 12/24/14  Yes Laurey Morale, MD  lisinopril (PRINIVIL,ZESTRIL) 20 MG tablet Take 20 mg  by mouth daily.   Yes Historical Provider, MD  metFORMIN (GLUCOPHAGE) 1000 MG tablet Take 1,000 mg by mouth 2 (two) times daily with a meal.   Yes Historical Provider, MD  Multiple Vitamins-Minerals (MULTIVITAMIN GUMMIES ADULTS) CHEW Chew 1 each by mouth daily.   Yes Historical Provider, MD  oxybutynin (DITROPAN-XL) 10 MG 24 hr tablet Take 10 mg by mouth at bedtime.   Yes Historical Provider, MD  pantoprazole (PROTONIX) 20 MG tablet Take 20 mg by mouth daily.   Yes Historical Provider, MD  potassium chloride SA (K-DUR,KLOR-CON) 20 MEQ tablet Take 1 tablet (20 mEq total) by mouth daily. 12/25/14  Yes Dolores Patty, MD  pregabalin (LYRICA) 75 MG capsule Take 75 mg by mouth 2 (two) times daily.   Yes Historical Provider, MD  rOPINIRole (REQUIP) 2 MG tablet Take 1 tablet (2 mg total) by mouth at bedtime. 11/25/14  Yes Daniel J Angiulli, PA-C  sodium bicarbonate 650 MG tablet Take 650 mg by mouth 2 (two) times daily.   Yes Historical Provider, MD  torsemide (DEMADEX) 20 MG tablet Take 4 tabs in AM and 2 tabs in PM Patient taking differently: Take 40-80 mg by mouth 2 (two) times daily. Take 4 tabs in AM and 2 tabs in PM 12/24/14  Yes Laurey Morale, MD  traZODone (DESYREL) 50 MG tablet Take 50 mg by mouth at bedtime.   Yes Historical Provider, MD  VITAMIN E PO Take 1 capsule by mouth daily.   Yes Historical Provider, MD  acetaminophen (TYLENOL) 325 MG tablet Take 2 tablets (650 mg total) by mouth every 6 (six) hours as needed for headache. 12/06/14   Christiane Ha, MD  Cholecalciferol (VITAMIN D3) 1000 UNITS CAPS Take 1 capsule (1,000 Units total) by mouth daily. 11/25/14   Mcarthur Rossetti Angiulli, PA-C  glimepiride (AMARYL) 1 MG tablet Take 1 tablet (1 mg total) by mouth daily with breakfast. 12/06/14   Christiane Ha, MD  magnesium chloride (SLOW-MAG) 64 MG TBEC SR tablet Take 1 tablet (64 mg total) by mouth daily. 12/06/14   Christiane Ha, MD   BP 74/56 mmHg  Pulse 84  Temp(Src) 98.8 F (37.1 C)   Resp 22  Ht  (1.6 m)  SpO2 100% Physical Exam  Constitutional:  Unresponsive with sonorous respirations  HENT:  Head: Normocephalic and atraumatic.  Right Ear: External ear normal.  Left Ear: External ear normal.  Mouth/Throat: No oropharyngeal exudate.  Mucous membranes are dry  Eyes: Conjunctivae are  normal. Right eye exhibits no discharge. Left eye exhibits no discharge. No scleral icterus.  Neck: Neck supple. No tracheal deviation present.  Cardiovascular: Normal rate, regular rhythm and intact distal pulses.   Pulmonary/Chest: Effort normal and breath sounds normal. No stridor. No respiratory distress. She has no wheezes. She has no rales.  Abdominal: Soft. Bowel sounds are normal. She exhibits no distension. There is no tenderness. There is no rebound and no guarding.  Musculoskeletal: She exhibits no edema or tenderness.  A few superficial ulcerations on her lower extremities, dirty bandages with some malodorous drainage  Neurological: She displays no tremor. No cranial nerve deficit (no facial droop,) or sensory deficit. She displays no seizure activity. GCS eye subscore is 1. GCS verbal subscore is 1. GCS motor subscore is 3.  Skin: Skin is warm and dry. No rash noted.  Psychiatric: She has a normal mood and affect.  Nursing note and vitals reviewed.   ED Course  INTUBATION Date/Time: 01/20/2015 9:03 PM Performed by: Linwood Dibbles Authorized by: Linwood Dibbles Consent: The procedure was performed in an emergent situation. Indications: airway protection Intubation method: video-assisted Patient status: paralyzed (RSI) Preoxygenation: nonrebreather mask Sedatives: etomidate Paralytic: succinylcholine Laryngoscope size: Mac 4 Tube size: 7.5 mm Tube type: cuffed Number of attempts: 1 Cricoid pressure: no Cords visualized: yes Post-procedure assessment: chest rise and ETCO2 monitor Breath sounds: equal Cuff inflated: yes ETT to lip: 22 cm Tube secured with: ETT  holder Chest x-ray interpreted by radiologist. Patient tolerance: Patient tolerated the procedure well with no immediate complications   (including critical care time) Labs Review Labs Reviewed  COMPREHENSIVE METABOLIC PANEL - Abnormal; Notable for the following:    Glucose, Bld 156 (*)    BUN 43 (*)    Creatinine, Ser 2.13 (*)    Calcium 8.1 (*)    Total Protein 5.5 (*)    Albumin 2.3 (*)    AST 14 (*)    ALT 9 (*)    GFR calc non Af Amer 23 (*)    GFR calc Af Amer 26 (*)    All other components within normal limits  CBC WITH DIFFERENTIAL/PLATELET - Abnormal; Notable for the following:    RBC 3.76 (*)    Hemoglobin 9.8 (*)    HCT 32.6 (*)    RDW 17.7 (*)    Neutrophils Relative % 84 (*)    Lymphocytes Relative 9 (*)    All other components within normal limits  AMMONIA - Abnormal; Notable for the following:    Ammonia 55 (*)    All other components within normal limits  CBG MONITORING, ED - Abnormal; Notable for the following:    Glucose-Capillary 153 (*)    All other components within normal limits  CBG MONITORING, ED - Abnormal; Notable for the following:    Glucose-Capillary 114 (*)    All other components within normal limits  APTT  PROTIME-INR  ETHANOL  URINALYSIS, ROUTINE W REFLEX MICROSCOPIC (NOT AT [facility])  I-STAT CG4 LACTIC ACID, ED  CBG MONITORING, ED  I-STAT ARTERIAL BLOOD GAS, ED  TYPE AND SCREEN    Imaging Review Dg Chest Portable 1 View  01/16/2015   CLINICAL DATA:  Hypoglycemia.  Found unresponsive.  EXAM: PORTABLE CHEST - 1 VIEW  COMPARISON:  12/02/2014  FINDINGS: Endotracheal tube tip just below the clavicular heads. The orogastric tube enters the stomach at least.  Cardiomegaly and main pulmonary artery prominence, similar to previous when accounting for differences in technique. Coronary artery calcification.  Perihilar lung opacity. No evidence of pleural fluid or air leak. No acute osseous findings.  IMPRESSION: 1. The endotracheal and orogastric  tubes are in good position. 2. Perihilar edema or aspiration.   Electronically Signed   By: Marnee Spring M.D.   On: 01/19/2015 21:15    Medications  0.9 %  sodium chloride infusion (0 mLs Intravenous Stopped 01/05/2015 2317)    Followed by  0.9 %  sodium chloride infusion (1,000 mLs Intravenous New Bag/Given 01/21/2015 2117)  dextrose 10 % infusion (not administered)  lidocaine (cardiac) 100 mg/48ml (XYLOCAINE) 20 MG/ML injection 2% (  Not Given 01/05/2015 2138)  rocuronium (ZEMURON) 50 MG/5ML injection (  Not Given 01/10/2015 2138)  midazolam (VERSED) injection 1 mg (not administered)  dextrose 50 % solution 50 mL (50 mLs Intravenous Given 01/18/2015 2025)  dextrose 50 % solution 25 g (0 g Intravenous Duplicate 12/31/2014 2137)  etomidate (AMIDATE) injection (20 mg Intravenous Given 01/20/2015 2050)  succinylcholine (ANECTINE) injection (120 mg Intravenous Given 12/24/2014 2051)   CRITICAL CARE Performed by: TUYWX,IPP Total critical care time: 35 Critical care time was exclusive of separately billable procedures and treating other patients. Critical care was necessary to treat or prevent imminent or life-threatening deterioration. Critical care was time spent personally by me on the following activities: development of treatment plan with patient and/or surrogate as well as nursing, discussions with consultants, evaluation of patient's response to treatment, examination of patient, obtaining history from patient or surrogate, ordering and performing treatments and interventions, ordering and review of laboratory studies, ordering and review of radiographic studies, pulse oximetry and re-evaluation of patient's condition.   MDM   Final diagnoses:  Glasgow coma scale total score 3-8    Pt with worsening renal insufficiency.  No longer hypoglycemic but remains altered.  CT ordered on initial assessment.  Pt is not getting her scan to rule out bleed.  Initial BP was normal.   Most recent BP documented is in  the 70s. WIll give fluid bolus and recheck.   ?seizure associated with hypoglyemia.  ?CVA.    Plan on admission to the ICU for further treatment.    Linwood Dibbles, MD 01/15/2015 2322

## 2015-01-11 NOTE — ED Notes (Signed)
Respiratory called for intubation  

## 2015-01-12 ENCOUNTER — Inpatient Hospital Stay (HOSPITAL_COMMUNITY): Payer: Medicare Other

## 2015-01-12 ENCOUNTER — Encounter (HOSPITAL_COMMUNITY): Payer: Medicare Other

## 2015-01-12 DIAGNOSIS — N179 Acute kidney failure, unspecified: Secondary | ICD-10-CM

## 2015-01-12 DIAGNOSIS — J810 Acute pulmonary edema: Secondary | ICD-10-CM

## 2015-01-12 DIAGNOSIS — J9601 Acute respiratory failure with hypoxia: Secondary | ICD-10-CM

## 2015-01-12 LAB — BLOOD GAS, ARTERIAL
Acid-base deficit: 1.7 mmol/L (ref 0.0–2.0)
Bicarbonate: 22.7 mEq/L (ref 20.0–24.0)
DRAWN BY: 331761
FIO2: 0.5 %
LHR: 18 {breaths}/min
O2 SAT: 96.3 %
PCO2 ART: 42.8 mmHg (ref 35.0–45.0)
PEEP: 5 cmH2O
PH ART: 7.355 (ref 7.350–7.450)
Patient temperature: 102.2
TCO2: 23.9 mmol/L (ref 0–100)
VT: 450 mL
pO2, Arterial: 91.5 mmHg (ref 80.0–100.0)

## 2015-01-12 LAB — GLUCOSE, CAPILLARY
GLUCOSE-CAPILLARY: 70 mg/dL (ref 65–99)
GLUCOSE-CAPILLARY: 130 mg/dL — AB (ref 65–99)
GLUCOSE-CAPILLARY: 141 mg/dL — AB (ref 65–99)
Glucose-Capillary: 67 mg/dL (ref 65–99)
Glucose-Capillary: 64 mg/dL — ABNORMAL LOW (ref 65–99)
Glucose-Capillary: 81 mg/dL (ref 65–99)
Glucose-Capillary: 104 mg/dL — ABNORMAL HIGH (ref 65–99)
Glucose-Capillary: 167 mg/dL — ABNORMAL HIGH (ref 65–99)
Glucose-Capillary: 128 mg/dL — ABNORMAL HIGH (ref 65–99)

## 2015-01-12 LAB — TROPONIN I
TROPONIN I: 0.11 ng/mL — AB (ref <0.031)
TROPONIN I: 0.1 ng/mL — AB (ref <0.031)

## 2015-01-12 LAB — BASIC METABOLIC PANEL
Anion gap: 12 (ref 5–15)
BUN: 41 mg/dL — ABNORMAL HIGH (ref 6–20)
CHLORIDE: 105 mmol/L (ref 101–111)
CO2: 23 mmol/L (ref 22–32)
Calcium: 8.1 mg/dL — ABNORMAL LOW (ref 8.9–10.3)
Creatinine, Ser: 2.1 mg/dL — ABNORMAL HIGH (ref 0.44–1.00)
GFR calc Af Amer: 27 mL/min — ABNORMAL LOW (ref >60)
GFR, EST NON AFRICAN AMERICAN: 23 mL/min — AB (ref >60)
Glucose, Bld: 144 mg/dL — ABNORMAL HIGH (ref 65–99)
Potassium: 4.5 mmol/L (ref 3.5–5.1)
SODIUM: 140 mmol/L (ref 135–145)

## 2015-01-12 LAB — COMPREHENSIVE METABOLIC PANEL
ALBUMIN: 2.7 g/dL — AB (ref 3.5–5.0)
ALK PHOS: 65 U/L (ref 38–126)
ALT: 10 U/L — AB (ref 14–54)
AST: 17 U/L (ref 15–41)
Anion gap: 13 (ref 5–15)
BILIRUBIN TOTAL: 0.5 mg/dL (ref 0.3–1.2)
BUN: 43 mg/dL — ABNORMAL HIGH (ref 6–20)
CO2: 24 mmol/L (ref 22–32)
CREATININE: 2.14 mg/dL — AB (ref 0.44–1.00)
Calcium: 8.4 mg/dL — ABNORMAL LOW (ref 8.9–10.3)
Chloride: 104 mmol/L (ref 101–111)
GFR calc Af Amer: 26 mL/min — ABNORMAL LOW (ref >60)
GFR calc non Af Amer: 23 mL/min — ABNORMAL LOW (ref >60)
Glucose, Bld: 74 mg/dL (ref 65–99)
Potassium: 4.5 mmol/L (ref 3.5–5.1)
Sodium: 141 mmol/L (ref 135–145)
Total Protein: 6.6 g/dL (ref 6.5–8.1)

## 2015-01-12 LAB — CBC
HEMATOCRIT: 32.8 % — AB (ref 36.0–46.0)
HEMOGLOBIN: 10 g/dL — AB (ref 12.0–15.0)
MCH: 26.1 pg (ref 26.0–34.0)
MCHC: 30.5 g/dL (ref 30.0–36.0)
MCV: 85.6 fL (ref 78.0–100.0)
Platelets: 264 10*3/uL (ref 150–400)
RBC: 3.83 MIL/uL — ABNORMAL LOW (ref 3.87–5.11)
RDW: 17.6 % — AB (ref 11.5–15.5)
WBC: 11.6 10*3/uL — AB (ref 4.0–10.5)

## 2015-01-12 LAB — MAGNESIUM: Magnesium: 1.4 mg/dL — ABNORMAL LOW (ref 1.7–2.4)

## 2015-01-12 LAB — MRSA PCR SCREENING: MRSA BY PCR: NEGATIVE

## 2015-01-12 LAB — LIPASE, BLOOD: Lipase: 11 U/L — ABNORMAL LOW (ref 22–51)

## 2015-01-12 LAB — PROCALCITONIN: Procalcitonin: <0.1 ng/mL

## 2015-01-12 LAB — PHOSPHORUS: Phosphorus: 4.8 mg/dL — ABNORMAL HIGH (ref 2.5–4.6)

## 2015-01-12 LAB — TSH: TSH: 1.694 u[IU]/mL (ref 0.350–4.500)

## 2015-01-12 LAB — AMYLASE: Amylase: 23 U/L — ABNORMAL LOW (ref 28–100)

## 2015-01-12 LAB — CORTISOL: Cortisol, Plasma: 41.7 ug/dL

## 2015-01-12 MED ORDER — CETYLPYRIDINIUM CHLORIDE 0.05 % MT LIQD
7.0000 mL | Freq: Four times a day (QID) | OROMUCOSAL | Status: DC
  Administered 2015-01-12 – 2015-01-17 (×20): 7 mL via OROMUCOSAL

## 2015-01-12 MED ORDER — FAMOTIDINE 40 MG/5ML PO SUSR
20.0000 mg | Freq: Every day | ORAL | Status: DC
  Administered 2015-01-12 – 2015-01-16 (×5): 20 mg
  Filled 2015-01-12 (×6): qty 2.5

## 2015-01-12 MED ORDER — CHLORHEXIDINE GLUCONATE 0.12 % MT SOLN
15.0000 mL | Freq: Two times a day (BID) | OROMUCOSAL | Status: DC
  Administered 2015-01-12 – 2015-01-17 (×11): 15 mL via OROMUCOSAL
  Filled 2015-01-12 (×11): qty 15

## 2015-01-12 MED ORDER — SODIUM CHLORIDE 0.9 % IV BOLUS (SEPSIS)
750.0000 mL | Freq: Once | INTRAVENOUS | Status: AC
  Administered 2015-01-12: 750 mL via INTRAVENOUS

## 2015-01-12 MED ORDER — ACETAMINOPHEN 160 MG/5ML PO SOLN
650.0000 mg | ORAL | Status: DC | PRN
  Administered 2015-01-12 – 2015-01-15 (×7): 650 mg
  Filled 2015-01-12 (×7): qty 20.3

## 2015-01-12 MED ORDER — HEPARIN SODIUM (PORCINE) 5000 UNIT/ML IJ SOLN
5000.0000 [IU] | Freq: Three times a day (TID) | INTRAMUSCULAR | Status: DC
  Administered 2015-01-12 – 2015-01-17 (×15): 5000 [IU] via SUBCUTANEOUS
  Filled 2015-01-12 (×18): qty 1

## 2015-01-12 MED ORDER — DEXTROSE 5 % IV SOLN
INTRAVENOUS | Status: DC
  Administered 2015-01-12: 10:00:00 via INTRAVENOUS

## 2015-01-12 MED ORDER — PRO-STAT SUGAR FREE PO LIQD
60.0000 mL | Freq: Two times a day (BID) | ORAL | Status: DC
  Administered 2015-01-12 – 2015-01-16 (×10): 60 mL
  Filled 2015-01-12 (×12): qty 60

## 2015-01-12 MED ORDER — VITAL HIGH PROTEIN PO LIQD
1000.0000 mL | ORAL | Status: DC
  Filled 2015-01-12 (×3): qty 1000

## 2015-01-12 MED ORDER — VITAL HIGH PROTEIN PO LIQD
1000.0000 mL | ORAL | Status: DC
  Administered 2015-01-12 – 2015-01-15 (×5): 1000 mL
  Administered 2015-01-16: 20:00:00
  Administered 2015-01-16: 1000 mL
  Administered 2015-01-17 (×2)
  Filled 2015-01-12 (×6): qty 1000

## 2015-01-12 MED ORDER — PIPERACILLIN-TAZOBACTAM 3.375 G IVPB
3.3750 g | Freq: Three times a day (TID) | INTRAVENOUS | Status: DC
  Administered 2015-01-12 – 2015-01-13 (×3): 3.375 g via INTRAVENOUS
  Filled 2015-01-12 (×5): qty 50

## 2015-01-12 NOTE — Procedures (Signed)
ELECTROENCEPHALOGRAM REPORT   Patient: Krista Wise       Room #: [id number] EEG No. ID: [id number] Age: 69 y.o.        Sex: female Referring Physician: Tyson Alias Report Date:  01/12/2015        Interpreting Physician: Thana Farr  History: [name] is an 69 y.o. female with altered mental status  Medications:  Scheduled: . antiseptic oral rinse  7 mL Mouth Rinse QID  . chlorhexidine  15 mL Mouth Rinse BID  . famotidine  20 mg Per Tube Daily  . feeding supplement (PRO-STAT SUGAR FREE 64)  60 mL Per Tube BID  . feeding supplement (VITAL HIGH PROTEIN)  1,000 mL Per Tube Q24H  . heparin subcutaneous  5,000 Units Subcutaneous 3 times per day  . piperacillin-tazobactam (ZOSYN)  IV  3.375 g Intravenous 3 times per day    Conditions of Recording:  This is a 16 channel EEG carried out with the patient in the intubated and sedated state.  Description:  The background activity is dominated by a low voltage polymorphic delta activity that is diffusely distributed.  At times some intermixed theta activity is noted as well that is poorly organized.  Intermittently is noted periods of attenuation lasting 2-3 seconds that are generalized.  These are frequently seen during the recording and give the recording a burst-suppression feel with periods of activity being more common than periods of attenuation.  On rare occasions are also noted frontal sharp transients with phase reversal most commonly at F4.  These transients have a triphasic morphology.   Hyperventilation and intermittent photic stimulation were not performed.   IMPRESSION: This is an abnormal electroencephalogram secondary to burst-suppression activity which is can be seen as a medication effect.  Triphasic activity is noted as well consistent with an encephalopathy, etiology nonspecific.       Thana Farr, MD [facility] [phone] 01/12/2015, 3:52 PM

## 2015-01-12 NOTE — Progress Notes (Signed)
2013 called Elink to notify MD patient temperature 102.2. Orders given to administer tylenol 650 mg q 4 PRN per Dr. Kendrick Fries.

## 2015-01-12 NOTE — Progress Notes (Signed)
PULMONARY / CRITICAL CARE MEDICINE   Name: Krista Wise MRN: [id number] DOB: 1946/07/16    ADMISSION DATE:  01/20/2015 CONSULTATION DATE:  01/01/2015  REFERRING MD :  Lynelle Doctor, MD  CHIEF COMPLAINT:  Change in MS  INITIAL PRESENTATION:  58 F admitted 6/19 to ICU/PCCM service with profound hypoglycemia and AMS requiring intubation  MAJOR EVENTS/TEST RESULTS: 6/19 CT head: NAICP 6/20 MRI brain: Abnormal restricted diffusion involving the splenium of the corpus callosum, with question of more subtle diffusion and T2/FLAIR abnormality within the bilateral occipital regions. Finding is nonspecific, but can be seen in the setting of hypoglycemic brain injury. Possible cerebral ischemia, posterior reversible encephalopathy syndrome (PRES), or encephalitis, either infectious or inflammatory, could also be considered, although these are felt to be less likely 6/20 EEG: This is an abnormal electroencephalogram secondary to burst-suppression activity which is can be seen as a medication effect. Triphasic activity is noted as well consistent with an encephalopathy, etiology nonspecific  INDWELLING DEVICES:: ETT 6/19 >>   MICRO DATA: MRSA 6/19 >> NEG Resp 6/20 >>  Blood 6/20 >>   ANTIMICROBIALS:  Zosyn 6/19 >>    SUBJECTIVE:  Comatose  VITAL SIGNS: Temp:  [98.2 F (36.8 C)-101.8 F (38.8 C)] 100.4 F (38 C) (06/20 1700) Pulse Rate:  [72-93] 83 (06/20 1700) Resp:  [13-24] 24 (06/20 1700) BP: (71-137)/(43-112) 112/56 mmHg (06/20 1700) SpO2:  [97 %-100 %] 100 % (06/20 1700) FiO2 (%):  [40 %-100 %] 40 % (06/20 1510) HEMODYNAMICS:   VENTILATOR SETTINGS: Vent Mode:  [-] CPAP;PSV FiO2 (%):  [40 %-100 %] 40 % Set Rate:  [12 bmp-18 bmp] 18 bmp Vt Set:  [450 mL-500 mL] 450 mL PEEP:  [5 cmH20] 5 cmH20 Pressure Support:  [8 cmH20] 8 cmH20 Plateau Pressure:  [19 cmH20-20 cmH20] 19 cmH20 INTAKE / OUTPUT:  Intake/Output Summary (Last 24 hours) at 01/12/15 1757 Last data filed at 01/12/15  1700  Gross per 24 hour  Intake 2909.25 ml  Output   1580 ml  Net 1329.25 ml    PHYSICAL EXAMINATION: General:   Neuro: Comatose, +/- decerebrate posturing, incomplete oculocephalic reflexes HEENT:NCAT Cardiovascular: reg, no M Lungs: clear Abdomen: soft, +BS Ext: symmetric edema  LABS:  CBC  Recent Labs Lab 12/29/2014 2043 01/12/15 0810  WBC 8.7 11.6*  HGB 9.8* 10.0*  HCT 32.6* 32.8*  PLT 267 264   Coag's  Recent Labs Lab 01/10/2015 2043  APTT 31  INR 1.17   BMET  Recent Labs Lab 01/19/2015 2043 01/12/15 0352 01/12/15 0810  NA 140 141 140  K 4.4 4.5 4.5  CL 103 104 105  CO2 BUN 43* 43* 41*  CREATININE 2.13* 2.14* 2.10*  GLUCOSE 156* 74 144*   Electrolytes  Recent Labs Lab 01/01/2015 2043 01/12/15 0352 01/12/15 0810  CALCIUM 8.1* 8.4* 8.1*  MG  --   --  1.4*  PHOS  --   --  4.8*   Sepsis Markers  Recent Labs Lab 01/04/2015 2053 01/12/15 1017  LATICACIDVEN 0.89  --   PROCALCITON  --  <0.10   ABG  Recent Labs Lab 01/12/15 0408  PHART 7.355  PCO2ART 42.8  PO2ART 91.5   Liver Enzymes  Recent Labs Lab 12/24/2014 2043 01/12/15 0352  AST 14* 17  ALT 9* 10*  ALKPHOS 61 65  BILITOT 0.5 0.5  ALBUMIN 2.3* 2.7*   Cardiac Enzymes  Recent Labs Lab 01/12/15 0352 01/12/15 0810  TROPONINI 0.11* 0.10*   Glucose  Recent Labs  Lab 01/12/15 0551 01/12/15 0651 01/12/15 0759 01/12/15 0906 01/12/15 1142 01/12/15 1528  GLUCAP 70 81 104* 167* 128* 130*    CXR: edema pattern    ASSESSMENT / PLAN:  PULMONARY A:  Acute resp failure due to AMS OHS/OSA Pulm edema, mild P:   Cont full vent support - settings reviewed and/or adjusted Cont vent bundle Daily SBT if/when meets criteria Follow CXR  CARDIOVASCULAR A:  No issues P:  Monitor  RENAL A:   AKI CKD P:   Monitor BMET intermittently Monitor I/Os Correct electrolytes as indicated  GASTROINTESTINAL A:   No issues P:   SUP: enteral famotidine Begin TFs  6/20  HEMATOLOGIC A:   No issues P:  DVT px: SQ heparin Monitor CBC intermittently Transfuse per usual ICU guidelines  INFECTIOUS A:   Doubt PNA P:   Monitor temp, WBC count Micro and abx as above Check PCT in AM 6/21  ENDOCRINE A:   DM 2 Hypoglycemia, resolved P:   Holding home meds Cont to monitor CBG SSI for glu > 200  NEUROLOGIC A: Severe hypoglycemic brain injury Coma P:   RASS goal: 0 Neurology following I informed family that we should provide support for 72-96 hrs and re-assess prognosis     FAMILY updates: husband and daughter @ bedside   CCM X 45 mins   Billy Fischer, MD ; [facility] service Mobile [phone].  After 5:30 PM or weekends, call [phone] Pulmonary and Critical Care Medicine [facility] Pager: [phone]  01/12/2015, 5:57 PM

## 2015-01-12 NOTE — Progress Notes (Signed)
EEG Completed; Results Pending  

## 2015-01-12 NOTE — Progress Notes (Signed)
Initial Nutrition Assessment  DOCUMENTATION CODES:  Obesity unspecified  INTERVENTION:  Initiate Vital High Protein @ 35 ml/hr via OG tube   60 ml Prostat BID  Tube feeding regimen provides 1240 kcal, 133 grams of protein, and 702 ml of H2O.    NUTRITION DIAGNOSIS:  Inadequate oral intake related to inability to eat as evidenced by NPO status.   GOAL:  Provide needs based on ASPEN/SCCM guidelines   MONITOR:  TF tolerance, I & O's, Labs, Vent status  REASON FOR ASSESSMENT:  Consult Enteral/tube feeding initiation and management  ASSESSMENT:  Pt with hx of CHF and DM who had a recent hospitalization x 7 weeks for sepsis (c.diff positive) per family. Pt no longer having diarrhea. Pt noted to have recurrent hypoglycemia and admitted after glucose did not improve her mental status. Etiology unclear, work up ongoing.   Patient is currently intubated on ventilator support. Has OG (per xray in good position) MV: 9.9 L/min Temp (24hrs), Avg:100.4 F (38 C), Min:98.2 F (36.8 C), Max:101.8 F (38.8 C)  Per family pt usually has edema on legs. (Husband, sister, and granddaughter at bedside) Nutrition-Focused physical exam completed. Findings are no fat depletion, no muscle depletion, and severe edema.  Labs reviewed: BUN/Cr, Phosphorus elevated, Magnesium low Medications reviewed.  Pt discussed during ICU rounds and with RN.   Height:  Ht Readings from Last 1 Encounters:  12-Jan-2015 5\' 3"  (1.6 m)    Weight:  Wt Readings from Last 1 Encounters:  12/24/14 234 lb (106.142 kg)    Ideal Body Weight:  52.7 kg  Wt Readings from Last 10 Encounters:  12/24/14 234 lb (106.142 kg)  12/16/14 220 lb 6.4 oz (99.973 kg)  12/12/14 222 lb 12 oz (101.039 kg)  12/08/14 223 lb 14.4 oz (101.56 kg)  12/06/14 229 lb 4.8 oz (104.01 kg)  11/25/14 211 lb 10.6 oz (96.01 kg)  11/13/14 201 lb 11.2 oz (91.491 kg)  10/12/14 238 lb (107.956 kg)  09/11/14 247 lb 8 oz (112.265 kg)   08/01/14 247 lb (112.038 kg)    BMI:  41.5  Estimated Nutritional Needs:  Kcal:  7253-6644  Protein:  >/= 131 grams  Fluid:  > 1.5 L/day  Skin:  Wound (see comment) (MASD - groin)  Diet Order:  Diet NPO time specified  EDUCATION NEEDS:  No education needs identified at this time   Intake/Output Summary (Last 24 hours) at 01/12/15 1028 Last data filed at 01/12/15 1000  Gross per 24 hour  Intake   1600 ml  Output   1125 ml  Net    475 ml    Last BM:  PTA  Kendell Bane RD, LDN, CNSC [phone] Pager [phone] After Hours Pager

## 2015-01-12 NOTE — Progress Notes (Signed)
ABG was drawn @ 2212 by RT. RT was called asking for a ABG to be drawn for post intubation. RT explained the ABG had been done and the ISTAT machine had been docked for upload. However, the results did not transfer to [facility]. RT attempted to retransmit the information from the [facility]. The results would not cross over. ABG results from 2216 results: pH: 7.402, CO2: 42.5, HC03: 26.5, PO2: >450.

## 2015-01-12 NOTE — Care Management Note (Signed)
Case Management Note  Patient Details  Name: Krista Wise MRN: [id number] Date of Birth: [birth year]  Subjective/Objective:       Pt admitted on 01/15/15 with AMS, hypoglycemia.  PTA, pt resided at home with spouse.               Action/Plan: Will follow for discharge planning as pt progresses.    Expected Discharge Date:                  Expected Discharge Plan:  IP Rehab Facility  In-House Referral:  Clinical Social Work  Discharge planning Services  CM Consult  Post Acute Care Choice:    Choice offered to:     DME Arranged:    DME Agency:     HH Arranged:    HH Agency:     Status of Service:  In process, will continue to follow  Medicare Important Message Given:    Date Medicare IM Given:    Medicare IM give by:    Date Additional Medicare IM Given:    Additional Medicare Important Message give by:     If discussed at Long Length of Stay Meetings, dates discussed:    Additional Comments:  Quintella Baton, RN, BSN  Trauma/Neuro ICU Case Manager [phone]

## 2015-01-12 NOTE — Consult Note (Addendum)
Consult Reason for Consult: altered mental status Referring Physician: Dr Tyson Alias  CC: altered mental status  HPI: Krista Wise is an 69 y.o. female hx of HTN, DM presenting with altered mental status in setting of recurrent episodes of hypoglycemia. Per husband, this morning he noted she was lethargic and not waking up. He checked her blood sugar and it was 48. Gave glucose and she returned to her baseline. She stayed in bed most of the day, then around 1830 he noted she was again unresponsive. He noted, "slow labored breathing with a lot of pauses". EMS called, noted blood sugar of 32. Gave D50 with no subsequent improvement in mental status. Blood sugar 191 in the ED with no improvement in mental status. No seizure activity noted.   CT head imaging reviewed, shows no acute process. No prior CVA or seizure history. Family denies any history of hypoglycemia, report she is typically hyperglycemic.   Past Medical History  Diagnosis Date  . Hypertension   . Depression   . Diabetes mellitus   . GERD (gastroesophageal reflux disease)   . Obesity   . Coronary artery disease 08/2006    Unstable angina. PCI to distal codominant CFX with 2.5 x 20 Taxus DES...  . CHF (congestive heart failure)   . Sleep apnea     stopped cpap  . Arthritis   . Headache(784.0)   . Hx of echocardiogram     Echo (11/15):  Mild LVH, EF 55-60%, no RWMA, Gr 1 DD, mild AI, mild MR, mod LAE.     Past Surgical History  Procedure Laterality Date  . Coronary stent placement  08/2006    CAD; stent placed @ [facility]  . Total abdominal hysterectomy    . Cardiac catheterization      x2            mcclean  . Lumbar fusion  02/02/2013  . Back surgery    . Right heart catheterization N/A 06/18/2014    Procedure: RIGHT HEART CATH;  Surgeon: Laurey Morale, MD;  Location: [facility] CATH LAB;  Service: Cardiovascular;  Laterality: N/A;  . Insertion of dialysis catheter Right 11/03/2014    Procedure: INSERTION OF DIALYSIS  CATHETER;  Surgeon: Sherren Kerns, MD;  Location: [facility] OR;  Service: Vascular;  Laterality: Right;    Family History  Problem Relation Age of Onset  . Heart attack Father   . Heart failure Father   . Anemia Mother   . Hypertension Mother     Social History:  reports that she has never smoked. She has never used smokeless tobacco. She reports that she does not drink alcohol or use illicit drugs.  No Known Allergies  Medications: I have reviewed the patient's current medications.  ROS: Out of a complete 14 system review, the patient complains of only the following symptoms, and all other reviewed systems are negative. Unable to assess due to mental status  Physical Examination: Filed Vitals:   01/10/2015 2345  BP:   Pulse: 88  Temp:   Resp: 20   Physical Exam  Constitutional: He appears well-developed and well-nourished.  Psych: intubated  Eyes: No scleral injection HENT: No OP obstrucion Head: Normocephalic.  Cardiovascular: Normal rate and regular rhythm.  Respiratory: Effort normal and breath sounds normal.  GI: Soft. Bowel sounds are normal. No distension. There is no tenderness.  Skin: WDI  Neurologic Examination Mental Status: Intubated, recently received etomidate, versed and succinylcholine for intubation. Non-verbal, eyes closed, not following commands.  Occasional posturing type movement spontaneously and to noxious stimuli Cranial Nerves: II: optic discs not visualized. OD 2.42mm and sluggishly reactive, OS 86mm and sluggishly reactive  III,IV, VI: eyes midline, no gaze deviation or nystagmus, dolls eye wnl V,VII: face symmetric, facial light touch sensation normal bilaterally, weak corneal reflex VIII: unable to test IX,X: absent gag/cough XI: unable to test XII: unable to test Motor: No spontaneous movement, no movement to command or noxious stimuli Sensory: no withdrawal to noxious stimuli, some posturing noted to noxious stimuli Deep Tendon  Reflexes: 1+ and symmetric throughout Plantars: Right: downgoing   Left: downgoing Cerebellar: Unable to test Gait: unable to test  Laboratory Studies:   Basic Metabolic Panel:  Recent Labs Lab January 20, 2015 2043  NA 140  K 4.4  CL 103  CO2 25  GLUCOSE 156*  BUN 43*  CREATININE 2.13*  CALCIUM 8.1*    Liver Function Tests:  Recent Labs Lab 01-20-15 2043  AST 14*  ALT 9*  ALKPHOS 61  BILITOT 0.5  PROT 5.5*  ALBUMIN 2.3*   No results for input(s): LIPASE, AMYLASE in the last 168 hours.  Recent Labs Lab 2015-01-20 2044  AMMONIA 55*    CBC:  Recent Labs Lab 2015-01-20 2043  WBC 8.7  NEUTROABS 7.3  HGB 9.8*  HCT 32.6*  MCV 86.7  PLT 267    Cardiac Enzymes: No results for input(s): CKTOTAL, CKMB, CKMBINDEX, TROPONINI in the last 168 hours.  BNP: Invalid input(s): POCBNP  CBG:  Recent Labs Lab 01-20-15 2026 01/20/2015 2038 01-20-2015 2119 Jan 20, 2015 2342  GLUCAP 71 153* 114* 80    Microbiology: Results for orders placed or performed during the hospital encounter of 11/26/14  Blood Culture (routine x 2)     Status: None   Collection Time: 11/26/14 10:00 AM  Result Value Ref Range Status   Specimen Description BLOOD ARM LEFT  Final   Special Requests BOTTLES DRAWN AEROBIC AND ANAEROBIC 10CC  Final   Culture   Final    NO GROWTH 5 DAYS Performed at [facility]    Report Status 12/02/2014 FINAL  Final  Blood Culture (routine x 2)     Status: None   Collection Time: 11/26/14 10:25 AM  Result Value Ref Range Status   Specimen Description BLOOD HAND LEFT  Final   Special Requests BOTTLES DRAWN AEROBIC ONLY 3CC  Final   Culture   Final    NO GROWTH 5 DAYS Performed at [facility]    Report Status 12/02/2014 FINAL  Final  Urine culture     Status: None   Collection Time: 11/26/14 10:36 AM  Result Value Ref Range Status   Specimen Description URINE, CATHETERIZED  Final   Special Requests NONE  Final   Colony Count   Final     >=100,000 COLONIES/ML Performed at [facility]    Culture   Final    ENTEROBACTER CLOACAE Performed at [facility]    Report Status 11/28/2014 FINAL  Final   Organism ID, Bacteria ENTEROBACTER CLOACAE  Final      Susceptibility   Enterobacter cloacae - MIC*    CEFAZOLIN >=64 RESISTANT Resistant     CEFTRIAXONE 32 INTERMEDIATE Intermediate     CIPROFLOXACIN <=0.25 SENSITIVE Sensitive     GENTAMICIN <=1 SENSITIVE Sensitive     LEVOFLOXACIN <=0.12 SENSITIVE Sensitive     NITROFURANTOIN 32 SENSITIVE Sensitive     TOBRAMYCIN <=1 SENSITIVE Sensitive     TRIMETH/SULFA <=20 SENSITIVE Sensitive  PIP/TAZO 64 INTERMEDIATE Intermediate     * ENTEROBACTER CLOACAE  Clostridium Difficile by PCR     Status: Abnormal   Collection Time: 11/26/14 10:36 AM  Result Value Ref Range Status   C difficile by pcr POSITIVE (A) NEGATIVE Final    Comment: CRITICAL RESULT CALLED TO, READ BACK BY AND VERIFIED WITH: BDalbert Garnet RN 12:50 11/26/14 ([email or web address])   MRSA PCR Screening     Status: None   Collection Time: 11/26/14  4:14 PM  Result Value Ref Range Status   MRSA by PCR NEGATIVE NEGATIVE Final    Comment:        The GeneXpert MRSA Assay (FDA approved for NASAL specimens only), is one component of a comprehensive MRSA colonization surveillance program. It is not intended to diagnose MRSA infection nor to guide or monitor treatment for MRSA infections.     Coagulation Studies:  Recent Labs  01/20/2015 2043  LABPROT 15.1  INR 1.17    Urinalysis:  Recent Labs Lab 12/31/2014 2115  COLORURINE YELLOW  LABSPEC 1.013  PHURINE 5.0  GLUCOSEU NEGATIVE  HGBUR NEGATIVE  BILIRUBINUR NEGATIVE  KETONESUR NEGATIVE  PROTEINUR NEGATIVE  UROBILINOGEN 0.2  NITRITE NEGATIVE  LEUKOCYTESUR LARGE*    Lipid Panel:     Component Value Date/Time   CHOL 95 10/29/2014 0247   TRIG 188* 10/29/2014 0247   HDL 21* 10/29/2014 0247   CHOLHDL 4.5 10/29/2014 0247   VLDL 38 10/29/2014 0247    LDLCALC 36 10/29/2014 0247    HgbA1C:  Lab Results  Component Value Date   HGBA1C 7.6* 10/29/2014    Urine Drug Screen:  No results found for: LABOPIA, COCAINSCRNUR, LABBENZ, AMPHETMU, THCU, LABBARB  Alcohol Level:  Recent Labs Lab 01/15/2015 2044  [facility] <5    Imaging: Ct Head Wo Contrast  01/15/2015   CLINICAL DATA:  Acute onset of hypoglycemia. Respiratory arrest. Initial encounter.  EXAM: CT HEAD WITHOUT CONTRAST  TECHNIQUE: Contiguous axial images were obtained from the base of the skull through the vertex without intravenous contrast.  COMPARISON:  CT of the head performed 02/07/2012  FINDINGS: There is no evidence of acute infarction, mass lesion, or intra- or extra-axial hemorrhage on CT.  Prominence of the ventricles and sulci suggests mild cortical volume loss. Mild cerebellar atrophy is noted.  The brainstem and fourth ventricle are within normal limits. The basal ganglia are unremarkable in appearance. The cerebral hemispheres demonstrate grossly normal gray-white differentiation. No mass effect or midline shift is seen.  There is no evidence of fracture; visualized osseous structures are unremarkable in appearance. The orbits are within normal limits. The paranasal sinuses and mastoid air cells are well-aerated. No significant soft tissue abnormalities are seen.  IMPRESSION: 1. No acute intracranial pathology seen on CT. 2. Mild cortical volume loss noted.   Electronically Signed   By: Roanna Raider M.D.   On: 01/13/2015 23:47   Dg Chest Portable 1 View  01/05/2015   CLINICAL DATA:  Hypoglycemia.  Found unresponsive.  EXAM: PORTABLE CHEST - 1 VIEW  COMPARISON:  12/02/2014  FINDINGS: Endotracheal tube tip just below the clavicular heads. The orogastric tube enters the stomach at least.  Cardiomegaly and main pulmonary artery prominence, similar to previous when accounting for differences in technique. Coronary artery calcification.  Perihilar lung opacity. No evidence of pleural fluid  or air leak. No acute osseous findings.  IMPRESSION: 1. The endotracheal and orogastric tubes are in good position. 2. Perihilar edema or aspiration.   Electronically Signed  By: Marnee Spring M.D.   On: February 05, 2015 21:15     Assessment/Plan:  69y/o woman hx of HTN, DM admitted with altered mental status in the setting of recurrent hypoglycemic episodes. Unclear etiology of symptoms. Differential includes hypoglycemic encephalopathy vs seizure/post ictal vs vs anoxic injury vs CVA. Profoundly depressed mental status raises concern for possible brainstem/posterior circulation CVA.  -stat MRI brain -EEG  -further workup pending above results.   Elspeth Cho, DO [facility] [phone]  If 7pm- 7am, please page neurology on call as listed in AMION. 01/12/2015, 12:01 AM

## 2015-01-12 NOTE — Progress Notes (Signed)
eLink Physician-Brief Progress Note Patient Name: PETRONA RUCKEL DOB: Feb 01, 1946 MRN: [id number]   Date of Service  01/12/2015  HPI/Events of Note  Fever, on antibiotics, cultured today  eICU Interventions  Prn APAP     Intervention Category Minor Interventions: Routine modifications to care plan (e.g. PRN medications for pain, fever)  MCQUAID, DOUGLAS 01/12/2015, 8:14 PM

## 2015-01-13 ENCOUNTER — Inpatient Hospital Stay (HOSPITAL_COMMUNITY): Payer: Medicare Other

## 2015-01-13 DIAGNOSIS — R4020 Unspecified coma: Secondary | ICD-10-CM

## 2015-01-13 DIAGNOSIS — E162 Hypoglycemia, unspecified: Secondary | ICD-10-CM

## 2015-01-13 DIAGNOSIS — J810 Acute pulmonary edema: Secondary | ICD-10-CM

## 2015-01-13 LAB — GLUCOSE, CAPILLARY
GLUCOSE-CAPILLARY: 217 mg/dL — AB (ref 65–99)
GLUCOSE-CAPILLARY: 189 mg/dL — AB (ref 65–99)
Glucose-Capillary: 192 mg/dL — ABNORMAL HIGH (ref 65–99)
Glucose-Capillary: 193 mg/dL — ABNORMAL HIGH (ref 65–99)
Glucose-Capillary: 263 mg/dL — ABNORMAL HIGH (ref 65–99)
Glucose-Capillary: 310 mg/dL — ABNORMAL HIGH (ref 65–99)
Glucose-Capillary: 316 mg/dL — ABNORMAL HIGH (ref 65–99)

## 2015-01-13 LAB — CBC
HCT: 31.2 % — ABNORMAL LOW (ref 36.0–46.0)
Hemoglobin: 9.5 g/dL — ABNORMAL LOW (ref 12.0–15.0)
MCH: 26 pg (ref 26.0–34.0)
MCHC: 30.4 g/dL (ref 30.0–36.0)
MCV: 85.5 fL (ref 78.0–100.0)
PLATELETS: 205 10*3/uL (ref 150–400)
RBC: 3.65 MIL/uL — ABNORMAL LOW (ref 3.87–5.11)
RDW: 17.3 % — AB (ref 11.5–15.5)
WBC: 10.2 10*3/uL (ref 4.0–10.5)

## 2015-01-13 LAB — BASIC METABOLIC PANEL
Anion gap: 9 (ref 5–15)
BUN: 50 mg/dL — ABNORMAL HIGH (ref 6–20)
CO2: 23 mmol/L (ref 22–32)
Calcium: 8 mg/dL — ABNORMAL LOW (ref 8.9–10.3)
Chloride: 104 mmol/L (ref 101–111)
Creatinine, Ser: 1.98 mg/dL — ABNORMAL HIGH (ref 0.44–1.00)
GFR calc Af Amer: 29 mL/min — ABNORMAL LOW (ref >60)
GFR calc non Af Amer: 25 mL/min — ABNORMAL LOW (ref >60)
GLUCOSE: 206 mg/dL — AB (ref 65–99)
Potassium: 3.8 mmol/L (ref 3.5–5.1)
SODIUM: 136 mmol/L (ref 135–145)

## 2015-01-13 LAB — PROCALCITONIN: Procalcitonin: 0.13 ng/mL

## 2015-01-13 MED ORDER — SODIUM CHLORIDE 0.9 % IV SOLN
INTRAVENOUS | Status: DC
  Administered 2015-01-13: 01:00:00 via INTRAVENOUS

## 2015-01-13 MED ORDER — SODIUM CHLORIDE 0.9 % IV SOLN
INTRAVENOUS | Status: DC
  Administered 2015-01-13: 2.5 [IU]/h via INTRAVENOUS
  Filled 2015-01-13: qty 2.5

## 2015-01-13 MED ORDER — POTASSIUM CHLORIDE 20 MEQ/15ML (10%) PO SOLN
40.0000 meq | Freq: Three times a day (TID) | ORAL | Status: AC
  Administered 2015-01-13 (×2): 40 meq
  Filled 2015-01-13 (×2): qty 30

## 2015-01-13 MED ORDER — FUROSEMIDE 10 MG/ML IJ SOLN
40.0000 mg | Freq: Three times a day (TID) | INTRAMUSCULAR | Status: AC
Start: 2015-01-13 — End: 2015-01-13
  Administered 2015-01-13 (×2): 40 mg via INTRAVENOUS
  Filled 2015-01-13 (×2): qty 4

## 2015-01-13 MED ORDER — INSULIN ASPART 100 UNIT/ML ~~LOC~~ SOLN
1.0000 [IU] | SUBCUTANEOUS | Status: DC
Start: 2015-01-13 — End: 2015-01-13

## 2015-01-13 MED ORDER — INSULIN ASPART 100 UNIT/ML ~~LOC~~ SOLN
4.0000 [IU] | Freq: Once | SUBCUTANEOUS | Status: AC
  Administered 2015-01-13: 4 [IU] via SUBCUTANEOUS

## 2015-01-13 NOTE — Progress Notes (Signed)
CBG 316, insulin drip ordered per protocol.   Krista Wise

## 2015-01-13 NOTE — Progress Notes (Signed)
NEURO HOSPITALIST PROGRESS NOTE   SUBJECTIVE:                                                                                                                        Remains intubated on the vent, but intermittently open her eyes. Doesn't follow commands. Nurse reports some movements lower extremities and right arm upon painful stimulation. 6/20 MRI brain: "Abnormal restricted diffusion involving the splenium of the corpus callosum, with question of more subtle diffusion and T2/FLAIR abnormality within the bilateral occipital regions. Finding is nonspecific, but can be seen in the setting of hypoglycemic brain" EEG 6/20" abnormal electroencephalogram secondary to burst-suppression activity which is can be seen as a medication effect. Triphasic activity is noted as well consistent with an encephalopathy, etiology nonspecific".   OBJECTIVE:                                                                                                                           Vital signs in last 24 hours: Temp:  [100 F (37.8 C)-102.2 F (39 C)] 100.2 F (37.9 C) (06/21 1000) Pulse Rate:  [72-91] 91 (06/21 1000) Resp:  [15-25] 22 (06/21 1000) BP: (91-130)/(43-68) 122/67 mmHg (06/21 1000) SpO2:  [96 %-100 %] 96 % (06/21 1000) FiO2 (%):  [40 %] 40 % (06/21 0739) Weight:  [115 kg (253 lb 8.5 oz)] 115 kg (253 lb 8.5 oz) (06/21 0500)  Intake/Output from previous day: 06/20 0701 - 06/21 0700 In: 2553.3 [I.V.:794; NG/GT:859.3; IV Piggyback:900] Out: 1380 [Urine:1380] Intake/Output this shift: Total I/O In: 260 [I.V.:30; NG/GT:230] Out: 125 [Urine:125] Nutritional status: Diet NPO time specified  Past Medical History  Diagnosis Date  . Hypertension   . Depression   . Diabetes mellitus   . GERD (gastroesophageal reflux disease)   . Obesity   . Coronary artery disease 08/2006    Unstable angina. PCI to distal codominant CFX with 2.5 x 20 Taxus DES...  . CHF  (congestive heart failure)   . Sleep apnea     stopped cpap  . Arthritis   . Headache(784.0)   . Hx of echocardiogram     Echo (11/15):  Mild LVH, EF 55-60%, no RWMA, Gr 1 DD, mild AI, mild MR, mod LAE.  Physical exam: acutely ill, intubated on the vent. Constitutional: She appears well-developed and well-nourished.  Psych: intubated  Eyes: No scleral injection HENT: No OP obstrucion Head: Normocephalic.  Cardiovascular: Normal rate and regular rhythm.  Respiratory: Effort normal and breath sounds normal.  GI: Soft. Bowel sounds are normal. No distension. There is no tenderness.  Skin: WDI  Neurologic Exam:  Mental status: intubated on the vent, open eyes upon painful stimuli and remains with eyes open but is not following commands or tracking me in the room CN 2-12: pupils 4 mm, reactive. No gaze preference. Corneal reflexes present. Face appears symmetric. Motor: moves all extremities except left arm upon painful stimuli Sensory: reacts to pain DTR's: trace all over. Plantars: down Coordination and gait: unable to elicit.  Lab Results: Lab Results  Component Value Date/Time   CHOL 95 10/29/2014 02:47 AM   Lipid Panel No results for input(s): CHOL, TRIG, HDL, CHOLHDL, VLDL, LDLCALC in the last 72 hours.  Studies/Results: Ct Head Wo Contrast  12/24/2014   CLINICAL DATA:  Acute onset of hypoglycemia. Respiratory arrest. Initial encounter.  EXAM: CT HEAD WITHOUT CONTRAST  TECHNIQUE: Contiguous axial images were obtained from the base of the skull through the vertex without intravenous contrast.  COMPARISON:  CT of the head performed 02/07/2012  FINDINGS: There is no evidence of acute infarction, mass lesion, or intra- or extra-axial hemorrhage on CT.  Prominence of the ventricles and sulci suggests mild cortical volume loss. Mild cerebellar atrophy is noted.  The brainstem and fourth ventricle are within normal limits. The basal ganglia are unremarkable in appearance. The  cerebral hemispheres demonstrate grossly normal gray-white differentiation. No mass effect or midline shift is seen.  There is no evidence of fracture; visualized osseous structures are unremarkable in appearance. The orbits are within normal limits. The paranasal sinuses and mastoid air cells are well-aerated. No significant soft tissue abnormalities are seen.  IMPRESSION: 1. No acute intracranial pathology seen on CT. 2. Mild cortical volume loss noted.   Electronically Signed   By: Roanna Raider M.D.   On: 01/10/2015 23:47   Mr Brain Wo Contrast  01/12/2015   CLINICAL DATA:  Initial evaluation for acute altered mental status and setting of hypoglycemia.  EXAM: MRI HEAD WITHOUT CONTRAST  TECHNIQUE: Multiplanar, multiecho pulse sequences of the brain and surrounding structures were obtained without intravenous contrast.  COMPARISON:  Prior CT from 01/02/2015 as well as previous brain MRI from 11/07/2014  FINDINGS: Study is mildly degraded by motion artifact.  Moderate cerebral and cerebellar atrophy again seen. Minimal T2/FLAIR hyperintense foci present within the periventricular white matter, like related to very mild chronic small vessel ischemic changes. A small chronic lacunar infarct involves the left anterior aspect of the corpus callosum. Additional tiny remote lacunar infarcts within the bilateral basal ganglia.  There is abnormal somewhat ill-defined restricted diffusion involving the splenium of the corpus callosum (series 4, image 24). This is located along the midline, extending towards the left. No significant associated T2 or FLAIR signal abnormality within the splenium itself. In addition, there is question of subtle diffusion abnormality within the bilateral occipital regions as well (series 4, image 22). There is question of subtle corresponding T2/FLAIR signal abnormality within this region (series 7, image 9). While these findings are nonspecific, changes such is these can be seen in the  setting of hypoglycemic brain injury. No other abnormal foci of restricted diffusion or evidence of infarct. Normal intravascular flow voids are maintained.  No mass lesion, mass  effect, or midline shift. No hydrocephalus. No extra-axial fluid collection. No acute or chronic hemorrhage.  Craniocervical junction within normal limits. Pituitary gland normal. No acute abnormality about the orbits. Sequelae of prior bilateral lens extraction noted.  Paranasal sinuses are clear. Scattered fluid opacity present within the mastoid air cells bilaterally, left greater than right. Inner ear structures normal.  Bone marrow signal intensity within normal limits. Scalp soft tissues unremarkable.  IMPRESSION: 1. Abnormal restricted diffusion involving the splenium of the corpus callosum, with question of more subtle diffusion and T2/FLAIR abnormality within the bilateral occipital regions. Finding is nonspecific, but can be seen in the setting of hypoglycemic brain injury. Possible cerebral ischemia, posterior reversible encephalopathy syndrome (PRES), or encephalitis, either infectious or inflammatory, could also be considered, although these are felt to be less likely. 2. Moderate cerebral atrophy with a few small chronic lacunar infarcts as above.   Electronically Signed   By: Rise Mu M.D.   On: 01/12/2015 04:10   Dg Chest Port 1 View  01/13/2015   CLINICAL DATA:  Respiratory failure  EXAM: PORTABLE CHEST - 1 VIEW  COMPARISON:  Portable chest x-ray of 01/12/2015  FINDINGS: The tip of the endotracheal tube is approximately 3.0 cm above the carina. There is little change in airspace disease particularly at the lung bases left-greater-than-right consistent with atelectasis or pneumonia. The lungs do appear to be slightly better aerated. Cardiomegaly is stable. NG tube remains.  IMPRESSION: Improved aeration. Persistent bibasilar opacities consistent with atelectasis or pneumonia.   Electronically Signed   By: Dwyane Dee M.D.   On: 01/13/2015 08:02   Dg Chest Port 1 View  01/12/2015   CLINICAL DATA:  Acute onset of shortness of breath. Follow-up aspiration pneumonia. Initial encounter.  EXAM: PORTABLE CHEST - 1 VIEW  COMPARISON:  Chest radiograph performed 12/31/2014  FINDINGS: The patient's endotracheal tube is seen ending 3-4 cm above the carina. An enteric tube is noted extending below the diaphragm.  Patchy bilateral central airspace opacities may reflect pneumonia or pulmonary edema. Aspiration cannot be entirely excluded, given clinical concern. A small left pleural effusion is suspected. No pneumothorax is seen.  The cardiomediastinal silhouette is borderline normal in size. No acute osseous abnormalities are identified.  IMPRESSION: 1. Endotracheal tube seen ending 3-4 cm above the carina. 2. Patchy bilateral central airspace opacities may reflect pneumonia or pulmonary edema, somewhat more prominent than on the prior study. Aspiration cannot be entirely excluded, given clinical concern. Suspect small left pleural effusion.   Electronically Signed   By: Roanna Raider M.D.   On: 01/12/2015 01:40   Dg Chest Portable 1 View  01/19/2015   CLINICAL DATA:  Hypoglycemia.  Found unresponsive.  EXAM: PORTABLE CHEST - 1 VIEW  COMPARISON:  12/02/2014  FINDINGS: Endotracheal tube tip just below the clavicular heads. The orogastric tube enters the stomach at least.  Cardiomegaly and main pulmonary artery prominence, similar to previous when accounting for differences in technique. Coronary artery calcification.  Perihilar lung opacity. No evidence of pleural fluid or air leak. No acute osseous findings.  IMPRESSION: 1. The endotracheal and orogastric tubes are in good position. 2. Perihilar edema or aspiration.   Electronically Signed   By: Marnee Spring M.D.   On: 01/06/2015 21:15    MEDICATIONS  Scheduled: . antiseptic oral rinse  7 mL Mouth Rinse QID  . chlorhexidine  15 mL Mouth Rinse BID  . famotidine  20 mg Per Tube Daily  . feeding supplement (PRO-STAT SUGAR FREE 64)  60 mL Per Tube BID  . feeding supplement (VITAL HIGH PROTEIN)  1,000 mL Per Tube Q24H  . furosemide  40 mg Intravenous Q8H  . heparin subcutaneous  5,000 Units Subcutaneous 3 times per day  . insulin aspart  1-3 Units Subcutaneous 6 times per day  . potassium chloride  40 mEq Per Tube TID    ASSESSMENT/PLAN:                                                                                                            69 y/o altered mental status, likely due to severe hypoglycemic encephalopathy. Some improvement on my exam today. Will continue to follow.  Wyatt Portela, MD [facility] [phone]  01/13/2015, 11:08 AM

## 2015-01-13 NOTE — Progress Notes (Signed)
eLink Physician-Brief Progress Note Patient Name: Krista Wise DOB: 01/22/1946 MRN: [id number]   Date of Service  01/13/2015  HPI/Events of Note  Hypoglycemia on D5W at 50 cc/hr now with blood sugar of 191.  CBGs have been greater than 120 over past several hours.  eICU Interventions  Plan: Change to NS at [facility] Continue to monitor blood sugar     Intervention Category Intermediate Interventions: Other:  DETERDING,ELIZABETH 01/13/2015, 12:32 AM

## 2015-01-13 NOTE — Clinical Documentation Improvement (Signed)
Please specify diagnosis related to below supporting information, if appropriate.   Possible Clinical Conditions?  _______CKD Stage I - GFR > OR = 90 _______CKD Stage II - GFR 60-80 _______CKD Stage III - GFR 30-59 _______CKD Stage IV - GFR 15-29 _______CKD Stage V - GFR < 15 _______ESRD (End Stage Renal Disease) _______Other condition_____________ _______Cannot Clinically determine   Supporting Information:  Per 01/12/15  Progress notes = RENAL :AKI, CKD   P: Monitor BMET intermittently Monitor I/Os Correct electrolytes as indicated   Per 01/02/2015 H&P= Patient has a hx of CKD.  Patient's labs during this admission  Component     Latest Ref Rng 12/30/2014 12/30/2014 01/07/2015 01/04/2015         8:26 PM  8:38 PM  8:43 PM  8:44 PM  EGFR (Non-African Amer.)     >60 mL/min   23 (L)      Component     Latest Ref Rng 01/12/2015 01/12/2015 01/12/2015 01/12/2015         3:52 AM  4:08 AM  5:51 AM  6:51 AM  EGFR (Non-African Amer.)     >60 mL/min 23 (L)      Component     Latest Ref Rng 01/12/2015 01/12/2015 01/12/2015 01/12/2015         7:59 AM  8:10 AM  9:06 AM 10:17 AM  EGFR (Non-African Amer.)     >60 mL/min  23 (L)      Component     Latest Ref Rng 01/13/2015 01/13/2015 01/13/2015 01/13/2015        12:09 AM  2:00 AM  2:16 AM  3:47 AM  EGFR (Non-African Amer.)     >60 mL/min   25 (L)       Thank You, Serena Colonel ,RN Clinical Documentation Specialist:  [place] [employer]

## 2015-01-13 NOTE — Progress Notes (Signed)
Results for ANIKA, HANIGAN (MRN [id number]) as of 01/13/2015 13:54  Ref. Range 01/13/2015 00:09 01/13/2015 02:00 01/13/2015 03:47 01/13/2015 08:19 01/13/2015 12:07  Glucose-Capillary Latest Ref Range: 65-99 mg/dL 086 (H) 578 (H) 469 (H) 263 (H) 310 (H)  Noted that CBGs continue to be elevated.  On continuous tube feedings. Recommend adding low dose basal insulin dose of Lantus 10-15 units daily and add tube feed coverage Novolog 3 units every 4 hours if CBGs continue to be greater than 180 mg/dl. Will continue to follow while in hospital. Smith Mince RN BSN CDE

## 2015-01-13 NOTE — Progress Notes (Signed)
PULMONARY / CRITICAL CARE MEDICINE   Name: Krista Wise MRN: [id number] DOB: 12-24-1945    ADMISSION DATE:  02-02-15 CONSULTATION DATE:  02/02/15  REFERRING MD :  Lynelle Doctor, MD  CHIEF COMPLAINT:  Change in MS  INITIAL PRESENTATION:  5 F admitted 6/19 to ICU/PCCM service with profound hypoglycemia and AMS requiring intubation  MAJOR EVENTS/TEST RESULTS: 6/19 CT head: NAICP 6/20 MRI brain: Abnormal restricted diffusion involving the splenium of the corpus callosum, with question of more subtle diffusion and T2/FLAIR abnormality within the bilateral occipital regions. Finding is nonspecific, but can be seen in the setting of hypoglycemic brain injury. Possible cerebral ischemia, posterior reversible encephalopathy syndrome (PRES), or encephalitis, either infectious or inflammatory, could also be considered, although these are felt to be less likely 6/20 EEG: This is an abnormal electroencephalogram secondary to burst-suppression activity which is can be seen as a medication effect. Triphasic activity is noted as well consistent with an encephalopathy, etiology nonspecific  INDWELLING DEVICES:: ETT 6/19 >>   MICRO DATA: MRSA 6/19 >> NEG Resp 6/20 >>  Blood 6/20 >>   ANTIMICROBIALS:  Zosyn 6/19 >>    SUBJECTIVE:  Comatose  VITAL SIGNS: Temp:  [100 F (37.8 C)-102.2 F (39 C)] 100.2 F (37.9 C) (06/21 0900) Pulse Rate:  [72-91] 84 (06/21 0900) Resp:  [15-25] 19 (06/21 0900) BP: (91-130)/(43-68) 112/55 mmHg (06/21 0900) SpO2:  [97 %-100 %] 97 % (06/21 0900) FiO2 (%):  [40 %] 40 % (06/21 0739) Weight:  [115 kg (253 lb 8.5 oz)] 115 kg (253 lb 8.5 oz) (06/21 0500) HEMODYNAMICS:   VENTILATOR SETTINGS: Vent Mode:  [-] CPAP;PSV FiO2 (%):  [40 %] 40 % Set Rate:  [18 bmp] 18 bmp Vt Set:  [450 mL] 450 mL PEEP:  [5 cmH20] 5 cmH20 Pressure Support:  [5 cmH20-8 cmH20] 5 cmH20 Plateau Pressure:  [18 cmH20] 18 cmH20 INTAKE / OUTPUT:  Intake/Output Summary (Last 24 hours) at  01/13/15 1024 Last data filed at 01/13/15 0900  Gross per 24 hour  Intake 2018.25 ml  Output   1155 ml  Net 863.25 ml    PHYSICAL EXAMINATION: General:  Completely unresponsive.  Neuro: Comatose, +/- decerebrate posturing, incomplete oculocephalic reflexes, not withdrawing to pain. HEENT:NCAT, PERRL (sluggish) and EOM is spontaneous and none purposeful. Cardiovascular: reg, no M Lungs: clear Abdomen: soft, +BS Ext: symmetric edema  LABS:  CBC  Recent Labs Lab February 02, 2015 2043 01/12/15 0810 01/13/15 0216  WBC 8.7 11.6* 10.2  HGB 9.8* 10.0* 9.5*  HCT 32.6* 32.8* 31.2*  PLT 267 264 205   Coag's  Recent Labs Lab 2015/02/02 2043  APTT 31  INR 1.17   BMET  Recent Labs Lab 01/12/15 0352 01/12/15 0810 01/13/15 0216  NA 141 140 136  K 4.5 4.5 3.8  CL 104 105 104  CO2 BUN 43* 41* 50*  CREATININE 2.14* 2.10* 1.98*  GLUCOSE 74 144* 206*   Electrolytes  Recent Labs Lab 01/12/15 0352 01/12/15 0810 01/13/15 0216  CALCIUM 8.4* 8.1* 8.0*  MG  --  1.4*  --   PHOS  --  4.8*  --    Sepsis Markers  Recent Labs Lab 02-02-2015 2053 01/12/15 1017 01/13/15 0216  LATICACIDVEN 0.89  --   --   PROCALCITON  --  <0.10 0.13   ABG  Recent Labs Lab 01/12/15 0408  PHART 7.355  PCO2ART 42.8  PO2ART 91.5   Liver Enzymes  Recent Labs Lab 02/02/15 2043 01/12/15 0352  AST 14*  17  ALT 9* 10*  ALKPHOS 61 65  BILITOT 0.5 0.5  ALBUMIN 2.3* 2.7*   Cardiac Enzymes  Recent Labs Lab 01/12/15 0352 01/12/15 0810  TROPONINI 0.11* 0.10*   Glucose  Recent Labs Lab 01/12/15 1528 01/12/15 1958 01/13/15 0009 01/13/15 0200 01/13/15 0347 01/13/15 0819  GLUCAP 130* 141* 192* 193* 217* 263*   CXR: I reviewed myself, edema pattern, ETT ok.  ASSESSMENT / PLAN:  PULMONARY A:  Acute resp failure due to AMS OHS/OSA Pulm edema, mild P:   PS but no extubation given mental status. Discussed trach/peg option with husband and advised he speaks with the  rest of his family. Cont vent bundle. Follow CXR and ABG.  CARDIOVASCULAR A:  Hypertension this AM. P:  Monitor for now.  RENAL A:   AKI CKD P:   Monitor BMET intermittently Monitor I/Os Correct electrolytes as indicated Low dose lasix today.  GASTROINTESTINAL A:   No issues P:   SUP: enteral famotidine. Continue TF, increase to goal.  HEMATOLOGIC A:   No issues P:  DVT px: SQ heparin. Monitor CBC intermittently. Transfuse per usual ICU guidelines.  INFECTIOUS A:   Doubt PNA PCT in 6/21 0.13, afebrile and WBC is normal. P:   Monitor temp, WBC count Micro and abx as above D/C zosyn.  ENDOCRINE A:   DM 2 Hypoglycemia, resolved P:   Holding home meds. Cont to monitor CBG. Sensitive ISS ordered.  NEUROLOGIC A: Severe hypoglycemic brain injury Coma P:   RASS goal: 0 Neurology following  FAMILY updates: Spoke with husband bedside, began conversation regarding trach/peg incase patient does not recover.  Will continue support for now.  The patient is critically ill with multiple organ systems failure and requires high complexity decision making for assessment and support, frequent evaluation and titration of therapies, application of advanced monitoring technologies and extensive interpretation of multiple databases.   Critical Care Time devoted to patient care services described in this note is  35  Minutes. This time reflects time of care of this signee Dr Koren Bound. This critical care time does not reflect procedure time, or teaching time or supervisory time of PA/NP/Med student/Med Resident etc but could involve care discussion time.  Alyson Reedy, M.D. [facility] Pulmonary/Critical Care Medicine. Pager: [phone]. After hours pager: [phone].  01/13/2015, 10:24 AM

## 2015-01-13 NOTE — Progress Notes (Signed)
0010 called Elink to notify MD CBG 191 and patient receiving D5W at 50cc/hr. Orders given to change fluids to NS at [facility] and to continue to monitor CBG.

## 2015-01-14 ENCOUNTER — Other Ambulatory Visit (HOSPITAL_COMMUNITY): Payer: Medicare Other

## 2015-01-14 ENCOUNTER — Inpatient Hospital Stay (HOSPITAL_COMMUNITY): Payer: Medicare Other

## 2015-01-14 LAB — BASIC METABOLIC PANEL
Anion gap: 11 (ref 5–15)
BUN: 56 mg/dL — ABNORMAL HIGH (ref 6–20)
CALCIUM: 8.6 mg/dL — AB (ref 8.9–10.3)
CO2: 24 mmol/L (ref 22–32)
Chloride: 106 mmol/L (ref 101–111)
Creatinine, Ser: 1.78 mg/dL — ABNORMAL HIGH (ref 0.44–1.00)
GFR calc non Af Amer: 28 mL/min — ABNORMAL LOW (ref >60)
GFR, EST AFRICAN AMERICAN: 33 mL/min — AB (ref >60)
Glucose, Bld: 163 mg/dL — ABNORMAL HIGH (ref 65–99)
POTASSIUM: 4 mmol/L (ref 3.5–5.1)
SODIUM: 141 mmol/L (ref 135–145)

## 2015-01-14 LAB — GLUCOSE, CAPILLARY
GLUCOSE-CAPILLARY: 100 mg/dL — AB (ref 65–99)
GLUCOSE-CAPILLARY: 117 mg/dL — AB (ref 65–99)
GLUCOSE-CAPILLARY: 131 mg/dL — AB (ref 65–99)
GLUCOSE-CAPILLARY: 151 mg/dL — AB (ref 65–99)
GLUCOSE-CAPILLARY: 171 mg/dL — AB (ref 65–99)
GLUCOSE-CAPILLARY: 185 mg/dL — AB (ref 65–99)
GLUCOSE-CAPILLARY: 241 mg/dL — AB (ref 65–99)
Glucose-Capillary: 108 mg/dL — ABNORMAL HIGH (ref 65–99)
Glucose-Capillary: 109 mg/dL — ABNORMAL HIGH (ref 65–99)
Glucose-Capillary: 118 mg/dL — ABNORMAL HIGH (ref 65–99)
Glucose-Capillary: 146 mg/dL — ABNORMAL HIGH (ref 65–99)
Glucose-Capillary: 148 mg/dL — ABNORMAL HIGH (ref 65–99)
Glucose-Capillary: 155 mg/dL — ABNORMAL HIGH (ref 65–99)
Glucose-Capillary: 160 mg/dL — ABNORMAL HIGH (ref 65–99)
Glucose-Capillary: 269 mg/dL — ABNORMAL HIGH (ref 65–99)
Glucose-Capillary: 311 mg/dL — ABNORMAL HIGH (ref 65–99)
Glucose-Capillary: 320 mg/dL — ABNORMAL HIGH (ref 65–99)

## 2015-01-14 LAB — CBC
HCT: 29.9 % — ABNORMAL LOW (ref 36.0–46.0)
HEMOGLOBIN: 9.1 g/dL — AB (ref 12.0–15.0)
MCH: 25.9 pg — AB (ref 26.0–34.0)
MCHC: 30.4 g/dL (ref 30.0–36.0)
MCV: 85.2 fL (ref 78.0–100.0)
Platelets: 212 10*3/uL (ref 150–400)
RBC: 3.51 MIL/uL — ABNORMAL LOW (ref 3.87–5.11)
RDW: 17.3 % — AB (ref 11.5–15.5)
WBC: 10.7 10*3/uL — ABNORMAL HIGH (ref 4.0–10.5)

## 2015-01-14 LAB — PHOSPHORUS: Phosphorus: 4.1 mg/dL (ref 2.5–4.6)

## 2015-01-14 LAB — BLOOD GAS, ARTERIAL
ACID-BASE DEFICIT: 0.7 mmol/L (ref 0.0–2.0)
BICARBONATE: 23.7 meq/L (ref 20.0–24.0)
Drawn by: 441371
FIO2: 0.4 %
MODE: POSITIVE
O2 SAT: 98.7 %
PATIENT TEMPERATURE: 98.6
PEEP: 5 cmH2O
Pressure support: 5 cmH2O
TCO2: 24.9 mmol/L (ref 0–100)
pCO2 arterial: 40.3 mmHg (ref 35.0–45.0)
pH, Arterial: 7.387 (ref 7.350–7.450)
pO2, Arterial: 119 mmHg — ABNORMAL HIGH (ref 80.0–100.0)

## 2015-01-14 LAB — PROCALCITONIN: Procalcitonin: 0.45 ng/mL

## 2015-01-14 LAB — MAGNESIUM: Magnesium: 1.5 mg/dL — ABNORMAL LOW (ref 1.7–2.4)

## 2015-01-14 MED ORDER — VANCOMYCIN HCL 10 G IV SOLR
1250.0000 mg | INTRAVENOUS | Status: DC
  Filled 2015-01-14: qty 1250

## 2015-01-14 MED ORDER — INSULIN GLARGINE 100 UNIT/ML ~~LOC~~ SOLN
10.0000 [IU] | Freq: Every day | SUBCUTANEOUS | Status: DC
  Administered 2015-01-14 – 2015-01-16 (×3): 10 [IU] via SUBCUTANEOUS
  Filled 2015-01-14 (×4): qty 0.1

## 2015-01-14 MED ORDER — INSULIN ASPART 100 UNIT/ML ~~LOC~~ SOLN
1.0000 [IU] | SUBCUTANEOUS | Status: DC
  Administered 2015-01-14 – 2015-01-17 (×20): 1 [IU] via SUBCUTANEOUS

## 2015-01-14 MED ORDER — PNEUMOCOCCAL VAC POLYVALENT 25 MCG/0.5ML IJ INJ
0.5000 mL | INJECTION | INTRAMUSCULAR | Status: DC
  Filled 2015-01-14: qty 0.5

## 2015-01-14 MED ORDER — DEXTROSE 10 % IV SOLN: INTRAVENOUS | Status: DC | PRN

## 2015-01-14 MED ORDER — INSULIN ASPART 100 UNIT/ML ~~LOC~~ SOLN
1.0000 [IU] | SUBCUTANEOUS | Status: DC
  Administered 2015-01-14 (×4): 2 [IU] via SUBCUTANEOUS
  Administered 2015-01-15: 1 [IU] via SUBCUTANEOUS
  Administered 2015-01-15: 3 [IU] via SUBCUTANEOUS
  Administered 2015-01-15: 2 [IU] via SUBCUTANEOUS
  Administered 2015-01-15 (×2): 3 [IU] via SUBCUTANEOUS
  Administered 2015-01-15 (×2): 2 [IU] via SUBCUTANEOUS
  Administered 2015-01-16 (×3): 3 [IU] via SUBCUTANEOUS
  Administered 2015-01-16: 2 [IU] via SUBCUTANEOUS
  Administered 2015-01-16: 3 [IU] via SUBCUTANEOUS
  Administered 2015-01-16 – 2015-01-17 (×2): 2 [IU] via SUBCUTANEOUS
  Administered 2015-01-17: 3 [IU] via SUBCUTANEOUS

## 2015-01-14 MED ORDER — VANCOMYCIN HCL 10 G IV SOLR
2000.0000 mg | Freq: Once | INTRAVENOUS | Status: AC
  Administered 2015-01-14: 2000 mg via INTRAVENOUS
  Filled 2015-01-14: qty 2000

## 2015-01-14 MED ORDER — FUROSEMIDE 10 MG/ML IJ SOLN
40.0000 mg | Freq: Four times a day (QID) | INTRAMUSCULAR | Status: AC
  Administered 2015-01-14 (×3): 40 mg via INTRAVENOUS
  Filled 2015-01-14 (×3): qty 4

## 2015-01-14 NOTE — Progress Notes (Signed)
ANTIBIOTIC CONSULT NOTE - INITIAL  Pharmacy Consult for Vancomycin  Indication: rule out pneumonia  No Known Allergies  Patient Measurements: Height:  (162.6 cm) Weight: 253 lb 8.5 oz (115 kg) IBW/kg (Calculated) : 54.7  Vital Signs: Temp: 101.1 F (38.4 C) (06/22 1900) Temp Source: Core (Comment) (06/22 0800) BP: 119/60 mmHg (06/22 1900) Pulse Rate: 80 (06/22 1900) Intake/Output from previous day: 06/21 0701 - 06/22 0700 In: 1285.9 [I.V.:270.9; NG/GT:1015] Out: 2295 [Urine:2295] Intake/Output from this shift:    Labs:  Recent Labs  01/12/15 0810 01/13/15 0216 01/14/15 0925  WBC 11.6* 10.2 10.7*  HGB 10.0* 9.5* 9.1*  PLT 264 205 212  CREATININE 2.10* 1.98* 1.78*   Estimated Creatinine Clearance: 38.2 mL/min (by C-G formula based on Cr of 1.78). No results for input(s): VANCOTROUGH, VANCOPEAK, VANCORANDOM, GENTTROUGH, GENTPEAK, GENTRANDOM, TOBRATROUGH, TOBRAPEAK, TOBRARND, AMIKACINPEAK, AMIKACINTROU, AMIKACIN in the last 72 hours.   Microbiology: Recent Results (from the past 720 hour(s))  MRSA PCR Screening     Status: None   Collection Time: 01/12/15  1:23 AM  Result Value Ref Range Status   MRSA by PCR NEGATIVE NEGATIVE Final    Comment:        The GeneXpert MRSA Assay (FDA approved for NASAL specimens only), is one component of a comprehensive MRSA colonization surveillance program. It is not intended to diagnose MRSA infection nor to guide or monitor treatment for MRSA infections.   Culture, blood (routine x 2)     Status: None (Preliminary result)   Collection Time: 01/12/15  3:52 AM  Result Value Ref Range Status   Specimen Description BLOOD RIGHT HAND  Final   Special Requests BOTTLES DRAWN AEROBIC ONLY 3CC  Final   Culture NO GROWTH 2 DAYS  Final   Report Status PENDING  Incomplete  Culture, blood (routine x 2)     Status: None (Preliminary result)   Collection Time: 01/12/15  4:02 AM  Result Value Ref Range Status   Specimen Description  BLOOD LEFT ARM  Final   Special Requests BOTTLES DRAWN AEROBIC ONLY 5CC  Final   Culture NO GROWTH 2 DAYS  Final   Report Status PENDING  Incomplete  Culture, respiratory (NON-Expectorated)     Status: None (Preliminary result)   Collection Time: 01/12/15 12:30 PM  Result Value Ref Range Status   Specimen Description TRACHEAL ASPIRATE  Final   Special Requests NONE  Final   Gram Stain   Final    ABUNDANT WBC PRESENT,BOTH PMN AND MONONUCLEAR NO SQUAMOUS EPITHELIAL CELLS SEEN MODERATE GRAM POSITIVE COCCI IN PAIRS MODERATE YEAST Performed at [facility]    Culture   Final    MODERATE STAPHYLOCOCCUS SPECIES Performed at [facility]    Report Status PENDING  Incomplete    Medical History: Past Medical History  Diagnosis Date  . Hypertension   . Depression   . Diabetes mellitus   . GERD (gastroesophageal reflux disease)   . Obesity   . Coronary artery disease 08/2006    Unstable angina. PCI to distal codominant CFX with 2.5 x 20 Taxus DES...  . CHF (congestive heart failure)   . Sleep apnea     stopped cpap  . Arthritis   . Headache(784.0)   . Hx of echocardiogram     Echo (11/15):  Mild LVH, EF 55-60%, no RWMA, Gr 1 DD, mild AI, mild MR, mod LAE.     Medications:  Scheduled:  . antiseptic oral rinse  7 mL Mouth Rinse  QID  . chlorhexidine  15 mL Mouth Rinse BID  . famotidine  20 mg Per Tube Daily  . feeding supplement (PRO-STAT SUGAR FREE 64)  60 mL Per Tube BID  . feeding supplement (VITAL HIGH PROTEIN)  1,000 mL Per Tube Q24H  . furosemide  40 mg Intravenous Q6H  . heparin subcutaneous  5,000 Units Subcutaneous 3 times per day  . insulin aspart  1 Units Subcutaneous 6 times per day  . insulin aspart  1-3 Units Subcutaneous 6 times per day  . insulin glargine  10 Units Subcutaneous Daily  . [START ON 01/15/2015] pneumococcal 23 valent vaccine  0.5 mL Intramuscular Tomorrow-1000   Assessment: 69 year old female admittion on 6/19 s/p intubation  secondary to hypoglycemia and AMS. Patient still having AMS and has been unable to be weaned off the ventilator.  Had been previously on Zosyn for possible PNA, but was d/c earlier today and pt was to be monitored off abx. Now spiked a fever to 101.73F. Growing moderate staph species in tracheal culture.   Patient's CrCl is 38 mL/min, but she has had good UOP  Goal of Therapy:  Vancomycin trough level 15-20 mcg/ml  Plan:  -Vancomycin loading dose of 2000 mg x 1 -Vancomycin 1250 mg q24h -F/U clinical status, c/s, LOT

## 2015-01-14 NOTE — Progress Notes (Signed)
Patient was bagged to MRI. When patient got to MRI, patient was placed on PRVC 450, 18, 100%, 5. Vitals stable throughout procedure. Patient tolerated well. Patient bagged back to ICU with no complications. Patient placed back on vent PS 5 CPAP 5, 40%. RT will monitor.

## 2015-01-14 NOTE — Progress Notes (Signed)
eLink Physician-Brief Progress Note Patient Name: Krista Wise DOB: [birth year] MRN: [id number]   Date of Service  01/14/2015  HPI/Events of Note  Fever Staph in resp culture WBC climbing  eICU Interventions  vanc per pharm      Intervention Category Intermediate Interventions: Infection - evaluation and management  MCQUAID, DOUGLAS 01/14/2015, 7:40 PM

## 2015-01-14 NOTE — Progress Notes (Signed)
Subjective: Patient continues to be non-responsive and now seems to have left arm extensor posturing with sternal rub. Intubated but breathing over the vent.   Objective: Current vital signs: BP 128/61 mmHg  Pulse 86  Temp(Src) 100 F (37.8 C) (Core (Comment))  Resp 22  Ht  (1.626 m)  Wt 115 kg (253 lb 8.5 oz)  BMI 43.50 kg/m2  SpO2 99% Vital signs in last 24 hours: Temp:  [99.9 F (37.7 C)-101.3 F (38.5 C)] 100 F (37.8 C) (06/22 0800) Pulse Rate:  [73-99] 86 (06/22 0800) Resp:  [17-28] 22 (06/22 0800) BP: (106-130)/(47-75) 128/61 mmHg (06/22 0800) SpO2:  [96 %-100 %] 99 % (06/22 0800) FiO2 (%):  [40 %] 40 % (06/22 0800)  Intake/Output from previous day: 06/21 0701 - 06/22 0700 In: 1285.9 [I.V.:270.9; NG/GT:1015] Out: 2295 [Urine:2295] Intake/Output this shift: Total I/O In: 45 [I.V.:10; NG/GT:35] Out: 140 [Urine:140] Nutritional status: Diet NPO time specified  Neurologic Exam: Mental Status: Patient does not respond to verbal stimuli.  Extensor postures with left arm to deep sternal rub.  Does not follow commands.  Intubated.  Cranial Nerves: II: patient does not respond confrontation bilaterally, pupils right 2 mm, left 2 mm,and reactive bilaterally III,IV,VI: doll's response absent bilaterally. Doll's response intact. V,VII: corneal reflex present bilaterally  VIII: patient does not respond to verbal stimuli IX,X: gag reflex present, XI: trapezius strength unable to test bilaterally XII: tongue strength unable to test Motor: Extremities flaccid throughout.  Left extensor posturing with sternal rub Sensory: Only responds to sternal rub Deep Tendon Reflexes:  Absent throughout. Plantars: downgoing bilaterally     Lab Results: Basic Metabolic Panel:  Recent Labs Lab 12/26/2014 2043 01/12/15 0352 01/12/15 0810 01/13/15 0216  NA 140 141 140 136  K 4.4 4.5 4.5 3.8  CL 103 104 105 104  CO2 GLUCOSE 156* 74 144* 206*  BUN 43* 43* 41*  50*  CREATININE 2.13* 2.14* 2.10* 1.98*  CALCIUM 8.1* 8.4* 8.1* 8.0*  MG  --   --  1.4*  --   PHOS  --   --  4.8*  --     Liver Function Tests:  Recent Labs Lab 01/12/2015 2043 01/12/15 0352  AST 14* 17  ALT 9* 10*  ALKPHOS 61 65  BILITOT 0.5 0.5  PROT 5.5* 6.6  ALBUMIN 2.3* 2.7*    Recent Labs Lab 01/12/15 0352  LIPASE 11*  AMYLASE 23*    Recent Labs Lab 12/28/2014 2044  AMMONIA 55*    CBC:  Recent Labs Lab 01/06/2015 2043 01/12/15 0810 01/13/15 0216  WBC 8.7 11.6* 10.2  NEUTROABS 7.3  --   --   HGB 9.8* 10.0* 9.5*  HCT 32.6* 32.8* 31.2*  MCV 86.7 85.6 85.5  PLT 267 264 205    Cardiac Enzymes:  Recent Labs Lab 01/12/15 0352 01/12/15 0810  TROPONINI 0.11* 0.10*    Lipid Panel: No results for input(s): CHOL, TRIG, HDL, CHOLHDL, VLDL, LDLCALC in the last 168 hours.  CBG:  Recent Labs Lab 01/14/15 0242 01/14/15 0344 01/14/15 0459 01/14/15 0602 01/14/15 0831  GLUCAP 100* 108* 118* 117* 155*    Microbiology: Results for orders placed or performed during the hospital encounter of 01/10/2015  MRSA PCR Screening     Status: None   Collection Time: 01/12/15  1:23 AM  Result Value Ref Range Status   MRSA by PCR NEGATIVE NEGATIVE Final    Comment:        The GeneXpert  MRSA Assay (FDA approved for NASAL specimens only), is one component of a comprehensive MRSA colonization surveillance program. It is not intended to diagnose MRSA infection nor to guide or monitor treatment for MRSA infections.   Culture, blood (routine x 2)     Status: None (Preliminary result)   Collection Time: 01/12/15  3:52 AM  Result Value Ref Range Status   Specimen Description BLOOD RIGHT HAND  Final   Special Requests BOTTLES DRAWN AEROBIC ONLY 3CC  Final   Culture NO GROWTH 1 DAY  Final   Report Status PENDING  Incomplete  Culture, blood (routine x 2)     Status: None (Preliminary result)   Collection Time: 01/12/15  4:02 AM  Result Value Ref Range Status    Specimen Description BLOOD LEFT ARM  Final   Special Requests BOTTLES DRAWN AEROBIC ONLY 5CC  Final   Culture NO GROWTH 1 DAY  Final   Report Status PENDING  Incomplete  Culture, respiratory (NON-Expectorated)     Status: None (Preliminary result)   Collection Time: 01/12/15 12:30 PM  Result Value Ref Range Status   Specimen Description TRACHEAL ASPIRATE  Final   Special Requests NONE  Final   Gram Stain   Final    ABUNDANT WBC PRESENT,BOTH PMN AND MONONUCLEAR NO SQUAMOUS EPITHELIAL CELLS SEEN MODERATE GRAM POSITIVE COCCI IN PAIRS MODERATE YEAST Performed at [facility]    Culture   Final    Culture reincubated for better growth Performed at [facility]    Report Status PENDING  Incomplete    Coagulation Studies:  Recent Labs  01-13-2015 2043  LABPROT 15.1  INR 1.17    Imaging: Dg Chest Port 1 View  01/13/2015   CLINICAL DATA:  Respiratory failure  EXAM: PORTABLE CHEST - 1 VIEW  COMPARISON:  Portable chest x-ray of 01/12/2015  FINDINGS: The tip of the endotracheal tube is approximately 3.0 cm above the carina. There is little change in airspace disease particularly at the lung bases left-greater-than-right consistent with atelectasis or pneumonia. The lungs do appear to be slightly better aerated. Cardiomegaly is stable. NG tube remains.  IMPRESSION: Improved aeration. Persistent bibasilar opacities consistent with atelectasis or pneumonia.   Electronically Signed   By: Dwyane Dee M.D.   On: 01/13/2015 08:02    Medications:  Scheduled: . antiseptic oral rinse  7 mL Mouth Rinse QID  . chlorhexidine  15 mL Mouth Rinse BID  . famotidine  20 mg Per Tube Daily  . feeding supplement (PRO-STAT SUGAR FREE 64)  60 mL Per Tube BID  . feeding supplement (VITAL HIGH PROTEIN)  1,000 mL Per Tube Q24H  . furosemide  40 mg Intravenous Q6H  . heparin subcutaneous  5,000 Units Subcutaneous 3 times per day  . insulin aspart  1 Units Subcutaneous 6 times per day  . insulin  aspart  1-3 Units Subcutaneous 6 times per day  . insulin glargine  10 Units Subcutaneous Daily  . [START ON 01/15/2015] pneumococcal 23 valent vaccine  0.5 mL Intramuscular Tomorrow-1000    Assessment/Plan:  69 YO female with AMS which was likely due to severe hypoglycemic encephalopathy.  Today exam is noted for left arm extensor posturing with sternal rub.  There is some mention this was present prior on Saturday but on exam this was not present yesterday.  Will order MRI brain to evaluate for any changes. Agree with continued treatment of underlying metabolic issues.    Felicie Morn PA-C [facility] [phone]  01/14/2015, 9:17 AM Patient seen and examined together with physician assistant and I concur with the assessment and plan.  Wyatt Portela, MD

## 2015-01-14 NOTE — Progress Notes (Signed)
EEG completed, results pending. 

## 2015-01-14 NOTE — Progress Notes (Signed)
PULMONARY / CRITICAL CARE MEDICINE   Name: Krista Wise MRN: [id number] DOB: [birth year]    ADMISSION DATE:  2015-01-27 CONSULTATION DATE:  27-Jan-2015  REFERRING MD :  Lynelle Doctor, MD  CHIEF COMPLAINT:  Change in MS  INITIAL PRESENTATION:  89 F admitted 6/19 to ICU/PCCM service with profound hypoglycemia and AMS requiring intubation  MAJOR EVENTS/TEST RESULTS: 6/19 CT head: NAICP 6/20 MRI brain: Abnormal restricted diffusion involving the splenium of the corpus callosum, with question of more subtle diffusion and T2/FLAIR abnormality within the bilateral occipital regions. Finding is nonspecific, but can be seen in the setting of hypoglycemic brain injury. Possible cerebral ischemia, posterior reversible encephalopathy syndrome (PRES), or encephalitis, either infectious or inflammatory, could also be considered, although these are felt to be less likely 6/20 EEG: This is an abnormal electroencephalogram secondary to burst-suppression activity which is can be seen as a medication effect. Triphasic activity is noted as well consistent with an encephalopathy, etiology nonspecific  INDWELLING DEVICES:: ETT 6/19 >>   MICRO DATA: MRSA 6/19 >> NEG Resp 6/20 >>  Blood 6/20 >>   ANTIMICROBIALS:  Zosyn 6/19 >>    SUBJECTIVE:  Comatose  VITAL SIGNS: Temp:  [99.9 F (37.7 C)-101.3 F (38.5 C)] 100 F (37.8 C) (06/22 0800) Pulse Rate:  [73-99] 86 (06/22 0800) Resp:  [17-28] 22 (06/22 0800) BP: (106-130)/(47-75) 128/61 mmHg (06/22 0800) SpO2:  [96 %-100 %] 99 % (06/22 0800) FiO2 (%):  [40 %] 40 % (06/22 0800) HEMODYNAMICS:   VENTILATOR SETTINGS: Vent Mode:  [-] PRVC FiO2 (%):  [40 %] 40 % Set Rate:  [18 bmp] 18 bmp Vt Set:  [450 mL] 450 mL PEEP:  [5 cmH20] 5 cmH20 Pressure Support:  [5 cmH20-10 cmH20] 5 cmH20 INTAKE / OUTPUT:  Intake/Output Summary (Last 24 hours) at 01/14/15 0848 Last data filed at 01/14/15 0800  Gross per 24 hour  Intake 1285.86 ml  Output   2310 ml  Net  -1024.14 ml    PHYSICAL EXAMINATION: General:  Completely unresponsive.  Neuro: Comatose, +/- decerebrate posturing, incomplete oculocephalic reflexes, not withdrawing to pain. HEENT:NCAT, PERRL (sluggish) and EOM is spontaneous and none purposeful. Cardiovascular: reg, no M Lungs: clear Abdomen: soft, +BS Ext: symmetric edema  LABS:  CBC  Recent Labs Lab January 27, 2015 2043 01/12/15 0810 01/13/15 0216  WBC 8.7 11.6* 10.2  HGB 9.8* 10.0* 9.5*  HCT 32.6* 32.8* 31.2*  PLT 267 264 205   Coag's  Recent Labs Lab 01-27-2015 2043  APTT 31  INR 1.17   BMET  Recent Labs Lab 01/12/15 0352 01/12/15 0810 01/13/15 0216  NA 141 140 136  K 4.5 4.5 3.8  CL 104 105 104  CO2 24 23 23   BUN 43* 41* 50*  CREATININE 2.14* 2.10* 1.98*  GLUCOSE 74 144* 206*   Electrolytes  Recent Labs Lab 01/12/15 0352 01/12/15 0810 01/13/15 0216  CALCIUM 8.4* 8.1* 8.0*  MG  --  1.4*  --   PHOS  --  4.8*  --    Sepsis Markers  Recent Labs Lab 01-27-15 2053 01/12/15 1017 01/13/15 0216 01/14/15 0500  LATICACIDVEN 0.89  --   --   --   PROCALCITON  --  <0.10 0.13 0.45   ABG  Recent Labs Lab 01/12/15 0408  PHART 7.355  PCO2ART 42.8  PO2ART 91.5   Liver Enzymes  Recent Labs Lab 27-Jan-2015 2043 01/12/15 0352  AST 14* 17  ALT 9* 10*  ALKPHOS 61 65  BILITOT 0.5 0.5  ALBUMIN 2.3* 2.7*  Cardiac Enzymes  Recent Labs Lab 01/12/15 0352 01/12/15 0810  TROPONINI 0.11* 0.10*   Glucose  Recent Labs Lab 01/14/15 0140 01/14/15 0242 01/14/15 0344 01/14/15 0459 01/14/15 0602 01/14/15 0831  GLUCAP 109* 100* 108* 118* 117* 155*   CXR: I reviewed myself, edema pattern, ETT ok.  ASSESSMENT / PLAN:  PULMONARY A: ETT 6/19>>> Acute resp failure due to AMS OHS/OSA Pulm edema, mild P:   PS but no extubation given mental status. Discussed trach/peg option with husband and advised he speaks with the rest of his family on 6/21 but have not discussed that yet. Cont vent  bundle. Follow CXR and ABG.  CARDIOVASCULAR A:  Hypertension this AM. P:  Monitor for now.  RENAL A:   AKI CKD P:   Monitor BMET intermittently Monitor I/Os Correct electrolytes as indicated Lasix 40 mg IV q6 x3 doses.  GASTROINTESTINAL A:   No issues P:   SUP: enteral famotidine. Continue TF, increase to goal.  HEMATOLOGIC A:   No issues P:  DVT px: SQ heparin. Monitor CBC intermittently. Transfuse per usual ICU guidelines.  INFECTIOUS A:   Doubt PNA PCT in 6/21 0.13, afebrile and WBC is normal. P:   Monitor temp, WBC count Micro and abx as above D/Ced zosyn, monitor off abx.  ENDOCRINE A:   DM 2 Hypoglycemia, resolved P:   Holding home meds. Cont to monitor CBG. Sensitive ISS ordered.  NEUROLOGIC A: Severe hypoglycemic brain injury Coma EEG with burst suppression. P:   RASS goal: 0 Neurology following Neuro to determine need for another MRI.  FAMILY updates: Spoke with husband bedside, he remains very hopeful, almost unrealistically so.  Will continue discussion.  The patient is critically ill with multiple organ systems failure and requires high complexity decision making for assessment and support, frequent evaluation and titration of therapies, application of advanced monitoring technologies and extensive interpretation of multiple databases.   Critical Care Time devoted to patient care services described in this note is  35  Minutes. This time reflects time of care of this signee Dr Koren Bound. This critical care time does not reflect procedure time, or teaching time or supervisory time of PA/NP/Med student/Med Resident etc but could involve care discussion time.  Alyson Reedy, M.D. [facility] Pulmonary/Critical Care Medicine. Pager: [phone]. After hours pager: [phone].  01/14/2015, 8:48 AM

## 2015-01-14 NOTE — Procedures (Signed)
ELECTROENCEPHALOGRAM REPORT   Patient: Krista Wise       Room #: [id number] EEG No. ID: [id number] Age: 69 y.o.        Sex: female Referring Physician: Tyson Alias Report Date:  01/14/2015        Interpreting Physician: Thana Farr  History: [name] is an 69 y.o. female with altered mental status  Medications:  Scheduled: . antiseptic oral rinse  7 mL Mouth Rinse QID  . chlorhexidine  15 mL Mouth Rinse BID  . famotidine  20 mg Per Tube Daily  . feeding supplement (PRO-STAT SUGAR FREE 64)  60 mL Per Tube BID  . feeding supplement (VITAL HIGH PROTEIN)  1,000 mL Per Tube Q24H  . furosemide  40 mg Intravenous Q6H  . heparin subcutaneous  5,000 Units Subcutaneous 3 times per day  . insulin aspart  1 Units Subcutaneous 6 times per day  . insulin aspart  1-3 Units Subcutaneous 6 times per day  . insulin glargine  10 Units Subcutaneous Daily  . [START ON 01/15/2015] pneumococcal 23 valent vaccine  0.5 mL Intramuscular Tomorrow-1000    Conditions of Recording:  This is a 16 channel EEG carried out with the patient in the intubated and sedated state.  Description: The background activity is dominated by a low voltage polymorphic delta activity that is diffusely distributed. At times some intermixed theta activity is noted as well that is also poorly organized. Intermittently is noted periods of attenuation lasting 1-2 seconds that are generalized. These are frequently seen during the recording.  Also noted frequently are intermittent discharges of triphasic morphology.  These are more common than on the recording of 6/20.   Hyperventilation and intermittent photic stimulation were not performed.   IMPRESSION: This is an abnormal electroencephalogram secondary to general background slowing and frequent intermittent triphasic waves  This finding is most consistent with an encephalopathy that is etiologically nonspecific, but often associated with hepatic disease.     Thana Farr, MD [facility] [phone] 01/14/2015, 6:32 PM

## 2015-01-15 ENCOUNTER — Inpatient Hospital Stay (HOSPITAL_COMMUNITY): Payer: Medicare Other

## 2015-01-15 DIAGNOSIS — J9600 Acute respiratory failure, unspecified whether with hypoxia or hypercapnia: Secondary | ICD-10-CM

## 2015-01-15 LAB — CBC
HCT: 30.8 % — ABNORMAL LOW (ref 36.0–46.0)
Hemoglobin: 9.2 g/dL — ABNORMAL LOW (ref 12.0–15.0)
MCH: 25.8 pg — ABNORMAL LOW (ref 26.0–34.0)
MCHC: 29.9 g/dL — ABNORMAL LOW (ref 30.0–36.0)
MCV: 86.5 fL (ref 78.0–100.0)
Platelets: 211 10*3/uL (ref 150–400)
RBC: 3.56 MIL/uL — AB (ref 3.87–5.11)
RDW: 17.2 % — ABNORMAL HIGH (ref 11.5–15.5)
WBC: 9.1 10*3/uL (ref 4.0–10.5)

## 2015-01-15 LAB — BASIC METABOLIC PANEL
ANION GAP: 11 (ref 5–15)
BUN: 62 mg/dL — ABNORMAL HIGH (ref 6–20)
CHLORIDE: 107 mmol/L (ref 101–111)
CO2: 24 mmol/L (ref 22–32)
CREATININE: 1.67 mg/dL — AB (ref 0.44–1.00)
Calcium: 8.7 mg/dL — ABNORMAL LOW (ref 8.9–10.3)
GFR calc Af Amer: 36 mL/min — ABNORMAL LOW (ref >60)
GFR calc non Af Amer: 31 mL/min — ABNORMAL LOW (ref >60)
GLUCOSE: 147 mg/dL — AB (ref 65–99)
Potassium: 3.6 mmol/L (ref 3.5–5.1)
Sodium: 142 mmol/L (ref 135–145)

## 2015-01-15 LAB — GLUCOSE, CAPILLARY
GLUCOSE-CAPILLARY: 149 mg/dL — AB (ref 65–99)
GLUCOSE-CAPILLARY: 163 mg/dL — AB (ref 65–99)
GLUCOSE-CAPILLARY: 184 mg/dL — AB (ref 65–99)
GLUCOSE-CAPILLARY: 218 mg/dL — AB (ref 65–99)
Glucose-Capillary: 207 mg/dL — ABNORMAL HIGH (ref 65–99)
Glucose-Capillary: 186 mg/dL — ABNORMAL HIGH (ref 65–99)
Glucose-Capillary: 228 mg/dL — ABNORMAL HIGH (ref 65–99)

## 2015-01-15 LAB — MAGNESIUM: MAGNESIUM: 1.5 mg/dL — AB (ref 1.7–2.4)

## 2015-01-15 LAB — CULTURE, RESPIRATORY W GRAM STAIN

## 2015-01-15 LAB — CULTURE, RESPIRATORY

## 2015-01-15 LAB — PHOSPHORUS: Phosphorus: 3.9 mg/dL (ref 2.5–4.6)

## 2015-01-15 MED ORDER — FENTANYL CITRATE (PF) 100 MCG/2ML IJ SOLN
50.0000 ug | INTRAMUSCULAR | Status: AC | PRN
  Administered 2015-01-15 – 2015-01-16 (×3): 50 ug via INTRAVENOUS
  Filled 2015-01-15 (×2): qty 2

## 2015-01-15 MED ORDER — FENTANYL CITRATE (PF) 100 MCG/2ML IJ SOLN
50.0000 ug | INTRAMUSCULAR | Status: DC | PRN
  Administered 2015-01-16 – 2015-01-17 (×4): 50 ug via INTRAVENOUS
  Filled 2015-01-15 (×4): qty 2

## 2015-01-15 MED ORDER — CEFAZOLIN SODIUM-DEXTROSE 2-3 GM-% IV SOLR
2.0000 g | Freq: Three times a day (TID) | INTRAVENOUS | Status: DC
  Administered 2015-01-15 – 2015-01-17 (×5): 2 g via INTRAVENOUS
  Filled 2015-01-15 (×8): qty 50

## 2015-01-15 MED ORDER — FENTANYL CITRATE (PF) 100 MCG/2ML IJ SOLN
INTRAMUSCULAR | Status: AC
  Filled 2015-01-15: qty 2

## 2015-01-15 MED ORDER — MAGNESIUM SULFATE 2 GM/50ML IV SOLN
2.0000 g | Freq: Once | INTRAVENOUS | Status: AC
  Administered 2015-01-15: 2 g via INTRAVENOUS
  Filled 2015-01-15: qty 50

## 2015-01-15 MED ORDER — FUROSEMIDE 10 MG/ML IJ SOLN
40.0000 mg | Freq: Four times a day (QID) | INTRAMUSCULAR | Status: AC
  Administered 2015-01-15 (×3): 40 mg via INTRAVENOUS
  Filled 2015-01-15 (×3): qty 4

## 2015-01-15 MED ORDER — POTASSIUM CHLORIDE 20 MEQ/15ML (10%) PO SOLN
40.0000 meq | Freq: Three times a day (TID) | ORAL | Status: AC
  Administered 2015-01-15 (×2): 40 meq
  Filled 2015-01-15 (×2): qty 30

## 2015-01-15 NOTE — Progress Notes (Signed)
Spoke with family at length, they would like to wait for additional family who will be arriving Friday night for withdrawal on Saturday morning, in the meantime, full DNR.  Alyson Reedy, M.D. [facility] Pulmonary/Critical Care Medicine. Pager: [phone]. After hours pager: [phone].

## 2015-01-15 NOTE — Progress Notes (Signed)
eLink Physician-Brief Progress Note Patient Name: Krista Wise DOB: 30-Jul-1945 MRN: [id number]   Date of Service  01/15/2015  HPI/Events of Note  Coughing, choking, vent dysynchrony  eICU Interventions  Fentanyl prn     Intervention Category Intermediate Interventions: Respiratory distress - evaluation and management  [name] 01/15/2015, 9:20 PM

## 2015-01-15 NOTE — Care Management Note (Signed)
Case Management Note  Patient Details  Name: [name] MRN: [id number] Date of Birth: 1945-10-26  Subjective/Objective:      Pt with severe hypoglycemic brain injury; no evidence of improvement noted.                Action/Plan: Family has decided to offer comfort care; plan for terminal wean on Saturdayafter family arrives in town on Friday evening.   Expected Discharge Date:                  Expected Discharge Plan:  Expired  In-House Referral:  Clinical Social Work  Discharge planning Services  CM Consult  Post Acute Care Choice:    Choice offered to:     DME Arranged:    DME Agency:     HH Arranged:    HH Agency:     Status of Service:  In process, will continue to follow  Medicare Important Message Given:    Date Medicare IM Given:    Medicare IM give by:    Date Additional Medicare IM Given:    Additional Medicare Important Message give by:     If discussed at Long Length of Stay Meetings, dates discussed:    Additional Comments:  Quintella Baton, RN, BSN  Trauma/Neuro ICU Case Manager [phone]

## 2015-01-15 NOTE — Progress Notes (Addendum)
PULMONARY / CRITICAL CARE MEDICINE   Name: Krista Wise MRN: [id number] DOB: 05-Aug-1945    ADMISSION DATE:  01/12/2015 CONSULTATION DATE:  01/09/2015  REFERRING MD :  Lynelle Doctor, MD  CHIEF COMPLAINT:  Change in MS  INITIAL PRESENTATION:  58 F admitted 6/19 to ICU/PCCM service with profound hypoglycemia and AMS requiring intubation  MAJOR EVENTS/TEST RESULTS: 6/19 CT head: NAICP 6/20 MRI brain: Abnormal restricted diffusion involving the splenium of the corpus callosum, with question of more subtle diffusion and T2/FLAIR abnormality within the bilateral occipital regions. Finding is nonspecific, but can be seen in the setting of hypoglycemic brain injury. Possible cerebral ischemia, posterior reversible encephalopathy syndrome (PRES), or encephalitis, either infectious or inflammatory, could also be considered, although these are felt to be less likely 6/20 EEG: This is an abnormal electroencephalogram secondary to burst-suppression activity which is can be seen as a medication effect. Triphasic activity is noted as well consistent with an encephalopathy, etiology nonspecific  INDWELLING DEVICES:: ETT 6/19 >>   MICRO DATA: MRSA 6/19 >> NEG Resp 6/20 >>  Blood 6/20 >>   ANTIMICROBIALS:  Zosyn 6/19 >>    SUBJECTIVE:  Comatose, posturing with sternal rub, no events overnight.  VITAL SIGNS: Temp:  [99.5 F (37.5 C)-101.3 F (38.5 C)] 99.7 F (37.6 C) (06/23 1000) Pulse Rate:  [70-92] 75 (06/23 1000) Resp:  [11-26] 22 (06/23 1000) BP: (109-132)/(47-87) 129/63 mmHg (06/23 1000) SpO2:  [97 %-100 %] 98 % (06/23 1000) FiO2 (%):  [40 %] 40 % (06/23 0810)   HEMODYNAMICS:   VENTILATOR SETTINGS: Vent Mode:  [-] PRVC FiO2 (%):  [40 %] 40 % Set Rate:  [18 bmp] 18 bmp Vt Set:  [450 mL] 450 mL PEEP:  [5 cmH20] 5 cmH20 Pressure Support:  [5 cmH20] 5 cmH20 Plateau Pressure:  [19 cmH20] 19 cmH20   INTAKE / OUTPUT:  Intake/Output Summary (Last 24 hours) at 01/15/15 1113 Last data  filed at 01/15/15 1000  Gross per 24 hour  Intake 1367.16 ml  Output   3020 ml  Net -1652.84 ml    PHYSICAL EXAMINATION: General:  Completely unresponsive.  Neuro: Comatose, +/- decerebrate posturing, incomplete oculocephalic reflexes, not withdrawing to pain. HEENT:NCAT, PERRL (sluggish) and EOM is spontaneous and none purposeful. Cardiovascular: reg, no M Lungs: coarse BS diffusely. Abdomen: soft, +BS Ext: symmetric edema  LABS:  CBC  Recent Labs Lab 01/13/15 0216 01/14/15 0925 01/15/15 0225  WBC 10.2 10.7* 9.1  HGB 9.5* 9.1* 9.2*  HCT 31.2* 29.9* 30.8*  PLT 205 212 211   Coag's  Recent Labs Lab 01/18/2015 2043  APTT 31  INR 1.17   BMET  Recent Labs Lab 01/13/15 0216 01/14/15 0925 01/15/15 0225  NA 136 141 142  K 3.8 4.0 3.6  CL 104 106 107  CO2 23 24 24   BUN 50* 56* 62*  CREATININE 1.98* 1.78* 1.67*  GLUCOSE 206* 163* 147*   Electrolytes  Recent Labs Lab 01/12/15 0810 01/13/15 0216 01/14/15 0925 01/15/15 0225  CALCIUM 8.1* 8.0* 8.6* 8.7*  MG 1.4*  --  1.5* 1.5*  PHOS 4.8*  --  4.1 3.9   Sepsis Markers  Recent Labs Lab 01/01/2015 2053 01/12/15 1017 01/13/15 0216 01/14/15 0500  LATICACIDVEN 0.89  --   --   --   PROCALCITON  --  <0.10 0.13 0.45   ABG  Recent Labs Lab 01/12/15 0408 01/14/15 0927 01/15/15 0612  PHART 7.355 7.387 7.388  PCO2ART 42.8 40.3 42.8  PO2ART 91.5 119* 106*  Liver Enzymes  Recent Labs Lab 01-18-2015 2043 01/12/15 0352  AST 14* 17  ALT 9* 10*  ALKPHOS 61 65  BILITOT 0.5 0.5  ALBUMIN 2.3* 2.7*   Cardiac Enzymes  Recent Labs Lab 01/12/15 0352 01/12/15 0810  TROPONINI 0.11* 0.10*   Glucose  Recent Labs Lab 01/14/15 1145 01/14/15 1619 01/14/15 1932 01/15/15 0020 01/15/15 0403 01/15/15 0803  GLUCAP 185* 160* 151* 163* 149* 207*   CXR: I reviewed myself, edema pattern, ETT ok.  ASSESSMENT / PLAN:  PULMONARY A: ETT 6/19>>> Acute resp failure due to AMS OHS/OSA Pulm edema, mild P:    PS but no extubation given mental status. Family coming in will need to discuss plan of care as I see no evidence of improvement but would like to speak to neuro prior to meeting. Cont vent bundle. Follow CXR and ABG.  CARDIOVASCULAR A:  Hypertension stable. P:  Monitor for now.  RENAL A:   AKI CKD P:   Monitor BMET intermittently Monitor I/Os Correct electrolytes as indicated Lasix 40 mg IV q6 x3 doses. Potassium and Mag replacement as ordered.  GASTROINTESTINAL A:   No issues P:   SUP: enteral famotidine. Continue TF, increased to goal.  HEMATOLOGIC A:   No issues P:  DVT px: SQ heparin. Monitor CBC intermittently. Transfuse per usual ICU guidelines.  INFECTIOUS A:   Doubt PNA MSSA in tracheal aspirate PCT in 6/21 0.13, afebrile and WBC is normal. P:   Monitor temp, WBC count Micro and abx as above D/Ced zosyn, monitor off abx. D/C vanc. Start Ancef 6/23>>>  ENDOCRINE A:   DM 2 Hypoglycemia, resolved P:   Holding home meds. Cont to monitor CBG. Sensitive ISS ordered.  NEUROLOGIC A: Severe hypoglycemic brain injury Coma EEG with burst suppression. MRI with persistent diffusion restriction consistent with hypoglycemic injury, small petechial hemorrhage at the occipital infarct sight and EEG consistent with encephalopathy, diffuse slowing. P:   RASS goal: 0 Neurology following. Will discuss with neuro and meet with family to discuss prognosis.  FAMILY updates: Family meeting today.  The patient is critically ill with multiple organ systems failure and requires high complexity decision making for assessment and support, frequent evaluation and titration of therapies, application of advanced monitoring technologies and extensive interpretation of multiple databases.   Critical Care Time devoted to patient care services described in this note is  35  Minutes. This time reflects time of care of this signee Dr Koren Bound. This critical care time  does not reflect procedure time, or teaching time or supervisory time of PA/NP/Med student/Med Resident etc but could involve care discussion time.  Alyson Reedy, M.D. [facility] Pulmonary/Critical Care Medicine. Pager: [phone]. After hours pager: [phone].  01/15/2015, 11:13 AM

## 2015-01-16 ENCOUNTER — Inpatient Hospital Stay (HOSPITAL_COMMUNITY): Payer: Medicare Other

## 2015-01-16 LAB — BLOOD GAS, ARTERIAL
ACID-BASE EXCESS: 0.8 mmol/L (ref 0.0–2.0)
Acid-Base Excess: 1.5 mmol/L (ref 0.0–2.0)
BICARBONATE: 25.2 meq/L — AB (ref 20.0–24.0)
Bicarbonate: 26.1 mEq/L — ABNORMAL HIGH (ref 20.0–24.0)
DRAWN BY: 42624
Drawn by: 404151
FIO2: 0.4 %
FIO2: 0.4 %
MECHVT: 450 mL
MECHVT: 450 mL
O2 SAT: 98.2 %
O2 Saturation: 98.7 %
PATIENT TEMPERATURE: 98.6
PCO2 ART: 45.9 mmHg — AB (ref 35.0–45.0)
PEEP: 5 cmH2O
PEEP: 5 cmH2O
PH ART: 7.374 (ref 7.350–7.450)
PO2 ART: 115 mmHg — AB (ref 80.0–100.0)
Patient temperature: 101.3
RATE: 18 resp/min
RATE: 18 resp/min
TCO2: 26.5 mmol/L (ref 0–100)
TCO2: 27.5 mmol/L (ref 0–100)
pCO2 arterial: 46.1 mmHg — ABNORMAL HIGH (ref 35.0–45.0)
pH, Arterial: 7.366 (ref 7.350–7.450)
pO2, Arterial: 141 mmHg — ABNORMAL HIGH (ref 80.0–100.0)

## 2015-01-16 LAB — BASIC METABOLIC PANEL
Anion gap: 10 (ref 5–15)
BUN: 66 mg/dL — ABNORMAL HIGH (ref 6–20)
CO2: 26 mmol/L (ref 22–32)
Calcium: 8.7 mg/dL — ABNORMAL LOW (ref 8.9–10.3)
Chloride: 109 mmol/L (ref 101–111)
Creatinine, Ser: 1.63 mg/dL — ABNORMAL HIGH (ref 0.44–1.00)
GFR, EST AFRICAN AMERICAN: 37 mL/min — AB (ref >60)
GFR, EST NON AFRICAN AMERICAN: 32 mL/min — AB (ref >60)
GLUCOSE: 202 mg/dL — AB (ref 65–99)
POTASSIUM: 4.1 mmol/L (ref 3.5–5.1)
SODIUM: 145 mmol/L (ref 135–145)

## 2015-01-16 LAB — GLUCOSE, CAPILLARY
GLUCOSE-CAPILLARY: 175 mg/dL — AB (ref 65–99)
GLUCOSE-CAPILLARY: 235 mg/dL — AB (ref 65–99)
GLUCOSE-CAPILLARY: 220 mg/dL — AB (ref 65–99)
Glucose-Capillary: 194 mg/dL — ABNORMAL HIGH (ref 65–99)
Glucose-Capillary: 230 mg/dL — ABNORMAL HIGH (ref 65–99)
Glucose-Capillary: 206 mg/dL — ABNORMAL HIGH (ref 65–99)

## 2015-01-16 LAB — CBC
HCT: 31.6 % — ABNORMAL LOW (ref 36.0–46.0)
HEMOGLOBIN: 9.3 g/dL — AB (ref 12.0–15.0)
MCH: 26 pg (ref 26.0–34.0)
MCHC: 29.4 g/dL — ABNORMAL LOW (ref 30.0–36.0)
MCV: 88.3 fL (ref 78.0–100.0)
Platelets: 177 10*3/uL (ref 150–400)
RBC: 3.58 MIL/uL — ABNORMAL LOW (ref 3.87–5.11)
RDW: 17.3 % — ABNORMAL HIGH (ref 11.5–15.5)
WBC: 7.2 10*3/uL (ref 4.0–10.5)

## 2015-01-16 LAB — MAGNESIUM: Magnesium: 2 mg/dL (ref 1.7–2.4)

## 2015-01-16 LAB — PHOSPHORUS: PHOSPHORUS: 3.6 mg/dL (ref 2.5–4.6)

## 2015-01-16 NOTE — Progress Notes (Signed)
PULMONARY / CRITICAL CARE MEDICINE   Name: NNENNA MEADOR MRN: [id number] DOB: 10/03/1945    ADMISSION DATE:  2015/01/22 CONSULTATION DATE:  01/22/15  REFERRING MD :  Lynelle Doctor, MD  CHIEF COMPLAINT:  Change in MS  INITIAL PRESENTATION:  63 F admitted 6/19 to ICU/PCCM service with profound hypoglycemia and AMS requiring intubation  MAJOR EVENTS/TEST RESULTS: 6/19 CT head: NAICP 6/20 MRI brain: Abnormal restricted diffusion involving the splenium of the corpus callosum, with question of more subtle diffusion and T2/FLAIR abnormality within the bilateral occipital regions. Finding is nonspecific, but can be seen in the setting of hypoglycemic brain injury. Possible cerebral ischemia, posterior reversible encephalopathy syndrome (PRES), or encephalitis, either infectious or inflammatory, could also be considered, although these are felt to be less likely 6/20 EEG: This is an abnormal electroencephalogram secondary to burst-suppression activity which is can be seen as a medication effect. Triphasic activity is noted as well consistent with an encephalopathy, etiology nonspecific  INDWELLING DEVICES:: ETT 6/19 >>   MICRO DATA: MRSA 6/19 >> NEG Resp 6/20 >>  Blood 6/20 >>   ANTIMICROBIALS:  Zosyn 6/19 >>    SUBJECTIVE:  Comatose, posturing with sternal rub, no events overnight.  VITAL SIGNS: Temp:  [98.6 F (37 C)-101.1 F (38.4 C)] 99.3 F (37.4 C) (06/24 0600) Pulse Rate:  [61-91] 86 (06/24 0751) Resp:  [15-24] 21 (06/24 0751) BP: (104-140)/(48-110) 131/61 mmHg (06/24 0751) SpO2:  [94 %-100 %] 100 % (06/24 0751) FiO2 (%):  [40 %] 40 % (06/24 0751)   HEMODYNAMICS:   VENTILATOR SETTINGS: Vent Mode:  [-] PRVC FiO2 (%):  [40 %] 40 % Set Rate:  [18 bmp] 18 bmp Vt Set:  [450 mL] 450 mL PEEP:  [5 cmH20] 5 cmH20 Pressure Support:  [5 cmH20] 5 cmH20 Plateau Pressure:  [15 cmH20-20 cmH20] 19 cmH20   INTAKE / OUTPUT:  Intake/Output Summary (Last 24 hours) at 01/16/15  1018 Last data filed at 01/16/15 0600  Gross per 24 hour  Intake   1000 ml  Output   1975 ml  Net   -975 ml    PHYSICAL EXAMINATION: General:  Completely unresponsive.  Neuro: Comatose, +/- decerebrate posturing, incomplete oculocephalic reflexes, not withdrawing to pain. HEENT:NCAT, PERRL (sluggish) and EOM is spontaneous and none purposeful. Cardiovascular: reg, no M Lungs: coarse BS diffusely. Abdomen: soft, +BS Ext: symmetric edema  LABS:  CBC  Recent Labs Lab 01/14/15 0925 01/15/15 0225 01/16/15 0321  WBC 10.7* 9.1 7.2  HGB 9.1* 9.2* 9.3*  HCT 29.9* 30.8* 31.6*  PLT 212 211 177   Coag's  Recent Labs Lab 01/22/2015 2043  APTT 31  INR 1.17   BMET  Recent Labs Lab 01/14/15 0925 01/15/15 0225 01/16/15 0321  NA 141 142 145  K 4.0 3.6 4.1  CL 106 107 109  CO2 BUN 56* 62* 66*  CREATININE 1.78* 1.67* 1.63*  GLUCOSE 163* 147* 202*   Electrolytes  Recent Labs Lab 01/14/15 0925 01/15/15 0225 01/16/15 0321  CALCIUM 8.6* 8.7* 8.7*  MG 1.5* 1.5* 2.0  PHOS 4.1 3.9 3.6   Sepsis Markers  Recent Labs Lab 01-22-2015 2053 01/12/15 1017 01/13/15 0216 01/14/15 0500  LATICACIDVEN 0.89  --   --   --   PROCALCITON  --  <0.10 0.13 0.45   ABG  Recent Labs Lab 01/14/15 0927 01/15/15 0612 01/16/15 0404  PHART 7.387 7.388 7.374  PCO2ART 40.3 42.8 45.9*  PO2ART 119* 106* 141*   Liver Enzymes  Recent  Labs Lab 01/12/2015 2043 01/12/15 0352  AST 14* 17  ALT 9* 10*  ALKPHOS 61 65  BILITOT 0.5 0.5  ALBUMIN 2.3* 2.7*   Cardiac Enzymes  Recent Labs Lab 01/12/15 0352 01/12/15 0810  TROPONINI 0.11* 0.10*   Glucose  Recent Labs Lab 01/15/15 1134 01/15/15 1602 01/15/15 1942 01/15/15 2310 01/16/15 0341 01/16/15 0800  GLUCAP 184* 186* 228* 218* 194* 175*   CXR: I reviewed myself, edema pattern, ETT ok.  ASSESSMENT / PLAN:  PULMONARY A: ETT 6/19>>> Acute resp failure due to AMS OHS/OSA Pulm edema, mild P:   Full vent  support. Withdrawal in AM. Follow CXR and ABG.  CARDIOVASCULAR A:  Hypertension stable. P:  Monitor for now.  RENAL A:   AKI CKD P:   D/C blood draws at this point.  GASTROINTESTINAL A:   No issues P:   SUP: enteral famotidine. Continue TF, increased to goal.  HEMATOLOGIC A:   No issues P:  DVT px: SQ heparin. D/C further blood draws.  INFECTIOUS A:   Doubt PNA MSSA in tracheal aspirate PCT in 6/21 0.13, afebrile and WBC is normal. P:   Monitor temp, WBC count Micro and abx as above D/Ced zosyn, monitor off abx. D/C vanc. Start Ancef 6/23>>> Will d/c abx in AM.  ENDOCRINE A:   DM 2 Hypoglycemia, resolved P:   Holding home meds. Cont to monitor CBG. Sensitive ISS ordered.  NEUROLOGIC A: Severe hypoglycemic brain injury Coma EEG with burst suppression. MRI with persistent diffusion restriction consistent with hypoglycemic injury, small petechial hemorrhage at the occipital infarct sight and EEG consistent with encephalopathy, diffuse slowing. P:   RASS goal: 0 Neurology following. Morphine for comfort in AM  FAMILY updates: Full withdrawal in AM.  The patient is critically ill with multiple organ systems failure and requires high complexity decision making for assessment and support, frequent evaluation and titration of therapies, application of advanced monitoring technologies and extensive interpretation of multiple databases.   Critical Care Time devoted to patient care services described in this note is  35  Minutes. This time reflects time of care of this signee Dr Koren Bound. This critical care time does not reflect procedure time, or teaching time or supervisory time of PA/NP/Med student/Med Resident etc but could involve care discussion time.  Alyson Reedy, M.D. [facility] Pulmonary/Critical Care Medicine. Pager: [phone]. After hours pager: [phone].  01/16/2015, 10:18 AM

## 2015-01-17 LAB — CULTURE, BLOOD (ROUTINE X 2)
Culture: NO GROWTH
Culture: NO GROWTH

## 2015-01-17 LAB — GLUCOSE, CAPILLARY
GLUCOSE-CAPILLARY: 201 mg/dL — AB (ref 65–99)
Glucose-Capillary: 186 mg/dL — ABNORMAL HIGH (ref 65–99)

## 2015-01-17 MED ORDER — MORPHINE BOLUS VIA INFUSION
5.0000 mg | INTRAVENOUS | Status: DC | PRN
  Filled 2015-01-17: qty 20

## 2015-01-17 MED ORDER — SCOPOLAMINE 1 MG/3DAYS TD PT72
1.0000 | MEDICATED_PATCH | TRANSDERMAL | Status: DC
Start: 2015-01-17 — End: 2015-01-17
  Administered 2015-01-17: 1.5 mg via TRANSDERMAL
  Filled 2015-01-17: qty 1

## 2015-01-17 MED ORDER — DEXTROSE 5 % IV SOLN
10.0000 mg/h | INTRAVENOUS | Status: DC
  Administered 2015-01-17: 10 mg/h via INTRAVENOUS
  Filled 2015-01-17: qty 10

## 2015-01-20 ENCOUNTER — Telehealth: Payer: Self-pay

## 2015-01-20 NOTE — Telephone Encounter (Signed)
On 01/20/2015 I received a death certificate from [employer] [facility] [employer] [employer] [facility] [employer]. The death certificate is for burial. This patient is a patient of Doctor Molli KnockYacoub. The [facility] ([id number] for signature either this am or pm. On 01/20/2015 I received the death certificate back from Doctor Molli KnockYacoub. I got the death certificate ready for pickup. I called the funeral home to let them know the death certificate was ready for pickup.

## 2015-01-23 NOTE — Progress Notes (Signed)
PULMONARY / CRITICAL CARE MEDICINE   Name: Krista Wise MRN: [id number] DOB: 27-Jan-1946    ADMISSION DATE:  02-04-15 CONSULTATION DATE:  February 04, 2015  REFERRING MD :  Lynelle Doctor, MD  CHIEF COMPLAINT:  Change in MS  INITIAL PRESENTATION:  24 F admitted 6/19 to ICU/PCCM service with profound hypoglycemia and AMS requiring intubation  MAJOR EVENTS/TEST RESULTS: 6/19 CT head: NAICP 6/20 MRI brain: Abnormal restricted diffusion involving the splenium of the corpus callosum, with question of more subtle diffusion and T2/FLAIR abnormality within the bilateral occipital regions. Finding is nonspecific, but can be seen in the setting of hypoglycemic brain injury. Possible cerebral ischemia, posterior reversible encephalopathy syndrome (PRES), or encephalitis, either infectious or inflammatory, could also be considered, although these are felt to be less likely 6/20 EEG: This is an abnormal electroencephalogram secondary to burst-suppression activity which is can be seen as a medication effect. Triphasic activity is noted as well consistent with an encephalopathy, etiology nonspecific  INDWELLING DEVICES:: ETT 6/19 >>   MICRO DATA: MRSA 6/19 >> NEG Resp 6/20 >>  Blood 6/20 >>   ANTIMICROBIALS:  Zosyn 6/19 >>    SUBJECTIVE:  Comatose, posturing with sternal rub, no events overnight.  VITAL SIGNS: Temp:  [98.6 F (37 C)-99.9 F (37.7 C)] 99.7 F (37.6 C) (06/25 0900) Pulse Rate:  [62-85] 65 (06/25 0900) Resp:  [15-23] 19 (06/25 0900) BP: (105-139)/(39-74) 117/43 mmHg (06/25 0900) SpO2:  [95 %-99 %] 96 % (06/25 0900) FiO2 (%):  [40 %] 40 % (06/25 0900)   HEMODYNAMICS:   VENTILATOR SETTINGS: Vent Mode:  [-] PRVC FiO2 (%):  [40 %] 40 % Set Rate:  [18 bmp] 18 bmp Vt Set:  [450 mL] 450 mL PEEP:  [5 cmH20] 5 cmH20 Plateau Pressure:  [17 cmH20-19 cmH20] 19 cmH20   INTAKE / OUTPUT:  Intake/Output Summary (Last 24 hours) at 12/31/2014 1013 Last data filed at 12/27/2014 0900  Gross per  24 hour  Intake   1365 ml  Output   1470 ml  Net   -105 ml    PHYSICAL EXAMINATION: General:  Completely unresponsive.  Neuro: Comatose, +/- decerebrate posturing, incomplete oculocephalic reflexes, not withdrawing to pain. HEENT:NCAT, PERRL (sluggish) and EOM is spontaneous and none purposeful. Cardiovascular: reg, no M Lungs: coarse BS diffusely. Abdomen: soft, +BS Ext: symmetric edema  LABS:  CBC  Recent Labs Lab 01/14/15 0925 01/15/15 0225 01/16/15 0321  WBC 10.7* 9.1 7.2  HGB 9.1* 9.2* 9.3*  HCT 29.9* 30.8* 31.6*  PLT 212 211 177   Coag's  Recent Labs Lab 02/04/2015 2043  APTT 31  INR 1.17   BMET  Recent Labs Lab 01/14/15 0925 01/15/15 0225 01/16/15 0321  NA 141 142 145  K 4.0 3.6 4.1  CL 106 107 109  CO2 BUN 56* 62* 66*  CREATININE 1.78* 1.67* 1.63*  GLUCOSE 163* 147* 202*   Electrolytes  Recent Labs Lab 01/14/15 0925 01/15/15 0225 01/16/15 0321  CALCIUM 8.6* 8.7* 8.7*  MG 1.5* 1.5* 2.0  PHOS 4.1 3.9 3.6   Sepsis Markers  Recent Labs Lab 02-04-2015 2053 01/12/15 1017 01/13/15 0216 01/14/15 0500  LATICACIDVEN 0.89  --   --   --   PROCALCITON  --  <0.10 0.13 0.45   ABG  Recent Labs Lab 01/14/15 0927 01/15/15 0612 01/16/15 0404  PHART 7.387 7.366 7.374  PCO2ART 40.3 46.1* 45.9*  PO2ART 119* 115* 141*   Liver Enzymes  Recent Labs Lab 04-Feb-2015 2043 01/12/15 0352  AST 14* 17  ALT 9* 10*  ALKPHOS 61 65  BILITOT 0.5 0.5  ALBUMIN 2.3* 2.7*   Cardiac Enzymes  Recent Labs Lab 01/12/15 0352 01/12/15 0810  TROPONINI 0.11* 0.10*   Glucose  Recent Labs Lab 01/16/15 1123 01/16/15 1631 01/16/15 1923 01/16/15 2315 02/07/2015 0318 02/07/2015 0842  GLUCAP 235* 220* 230* 206* 201* 186*   CXR: I reviewed myself, edema pattern, ETT ok.  ASSESSMENT / PLAN:  PULMONARY A: ETT 6/19>>> Acute resp failure due to AMS OHS/OSA Pulm edema, mild P:   Terminal extubation today. Morphine drip.  CARDIOVASCULAR A:   Hypertension stable. P:  Monitor for now.  RENAL A:   AKI CKD P:   D/C blood draws at this point.  GASTROINTESTINAL A:   No issues P:   SUP: enteral famotidine. Continue TF, increased to goal.  HEMATOLOGIC A:   No issues P:  DVT px: SQ heparin. D/C further blood draws.  INFECTIOUS A:   Doubt PNA MSSA in tracheal aspirate PCT in 6/21 0.13, afebrile and WBC is normal. P:   D/C abx and monitoring.  ENDOCRINE A:   DM 2 Hypoglycemia, resolved P:   D/C CBGs. D/C ISS.  NEUROLOGIC A: Severe hypoglycemic brain injury Coma EEG with burst suppression. MRI with persistent diffusion restriction consistent with hypoglycemic injury, small petechial hemorrhage at the occipital infarct sight and EEG consistent with encephalopathy, diffuse slowing. P:   Morphine for comfort.  FAMILY updates: Full withdrawal today.  Discussed with the entire family bedside, all questions answered, ready for withdrawal.  The patient is critically ill with multiple organ systems failure and requires high complexity decision making for assessment and support, frequent evaluation and titration of therapies, application of advanced monitoring technologies and extensive interpretation of multiple databases.   Critical Care Time devoted to patient care services described in this note is  35  Minutes. This time reflects time of care of this signee Dr Koren Bound. This critical care time does not reflect procedure time, or teaching time or supervisory time of PA/NP/Med student/Med Resident etc but could involve care discussion time.  Alyson Reedy, M.D. [facility] Pulmonary/Critical Care Medicine. Pager: [phone]. After hours pager: [phone].  02/07/2015, 10:13 AM

## 2015-01-23 NOTE — Progress Notes (Signed)
Chaplain responded to page requesting family support for withdrawal of care for pt.  Chaplain made introduction to family and offered support.  Large family present, some were crying at bedside.  Family will request chaplain if further needs arise.  Chaplain available to follow up as needed.    01/13/2015 1000  Clinical Encounter Type  Visited With Patient and family together;Health care provider  Visit Type Initial;Spiritual support;Patient actively dying  Referral From Nurse  Spiritual Encounters  Spiritual Needs Emotional;Grief support  Stress Factors  Family Stress Factors Loss   Blain Pais 01/21/2015 10:38 AM

## 2015-01-23 NOTE — Progress Notes (Addendum)
Md and Rn explained and  answered all questions from family about the withdrawal process. Family was tearful but was grateful for all 37M has done for them.  E-link, chaplin and CDS notified. Pt comfortable on analgesia prior to extubation. Pt extubated by RT and Rn assistance. Family at bedside. Will continue to keep patient comfortable and be there for the family.

## 2015-01-23 NOTE — Progress Notes (Signed)
Pt passed peacefully at 1212 with husband, family and RN at bedside. Patient pronounced by Docia Barrier, RN and Jani Files, RN at [phone], also confirmed by ECG. No belongings were at bedside. Husband and family thanked 30M for everything they have done.

## 2015-01-23 NOTE — Procedures (Signed)
Extubation Procedure Note  Patient Details:   Name: Krista Wise DOB: [birth year] MRN: [id number]   Airway Documentation:   Pt extubated per MD order, pt now comfort care    Cherylin Mylar 12/30/2014, 10:50 AM

## 2015-01-23 DEATH — deceased

## 2015-02-03 NOTE — Discharge Summary (Addendum)
[name]:  [name], [name]               ACCOUNT NO.:  [id number]  MEDICAL RECORD NO.:  [id number]  LOCATION:  [id number]                        FACILITY:  [facility]  PHYSICIAN:  Felipa EvenerWesam Jake Yacoub, MD  DATE OF BIRTH:  Dec 05, 1945  DATE OF ADMISSION:  12/25/2014 DATE OF DISCHARGE:  2015-03-08                              DISCHARGE SUMMARY   PRIMARY DIAGNOSIS/CAUSE OF DEATH:  Hypoglycemic encephalopathy.  SECONDARY DIAGNOSES:  Vent dependent respiratory failure; obstructive sleep apnea; acute pulmonary edema; hypertension; acute on chronic kidney disease; MSSA tracheitis; diabetes; coma; acute grade 2 diastolic heart failure, stage 3 CKD and intracranial hemorrhage.  The patient is a 69 year old diabetic female, who was found unresponsive by family at home and was brought to the emergency department.  She was found to be severely hypoglycemic.  The patient was extubated for protection resuscitated appropriately.  The patient was admitted to the intensive care unit on 01/04/2015 at 11:00 a.m.  on 10/04/2014 after being seen by neurologist for the rest of the hospital stay, the patient failed to respond.  EEG showed burst suppression, __________ with Neurology again informing the patient is unlikely to survive this without significant debility, at which point I spoke with the family, informed of neurology's findings and recommendations.  They informed me that the patient would never want to live in such a debilitated state and remain on life support, as that is her only real option for survival at this point, given her severe anoxic encephalopathy.  At which point, a decision was made to make the patient a full DNR and to proceed with withdrawal of life supporting measures.  The patient was certainly not brain dead.  I recommended that we start morphine, then remove endotracheal tube __________ in regardless of the patient's progression or failure to progress.  Morphine was started and continued, was  removed and the patient expired 2 hours later, comfortable with the family at bedside.     Felipa EvenerWesam Jake Yacoub, MD     WJY/MEDQ  D:  02/02/2015  T:  02/03/2015  Job:  [id number]

## 2015-11-03 IMAGING — CR DG ABDOMEN 1V
1 series · 1 of 1 positions shown · non-contrast
Comparison: CT, 10/20/2014.

CLINICAL DATA: Pt states she has had abdominal pain for the last
few days. Pt had a regular BM this morning and is not constipated-
states the pain is unrelated to bowels.

EXAM:
ABDOMEN - 1 VIEW

[abdomen supine]
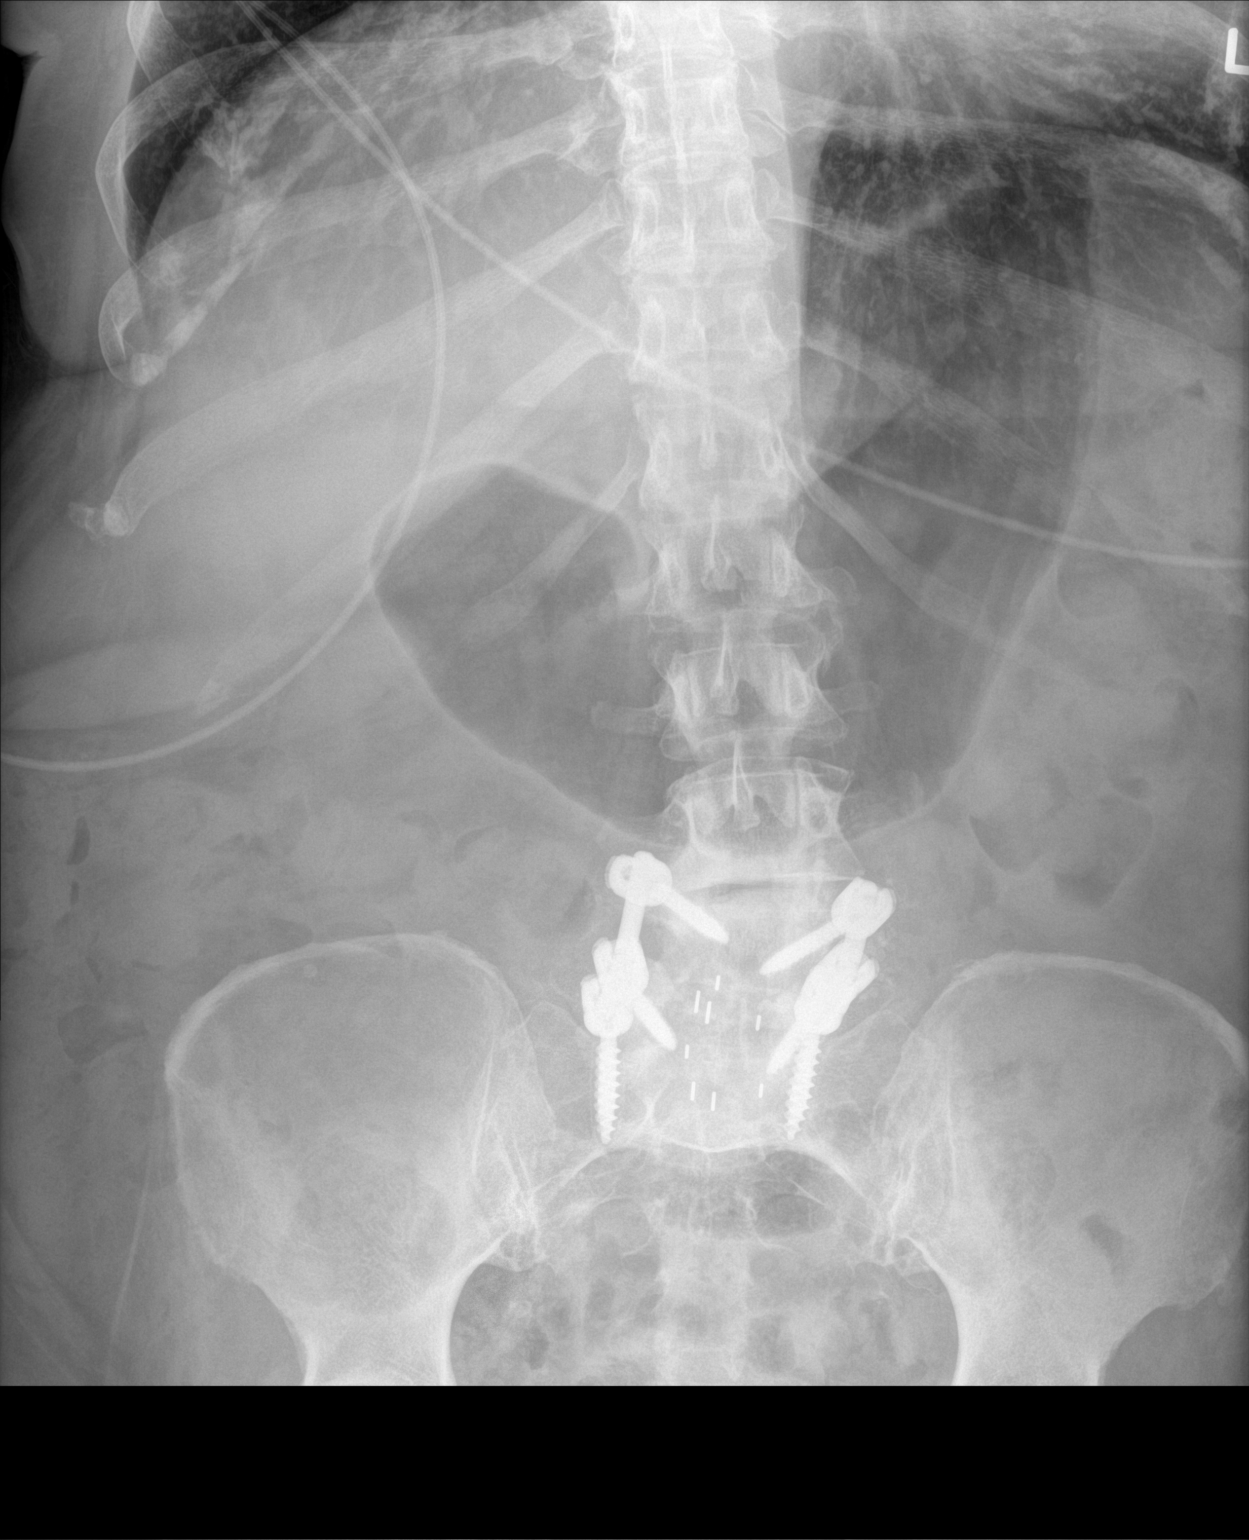

[1 of 1 positions shown; findings below may reference images not displayed]

FINDINGS: Moderate gaseous distention of the stomach. Normal bowel gas
pattern. No evidence of obstruction.

Soft tissues are unremarkable.

There stable changes from a lower lumbar spine posterior fusion.
IMPRESSION: 1. No acute findings.
2. No evidence of bowel obstruction or generalized adynamic ileus.

## 2017-03-28 ENCOUNTER — Telehealth: Payer: Self-pay | Admitting: Endocrinology

## 2017-04-21 NOTE — Telephone Encounter (Signed)
error
